# Patient Record
Sex: Male | Born: 1940
Health system: Southern US, Community
[De-identification: ages and names within clinical notes are randomized; demographics above are authoritative.]

## PROBLEM LIST (undated history)

## (undated) DIAGNOSIS — Z87442 Personal history of urinary calculi: Secondary | ICD-10-CM

## (undated) DIAGNOSIS — N189 Chronic kidney disease, unspecified: Secondary | ICD-10-CM

## (undated) DIAGNOSIS — Z5189 Encounter for other specified aftercare: Secondary | ICD-10-CM

## (undated) DIAGNOSIS — Z800 Family history of malignant neoplasm of digestive organs: Secondary | ICD-10-CM

## (undated) DIAGNOSIS — Z803 Family history of malignant neoplasm of breast: Secondary | ICD-10-CM

## (undated) DIAGNOSIS — D649 Anemia, unspecified: Secondary | ICD-10-CM

## (undated) DIAGNOSIS — Z806 Family history of leukemia: Secondary | ICD-10-CM

## (undated) DIAGNOSIS — Z809 Family history of malignant neoplasm, unspecified: Secondary | ICD-10-CM

## (undated) DIAGNOSIS — C189 Malignant neoplasm of colon, unspecified: Secondary | ICD-10-CM

## (undated) HISTORY — PX: TONSILLECTOMY: SHX5217

## (undated) HISTORY — DX: Malignant neoplasm of colon, unspecified: C18.9

## (undated) HISTORY — DX: Chronic kidney disease, unspecified: N18.9

## (undated) HISTORY — DX: Encounter for other specified aftercare: Z51.89

## (undated) HISTORY — PX: COLON SURGERY: SHX602

## (undated) HISTORY — PX: APPENDECTOMY: SHX54

## (undated) HISTORY — PX: CYST REMOVAL HAND: SHX6279

---
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
## 2019-02-05 ENCOUNTER — Telehealth: Payer: Self-pay | Admitting: Oncology

## 2019-02-05 NOTE — Telephone Encounter (Signed)
I received a staff msg for [name] to see Dr. Benay Spice. I cld and spoke to [name] granddaughter, Michiel Cowboy and scheduled him to see Dr. Benay Spice on 10/19 at 2pm. She's been made aware that her grandfather should arrive 76 minutes early.

## 2019-02-06 ENCOUNTER — Other Ambulatory Visit: Payer: Self-pay | Admitting: *Deleted

## 2019-02-06 ENCOUNTER — Ambulatory Visit
Admission: RE | Admit: 2019-02-06 | Discharge: 2019-02-06 | Disposition: A | Discharge status: Home / Self Care | Payer: Self-pay | Source: Ambulatory Visit | Attending: Oncology | Admitting: Oncology

## 2019-02-06 ENCOUNTER — Other Ambulatory Visit (HOSPITAL_COMMUNITY): Payer: Self-pay | Admitting: Oncology

## 2019-02-06 DIAGNOSIS — C801 Malignant (primary) neoplasm, unspecified: Secondary | ICD-10-CM

## 2019-02-06 DIAGNOSIS — C189 Malignant neoplasm of colon, unspecified: Secondary | ICD-10-CM

## 2019-02-06 NOTE — Progress Notes (Signed)
Took CD of her scan of abd/pelvis to radiology to be loaded into PACs system.

## 2019-02-10 ENCOUNTER — Inpatient Hospital Stay: Payer: Self-pay | Attending: Oncology | Admitting: Oncology

## 2019-02-10 ENCOUNTER — Other Ambulatory Visit: Payer: Self-pay

## 2019-02-10 ENCOUNTER — Telehealth: Payer: Self-pay | Admitting: Oncology

## 2019-02-10 ENCOUNTER — Inpatient Hospital Stay: Payer: Self-pay

## 2019-02-10 VITALS — BP 150/52 | HR 93 | Temp 98.7°F | Resp 17 | Ht 68.0 in | Wt 161.4 lb

## 2019-02-10 DIAGNOSIS — C187 Malignant neoplasm of sigmoid colon: Secondary | ICD-10-CM | POA: Insufficient documentation

## 2019-02-10 DIAGNOSIS — C189 Malignant neoplasm of colon, unspecified: Secondary | ICD-10-CM

## 2019-02-10 DIAGNOSIS — D509 Iron deficiency anemia, unspecified: Secondary | ICD-10-CM | POA: Insufficient documentation

## 2019-02-10 DIAGNOSIS — Z23 Encounter for immunization: Secondary | ICD-10-CM | POA: Insufficient documentation

## 2019-02-10 DIAGNOSIS — N400 Enlarged prostate without lower urinary tract symptoms: Secondary | ICD-10-CM | POA: Insufficient documentation

## 2019-02-10 DIAGNOSIS — Z800 Family history of malignant neoplasm of digestive organs: Secondary | ICD-10-CM | POA: Insufficient documentation

## 2019-02-10 LAB — CBC WITH DIFFERENTIAL (CANCER CENTER ONLY)
Abs Immature Granulocytes: 0.03 10*3/uL (ref 0.00–0.07)
Basophils Absolute: 0 10*3/uL (ref 0.0–0.1)
Basophils Relative: 1 %
Eosinophils Absolute: 0.2 10*3/uL (ref 0.0–0.5)
Eosinophils Relative: 3 %
HCT: 26.3 % — ABNORMAL LOW (ref 39.0–52.0)
Hemoglobin: 8.1 g/dL — ABNORMAL LOW (ref 13.0–17.0)
Immature Granulocytes: 0 %
Lymphocytes Relative: 23 %
Lymphs Abs: 1.7 10*3/uL (ref 0.7–4.0)
MCH: 25.2 pg — ABNORMAL LOW (ref 26.0–34.0)
MCHC: 30.8 g/dL (ref 30.0–36.0)
MCV: 81.9 fL (ref 80.0–100.0)
Monocytes Absolute: 0.8 10*3/uL (ref 0.1–1.0)
Monocytes Relative: 11 %
Neutro Abs: 4.7 10*3/uL (ref 1.7–7.7)
Neutrophils Relative %: 62 %
Platelet Count: 337 10*3/uL (ref 150–400)
RBC: 3.21 MIL/uL — ABNORMAL LOW (ref 4.22–5.81)
RDW: 16.5 % — ABNORMAL HIGH (ref 11.5–15.5)
WBC Count: 7.5 10*3/uL (ref 4.0–10.5)
nRBC: 0 % (ref 0.0–0.2)

## 2019-02-10 MED ORDER — INFLUENZA VAC A&B SA ADJ QUAD 0.5 ML IM PRSY
PREFILLED_SYRINGE | INTRAMUSCULAR | Status: AC
  Filled 2019-02-10: qty 0.5

## 2019-02-10 MED ORDER — INFLUENZA VAC A&B SA ADJ QUAD 0.5 ML IM PRSY
0.5000 mL | PREFILLED_SYRINGE | Freq: Once | INTRAMUSCULAR | Status: AC
  Administered 2019-02-10: 0.5 mL via INTRAMUSCULAR

## 2019-02-10 NOTE — Telephone Encounter (Signed)
Scheduled per 10/19 los, patient received after visit summary and calender. °

## 2019-02-10 NOTE — Progress Notes (Signed)
[name]   Requesting MD: Dr. Angela Adam   [name] 78 y.o.  05/09/1940    Reason for Consult: Colon cancer   HPI: [name] is here today with his English-speaking son and a Spanish interpreter.  He has relocated to [facility] to live with his son.  He reports helping fatigue January of this year.  He initially saw him.  He was referred to gastroenterology and was found to have a tumor at the sigmoid colon with "90% "obstruction.  He underwent a left hemicolectomy on 12/06/2018.  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  No polyps.  The tumor measured 4 cm.  Tumor invaded into the superficial portion of the muscularis.  No tumor perforation.  No perineural or vascular invasion.  Resection margins negative.  CTs of the abdomen pelvis on 11/22/2018 revealed groundglass density at the lung bases.  Asymmetric area of enhancement of a 6 cm segment of sigmoid colon.  No liver lesions.  Enlarged prostate.  Right inguinal hernia. A chest x-ray was normal on 08/14/2018.  He reports the dyspnea and fatigue have resolved.  He was evaluated by Dr. Toney Rakes in [place].  Dr. Toney Rakes recommends adjuvant capecitabine.  The patient and his son indicate this recommendation was based on the low number of sampled lymph nodes.   Past medical history: None  Past surgical history: Tonsillectomy at age 73 Appendectomy at age 38  Medications: Reviewed  Allergies: No Known Allergies  Family history: His mother died of pancreas cancer at age 90.  A paternal aunt had colon cancer.  No other family history of cancer.  Social History:   He is a retired Civil engineer, contracting.  He lives with his son in [place].  His wife is a smoker.  He reports social alcohol use.  ROS:   Positives include: Exertional dyspnea and general "weakness "prior to surgery, intermittent rectal bleeding prior to surgery,  dry skin  A complete ROS was otherwise for lymph nodes were negative for metastatic carcinoma.  Physical Exam:  Blood pressure (!) 150/52, pulse 93, temperature 98.7 F (37.1 C), temperature source Temporal, resp. rate 17, height 5\' 8"  (1.727 m), weight 161 lb 6.4 oz (73.2 kg), SpO2 100 %.  HEENT: Neck without mass Lungs: Clear bilaterally Cardiac: Regular rate and rhythm Abdomen: No hepatosplenomegaly, no mass, nontender, healed midline incision GU: Testes without mass Vascular: No leg edema Lymph nodes: No cervical, supraclavicular, axillary, or inguinal nodes Neurologic: Alert and oriented Skin: No rash    LAB:  CBC  Lab Results  Component Value Date   WBC 7.5 02/10/2019   HGB 8.1 (L) 02/10/2019   HCT 26.3 (L) 02/10/2019   MCV 81.9 02/10/2019   PLT 337 02/10/2019   NEUTROABS 4.7 02/10/2019  11/14/2018: Hemoglobin 8, MCV 75.2 11/24/2018: Hemoglobin 7.5 hematocrit 29.5%     Imaging: As per HPI   Assessment/Plan:   1. Sigmoid colon cancer, T2N0, stage I, status post a left hemicolectomy on 12/06/2018  Tumor invasive superficial portion of the muscle ("internal third "), no tumor perforation, no vascular invasion or perineural invasion, negative resection margins, 0/4 lymph nodes 2. Microcytic anemia secondary to #1  3.   Family history of pancreas and colon cancer   Disposition:   [name] has been diagnosed with stage I colon cancer.  He is now greater than 9 weeks out from surgery.  I discussed the prognosis and adjuvant treatment options with  [name] and his son.  He has a good prognosis for a long-term disease-free survival.  The tumor does not have high risk features other than the limited number lymph nodes sampled.  I do not recommend adjuvant systemic therapy.    [name] is comfortable with observation.  He has persistent anemia.  We will recommend iron therapy and ask him to return for a CBC in approximately 3 weeks.  We checked the CEA today.   He will return for an office visit and CEA in 4 months.  His history is not suggestive of hereditary nonpolyposis colon cancer syndrome, but his family members are at increased risk of developing colorectal cancer and should receive appropriate screening.  I will discuss peripheral blood Testing for HNPCC with the genetics counselor.  Betsy Coder, MD  02/10/2019, 5:12 PM

## 2019-02-11 ENCOUNTER — Other Ambulatory Visit (HOSPITAL_COMMUNITY): Payer: Self-pay | Admitting: Oncology

## 2019-02-11 ENCOUNTER — Other Ambulatory Visit: Payer: Self-pay | Admitting: *Deleted

## 2019-02-11 ENCOUNTER — Inpatient Hospital Stay
Admission: RE | Admit: 2019-02-11 | Discharge: 2019-02-11 | Disposition: A | Discharge status: Home / Self Care | Payer: Self-pay | Source: Ambulatory Visit | Attending: Oncology | Admitting: Oncology

## 2019-02-11 ENCOUNTER — Telehealth: Payer: Self-pay | Admitting: *Deleted

## 2019-02-11 ENCOUNTER — Ambulatory Visit
Admission: RE | Admit: 2019-02-11 | Discharge: 2019-02-11 | Disposition: A | Discharge status: Home / Self Care | Payer: Self-pay | Source: Ambulatory Visit | Attending: Oncology | Admitting: Oncology

## 2019-02-11 DIAGNOSIS — C189 Malignant neoplasm of colon, unspecified: Secondary | ICD-10-CM

## 2019-02-11 DIAGNOSIS — C801 Malignant (primary) neoplasm, unspecified: Secondary | ICD-10-CM

## 2019-02-11 LAB — CEA (IN HOUSE-CHCC): CEA (CHCC-In House): 9.62 ng/mL — ABNORMAL HIGH (ref 0.00–5.00)

## 2019-02-11 NOTE — Telephone Encounter (Signed)
Notified son of elevated CEA and informed him we have no prior result to compare. MD wants repeat CEA and BMP in 1 week--if still elevated, will order CT scan. Son understands and agrees. Appointment scheduled for 10/27 at 2pm.

## 2019-02-11 NOTE — Telephone Encounter (Signed)
-----   Message from Ladell Pier, MD sent at 02/11/2019 11:29 AM EDT ----- Please call son, cea is high, repeat 1 week with bmet, if high again will order CTs

## 2019-02-12 ENCOUNTER — Encounter: Payer: Self-pay | Admitting: Oncology

## 2019-02-12 ENCOUNTER — Other Ambulatory Visit: Payer: Self-pay | Admitting: Oncology

## 2019-02-12 DIAGNOSIS — C189 Malignant neoplasm of colon, unspecified: Secondary | ICD-10-CM

## 2019-02-12 NOTE — Progress Notes (Signed)
Called patient's son Jacqulyn Bath after receiving message from [employer] regarding uninsured concerns.  Introduced myself as Arboriculturist and to explain financial assistance. Advised all uninsured patients receive a 56% discount for all services billed through [facility] and if he would like to apply for an additional discount, he would need to complete a [facility] FAA and provide supporting documents. Advised I could mail an application as well as provide information on [facility] to obtain a PCP and apply for orange card. He was very Patent attorney.  If a treatment plan is established as far as chemotherapy, I will reach out to them at that time to discuss any other available resources as well as refer to Rob for drug replacement.  My card will also be provided in the packet mailed.

## 2019-02-14 ENCOUNTER — Telehealth: Payer: Self-pay | Admitting: Genetic Counselor

## 2019-02-14 NOTE — Telephone Encounter (Signed)
Called patient regarding upcoming appointment with interpreter, left a voicemail.

## 2019-02-17 ENCOUNTER — Other Ambulatory Visit: Payer: Self-pay | Admitting: Genetic Counselor

## 2019-02-17 DIAGNOSIS — C189 Malignant neoplasm of colon, unspecified: Secondary | ICD-10-CM

## 2019-02-17 NOTE — Progress Notes (Signed)
g

## 2019-02-18 ENCOUNTER — Inpatient Hospital Stay: Payer: Self-pay

## 2019-02-18 ENCOUNTER — Inpatient Hospital Stay (HOSPITAL_BASED_OUTPATIENT_CLINIC_OR_DEPARTMENT_OTHER): Payer: Self-pay | Admitting: Genetic Counselor

## 2019-02-18 ENCOUNTER — Encounter: Payer: Self-pay | Admitting: Genetic Counselor

## 2019-02-18 ENCOUNTER — Other Ambulatory Visit: Payer: Self-pay

## 2019-02-18 DIAGNOSIS — Z806 Family history of leukemia: Secondary | ICD-10-CM

## 2019-02-18 DIAGNOSIS — Z800 Family history of malignant neoplasm of digestive organs: Secondary | ICD-10-CM | POA: Insufficient documentation

## 2019-02-18 DIAGNOSIS — Z809 Family history of malignant neoplasm, unspecified: Secondary | ICD-10-CM | POA: Insufficient documentation

## 2019-02-18 DIAGNOSIS — D649 Anemia, unspecified: Secondary | ICD-10-CM

## 2019-02-18 DIAGNOSIS — C187 Malignant neoplasm of sigmoid colon: Secondary | ICD-10-CM

## 2019-02-18 DIAGNOSIS — Z803 Family history of malignant neoplasm of breast: Secondary | ICD-10-CM

## 2019-02-18 DIAGNOSIS — Z1379 Encounter for other screening for genetic and chromosomal anomalies: Secondary | ICD-10-CM

## 2019-02-18 DIAGNOSIS — C189 Malignant neoplasm of colon, unspecified: Secondary | ICD-10-CM

## 2019-02-18 LAB — BASIC METABOLIC PANEL - CANCER CENTER ONLY
Anion gap: 7 (ref 5–15)
BUN: 21 mg/dL (ref 8–23)
CO2: 25 mmol/L (ref 22–32)
Calcium: 8.8 mg/dL — ABNORMAL LOW (ref 8.9–10.3)
Chloride: 108 mmol/L (ref 98–111)
Creatinine: 1.07 mg/dL (ref 0.61–1.24)
GFR, Est AFR Am: >60 mL/min (ref >60)
GFR, Estimated: >60 mL/min (ref >60)
Glucose, Bld: 90 mg/dL (ref 70–99)
Potassium: 4.5 mmol/L (ref 3.5–5.1)
Sodium: 140 mmol/L (ref 135–145)

## 2019-02-18 LAB — CEA (IN HOUSE-CHCC): CEA (CHCC-In House): 11.41 ng/mL — ABNORMAL HIGH (ref 0.00–5.00)

## 2019-02-18 NOTE — Progress Notes (Signed)
REFERRING PROVIDER: Ladell Pier, [address],  [place] [address]  PRIMARY PROVIDER:  Patient, [name]  PRIMARY REASON FOR VISIT:  1. Cancer of sigmoid colon ([place])   2. Family history of colon cancer   3. Family history of leukemia   4. Family history of breast cancer   5. Family history of cancer      HISTORY OF PRESENT ILLNESS:   John Vance, a 78 y.o. male, was seen for a [facility] cancer genetics consultation at the request of Dr. Benay Spice due to a personal history of colon cancer and a family history of leukemia, colon cancer, and breast cancer.  John Vance presents to clinic today with his son and with a Spanish interpreter to discuss the possibility of a hereditary predisposition to cancer, genetic testing, and to further clarify his future cancer risks, as well as potential cancer risks for family members.   In 2020, at the age of 82, John Vance was diagnosed with moderately differentiated adenocarcinoma of the sigmoid colon. The treatment plan included a left hemicolectomy on 12/06/2018.   CANCER HISTORY:  Oncology History  Cancer of sigmoid colon ([place])  02/10/2019 Initial Diagnosis   Cancer of sigmoid colon ([place])   02/10/2019 Cancer Staging   Staging form: Colon and Rectum, AJCC 8th Edition - Pathologic: Stage I (pT2, pN0, cM0) - Signed by Ladell Pier, MD on 02/10/2019     Past Medical History:  Diagnosis Date  . Family history of breast cancer   . Family history of cancer   . Family history of colon cancer   . Family history of leukemia      Social History   Socioeconomic History  . Marital status: Married    Spouse name: Not on file  . Number of children: Not on file  . Years of education: Not on file  . Highest education level: Not on file  Occupational History  . Not on file  Social Needs  . Financial resource strain: Not on file  . Food insecurity    Worry: Not on file    Inability: Not on file   . Transportation needs    Medical: Not on file    Non-medical: Not on file  Tobacco Use  . Smoking status: Not on file  Substance and Sexual Activity  . Alcohol use: Not on file  . Drug use: Not on file  . Sexual activity: Not on file  Lifestyle  . Physical activity    Days per week: Not on file    Minutes per session: Not on file  . Stress: Not on file  Relationships  . Social Herbalist on phone: Not on file    Gets together: Not on file    Attends religious service: Not on file    Active member of club or organization: Not on file    Attends meetings of clubs or organizations: Not on file    Relationship status: Not on file  Other Topics Concern  . Not on file  Social History Narrative  . Not on file     FAMILY HISTORY:  We obtained a detailed, 4-generation family history.  Significant diagnoses are listed below: Family History  Problem Relation Age of Onset  . Breast cancer Mother 35       spread to pancreas  . Colon cancer Paternal Uncle 64  . Leukemia Sister 58       acute -  died 2 months after diagnosis  . Cancer Niece 37       unknown cancer of the nose     John Vance has five children - three sons ages 59, 67, and 49, and two daughters ages 40 and 59. None of his children have had cancer. He has five sisters and two brothers. One of his sisters died two months after she was diagnosed with leukemia at age 69. One of his brothers died at age 3 in a car accident. The rest of his siblings are alive and range in age from 16 to 25. One of his nieces has a nose tumor that is cancerous, although John Vance is not sure what kind of cancer this is.  John Vance mother died at age 3 from breast cancer that had spread to her pancreas. He has a maternal aunt and maternal uncle who died when they were older than 13, but he does not have much more information about them. His maternal grandparents died around the age of 43, and it is unknown if  they ever had cancer.  John Vance father died at the age of 7 during heart surgery. He has nine paternal aunts and two paternal uncles. Only one of these individuals are still living - an aunt who is 60. His other aunts and uncles all died older than 105, with one aunt living to 101 years. One of his uncles was diagnosed with colon cancer at the age of 58, and died at the age of 34. John Vance paternal grandparents died at the age of 52, and they did not have cancer. There are no other known diagnoses of cancer on the paternal side of the family.  John Vance is unaware of previous family history of genetic testing for hereditary cancer risks. Patient's maternal ancestors are of [place] descent, and paternal ancestors are of [place] descent. There is no reported Ashkenazi Jewish ancestry. There is no known consanguinity.  GENETIC COUNSELING ASSESSMENT: John Vance is a 78 y.o. male with a personal history of colon cancer and a family history of leukemia, breast cancer, and colon cancer, which is not highly suggestive of a hereditary cancer syndrome. We, therefore, discussed and recommended the following at today's visit.   DISCUSSION:  While most cases of colon cancer are sporadic, approximately 5 - 10% of colon cancer is hereditary with most cases associated with Lynch syndrome (a genetic syndrome characterized by an increased risk for colon cancer, endometrial cancer, and other cancers).  Other hereditary conditions that increase the risk for colon cancer may include classic/attenuated familial adenomatous polyposis and MUTYH-associated polyposis, among others.  We discussed that testing and identifying a hereditary cancer syndrome is beneficial for several reasons, including knowing about other cancer risks, identifying potential screening and risk-reduction options that may be appropriate, and to understand if other family members could be at risk for cancer and allow them  to undergo genetic testing.   We reviewed the characteristics, features and inheritance patterns of hereditary cancer syndromes. We also discussed genetic testing, including the appropriate family members to test, the process of testing, insurance coverage and turn-around-time for results. We discussed the implications of a negative, positive and/or variant of uncertain significant result.   We discussed with John Vance that his personal and family history does not meet insurance or NCCN criteria for genetic testing and is not highly consistent with a familial hereditary cancer syndrome.  We feel he is at  low risk to harbor a gene mutation associated with such a condition. However, John Vance still feels that genetic testing would provide valuable information about cancer risks for himself and his family members. Therefore, we recommended John Vance pursue genetic testing for the [facility] Common Hereditary Cancers panel.  The Common Hereditary Cancers Panel offered by [facility] includes sequencing and/or deletion duplication testing of the following 48 genes: APC, ATM, AXIN2, BARD1, BMPR1A, BRCA1, BRCA2, BRIP1, CDH1, CDK4, CDKN2A (p14ARF), CDKN2A (p16INK4a), CHEK2, CTNNA1, DICER1, EPCAM (Deletion/duplication testing only), GREM1 (promoter region deletion/duplication testing only), KIT, MEN1, MLH1, MSH2, MSH3, MSH6, MUTYH, NBN, NF1, NHTL1, PALB2, PDGFRA, PMS2, POLD1, POLE, PTEN, RAD50, RAD51C, RAD51D, RNF43, SDHB, SDHC, SDHD, SMAD4, SMARCA4. STK11, TP53, TSC1, TSC2, and VHL.  The following genes are evaluated for sequence changes only: SDHA and HOXB13 c.251G>A variant only.  The self-pay price for genetic testing will be $250.   PLAN: After considering the risks, benefits, and limitations, John Vance provided informed consent to pursue genetic testing and the blood sample was sent to [facility] for analysis of the Common Hereditary Cancers panel. Results should be available  within approximately two-three weeks' time, at which point they will be disclosed by telephone to John Vance, as will any additional recommendations warranted by these results. John Vance will receive a summary of his genetic counseling visit and a copy of his results once available. This information will also be available in Epic.   John Vance questions were answered to his satisfaction today. Our contact information was provided should additional questions or concerns arise. Thank you for the referral and allowing [place] to share in the care of your patient.   Clint Guy, MS, [facility] Certified Genetic Counselor [email or web address] Phone: [phone]  The patient was seen for a total of 55 minutes in face-to-face genetic counseling.  This patient was discussed with Drs. Magrinat, Lindi Adie and/or Burr Medico who agrees with the above.    _______________________________________________________________________ For Office Staff:  Number of people involved in session: 3 - John Vance son and a Spanish interpreter were also present. Was an Intern/ student involved with case: no

## 2019-02-19 ENCOUNTER — Telehealth: Payer: Self-pay | Admitting: *Deleted

## 2019-02-19 DIAGNOSIS — D649 Anemia, unspecified: Secondary | ICD-10-CM

## 2019-02-19 DIAGNOSIS — C187 Malignant neoplasm of sigmoid colon: Secondary | ICD-10-CM

## 2019-02-19 LAB — FERRITIN: Ferritin: 4 ng/mL — ABNORMAL LOW (ref 24–336)

## 2019-02-19 MED ORDER — FERROUS SULFATE 325 (65 FE) MG PO TBEC: 325.0000 mg | DELAYED_RELEASE_TABLET | Freq: Two times a day (BID) | ORAL | 3 refills | Status: DC

## 2019-02-19 NOTE — Telephone Encounter (Addendum)
Per Dr. Benay Spice: CEA still elevated. Needs CT C/A/P in next 2-3 weeks and f/u in office 1-2 days after scan. Notified son, Jacqulyn Bath and he agrees with the plan. Instructed him to come to office any time to p/u contrast and radiology will tell him when to drink. Son has also noted that Hgb was low at 8.1 and is asking what to do about this? Per Dr. Benay Spice: Looks like iron deficiency. Added ferritin to lab today. Start ferrous sulfate 325 mg twice daily. Will recheck CBC, smear and type and hold on day of CT scan. Orders placed and son notified of this and appointments for lab/scan/OV via VM and Mychart. Requested confirmation.

## 2019-02-27 DIAGNOSIS — Z1379 Encounter for other screening for genetic and chromosomal anomalies: Secondary | ICD-10-CM | POA: Insufficient documentation

## 2019-02-28 ENCOUNTER — Telehealth: Payer: Self-pay | Admitting: Genetic Counselor

## 2019-02-28 ENCOUNTER — Ambulatory Visit: Payer: Self-pay | Admitting: Genetic Counselor

## 2019-02-28 DIAGNOSIS — Z1379 Encounter for other screening for genetic and chromosomal anomalies: Secondary | ICD-10-CM

## 2019-02-28 NOTE — Telephone Encounter (Signed)
Revealed negative genetic testing to his daughter, Michiel Cowboy, through the use of a Spanish interpreter.  Discussed that we do not know why he has colon cancer or why there is cancer in the family.  These negative results may indicate that his colon cancer was sporadic.  It also could be due to a different gene that we are not testing, or our current technology may not be able detect something.  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  His results are still considered normal and should not impact his medical management.

## 2019-02-28 NOTE — Progress Notes (Addendum)
HPI:  [name] was previously seen in the [facility] due to a personal and family history of colon cancer and a family history of pancreatic cancer and leukemia, and concerns regarding a hereditary predisposition to cancer. Please refer to our prior cancer genetics clinic note for more information regarding our discussion, assessment and recommendations, at the time. [name] recent genetic test results were disclosed to him, as were recommendations warranted by these results. These results and recommendations are discussed in more detail below.  CANCER HISTORY:  Oncology History  Cancer of sigmoid colon ([place])  02/10/2019 Initial Diagnosis   Cancer of sigmoid colon ([place])   02/10/2019 Cancer Staging   Staging form: Colon and Rectum, AJCC 8th Edition - Pathologic: Stage I (pT2, pN0, cM0) - Signed by Ladell Pier, MD on 02/10/2019   02/27/2019 Genetic Testing   Negative genetic testing: no pathogenic variants detected on the [facility] Common Hereditary Cancers panel. A variant of uncertain significance was detected in the ATM gene, called c.1516G>T. The report date is 02/27/2019.  The Common Hereditary Cancers Panel offered by [facility] includes sequencing and/or deletion duplication testing of the following 48 genes: APC, ATM, AXIN2, BARD1, BMPR1A, BRCA1, BRCA2, BRIP1, CDH1, CDK4, CDKN2A (p14ARF), CDKN2A (p16INK4a), CHEK2, CTNNA1, DICER1, EPCAM (Deletion/duplication testing only), GREM1 (promoter region deletion/duplication testing only), KIT, MEN1, MLH1, MSH2, MSH3, MSH6, MUTYH, NBN, NF1, NHTL1, PALB2, PDGFRA, PMS2, POLD1, POLE, PTEN, RAD50, RAD51C, RAD51D, RNF43, SDHB, SDHC, SDHD, SMAD4, SMARCA4. STK11, TP53, TSC1, TSC2, and VHL.  The following genes were evaluated for sequence changes only: SDHA and HOXB13 c.251G>A variant only.      FAMILY HISTORY:  We obtained a detailed, 4-generation family history.  Significant diagnoses are listed below: Family  History  Problem Relation Age of Onset  . Breast cancer Mother 55       spread to pancreas  . Colon cancer Paternal Uncle 70  . Leukemia Sister 64       acute - died 2 months after diagnosis  . Cancer Niece 104       unknown cancer of the nose      [name] has five children - three sons ages 11, 36, and 59, and two daughters ages 47 and 56. None of his children have had cancer. He has five sisters and two brothers. One of his sisters died two months after she was diagnosed with leukemia at age 60. One of his brothers died at age 36 in a car accident. The rest of his siblings are alive and range in age from 41 to 5. One of his nieces has a nose tumor that is cancerous, although [name] is not sure what kind of cancer this is.  [name] mother died at age 45 from breast cancer that had spread to her pancreas. He has a maternal aunt and maternal uncle who died when they were older than 32, but he does not have much more information about them. His maternal grandparents died around the age of 20, and it is unknown if they ever had cancer.  [name] father died at the age of 91 during heart surgery. He has nine paternal aunts and two paternal uncles. Only one of these individuals are still living - an aunt who is 30. His other aunts and uncles all died older than 14, with one aunt living to 101 years. One of his uncles was diagnosed with colon cancer at the age of  65, and died at the age of 43. [name] paternal grandparents died at the age of 26, and they did not have cancer. There are no other known diagnoses of cancer on the paternal side of the family.  [name] is unaware of previous family history of genetic testing for hereditary cancer risks. Patient's maternal ancestors are of [place] descent, and paternal ancestors are of [place] descent. There is no reported Ashkenazi Jewish ancestry. There is no known  consanguinity.  GENETIC TEST RESULTS: Genetic testing reported out on 02/27/2019 through the [facility] Common Hereditary Cancers panel found no pathogenic variants. The Common Hereditary Cancers Panel offered by [facility] includes sequencing and/or deletion duplication testing of the following 48 genes: APC, ATM, AXIN2, BARD1, BMPR1A, BRCA1, BRCA2, BRIP1, CDH1, CDK4, CDKN2A (p14ARF), CDKN2A (p16INK4a), CHEK2, CTNNA1, DICER1, EPCAM (Deletion/duplication testing only), GREM1 (promoter region deletion/duplication testing only), KIT, MEN1, MLH1, MSH2, MSH3, MSH6, MUTYH, NBN, NF1, NHTL1, PALB2, PDGFRA, PMS2, POLD1, POLE, PTEN, RAD50, RAD51C, RAD51D, RNF43, SDHB, SDHC, SDHD, SMAD4, SMARCA4. STK11, TP53, TSC1, TSC2, and VHL.  The following genes were evaluated for sequence changes only: SDHA and HOXB13 c.251G>A variant only. The test report will be scanned into EPIC and located under the Molecular Pathology section of the Results Review tab.  A portion of the result report is included below for reference.     We discussed with [name] that because current genetic testing is not perfect, it is possible there may be a gene mutation in one of these genes that current testing cannot detect, but that chance is small.  We also discussed, that there could be another gene that has not yet been discovered, or that we have not yet tested, that is responsible for the cancer diagnoses in the family. It is also possible there is a hereditary cause for the cancer in the family that [name] did not inherit and therefore was not identified in his testing.  Therefore, it is important to remain in touch with cancer genetics in the future so that we can continue to offer [name] the most up to date genetic testing.   Genetic testing did identify a variant of uncertain significance (VUS) was identified in the ATM gene called c.1516G>T.  At this time, it is unknown if this variant is associated with increased  cancer risk or if this is a normal finding, but most variants such as this get reclassified to being inconsequential. It should not be used to make medical management decisions. With time, we suspect the lab will determine the significance of this variant, if any. If we do learn more about it, we will try to contact [name] to discuss it further. However, it is important to stay in touch with [place] periodically and keep the address and phone number up to date.  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   CANCER SCREENING RECOMMENDATIONS: [name] test result is considered negative (normal).  This means that we have not identified a hereditary cause for his personal and family history of cancer at this time. Most cancers happen by chance and this negative test suggests that his cancer may fall into this category.    While reassuring, this does not definitively rule out a hereditary predisposition to cancer. It is still possible that there could be genetic mutations that are  undetectable by current technology. There could be genetic mutations in genes that have not been tested or identified to increase cancer risk.  Therefore, it is recommended he continue to follow the cancer management and screening guidelines provided by his oncology and primary healthcare providers.   An individual's cancer risk and medical management are not determined by genetic test results alone. Overall cancer risk assessment incorporates additional factors, including personal medical history, family history, and any available genetic information that may result in a personalized plan for cancer prevention and surveillance.  RECOMMENDATIONS FOR FAMILY MEMBERS:  Individuals in this family might be at some increased risk of developing cancer, over the general population  risk, simply due to the family history of cancer.  We recommended women in this family have a yearly mammogram beginning at age 44, or 24 years younger than the earliest onset of cancer, an annual clinical breast exam, and perform monthly breast self-exams. Women in this family should also have a gynecological exam as recommended by their primary provider. All family members should have a colonoscopy by age 20.  FOLLOW-UP: Lastly, we discussed with [name] that cancer genetics is a rapidly advancing field and it is possible that new genetic tests will be appropriate for him and/or his family members in the future. We encouraged him to remain in contact with cancer genetics on an annual basis so we can update his personal and family histories and let him know of advances in cancer genetics that may benefit this family.   Our contact number was provided. [name] questions were answered to his satisfaction, and he knows he is welcome to call us at anytime with additional questions or concerns.   Clint Guy, MS, [facility] Certified Genetic Counselor [email or web address] Phone: [phone]

## 2019-03-05 ENCOUNTER — Other Ambulatory Visit: Payer: Self-pay

## 2019-03-05 ENCOUNTER — Inpatient Hospital Stay: Payer: Self-pay | Attending: Oncology

## 2019-03-05 ENCOUNTER — Ambulatory Visit (HOSPITAL_COMMUNITY)
Admission: RE | Admit: 2019-03-05 | Discharge: 2019-03-05 | Disposition: A | Discharge status: Home / Self Care | Payer: Self-pay | Source: Ambulatory Visit | Attending: Oncology | Admitting: Oncology

## 2019-03-05 ENCOUNTER — Other Ambulatory Visit: Payer: Self-pay | Admitting: *Deleted

## 2019-03-05 ENCOUNTER — Encounter (HOSPITAL_COMMUNITY): Payer: Self-pay | Admitting: Radiology

## 2019-03-05 ENCOUNTER — Encounter: Payer: Self-pay | Admitting: Genetic Counselor

## 2019-03-05 DIAGNOSIS — C187 Malignant neoplasm of sigmoid colon: Secondary | ICD-10-CM

## 2019-03-05 DIAGNOSIS — Z9049 Acquired absence of other specified parts of digestive tract: Secondary | ICD-10-CM | POA: Insufficient documentation

## 2019-03-05 DIAGNOSIS — D649 Anemia, unspecified: Secondary | ICD-10-CM

## 2019-03-05 DIAGNOSIS — Z79899 Other long term (current) drug therapy: Secondary | ICD-10-CM | POA: Insufficient documentation

## 2019-03-05 DIAGNOSIS — D509 Iron deficiency anemia, unspecified: Secondary | ICD-10-CM | POA: Insufficient documentation

## 2019-03-05 DIAGNOSIS — Z800 Family history of malignant neoplasm of digestive organs: Secondary | ICD-10-CM | POA: Insufficient documentation

## 2019-03-05 LAB — CBC WITH DIFFERENTIAL (CANCER CENTER ONLY)
Abs Immature Granulocytes: 0.04 10*3/uL (ref 0.00–0.07)
Basophils Absolute: 0 10*3/uL (ref 0.0–0.1)
Basophils Relative: 0 %
Eosinophils Absolute: 0.2 10*3/uL (ref 0.0–0.5)
Eosinophils Relative: 3 %
HCT: 32.1 % — ABNORMAL LOW (ref 39.0–52.0)
Hemoglobin: 9.4 g/dL — ABNORMAL LOW (ref 13.0–17.0)
Immature Granulocytes: 1 %
Lymphocytes Relative: 23 %
Lymphs Abs: 1.6 10*3/uL (ref 0.7–4.0)
MCH: 24.4 pg — ABNORMAL LOW (ref 26.0–34.0)
MCHC: 29.3 g/dL — ABNORMAL LOW (ref 30.0–36.0)
MCV: 83.2 fL (ref 80.0–100.0)
Monocytes Absolute: 0.9 10*3/uL (ref 0.1–1.0)
Monocytes Relative: 13 %
Neutro Abs: 4.2 10*3/uL (ref 1.7–7.7)
Neutrophils Relative %: 60 %
Platelet Count: 322 10*3/uL (ref 150–400)
RBC: 3.86 MIL/uL — ABNORMAL LOW (ref 4.22–5.81)
RDW: 20.7 % — ABNORMAL HIGH (ref 11.5–15.5)
WBC Count: 7 10*3/uL (ref 4.0–10.5)
nRBC: 0 % (ref 0.0–0.2)

## 2019-03-05 LAB — CMP (CANCER CENTER ONLY)
ALT: 12 U/L (ref 0–44)
AST: 16 U/L (ref 15–41)
Albumin: 3.6 g/dL (ref 3.5–5.0)
Alkaline Phosphatase: 75 U/L (ref 38–126)
Anion gap: 7 (ref 5–15)
BUN: 16 mg/dL (ref 8–23)
CO2: 27 mmol/L (ref 22–32)
Calcium: 8.8 mg/dL — ABNORMAL LOW (ref 8.9–10.3)
Chloride: 105 mmol/L (ref 98–111)
Creatinine: 0.88 mg/dL (ref 0.61–1.24)
GFR, Est AFR Am: >60 mL/min (ref >60)
GFR, Estimated: >60 mL/min (ref >60)
Glucose, Bld: 89 mg/dL (ref 70–99)
Potassium: 4.3 mmol/L (ref 3.5–5.1)
Sodium: 139 mmol/L (ref 135–145)
Total Bilirubin: 0.2 mg/dL — ABNORMAL LOW (ref 0.3–1.2)
Total Protein: 7 g/dL (ref 6.5–8.1)

## 2019-03-05 LAB — SAMPLE TO BLOOD BANK

## 2019-03-05 LAB — SAVE SMEAR(SSMR), FOR PROVIDER SLIDE REVIEW

## 2019-03-05 MED ORDER — SODIUM CHLORIDE (PF) 0.9 % IJ SOLN
INTRAMUSCULAR | Status: AC
  Filled 2019-03-05: qty 50

## 2019-03-05 MED ORDER — IOHEXOL 300 MG/ML  SOLN
100.0000 mL | Freq: Once | INTRAMUSCULAR | Status: AC | PRN
  Administered 2019-03-05: 100 mL via INTRAVENOUS

## 2019-03-06 ENCOUNTER — Encounter: Payer: Self-pay | Admitting: Nurse Practitioner

## 2019-03-06 ENCOUNTER — Telehealth: Payer: Self-pay

## 2019-03-06 NOTE — Telephone Encounter (Signed)
-----   Message from Milus Banister, MD sent at 03/06/2019  8:07 AM EST ----- Leroy Sea, thanks. We'll expedite this.   John Vance, This spanish speaking only man needs OV tomorrow next week with myself or any extender.  For personal history of colon cancer, likely needs colonoscopy.  Thanks  ----- Message ----- From: Ladell Pier, MD Sent: 03/06/2019   7:43 AM EST To: Milus Banister, MD  This is the guy I mentioned this morning.  I am seeing him tomorrow. Had a left colectomy in August for a stage 2 cancer. Now with iron deficiency anemia and appears to have cecal and transverse colon masses on CT  Needs a colonoscopy.  Contact is his son.  Thanks,  [employer]

## 2019-03-06 NOTE — Telephone Encounter (Signed)
First available consult scheduled on 11/24 at 10:30am with [facility].

## 2019-03-06 NOTE — Telephone Encounter (Signed)
Tye Maryland do you mind calling this pt for me and getting him an appointment for personal history of colon cancer with any extender or Dr Ardis Hughs next week or tomorrow if available.  He is spanish speaking.  Thank you

## 2019-03-06 NOTE — Telephone Encounter (Signed)
Dr Ardis Hughs this is the first available is this ok?

## 2019-03-06 NOTE — Telephone Encounter (Signed)
Yes, thanks

## 2019-03-07 ENCOUNTER — Other Ambulatory Visit: Payer: Self-pay

## 2019-03-07 ENCOUNTER — Telehealth: Payer: Self-pay

## 2019-03-07 ENCOUNTER — Inpatient Hospital Stay (HOSPITAL_BASED_OUTPATIENT_CLINIC_OR_DEPARTMENT_OTHER): Payer: Self-pay | Admitting: Oncology

## 2019-03-07 ENCOUNTER — Telehealth: Payer: Self-pay | Admitting: Oncology

## 2019-03-07 VITALS — BP 141/61 | HR 78 | Temp 98.6°F | Resp 18 | Ht 68.0 in | Wt 165.2 lb

## 2019-03-07 DIAGNOSIS — C187 Malignant neoplasm of sigmoid colon: Secondary | ICD-10-CM

## 2019-03-07 DIAGNOSIS — D649 Anemia, unspecified: Secondary | ICD-10-CM

## 2019-03-07 NOTE — Telephone Encounter (Signed)
Ok, forget that plan.  Lets stick with the original plan for extender visit next week or the week after.  Can book colonoscopy from that appt.

## 2019-03-07 NOTE — Telephone Encounter (Signed)
Milus Banister, MD  Timothy Lasso, RN        I also have some [place] openings next week if those work better for him than 7:30AM on the 25th (just realized). Thanks

## 2019-03-07 NOTE — Telephone Encounter (Signed)
John Vance can you please schedule this pt for a colon on 11/25 and pre visit.

## 2019-03-07 NOTE — Telephone Encounter (Signed)
Scheduled per 11/13 los, patient is aware of appointments.

## 2019-03-07 NOTE — Telephone Encounter (Signed)
Dr Ardis Hughs our first available pre visit is not until 04/02/19.

## 2019-03-07 NOTE — Telephone Encounter (Signed)
John Vance, the first available pre-visit is on 12/9 considering that pt needs an interpreter so a 60 minutes spot is needed.

## 2019-03-07 NOTE — Telephone Encounter (Signed)
Noted  

## 2019-03-07 NOTE — Progress Notes (Signed)
[facility] OFFICE PROGRESS NOTE   Diagnosis: Colon cancer, iron deficiency anemia  INTERVAL HISTORY:   [name] returns for a scheduled visit.  He is here today with his son and a Spanish interpreter.  He feels well.  No difficulty with bowel function.  No bleeding.  No complaint.  He is taking iron.  He was here on 02/10/2019 the hemoglobin returned low and the CEA was elevated.  Objective:  Vital signs in last 24 hours:  Blood pressure (!) 141/61, pulse 78, temperature 98.6 F (37 C), temperature source Temporal, resp. rate 18, height _0  (1.727 m), weight 165 lb 3.2 oz (74.9 kg), SpO2 100 %.   Physical examination-not performed today  Lab Results:  Lab Results  Component Value Date   WBC 7.0 03/05/2019   HGB 9.4 (L) 03/05/2019   HCT 32.1 (L) 03/05/2019   MCV 83.2 03/05/2019   PLT 322 03/05/2019   NEUTROABS 4.2 03/05/2019    CMP  Lab Results  Component Value Date   NA 139 03/05/2019   K 4.3 03/05/2019   CL 105 03/05/2019   CO2 27 03/05/2019   GLUCOSE 89 03/05/2019   BUN 16 03/05/2019   CREATININE 0.88 03/05/2019   CALCIUM 8.8 (L) 03/05/2019   PROT 7.0 03/05/2019   ALBUMIN 3.6 03/05/2019   AST 16 03/05/2019   ALT 12 03/05/2019   ALKPHOS 75 03/05/2019   BILITOT 0.2 (L) 03/05/2019   GFRNONAA >60 03/05/2019   GFRAA >60 03/05/2019    Lab Results  Component Value Date   CEA1 11.41 (H) 02/18/2019  Ferritin on 02/18/2019:4    Imaging:  Ct Chest W Contrast  Result Date: 03/05/2019 CLINICAL DATA:  Sigmoid colon cancer diagnosed August 2020 status post left hemicolectomy 12/06/2018. Rising CEA. Restaging. EXAM: CT CHEST, ABDOMEN, AND PELVIS WITH CONTRAST TECHNIQUE: Multidetector CT imaging of the chest, abdomen and pelvis was performed following the standard protocol during bolus administration of intravenous contrast. CONTRAST:  122m OMNIPAQUE IOHEXOL 300 MG/ML  SOLN COMPARISON:  Outside CT abdomen/pelvis dated 11/22/2018 and outside chest  radiograph dated 08/14/2018. FINDINGS: CT CHEST FINDINGS Cardiovascular: Normal heart size. No significant pericardial effusion/thickening. Three-vessel coronary atherosclerosis. Atherosclerotic nonaneurysmal thoracic aorta. Normal caliber pulmonary arteries. No central pulmonary emboli. Mediastinum/Nodes: No discrete thyroid nodules. Unremarkable esophagus. No pathologically enlarged axillary, mediastinal or hilar lymph nodes. Lungs/Pleura: No pneumothorax. No pleural effusion. No acute consolidative airspace disease, lung masses or significant pulmonary nodules. Subcentimeter calcified right middle lobe granuloma. Musculoskeletal: No aggressive appearing focal osseous lesions. Moderate thoracic spondylosis. CT ABDOMEN PELVIS FINDINGS Hepatobiliary: Normal liver size. Subcentimeter hypodense right liver dome lesion near the IVC is too small to characterize and is unchanged since 11/22/2018 CT and probably benign. No new liver lesions. Tiny gallstones within the otherwise normal gallbladder. No biliary ductal dilatation. Pancreas: Normal, with no mass or duct dilation. Spleen: Normal size. No mass. Adrenals/Urinary Tract: Normal adrenals. No hydronephrosis. Simple left renal cysts, largest 7.8 cm in the interpolar left kidney. Additional subcentimeter hypodense renal cortical lesions in both kidneys are too small to characterize and require no follow-up. Mild diffuse bladder wall thickening, stable. Stomach/Bowel: Small hiatal hernia. Otherwise normal nondistended stomach. Normal caliber small bowel with no small bowel wall thickening. Appendectomy. Oral contrast transits to the rectum. There is irregular masslike wall thickening in the cecum measuring 4.1 x 2.9 cm (series 2/image 93), increased from 3.4 x 2.7 cm on 11/22/2018 CT. There is an apple-core lesion in the mid transverse colon measuring 3.7  cm in length and 3.1 cm in width (series 5/image 28), similar to prior CT. There is dilatation of the right colon up  to 6.9 cm diameter. Status post subtotal distal colectomy with intact appearing distal colonic anastomosis. Minimal diverticulosis in the residual sigmoid colon. Vascular/Lymphatic: Atherosclerotic nonaneurysmal abdominal aorta. Patent portal, splenic, hepatic and renal veins. No pathologically enlarged lymph nodes in the abdomen or pelvis. Reproductive: Mildly enlarged prostate. Other: No pneumoperitoneum, ascites or focal fluid collection. Musculoskeletal: No aggressive appearing focal osseous lesions. IMPRESSION: 1. Irregular masslike wall thickening in the cecum is worsened since outside CT of 11/22/2018, suspicious for metachronous primary cecal malignancy. 2. Apple-core lesion in the mid transverse colon is similar to prior outside CT, also suspicious for metachronous primary colonic malignancy. Dilatation of the right colon without evidence of obstruction. 3. No evidence of recurrent tumor at the distal colonic anastomosis. 4. No findings of metastatic disease in the chest, abdomen or pelvis. 5. Chronic findings include: Aortic Atherosclerosis (ICD10-I70.0). Three-vessel coronary atherosclerosis. Cholelithiasis. Small hiatal hernia. Mild prostatomegaly. Electronically Signed   By: Ilona Sorrel M.D.   On: 03/05/2019 15:17   Ct Abdomen Pelvis W Contrast  Result Date: 03/05/2019 CLINICAL DATA:  Sigmoid colon cancer diagnosed August 2020 status post left hemicolectomy 12/06/2018. Rising CEA. Restaging. EXAM: CT CHEST, ABDOMEN, AND PELVIS WITH CONTRAST TECHNIQUE: Multidetector CT imaging of the chest, abdomen and pelvis was performed following the standard protocol during bolus administration of intravenous contrast. CONTRAST:  132m OMNIPAQUE IOHEXOL 300 MG/ML  SOLN COMPARISON:  Outside CT abdomen/pelvis dated 11/22/2018 and outside chest radiograph dated 08/14/2018. FINDINGS: CT CHEST FINDINGS Cardiovascular: Normal heart size. No significant pericardial effusion/thickening. Three-vessel coronary  atherosclerosis. Atherosclerotic nonaneurysmal thoracic aorta. Normal caliber pulmonary arteries. No central pulmonary emboli. Mediastinum/Nodes: No discrete thyroid nodules. Unremarkable esophagus. No pathologically enlarged axillary, mediastinal or hilar lymph nodes. Lungs/Pleura: No pneumothorax. No pleural effusion. No acute consolidative airspace disease, lung masses or significant pulmonary nodules. Subcentimeter calcified right middle lobe granuloma. Musculoskeletal: No aggressive appearing focal osseous lesions. Moderate thoracic spondylosis. CT ABDOMEN PELVIS FINDINGS Hepatobiliary: Normal liver size. Subcentimeter hypodense right liver dome lesion near the IVC is too small to characterize and is unchanged since 11/22/2018 CT and probably benign. No new liver lesions. Tiny gallstones within the otherwise normal gallbladder. No biliary ductal dilatation. Pancreas: Normal, with no mass or duct dilation. Spleen: Normal size. No mass. Adrenals/Urinary Tract: Normal adrenals. No hydronephrosis. Simple left renal cysts, largest 7.8 cm in the interpolar left kidney. Additional subcentimeter hypodense renal cortical lesions in both kidneys are too small to characterize and require no follow-up. Mild diffuse bladder wall thickening, stable. Stomach/Bowel: Small hiatal hernia. Otherwise normal nondistended stomach. Normal caliber small bowel with no small bowel wall thickening. Appendectomy. Oral contrast transits to the rectum. There is irregular masslike wall thickening in the cecum measuring 4.1 x 2.9 cm (series 2/image 93), increased from 3.4 x 2.7 cm on 11/22/2018 CT. There is an apple-core lesion in the mid transverse colon measuring 3.7 cm in length and 3.1 cm in width (series 5/image 28), similar to prior CT. There is dilatation of the right colon up to 6.9 cm diameter. Status post subtotal distal colectomy with intact appearing distal colonic anastomosis. Minimal diverticulosis in the residual sigmoid colon.  Vascular/Lymphatic: Atherosclerotic nonaneurysmal abdominal aorta. Patent portal, splenic, hepatic and renal veins. No pathologically enlarged lymph nodes in the abdomen or pelvis. Reproductive: Mildly enlarged prostate. Other: No pneumoperitoneum, ascites or focal fluid collection. Musculoskeletal: No aggressive appearing focal osseous lesions.  IMPRESSION: 1. Irregular masslike wall thickening in the cecum is worsened since outside CT of 11/22/2018, suspicious for metachronous primary cecal malignancy. 2. Apple-core lesion in the mid transverse colon is similar to prior outside CT, also suspicious for metachronous primary colonic malignancy. Dilatation of the right colon without evidence of obstruction. 3. No evidence of recurrent tumor at the distal colonic anastomosis. 4. No findings of metastatic disease in the chest, abdomen or pelvis. 5. Chronic findings include: Aortic Atherosclerosis (ICD10-I70.0). Three-vessel coronary atherosclerosis. Cholelithiasis. Small hiatal hernia. Mild prostatomegaly. Electronically Signed   By: Ilona Sorrel M.D.   On: 03/05/2019 15:17    Medications: I have reviewed the patient's current medications.   Assessment/Plan: 1. Sigmoid colon cancer, T2N0, stage I, status post a left hemicolectomy on 12/06/2018  Tumor invasive superficial portion of the muscle ("internal third "), no tumor perforation, no vascular invasion or perineural invasion, negative resection margins, 0/4 lymph nodes  Elevated CEA 02/10/2019  CTs 03/05/2019, compared to outside CTs from 11/22/2018-worsened wall thickening in the cecum stable apple core lesion in the mid transverse colon, no evidence of recurrent tumor at the distal colonic anastomosis, no evidence of metastatic disease 2. Microcytic anemia secondary to #1  3.   Family history of pancreas and colon cancer, negative genetic testing per the [facility] panel, ATM variant of unknown significance    Disposition: [name] was diagnosed  with stage I colon cancer in August when he underwent a left colectomy.  He had iron deficiency anemia and the CEA was elevated when I saw him in October.  He appears to have had synchronous colon primaries when he was diagnosed this summer.  The differential diagnosis includes large polyps.  I discussed the CT findings and treatment options with [name] and his son.  He is scheduled for a colonoscopy with Dr. Ardis Hughs.  I will make a surgical referral.  He understands a recommendation for additional treatment will be based on the pathology from biopsy/resection of the colon masses.  He will continue iron therapy.  He will return for an office visit after the colonoscopy.  Betsy Coder, MD  03/07/2019  2:53 PM

## 2019-03-07 NOTE — Telephone Encounter (Signed)
-----   Message from Milus Banister, MD sent at 03/06/2019  8:13 PM EST ----- Missy Baksh The more I think about this, the more I think it will be best to expedite his care with a direct colonoscopy.  I have a 7:30 opening on the Wednesday the 25th just before thanksgiving.  Can you see about a previsit next week and colonoscopy in [place] at that Wednesday 25th spot?  Thanks  DJ  ----- Message ----- From: Milus Banister, MD Sent: 03/06/2019   8:07 AM EST To: Timothy Lasso, RN, Ladell Pier, MD  Brad, thanks. We'll expedite this.   Tris Howell, This spanish speaking only man needs OV tomorrow next week with myself or any extender.  For personal history of colon cancer, likely needs colonoscopy.  Thanks  ----- Message ----- From: Ladell Pier, MD Sent: 03/06/2019   7:43 AM EST To: Milus Banister, MD  This is the guy I mentioned this morning.  I am seeing him tomorrow. Had a left colectomy in August for a stage 2 cancer. Now with iron deficiency anemia and appears to have cecal and transverse colon masses on CT  Needs a colonoscopy.  Contact is his son.  Thanks,  [employer]

## 2019-03-10 DIAGNOSIS — C187 Malignant neoplasm of sigmoid colon: Secondary | ICD-10-CM

## 2019-03-11 ENCOUNTER — Ambulatory Visit: Payer: Self-pay | Admitting: Nurse Practitioner

## 2019-03-11 ENCOUNTER — Encounter: Payer: Self-pay | Admitting: Nurse Practitioner

## 2019-03-11 ENCOUNTER — Other Ambulatory Visit: Payer: Self-pay

## 2019-03-11 VITALS — BP 142/72 | HR 78 | Temp 98.1°F | Ht 68.0 in | Wt 162.0 lb

## 2019-03-11 DIAGNOSIS — C187 Malignant neoplasm of sigmoid colon: Secondary | ICD-10-CM

## 2019-03-11 DIAGNOSIS — Z1159 Encounter for screening for other viral diseases: Secondary | ICD-10-CM

## 2019-03-11 NOTE — Patient Instructions (Addendum)
If you are age 80 or older, your body mass index should be between 23-30. Your Body mass index is 24.63 kg/m. If this is out of the aforementioned range listed, please consider follow up with your Primary Care Provider.  If you are age 70 or younger, your body mass index should be between 19-25. Your Body mass index is 24.63 kg/m. If this is out of the aformentioned range listed, please consider follow up with your Primary Care Provider.   Due to recent COVID-19 restrictions implemented by [employer] and state authorities and in an effort to keep both patients and staff as safe as possible, [place] requires COVID-19 testing prior to any scheduled endoscopic procedure. The testing center is located at [address] [place], [place] [address] in the [facility] [employer]  suite.  Your appointment has been scheduled for 03-19-2019 at 2:05pm. Please bring your insurance cards to this appointment. You will require your COVID screen 2 business days prior to your endoscopic procedure.  You are not required to quarantine after your screening.  You will only receive a phone call with the results if it is POSITIVE.  If you do not receive a call the day before your procedure you should begin your prep, if ordered, and you should report to the endo center for your procedure at your designated appointment arrival time ( one hour prior to the procedure time). There is no cost to you for the screening on the day of the swab.  [facility] will file with your insurance company for the testing.    You may receive an automated phone call prior to your procedure or have a message in your MyChart that you have an [facility], please disregard this message.  Your testing will be at the [place] [place] location.   If you are leaving [facility]  travel [address] on [address], turn left onto [address], turn night onto [address] at the 1st stop light turn right, pass the [employer] on your right and proceed to [place] (white building).   It was a pleasure to see you today!

## 2019-03-11 NOTE — Progress Notes (Signed)
03/11/2019 John Vance [address] John Vance [id number] 1940/10/15   HISTORY OF PRESENT ILLNESS: [name].  John Vance is a very pleasant 78 year old male who presents today accompanied by his son as well as an interpreter to schedule a colonoscopy.  He was previously living in [place].  He developed fatigue and shortness of breath July 2020.  He was initially referred to a pulmonologist who prescribed a prednisone taper.  His shortness of breath did not improve.  He underwent laboratory studies which identified iron deficiency anemia.  An abdominal/pelvic CAT scan identified a colon mass.  He underwent a colonoscopy 10/2018 in [place] which identified a partial sigmoid obstruction secondary to a 6 cm mass at 30 cm.  He underwent a segmental colectomy 12/06/2018.  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  He left [place] and traveled to [place] to be with his son.  He underwent a consult with oncologist Dr. Benay Spice.  A CT scan of his chest, abdomen and pelvis identified masses in the transverse colon and cecum.  No evidence of metastatic disease. Our [facility] radiologist reviewed the [place] from [place] and the masses to the transverse colon and cecum were also seen on his prior CT. He was evaluated by surgeon Dr. Nadeen Landau on 03/11/2019.  A right hemicolectomy was discussed, however there was concern regarding the patency of his IMA and if he had sufficient collateral circulation.  Further plans for surgical intervention will be determined after he completes his colonoscopy.  He is taking Ferrous Sulfate 325 mg once daily.  He denies having any upper or lower abdominal pain.  No rectal bleeding or melena.  No GERD symptoms.  No fever, sweats or chills.  No weight loss.  No known family history of colorectal cancer.  He quit smoking 30 years ago.  He occasionally drinks one half bottle of scotch every few weekends.  CBC  Latest Ref Rng & Units 03/05/2019 02/10/2019  WBC 4.0 - 10.5 K/uL 7.0 7.5  Hemoglobin 13.0 - 17.0 g/dL 9.4(L) 8.1(L)  Hematocrit 39.0 - 52.0 % 32.1(L) 26.3(L)  Platelets 150 - 400 K/uL 322 337    Chest/Abdominal/Pelvic CT with contrast 03/05/2019: 1. Irregular masslike wall thickening in the cecum is worsened since outside CT of 11/22/2018, suspicious for metachronous primary cecal malignancy. 2. Apple-core lesion in the mid transverse colon is similar to prior outside CT, also suspicious for metachronous primary colonic malignancy. Dilatation of the right colon without evidence of obstruction. 3. No evidence of recurrent tumor at the distal colonic anastomosis. 4. No findings of metastatic disease in the chest, abdomen or pelvis. 5. Chronic findings include: Aortic Atherosclerosis (ICD10-I70.0). Three-vessel coronary atherosclerosis. Cholelithiasis. Small hiatal hernia. Mild prostatomegaly   Past Medical History:  Diagnosis Date  . Colon cancer [facility])    Past Surgical History:  Procedure Laterality Date  . APPENDECTOMY    . TONSILLECTOMY      reports that he has never smoked. He has never used smokeless tobacco. He reports current alcohol use. He reports that he does not use drugs. family history includes Breast cancer (age of onset: 93) in his mother; Cancer (age of onset: 74) in his niece; Colon cancer (age of onset: 49) in his paternal uncle; Leukemia (age of onset: 32) in his sister; Pancreatic cancer in his mother. No Known Allergies    Outpatient Encounter Medications as of 03/11/2019  Medication Sig  . Ascorbic Acid (VITAMIN C) 1000 MG tablet Take 1,000 mg by  mouth daily.  . ferrous sulfate 325 (65 FE) MG EC tablet Take 1 tablet (325 mg total) by mouth 2 (two) times daily with a meal.  . FOLIC ACID PO Take [id number] mcg by mouth daily.  . Multiple Vitamins-Minerals (MULTIVITAMIN ADULT PO) Take 1 tablet by mouth daily.  . predniSONE (DELTASONE) 5 MG tablet Take 5 mg by  mouth daily with breakfast.   No facility-administered encounter medications on file as of 03/11/2019.      REVIEW OF SYSTEMS  : All other systems reviewed and negative except where noted in the History of Present Illness.   PHYSICAL EXAM: BP (!) 142/72 (BP Location: Left Arm, Patient Position: Sitting)   Pulse 78   Temp 98.1 F (36.7 C)   Ht 5\' 8"  (1.727 m)   Wt 162 lb (73.5 kg)   SpO2 96%   BMI 24.63 kg/m  General: 78 year old male alert in no acute distress Head: Normocephalic and atraumatic Eyes:  sclerae anicteric,conjunctive pink Ears: Normal auditory acuity Neck: Supple, no masses Mouth: Few missing dentition, no ulcers or lesions Lungs: Clear throughout to auscultation Heart: Regular rate and rhythm Abdomen: Soft, nontender, non distended. No masses or hepatomegaly noted. Normal bowel sounds x4 quadrants.  Midline abdominal scar intact. Rectal: Deffered.  Musculoskeletal: Symmetrical with no gross deformities  Skin: No lesions on visible extremities Extremities: No edema  Neurological: Alert oriented x 4, grossly nonfocal Cervical Nodes:  No significant cervical adenopathy Psychological:  Alert and cooperative. Normal mood and affect  ASSESSMENT AND PLAN:  67. 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 Patient evaluated by oncologist, Dr. Learta Codding, who requested a colonoscopy prior to initiating chemotherapy and prior to proceeding with any further colon resection.  -colonoscopy benefits and risks discussed  Including risks with sedation, risk of bleeding, perforation and infection  -further follow up to be determined after colonoscopy completed   2.  Iron deficiency anemia secondary to colon cancer -Patient to continue ferrous sulfate 325 mg once daily -Continue follow-up with oncologist Dr. Learta Codding  CC:   Benay Spice Izola Price, MD

## 2019-03-12 ENCOUNTER — Telehealth: Payer: Self-pay | Admitting: Oncology

## 2019-03-12 NOTE — Telephone Encounter (Signed)
Returned patient's phone call regarding rescheduling an appointment, per patient's request 11/30 appointment has moved to 12/01.

## 2019-03-12 NOTE — Progress Notes (Signed)
THanks for seeing him.  I agree with the above note, plan

## 2019-03-18 ENCOUNTER — Ambulatory Visit: Payer: Self-pay | Admitting: Nurse Practitioner

## 2019-03-19 ENCOUNTER — Other Ambulatory Visit: Payer: Self-pay | Admitting: Gastroenterology

## 2019-03-21 LAB — SARS CORONAVIRUS 2 (TAT 6-24 HRS): SARS Coronavirus 2: NEGATIVE

## 2019-03-24 ENCOUNTER — Ambulatory Visit: Payer: Self-pay | Admitting: Nurse Practitioner

## 2019-03-24 ENCOUNTER — Encounter: Payer: Self-pay | Admitting: Gastroenterology

## 2019-03-24 ENCOUNTER — Other Ambulatory Visit: Payer: Self-pay

## 2019-03-24 ENCOUNTER — Ambulatory Visit (AMBULATORY_SURGERY_CENTER): Payer: Self-pay | Admitting: Gastroenterology

## 2019-03-24 VITALS — BP 103/55 | HR 79 | Temp 98.5°F | Resp 13 | Ht 68.0 in | Wt 162.0 lb

## 2019-03-24 DIAGNOSIS — D124 Benign neoplasm of descending colon: Secondary | ICD-10-CM

## 2019-03-24 DIAGNOSIS — C180 Malignant neoplasm of cecum: Secondary | ICD-10-CM

## 2019-03-24 DIAGNOSIS — C184 Malignant neoplasm of transverse colon: Secondary | ICD-10-CM

## 2019-03-24 DIAGNOSIS — C187 Malignant neoplasm of sigmoid colon: Secondary | ICD-10-CM

## 2019-03-24 DIAGNOSIS — D125 Benign neoplasm of sigmoid colon: Secondary | ICD-10-CM

## 2019-03-24 DIAGNOSIS — Z85038 Personal history of other malignant neoplasm of large intestine: Secondary | ICD-10-CM

## 2019-03-24 MED ORDER — SODIUM CHLORIDE 0.9 % IV SOLN: 500.0000 mL | Freq: Once | INTRAVENOUS | Status: DC

## 2019-03-24 NOTE — Progress Notes (Signed)
To PACU, VSS. Report to [email or web address].tb 

## 2019-03-24 NOTE — Progress Notes (Signed)
VS done by CW. Temp by JB. Interpreter Clovis Riley present during admission.

## 2019-03-24 NOTE — Progress Notes (Signed)
Called to room to assist during endoscopic procedure.  Patient ID and intended procedure confirmed with present staff. Received instructions for my participation in the procedure from the performing physician.  

## 2019-03-24 NOTE — Op Note (Signed)
[place] Patient Name: John Vance Procedure Date: 03/24/2019 1:41 PM MRN: [id number] Endoscopist: Milus Banister , MD Age: 78 Referring MD:  Date of Birth: 1940/12/07 Gender: Male Account #: [id number] Procedure:                Colonoscopy Indications:              High risk colon cancer surveillance: Personal                            history of colon cancer; Left (segmental) colectomy                            [place] 11/2018; rising CEA, abnormal CT scan in                            [place] Medicines:                Monitored Anesthesia Care Procedure:                Pre-Anesthesia Assessment:                           - Prior to the procedure, a History and Physical                            was performed, and patient medications and                            allergies were reviewed. The patient's tolerance of                            previous anesthesia was also reviewed. The risks                            and benefits of the procedure and the sedation                            options and risks were discussed with the patient.                            All questions were answered, and informed consent                            was obtained. Prior Anticoagulants: The patient has                            taken no previous anticoagulant or antiplatelet                            agents. ASA Grade Assessment: II - A patient with                            mild systemic disease. After reviewing the risks  and benefits, the patient was deemed in                            satisfactory condition to undergo the procedure.                           After obtaining informed consent, the colonoscope                            was passed under direct vision. Throughout the                            procedure, the patient's blood pressure, pulse, and                            oxygen saturations were monitored continuously. The                             Colonoscope was introduced through the anus and                            advanced to the the cecum, identified the ileocecal                            valve. The colonoscopy was performed without                            difficulty. The patient tolerated the procedure                            well. The quality of the bowel preparation was                            good. The ileocecal valve and rectum were                            photographed. Scope In: 1:47:08 PM Scope Out: 2:20:50 PM Scope Withdrawal Time: 0 hours 29 minutes 10 seconds  Total Procedure Duration: 0 hours 33 minutes 42 seconds  Findings:                 Two malignant appearing masses were noted in the                            colon. One filled the cecum (biopsied, path jar 1),                            it was approximately 4cm across and friable. The                            other malignant appearing mass was circumferential,                            creating a stricture with a 1.2cm lumen, located in  the transverse segment, approximately 4-5cm in                            length (biopsied, path jar 2). The distal edge of                            the transverse colon mass was labeled with                            submucosal injection of Spot.                           There were 12-18 neoplastic appearing polyps in                            between the two obvious malignant masses ranging in                            size from 4mm to 2cm. None of these were removed.                           There were five polyps distal to the transverese                            colon mass which I removed. Three polyps were in                            the descending segment, the largest was 1.5cm and                            it was removed with snare/cautery. The other two                            descending colon polyps were 6-34mm across, sessile,                             removed with cold snare. All of the descending                            polyp were sent to pathology (jar 3). There were                            two sigmoid colon polyps. The largest was located                            at 20cm from the anus, about 5cm distal to the                            colo-colonic anastomosis. This polyp was 1.4cm,                            removed with cold snare. The site bled more than is  usual and so I placed a single endcoclip at the                            site resulting in immediate hemostasis. The other                            sigmoid polyp was 15mm across and removed with cold                            snare. Both sigmoid polyp were retrieved (path jar                            4).                           Normal appearing colo-colonic anastomosis at 25 cm                            from the anus. Complications:            No immediate complications. Estimated blood loss:                            None. Estimated Blood Loss:     Estimated blood loss: none. Impression:               Synchronous colon cancers and multiple neoplastic                            appearing polyps. See above. Recommendation:           - Await pathology results. Milus Banister, MD 03/24/2019 2:39:05 PM This report has been signed electronically.

## 2019-03-24 NOTE — Patient Instructions (Signed)
The doctor did place a clip in your colon to dscrease the bleeding potential.  You have a card to carry in your pocket.  No MRI's until the clip passes.  We will call you with the pathology results.  YOU HAD AN ENDOSCOPIC PROCEDURE TODAY AT [facility]:   Refer to the procedure report that was given to you for any specific questions about what was found during the examination.  If the procedure report does not answer your questions, please call your gastroenterologist to clarify.  If you requested that your care partner not be given the details of your procedure findings, then the procedure report has been included in a sealed envelope for you to review at your convenience later.  YOU SHOULD EXPECT: Some feelings of bloating in the abdomen. Passage of more gas than usual.  Walking can help get rid of the air that was put into your GI tract during the procedure and reduce the bloating. If you had a lower endoscopy (such as a colonoscopy or flexible sigmoidoscopy) you may notice spotting of blood in your stool or on the toilet paper. If you underwent a bowel prep for your procedure, you may not have a normal bowel movement for a few days.  Please Note:  You might notice some irritation and congestion in your nose or some drainage.  This is from the oxygen used during your procedure.  There is no need for concern and it should clear up in a day or so.  SYMPTOMS TO REPORT IMMEDIATELY:   Following lower endoscopy (colonoscopy or flexible sigmoidoscopy):  Excessive amounts of blood in the stool  Significant tenderness or worsening of abdominal pains  Swelling of the abdomen that is new, acute  Fever of 100F or higher  For urgent or emergent issues, a gastroenterologist can be reached at any hour by calling [phone].   DIET:  We do recommend a small meal at first, but then you may proceed to your regular diet.  Drink plenty of fluids but you should avoid alcoholic beverages for  24 hours.  ACTIVITY:  You should plan to take it easy for the rest of today and you should NOT DRIVE or use heavy machinery until tomorrow (because of the sedation medicines used during the test).    FOLLOW UP: Our staff will call the number listed on your records 48-72 hours following your procedure to check on you and address any questions or concerns that you may have regarding the information given to you following your procedure. If we do not reach you, we will leave a message.  We will attempt to reach you two times.  During this call, we will ask if you have developed any symptoms of COVID 19. If you develop any symptoms (ie: fever, flu-like symptoms, shortness of breath, cough etc.) before then, please call [phone].  If you test positive for Covid 19 in the 2 weeks post procedure, please call and report this information to [place].    If any biopsies were taken you will be contacted by phone or by letter within the next 1-3 weeks.  Please call us at [phone] if you have not heard about the biopsies in 3 weeks.    SIGNATURES/CONFIDENTIALITY: You and/or your care partner have signed paperwork which will be entered into your electronic medical record.  These signatures attest to the fact that that the information above on your After Visit Summary has been reviewed and is understood.  Full responsibility  of the confidentiality of this discharge information lies with you and/or your care-partner.

## 2019-03-25 ENCOUNTER — Inpatient Hospital Stay (HOSPITAL_BASED_OUTPATIENT_CLINIC_OR_DEPARTMENT_OTHER): Payer: Self-pay | Admitting: Nurse Practitioner

## 2019-03-25 ENCOUNTER — Inpatient Hospital Stay: Payer: Self-pay | Attending: Oncology

## 2019-03-25 ENCOUNTER — Telehealth: Payer: Self-pay | Admitting: Gastroenterology

## 2019-03-25 ENCOUNTER — Other Ambulatory Visit: Payer: Self-pay

## 2019-03-25 ENCOUNTER — Encounter: Payer: Self-pay | Admitting: Nurse Practitioner

## 2019-03-25 VITALS — BP 134/55 | HR 85 | Temp 98.3°F | Resp 18 | Ht 68.0 in | Wt 164.2 lb

## 2019-03-25 DIAGNOSIS — D509 Iron deficiency anemia, unspecified: Secondary | ICD-10-CM | POA: Insufficient documentation

## 2019-03-25 DIAGNOSIS — C187 Malignant neoplasm of sigmoid colon: Secondary | ICD-10-CM

## 2019-03-25 DIAGNOSIS — C189 Malignant neoplasm of colon, unspecified: Secondary | ICD-10-CM

## 2019-03-25 DIAGNOSIS — D649 Anemia, unspecified: Secondary | ICD-10-CM

## 2019-03-25 DIAGNOSIS — Z800 Family history of malignant neoplasm of digestive organs: Secondary | ICD-10-CM | POA: Insufficient documentation

## 2019-03-25 LAB — CBC WITH DIFFERENTIAL (CANCER CENTER ONLY)
Abs Immature Granulocytes: 0.04 10*3/uL (ref 0.00–0.07)
Basophils Absolute: 0 10*3/uL (ref 0.0–0.1)
Basophils Relative: 1 %
Eosinophils Absolute: 0.2 10*3/uL (ref 0.0–0.5)
Eosinophils Relative: 2 %
HCT: 34.5 % — ABNORMAL LOW (ref 39.0–52.0)
Hemoglobin: 10.1 g/dL — ABNORMAL LOW (ref 13.0–17.0)
Immature Granulocytes: 1 %
Lymphocytes Relative: 24 %
Lymphs Abs: 1.9 10*3/uL (ref 0.7–4.0)
MCH: 26.2 pg (ref 26.0–34.0)
MCHC: 29.3 g/dL — ABNORMAL LOW (ref 30.0–36.0)
MCV: 89.6 fL (ref 80.0–100.0)
Monocytes Absolute: 1 10*3/uL (ref 0.1–1.0)
Monocytes Relative: 12 %
Neutro Abs: 4.9 10*3/uL (ref 1.7–7.7)
Neutrophils Relative %: 60 %
Platelet Count: 227 10*3/uL (ref 150–400)
RBC: 3.85 MIL/uL — ABNORMAL LOW (ref 4.22–5.81)
RDW: 19.5 % — ABNORMAL HIGH (ref 11.5–15.5)
WBC Count: 8.1 10*3/uL (ref 4.0–10.5)
nRBC: 0 % (ref 0.0–0.2)

## 2019-03-25 LAB — CEA (IN HOUSE-CHCC): CEA (CHCC-In House): 15.99 ng/mL — ABNORMAL HIGH (ref 0.00–5.00)

## 2019-03-25 NOTE — Telephone Encounter (Signed)
I am not sure where he came up with that, I never mentioned [facility].  I believe he has already seen Dr. Nadeen Landau at [facility] surgery.  If he has not seen Dr. Dema Severin already that is who he needs to see at [facility] surgery, Dr. Julieanne Manson has already communicated with him.

## 2019-03-25 NOTE — Telephone Encounter (Signed)
Pt states that he called [facility] to schedule appt with colorectal surgeon per Dr. Ardis Hughs' recommendations but they did not have referral.

## 2019-03-25 NOTE — Progress Notes (Signed)
  [facility] OFFICE PROGRESS NOTE   Diagnosis: Colon cancer, iron deficiency anemia  INTERVAL HISTORY:   [name] returns as scheduled.  He is accompanied by his son who serves as the interpreter.  He feels well.  No bleeding.  Bowels are moving.  No nausea or vomiting.  No abdominal pain.  Objective:  Vital signs in last 24 hours:  Blood pressure (!) 134/55, pulse 85, temperature 98.3 F (36.8 C), temperature source Temporal, resp. rate 18, height '5\' 8"'$  (1.727 m), weight 164 lb 3.2 oz (74.5 kg), SpO2 100 %.    GI: Abdomen is soft and nontender.  No hepatomegaly.  Healed surgical incisions. Vascular: No leg edema.    Lab Results:  Lab Results  Component Value Date   WBC 8.1 03/25/2019   HGB 10.1 (L) 03/25/2019   HCT 34.5 (L) 03/25/2019   MCV 89.6 03/25/2019   PLT 227 03/25/2019   NEUTROABS 4.9 03/25/2019    Imaging:  No results found.  Medications: I have reviewed the patient's current medications.  Assessment/Plan: 1. Sigmoid colon cancer, T2N0, stage I, status post a left hemicolectomy on 12/06/2018 ? Tumor invasive superficial portion of the muscle ("internal third "), no tumor perforation, no vascular invasion or perineural invasion, negative resection margins, 0/4 lymph nodes ? Elevated CEA 02/10/2019 ? CTs 03/05/2019, compared to outside CTs from 11/22/2018-worsened wall thickening in the cecum stable apple core lesion in the mid transverse colon, no evidence of recurrent tumor at the distal colonic anastomosis, no evidence of metastatic disease ? Colonoscopy 03/24/2019 20-2 malignant appearing masses noted in the colon, 1 filled the cecum measuring approximately 4 cm across and friable.  The other malignant appearing mass was circumferential creating a stricture with a 1.2 cm lumen located in the transverse segment approximately 4 to 5 cm in length.  There were 12-18 neoplastic appearing polyps in between the 2 obvious malignant masses ranging in  size from 3 mm to 2 cm.  5 polyps distal to the transverse colon mass.  2 sigmoid colon polyps. 2. Microcytic anemia secondary to #1.  On oral iron.  Improved.  3.   Family history of pancreas and colon cancer, negative genetic testing per the [employer] panel, ATM variant of unknown significance   Disposition: [name] appears stable.  He underwent a colonoscopy yesterday with 2 malignant appearing masses noted in the colon and multiple neoplastic appearing polyps.  He reports seeing Dr. Dema Severin and being referred to [facility] for surgery.  We will follow-up with Dr. Dema Severin regarding the surgery plan.  We scheduled a return visit here in approximately 4 weeks, adjust accordingly pending surgery.  Patient seen with Dr. Benay Spice.  15 minutes were spent face-to-face at today's visit with the majority of that time involved in counseling/coordination of care.    Ned Card ANP/GNP-BC   03/25/2019  2:53 PM  This was a shared visit with Ned Card. [name] has been diagnosed with synchronous colon cancers.  Dr. Dema Severin recommends surgery.  I will confirm the plan for a referral to [facility].  We will see [name] after surgery to discuss adjuvant treatment options.   Julieanne Manson, MD

## 2019-03-25 NOTE — Telephone Encounter (Signed)
The pt has been advised.

## 2019-03-25 NOTE — Telephone Encounter (Signed)
Dr Ardis Hughs I do not see a referral request for [facility].  Did you want a referral?

## 2019-03-26 ENCOUNTER — Telehealth: Payer: Self-pay | Admitting: Gastroenterology

## 2019-03-26 ENCOUNTER — Telehealth: Payer: Self-pay

## 2019-03-26 ENCOUNTER — Telehealth: Payer: Self-pay | Admitting: *Deleted

## 2019-03-26 NOTE — Telephone Encounter (Signed)
LVM

## 2019-03-26 NOTE — Telephone Encounter (Signed)
  Follow up Call-  Call back number 03/24/2019  Post procedure Call Back phone  # [phone]  Permission to leave phone message Yes     Patient questions:  Do you have a fever, pain , or abdominal swelling? No. Pain Score  0 *  Have you tolerated food without any problems? Yes.    Have you been able to return to your normal activities? Yes.    Do you have any questions about your discharge instructions: Diet   No. Medications  No. Follow up visit  No.  Do you have questions or concerns about your Care? No.  Actions: * If pain score is 4 or above: 1. No action needed, pain <4.Have you developed a fever since your procedure? no  2.   Have you had an respiratory symptoms (SOB or cough) since your procedure? no  3.   Have you tested positive for COVID 19 since your procedure no  4.   Have you had any family members/close contacts diagnosed with the COVID 19 since your procedure?  no   If yes to any of these questions please route to Joylene John, RN and Alphonsa Gin, Therapist, sports.

## 2019-03-26 NOTE — Telephone Encounter (Signed)
The pt son returned call and Dr Gearldine Shown office will follow up regarding [facility] referral the pt son states that [facility] made that referral

## 2019-03-27 ENCOUNTER — Telehealth: Payer: Self-pay | Admitting: *Deleted

## 2019-03-27 NOTE — Telephone Encounter (Signed)
Son, Jacqulyn Bath called to discuss the referral to [facility] and if Dr. Benay Spice and Dr. Dema Severin felt dad needed to go there. Informed him that Dr. Benay Spice and Dr. Dema Severin both agree his father needs the surgery and Dr. Dema Severin can do it in [place]. Thought he wanted a second opinion and if [facility] would be able to perform surgery differently and then decide where to have the surgery. Son states that they want to have the surgery in [place] and cancel the referral to [facility]. Dr. Benay Spice notified.

## 2019-03-31 ENCOUNTER — Encounter: Payer: Self-pay | Admitting: Oncology

## 2019-03-31 ENCOUNTER — Telehealth: Payer: Self-pay | Admitting: *Deleted

## 2019-03-31 NOTE — Telephone Encounter (Signed)
Telephone call to patient's son to advise surgery would be late January. He should pursue [facility] appts for a quicker appt. He states he will call right now and send a message with the appt date and time once it is scheduled.

## 2019-04-01 ENCOUNTER — Encounter: Payer: Self-pay | Admitting: *Deleted

## 2019-04-07 ENCOUNTER — Ambulatory Visit: Payer: Self-pay | Admitting: Surgery

## 2019-04-07 NOTE — H&P (Signed)
CC: Referred by Dr. Benay Spice for masses in cecum and transverse colon  HPI: [name] is a very pleasant otherwise healthy 78yoM whom recently relocated from [place]. He is now living with his son. He reports having had hematochezia and underwent evaluation and was found to have a cancer in his sigmoid colon for which she underwent a segmental colectomy of 12/06/2018. He recovered well from this. This was an open procedure. He subsequently relocated and states to live with his son and recover here. 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 No polyps. The tumor measured 4 cm. It invaded into the "superficial portion of the muscularis." There is no tumor perforation, perineural invasion, nor LVI. Margins were negative. There only for lymph nodes noted in the specimen. He had CT scans dating back 11/22/2018 that showed an asymmetric area 6 cm in size in the sigmoid colon. No liver lesions. Right inguinal hernia. He was seen by Dr. Toney Rakes in [place], a medical oncologist. He was recommended to have adjuvant capecitabine dish only having had 4 lymph nodes in the specimen. He however came and saw Dr. Benay Spice with medical oncology in [place] after arriving and states. He was evaluated for possible adjuvant therapy and underwent routine lab work which demonstrated an elevated CEA. He therefore underwent CT scans of his chest/7/pelvis. He was found to have masses in both the transverse colon and cecum. These on imaging certainly appear highly concerning for colon cancers. There is no evidence of metastatic disease in the chest/abdomen/pelvis. Our radiologists were able to review his imaging from [place] and at that time appears to have had masses in both the transverse and cecum. The masses are still present now. His CEA is elevated at 11.4. He reports he is otherwise healthy and takes no medications. His albumin is 3.6. He denies any  fevers/chills/nausea/vomiting. He has formed stool daily and denies any significant bleeding or melena. That said, he has had persistent iron deficiency anemia. He has a good functional status and is able walk up 2 flights of stairs or any difficulty. He denies any issues with incontinence to gas, liquid, or solid stool.  He underwent comprehenisve genetics evaluation and his entire genetics panel returned negative for any detectable heritable mutations.  Colonoscopy is currently pending with Dr. Ardis Hughs   PMH: Colon cancer (currently uncontrolled); iron deficiency anemia (partially controlled with PO iron + vit C)  PSH: Exploratory laparotomy with segmental colectomy and sigmoid colon Open appendectomy  FHx: Denies FHx of malignancy  Social: Denies use of drugs - former smoker but quit many years ago; social EtOH use. He is a former Civil engineer, contracting in [place]  ROS: A comprehensive 10 system review of systems was completed with the patient and pertinent findings as noted above.  The patient is a 78 year old male.   Past Surgical History Andreas Blower, [place]; 03/11/2019 9:18 AM) Appendectomy  Colon Removal - Partial   Diagnostic Studies History Andreas Blower, CMA; 03/11/2019 9:18 AM) Colonoscopy  within last year  Allergies Andreas Blower, [place]; 03/11/2019 9:19 AM) No Known Allergies [03/11/2019]:  Medication History (Armen Ferguson, CMA; 03/11/2019 9:19 AM) Vitamin C (1000MG  Tablet, Oral) Active. Iron (325 (65 Fe)MG Tablet, Oral) Active. Medications Reconciled  Social History Andreas Blower, CMA; 03/11/2019 9:18 AM) Alcohol use  Moderate alcohol use. Caffeine use  Coffee. No drug use  Tobacco use  Former smoker.  Family History Andreas Blower, [place]; 03/11/2019 9:18 AM) Breast Cancer  Mother. Cancer  Sister. Heart Disease  Father. Malignant Neoplasm Of Pancreas  Mother.  Other Problems (Armen Ferguson, CMA; 03/11/2019 9:18 AM) Colon Cancer      Review of Systems (Armen Ferguson CMA; 03/11/2019 9:18 AM) General Not Present- Appetite Loss, Chills, Fatigue, Fever, Night Sweats, Weight Gain and Weight Loss. Skin Not Present- Change in Wart/Mole, Dryness, Hives, Jaundice, New Lesions, Non-Healing Wounds, Rash and Ulcer. HEENT Not Present- Earache, Hearing Loss, Hoarseness, Nose Bleed, Oral Ulcers, Ringing in the Ears, Seasonal Allergies, Sinus Pain, Sore Throat, Visual Disturbances, Wears glasses/contact lenses and Yellow Eyes. Respiratory Not Present- Bloody sputum, Chronic Cough, Difficulty Breathing, Snoring and Wheezing. Breast Not Present- Breast Mass, Breast Pain, Nipple Discharge and Skin Changes. Cardiovascular Not Present- Chest Pain, Difficulty Breathing Lying Down, Leg Cramps, Palpitations, Rapid Heart Rate, Shortness of Breath and Swelling of Extremities. Gastrointestinal Not Present- Abdominal Pain, Bloating, Bloody Stool, Change in Bowel Habits, Chronic diarrhea, Constipation, Difficulty Swallowing, Excessive gas, Gets full quickly at meals, Hemorrhoids, Indigestion, Nausea, Rectal Pain and Vomiting. Male Genitourinary Not Present- Blood in Urine, Change in Urinary Stream, Frequency, Impotence, Nocturia, Painful Urination, Urgency and Urine Leakage. Musculoskeletal Present- Joint Pain. Not Present- Back Pain, Joint Stiffness, Muscle Pain, Muscle Weakness and Swelling of Extremities. Neurological Not Present- Decreased Memory, Fainting, Headaches, Numbness, Seizures, Tingling, Tremor, Trouble walking and Weakness. Psychiatric Not Present- Anxiety, Bipolar, Change in Sleep Pattern, Depression, Fearful and Frequent crying. Endocrine Not Present- Cold Intolerance, Excessive Hunger, Hair Changes, Heat Intolerance, Hot flashes and New Diabetes. Hematology Not Present- Blood Thinners, Easy Bruising, Excessive bleeding, Gland problems, HIV and Persistent Infections.  Vitals (Armen Ferguson CMA; 03/11/2019 9:19 AM) 03/11/2019 9:18  AM Weight: 161.38 lb Height: 69in Body Surface Area: 1.89 m Body Mass Index: 23.83 kg/m  Temp.: 97.76F  Pulse: 85 (Regular)  P.OX: 98% (Room air) BP: 150/78 (Sitting, Left Arm, Standard)       Physical Exam [place] Gave M. Vannary Greening MD; 03/11/2019 10:00 AM) The physical exam findings are as follows: Note:Constitutional: No acute distress; conversant; no deformities; wearing medical mask Eyes: Moist conjunctiva; no lid lag; anicteric sclerae; pupils equal and round Neck: Trachea midline; no palpable thyromegaly Lungs: Normal respiratory effort; no tactile fremitus CV: rrr; no palpable thrill; no pitting edema GI: Abdomen soft, nontender, nondistended; no palpable hepatosplenomegaly. Midline scar well healed - slightly erythematous as expected only 3 months out from surgery MSK: Normal gait; no clubbing/cyanosis Psychiatric: Appropriate affect; alert and oriented 3 Lymphatic: No palpable cervical or axillary lymphadenopathy    Assessment & Plan [place] Gave M. Laysa Kimmey MD; 03/11/2019 10:08 AM) COLON CANCER (C18.9) Story: [name] i sa very pleasant 60yoM with hx of sigmoid colon cancer - s/p segmental colectomy [place] 12/06/2018 - now likely metachronous cancers in both his mid transverse colon as well as cecum. His colonoscopy is currently pending but imaging certainly concerning for these being cancers. His CEA is also elevated at 11.  -I had a long discussion with the patient and his son who was serving as his Optometrist. We discussed considerations with regards to his current anatomy and blood supply to his left colon. We reviewed the anatomy, physiology, pathophysiology with the use of illustrations. We discussed this being based on how strong the collateral blood flow is to remaining left colon. -We discussed laparoscopic possible open right hemicolectomy, possible subtotal colectomy based on intraoperative findings and perfusion. We discussed possible ileo-distal  sigmoid anastomosis (or ileorectal). We also discussed possible flexible sigmoidoscopy if subtotal colectomy necessary to assess anastomosis. His current staple line appears to be in his  mid-sigmoid colon on his CT scan. We discussed scenarios where people may end up requiring an ostomy as well. -I called and spoke with them following completion of workup including colonoscopy and re-reviewed everything -We had a long discussion about postoperative quality of life related issues particularly in the setting of an ileorectal or ileosigmoid anastomosis. We discussed 3-5 bowel movements per day being the norm. We discussed frequency and urinary urgency with this configuration. Discussed technical concerns with surgery including anastomotic leak and possibilities for stomas around the time of surgery. -They had sought 2nd opinion at [facility] but were declined for evaluation and pt stated insurance/citizenship status as reasons  -The planned procedure, material risks (including, but not limited to, pain, bleeding, infection, scarring, need for blood transfusion, damage to surrounding structures- blood vessels/nerves/viscus/organs, damage to ureter, urine leak, leak from anastomosis, need for additional procedures, need for stoma which may be permanent, worsening of pre-existing medical conditions, hernia, recurrence, pneumonia, heart attack, stroke, death) benefits and alternatives to surgery were discussed at length. I noted a good probability that the procedure would help improve their symptoms. The patient's questions were answered to their satisfaction, they voiced understanding and elected to proceed with surgery. Additionally, we discussed typical postoperative expectations and the recovery process.  Nadeen Landau, M.D. [facility]

## 2019-04-29 ENCOUNTER — Inpatient Hospital Stay: Payer: Self-pay | Admitting: Oncology

## 2019-04-30 NOTE — Patient Instructions (Addendum)
DUE TO COVID-19 ONLY ONE VISITOR IS ALLOWED TO COME WITH YOU AND STAY IN THE WAITING ROOM ONLY DURING PRE OP AND PROCEDURE DAY OF SURGERY. THE 1 VISITOR MAY VISIT WITH YOU AFTER SURGERY IN YOUR PRIVATE ROOM DURING VISITING HOURS ONLY!  YOU NEED TO HAVE A COVID 19 TEST ON: 05/03/19  @     11:45 am     , THIS TEST MUST BE DONE BEFORE SURGERY, COME  [address], [place] [place] , [address].  ([place]) ONCE YOUR COVID TEST IS COMPLETED, PLEASE BEGIN THE QUARANTINE INSTRUCTIONS AS OUTLINED IN YOUR HANDOUT.                [place]      Your procedure is scheduled on: 05/07/19    Report to [facility] Main  Entrance   Report to admitting at: 11:15 AM     Call this number if you have problems the morning of surgery [phone]    Remember:  [place], NO CHEWING GUM [place].     Take these medicines the morning of surgery with A SIP OF WATER:                 You may not have any metal on your body including hair pins and              piercings  Do not wear jewelry,lotions, powders or perfumes, deodorant            Men may shave face and neck.    Do not bring valuables to the hospital. [place].  Contacts, dentures or bridgework may not be worn into surgery.  Leave suitcase in the car. After surgery it may be brought to your room.     _____________________________________________________________________   PLEASE DRINK PLENTY CLEAR LIQUIDS THE DAY BEFORE YOUR SURGERY.          DRINK 2 PRESURGERY ENSURE DRINKS THE NIGHT BEFORE SURGERY AT  1000 PM AND 1 PRESURGERY DRINK THE DAY OF THE PROCEDURE 3 HOURS PRIOR TO SCHEDULED SURGERY. NO SOLIDS AFTER MIDNIGHT THE DAY PRIOR TO THE SURGERY. NOTHING BY MOUTH EXCEPT CLEAR LIQUIDS UNTIL THREE HOURS PRIOR TO SCHEDULED SURGERY:10:15 am PLEASE FINISH PRESURGERY ENSURE DRINK PER SURGEON ORDER 3  HOURS PRIOR TO SCHEDULED SURGERY TIME WHICH NEEDS TO BE COMPLETED AT: 10:15 am.   CLEAR LIQUID DIET   Foods Allowed                                                                     Foods Excluded  Coffee and tea, regular and decaf                             liquids that you cannot  Plain Jell-O any favor except red or purple  see through such as: Fruit ices (not with fruit pulp)                                     milk, soups, orange juice  Iced Popsicles                                    All solid food Carbonated beverages, regular and diet                                    Cranberry, grape and apple juices Sports drinks like Gatorade Lightly seasoned clear broth or consume(fat free) Sugar, honey syrup  Sample Menu Breakfast                                Lunch                                     Supper Cranberry juice                    Beef broth                            Chicken broth Jell-O                                     Grape juice                           Apple juice Coffee or tea                        Jell-O                                      Popsicle                                                Coffee or tea                        Coffee or tea  _____________________________________________________________________   [facility] - Preparing for Surgery Before surgery, you can play an important role.  Because skin is not sterile, your skin needs to be as free of germs as possible.  You can reduce the number of germs on your skin by washing with CHG (chlorahexidine gluconate) soap before surgery.  CHG is an antiseptic cleaner which kills germs and bonds with the skin to continue killing germs even after washing. Please DO NOT use if you have an allergy to CHG or antibacterial soaps.  If your skin becomes reddened/irritated stop using the CHG and inform your nurse when you arrive at Short Stay. Do not shave (including legs  and  underarms) for at least 48 hours prior to the first CHG shower.  You may shave your face/neck. Please follow these instructions carefully:  1.  Shower with CHG Soap the night before surgery and the  morning of Surgery.  2.  If you choose to wash your hair, wash your hair first as usual with your  normal  shampoo.  3.  After you shampoo, rinse your hair and body thoroughly to remove the  shampoo.                           4.  Use CHG as you would any other liquid soap.  You can apply chg directly  to the skin and wash                       Gently with a scrungie or clean washcloth.  5.  Apply the CHG Soap to your body ONLY FROM THE NECK DOWN.   Do not use on face/ open                           Wound or open sores. Avoid contact with eyes, ears mouth and genitals (private parts).                       Wash face,  Genitals (private parts) with your normal soap.             6.  Wash thoroughly, paying special attention to the area where your surgery  will be performed.  7.  Thoroughly rinse your body with warm water from the neck down.  8.  DO NOT shower/wash with your normal soap after using and rinsing off  the CHG Soap.                9.  Pat yourself dry with a clean towel.            10.  Wear clean pajamas.            11.  Place clean sheets on your bed the night of your first shower and do not  sleep with pets. Day of Surgery : Do not apply any lotions/deodorants the morning of surgery.  Please wear clean clothes to the hospital/surgery center.  FAILURE TO FOLLOW THESE INSTRUCTIONS MAY RESULT IN THE CANCELLATION OF YOUR SURGERY PATIENT SIGNATURE_________________________________  NURSE SIGNATURE__________________________________  ________________________________________________________________________    Adam [place]   An incentive spirometer is a tool that can help keep your lungs clear and active. This tool measures how well you are filling your lungs with each breath.  Taking long deep breaths may help reverse or decrease the chance of developing breathing (pulmonary) problems (especially infection) following:  A long period of time when you are unable to move or be active. BEFORE THE PROCEDURE   If the spirometer includes an indicator to show your best effort, your nurse or respiratory therapist will set it to a desired goal.  If possible, sit up straight or lean slightly forward. Try not to slouch.  Hold the incentive spirometer in an upright position. INSTRUCTIONS FOR USE  1. Sit on the edge of your bed if possible, or sit up as far as you can in bed or on a chair. 2. Hold the incentive spirometer in an upright position. 3. Breathe out normally. 4. Place the mouthpiece in your mouth and seal  your lips tightly around it. 5. Breathe in slowly and as deeply as possible, raising the piston or the ball toward the top of the column. 6. Hold your breath for 3-5 seconds or for as long as possible. Allow the piston or ball to fall to the bottom of the column. 7. Remove the mouthpiece from your mouth and breathe out normally. 8. Rest for a few seconds and repeat Steps 1 through 7 at least 10 times every 1-2 hours when you are awake. Take your time and take a few normal breaths between deep breaths. 9. The spirometer may include an indicator to show your best effort. Use the indicator as a goal to work toward during each repetition. 10. After each set of 10 deep breaths, practice coughing to be sure your lungs are clear. If you have an incision (the cut made at the time of surgery), support your incision when coughing by placing a pillow or rolled up towels firmly against it. Once you are able to get out of bed, walk around indoors and cough well. You may stop using the incentive spirometer when instructed by your caregiver.  RISKS AND COMPLICATIONS  Take your time so you do not get dizzy or light-headed.  If you are in pain, you may need to take or ask for pain  medication before doing incentive spirometry. It is harder to take a deep breath if you are having pain. AFTER USE  Rest and breathe slowly and easily.  It can be helpful to keep track of a log of your progress. Your caregiver can provide you with a simple table to help with this. If you are using the spirometer at home, follow these instructions: [place] IF:   You are having difficultly using the spirometer.  You have trouble using the spirometer as often as instructed.  Your pain medication is not giving enough relief while using the spirometer.  You develop fever of 100.5 F (38.1 C) or higher. SEEK IMMEDIATE MEDICAL CARE IF:   You cough up bloody sputum that had not been present before.  You develop fever of 102 F (38.9 C) or greater.  You develop worsening pain at or near the incision site. MAKE SURE YOU:   Understand these instructions.  Will watch your condition.  Will get help right away if you are not doing well or get worse. Document Released: 08/21/2006 Document Revised: 07/03/2011 Document Reviewed: 10/22/2006 [facility] Patient Information 2014 [place], [place].   ________________________________________________________________________

## 2019-05-02 ENCOUNTER — Other Ambulatory Visit: Payer: Self-pay

## 2019-05-02 ENCOUNTER — Encounter (HOSPITAL_COMMUNITY)
Admission: RE | Admit: 2019-05-02 | Discharge: 2019-05-02 | Disposition: A | Discharge status: Home / Self Care | Payer: Self-pay | Source: Ambulatory Visit | Attending: Surgery | Admitting: Surgery

## 2019-05-02 ENCOUNTER — Encounter (HOSPITAL_COMMUNITY): Payer: Self-pay

## 2019-05-02 DIAGNOSIS — Z01812 Encounter for preprocedural laboratory examination: Secondary | ICD-10-CM | POA: Insufficient documentation

## 2019-05-02 HISTORY — DX: Anemia, unspecified: D64.9

## 2019-05-02 LAB — CBC WITH DIFFERENTIAL/PLATELET
Abs Immature Granulocytes: 0.04 10*3/uL (ref 0.00–0.07)
Basophils Absolute: 0 10*3/uL (ref 0.0–0.1)
Basophils Relative: 0 %
Eosinophils Absolute: 0.2 10*3/uL (ref 0.0–0.5)
Eosinophils Relative: 3 %
HCT: 38.1 % — ABNORMAL LOW (ref 39.0–52.0)
Hemoglobin: 11.3 g/dL — ABNORMAL LOW (ref 13.0–17.0)
Immature Granulocytes: 1 %
Lymphocytes Relative: 20 %
Lymphs Abs: 1.4 10*3/uL (ref 0.7–4.0)
MCH: 27.2 pg (ref 26.0–34.0)
MCHC: 29.7 g/dL — ABNORMAL LOW (ref 30.0–36.0)
MCV: 91.6 fL (ref 80.0–100.0)
Monocytes Absolute: 0.7 10*3/uL (ref 0.1–1.0)
Monocytes Relative: 11 %
Neutro Abs: 4.4 10*3/uL (ref 1.7–7.7)
Neutrophils Relative %: 65 %
Platelets: 252 10*3/uL (ref 150–400)
RBC: 4.16 MIL/uL — ABNORMAL LOW (ref 4.22–5.81)
RDW: 17.2 % — ABNORMAL HIGH (ref 11.5–15.5)
WBC: 6.8 10*3/uL (ref 4.0–10.5)
nRBC: 0 % (ref 0.0–0.2)

## 2019-05-02 LAB — COMPREHENSIVE METABOLIC PANEL
ALT: 14 U/L (ref 0–44)
AST: 24 U/L (ref 15–41)
Albumin: 3.6 g/dL (ref 3.5–5.0)
Alkaline Phosphatase: 64 U/L (ref 38–126)
Anion gap: 8 (ref 5–15)
BUN: 25 mg/dL — ABNORMAL HIGH (ref 8–23)
CO2: 25 mmol/L (ref 22–32)
Calcium: 8.9 mg/dL (ref 8.9–10.3)
Chloride: 105 mmol/L (ref 98–111)
Creatinine, Ser: 0.9 mg/dL (ref 0.61–1.24)
GFR calc Af Amer: >60 mL/min (ref >60)
GFR calc non Af Amer: >60 mL/min (ref >60)
Glucose, Bld: 111 mg/dL — ABNORMAL HIGH (ref 70–99)
Potassium: 4.3 mmol/L (ref 3.5–5.1)
Sodium: 138 mmol/L (ref 135–145)
Total Bilirubin: 0.7 mg/dL (ref 0.3–1.2)
Total Protein: 6.6 g/dL (ref 6.5–8.1)

## 2019-05-02 LAB — PROTIME-INR
INR: 1 (ref 0.8–1.2)
Prothrombin Time: 13.2 seconds (ref 11.4–15.2)

## 2019-05-02 LAB — HEMOGLOBIN A1C
Hgb A1c MFr Bld: 4.5 % — ABNORMAL LOW (ref 4.8–5.6)
Mean Plasma Glucose: 82.45 mg/dL

## 2019-05-02 LAB — APTT: aPTT: 30 seconds (ref 24–36)

## 2019-05-02 NOTE — Progress Notes (Signed)
PCP -  Cardiologist -   Chest x-ray -  EKG -  Stress Test -  ECHO -  Cardiac Cath -   Sleep Study -  CPAP -   Fasting Blood Sugar -  Checks Blood Sugar _____ times a day  Blood Thinner Instructions: Aspirin Instructions: Last Dose:  Anesthesia review:   Patient denies shortness of breath, fever, cough and chest pain at PAT appointment   Patient verbalized understanding of instructions that were given to them at the PAT appointment. Patient was also instructed that they will need to review over the PAT instructions again at home before surgery.

## 2019-05-02 NOTE — Consult Note (Addendum)
[place] Nurse requested for preoperative stoma site marking  Discussed surgical procedure and stoma creation with patient using Spanish translator in the room.  Explained role of the [place] nurse team.  Provided the patient with educational booklet and provided samples of pouching options.  Answered patient's and son's questions.   Examined patient sitting, and standing in order to place the marking in the patient's visual field, away from any creases or abdominal contour issues and within the rectus muscle.  Attempted to mark below the patient's belt line. There are significant creases which occur above and below these sites which occur when the patient leans forward and should be avoided if possible.   Marked for colostomy in the LLQ  __6__ cm to the left of the umbilicus and [id number] below the umbilicus.  Marked for ileostomy in the RLQ  _6___cm to the right of the umbilicus and  [id number] cm below the umbilicus.  Patient's abdomen cleansed with CHG wipes at site markings, allowed to air dry prior to marking.Covered mark with thin film transparent dressing to preserve mark until date of surgery and gave him the pen to re-color in the marks if they fade before surgery.    [place] Nurse team will follow up with patient after surgery for continued ostomy care and teaching if patient receives an ostomy.  Julien Girt MSN, RN, [place], [place], [place]

## 2019-05-03 ENCOUNTER — Other Ambulatory Visit (HOSPITAL_COMMUNITY)
Admission: RE | Admit: 2019-05-03 | Discharge: 2019-05-03 | Disposition: A | Discharge status: Home / Self Care | Payer: HRSA Program | Source: Ambulatory Visit | Attending: Surgery | Admitting: Surgery

## 2019-05-03 DIAGNOSIS — Z01812 Encounter for preprocedural laboratory examination: Secondary | ICD-10-CM | POA: Insufficient documentation

## 2019-05-03 DIAGNOSIS — Z20822 Contact with and (suspected) exposure to covid-19: Secondary | ICD-10-CM | POA: Diagnosis not present

## 2019-05-04 LAB — NOVEL CORONAVIRUS, NAA (HOSP ORDER, SEND-OUT TO REF LAB; TAT 18-24 HRS): SARS-CoV-2, NAA: NOT DETECTED

## 2019-05-06 MED ORDER — BUPIVACAINE LIPOSOME 1.3 % IJ SUSP
20.0000 mL | INTRAMUSCULAR | Status: DC
  Filled 2019-05-06: qty 20

## 2019-05-07 ENCOUNTER — Encounter (HOSPITAL_COMMUNITY)
Admission: RE | Disposition: A | Discharge status: Home Health | Payer: Self-pay | Source: Home / Self Care | Attending: Surgery

## 2019-05-07 ENCOUNTER — Encounter (HOSPITAL_COMMUNITY): Payer: Self-pay | Admitting: Surgery

## 2019-05-07 ENCOUNTER — Inpatient Hospital Stay (HOSPITAL_COMMUNITY)
Admission: RE | Admit: 2019-05-07 | Discharge: 2019-05-09 | DRG: 330 | Disposition: A | Discharge status: Home Health | Payer: Self-pay | Attending: Surgery | Admitting: Surgery

## 2019-05-07 ENCOUNTER — Inpatient Hospital Stay (HOSPITAL_COMMUNITY): Payer: Self-pay | Admitting: Physician Assistant

## 2019-05-07 ENCOUNTER — Other Ambulatory Visit: Payer: Self-pay

## 2019-05-07 DIAGNOSIS — D509 Iron deficiency anemia, unspecified: Secondary | ICD-10-CM | POA: Diagnosis present

## 2019-05-07 DIAGNOSIS — K660 Peritoneal adhesions (postprocedural) (postinfection): Secondary | ICD-10-CM | POA: Diagnosis present

## 2019-05-07 DIAGNOSIS — Z87891 Personal history of nicotine dependence: Secondary | ICD-10-CM

## 2019-05-07 DIAGNOSIS — C180 Malignant neoplasm of cecum: Secondary | ICD-10-CM | POA: Diagnosis present

## 2019-05-07 DIAGNOSIS — C184 Malignant neoplasm of transverse colon: Principal | ICD-10-CM | POA: Diagnosis present

## 2019-05-07 DIAGNOSIS — Z806 Family history of leukemia: Secondary | ICD-10-CM

## 2019-05-07 DIAGNOSIS — Z800 Family history of malignant neoplasm of digestive organs: Secondary | ICD-10-CM

## 2019-05-07 DIAGNOSIS — Z23 Encounter for immunization: Secondary | ICD-10-CM

## 2019-05-07 DIAGNOSIS — Z85038 Personal history of other malignant neoplasm of large intestine: Secondary | ICD-10-CM

## 2019-05-07 DIAGNOSIS — C189 Malignant neoplasm of colon, unspecified: Secondary | ICD-10-CM | POA: Diagnosis present

## 2019-05-07 HISTORY — PX: LAPAROSCOPIC RIGHT HEMI COLECTOMY: SHX5926

## 2019-05-07 LAB — TYPE AND SCREEN
ABO/RH(D): O POS
Antibody Screen: NEGATIVE

## 2019-05-07 LAB — ABO/RH: ABO/RH(D): O POS

## 2019-05-07 SURGERY — LAPAROSCOPIC RIGHT HEMI COLECTOMY
Anesthesia: General | Laterality: Right

## 2019-05-07 MED ORDER — FENTANYL CITRATE (PF) 100 MCG/2ML IJ SOLN
INTRAMUSCULAR | Status: AC
  Filled 2019-05-07: qty 4

## 2019-05-07 MED ORDER — PROPOFOL 10 MG/ML IV BOLUS
INTRAVENOUS | Status: DC | PRN
  Administered 2019-05-07: 50 mg via INTRAVENOUS
  Administered 2019-05-07: 150 mg via INTRAVENOUS

## 2019-05-07 MED ORDER — FENTANYL CITRATE (PF) 250 MCG/5ML IJ SOLN
INTRAMUSCULAR | Status: AC
  Filled 2019-05-07: qty 5

## 2019-05-07 MED ORDER — ONDANSETRON HCL 4 MG PO TABS: 4.0000 mg | ORAL_TABLET | Freq: Four times a day (QID) | ORAL | Status: DC | PRN

## 2019-05-07 MED ORDER — LACTATED RINGERS IV SOLN: INTRAVENOUS | Status: DC

## 2019-05-07 MED ORDER — LABETALOL HCL 5 MG/ML IV SOLN
INTRAVENOUS | Status: DC | PRN
  Administered 2019-05-07: 5 mg via INTRAVENOUS

## 2019-05-07 MED ORDER — EPHEDRINE SULFATE-NACL 50-0.9 MG/10ML-% IV SOSY
PREFILLED_SYRINGE | INTRAVENOUS | Status: DC | PRN
  Administered 2019-05-07: 10 mg via INTRAVENOUS

## 2019-05-07 MED ORDER — CHLORHEXIDINE GLUCONATE CLOTH 2 % EX PADS: 6.0000 | MEDICATED_PAD | Freq: Once | CUTANEOUS | Status: DC

## 2019-05-07 MED ORDER — HYDROMORPHONE HCL 1 MG/ML IJ SOLN: 0.5000 mg | INTRAMUSCULAR | Status: DC | PRN

## 2019-05-07 MED ORDER — PNEUMOCOCCAL VAC POLYVALENT 25 MCG/0.5ML IJ INJ
0.5000 mL | INJECTION | INTRAMUSCULAR | Status: DC
  Filled 2019-05-07: qty 0.5

## 2019-05-07 MED ORDER — HEPARIN SODIUM (PORCINE) 5000 UNIT/ML IJ SOLN
5000.0000 [IU] | Freq: Once | INTRAMUSCULAR | Status: AC
  Administered 2019-05-07: 10:00:00 5000 [IU] via SUBCUTANEOUS
  Filled 2019-05-07: qty 1

## 2019-05-07 MED ORDER — METRONIDAZOLE 500 MG PO TABS: 1000.0000 mg | ORAL_TABLET | ORAL | Status: DC

## 2019-05-07 MED ORDER — SODIUM CHLORIDE 0.9 % IV SOLN
2.0000 g | INTRAVENOUS | Status: AC
  Administered 2019-05-07: 2 g via INTRAVENOUS
  Filled 2019-05-07: qty 2

## 2019-05-07 MED ORDER — ALUM & MAG HYDROXIDE-SIMETH 200-200-20 MG/5ML PO SUSP: 30.0000 mL | Freq: Four times a day (QID) | ORAL | Status: DC | PRN

## 2019-05-07 MED ORDER — DIPHENHYDRAMINE HCL 50 MG/ML IJ SOLN: 12.5000 mg | Freq: Four times a day (QID) | INTRAMUSCULAR | Status: DC | PRN

## 2019-05-07 MED ORDER — ROCURONIUM BROMIDE 10 MG/ML (PF) SYRINGE
PREFILLED_SYRINGE | INTRAVENOUS | Status: DC | PRN
  Administered 2019-05-07: 40 mg via INTRAVENOUS
  Administered 2019-05-07: 10 mg via INTRAVENOUS
  Administered 2019-05-07: 20 mg via INTRAVENOUS
  Administered 2019-05-07: 10 mg via INTRAVENOUS
  Administered 2019-05-07: 20 mg via INTRAVENOUS

## 2019-05-07 MED ORDER — ONDANSETRON HCL 4 MG/2ML IJ SOLN
INTRAMUSCULAR | Status: AC
  Filled 2019-05-07: qty 2

## 2019-05-07 MED ORDER — LIDOCAINE 2% (20 MG/ML) 5 ML SYRINGE
INTRAMUSCULAR | Status: DC | PRN
  Administered 2019-05-07: 60 mg via INTRAVENOUS

## 2019-05-07 MED ORDER — SUGAMMADEX SODIUM 200 MG/2ML IV SOLN
INTRAVENOUS | Status: DC | PRN
  Administered 2019-05-07: 200 mg via INTRAVENOUS

## 2019-05-07 MED ORDER — ENSURE SURGERY PO LIQD
237.0000 mL | Freq: Two times a day (BID) | ORAL | Status: DC
  Administered 2019-05-07 – 2019-05-09 (×4): 237 mL via ORAL
  Filled 2019-05-07 (×5): qty 237

## 2019-05-07 MED ORDER — POLYETHYLENE GLYCOL 3350 17 GM/SCOOP PO POWD: 1.0000 | Freq: Once | ORAL | Status: DC

## 2019-05-07 MED ORDER — DEXAMETHASONE SODIUM PHOSPHATE 10 MG/ML IJ SOLN
INTRAMUSCULAR | Status: DC | PRN
  Administered 2019-05-07: 10 mg via INTRAVENOUS

## 2019-05-07 MED ORDER — BUPIVACAINE HCL (PF) 0.25 % IJ SOLN
INTRAMUSCULAR | Status: AC
  Filled 2019-05-07: qty 30

## 2019-05-07 MED ORDER — DIPHENHYDRAMINE HCL 12.5 MG/5ML PO ELIX: 12.5000 mg | ORAL_SOLUTION | Freq: Four times a day (QID) | ORAL | Status: DC | PRN

## 2019-05-07 MED ORDER — FENTANYL CITRATE (PF) 100 MCG/2ML IJ SOLN
25.0000 ug | INTRAMUSCULAR | Status: DC | PRN
  Administered 2019-05-07: 15:00:00 50 ug via INTRAVENOUS
  Administered 2019-05-07 (×2): 25 ug via INTRAVENOUS

## 2019-05-07 MED ORDER — KETAMINE HCL 10 MG/ML IJ SOLN
INTRAMUSCULAR | Status: AC
  Filled 2019-05-07: qty 1

## 2019-05-07 MED ORDER — ALBUMIN HUMAN 5 % IV SOLN: INTRAVENOUS | Status: DC | PRN

## 2019-05-07 MED ORDER — BUPIVACAINE HCL (PF) 0.25 % IJ SOLN
INTRAMUSCULAR | Status: DC | PRN
  Administered 2019-05-07: 30 mL

## 2019-05-07 MED ORDER — ALBUMIN HUMAN 5 % IV SOLN
INTRAVENOUS | Status: AC
  Filled 2019-05-07: qty 250

## 2019-05-07 MED ORDER — ONDANSETRON HCL 4 MG/2ML IJ SOLN: 4.0000 mg | Freq: Four times a day (QID) | INTRAMUSCULAR | Status: DC | PRN

## 2019-05-07 MED ORDER — HEPARIN SODIUM (PORCINE) 5000 UNIT/ML IJ SOLN
5000.0000 [IU] | Freq: Three times a day (TID) | INTRAMUSCULAR | Status: DC
  Administered 2019-05-07 – 2019-05-09 (×5): 5000 [IU] via SUBCUTANEOUS
  Filled 2019-05-07 (×5): qty 1

## 2019-05-07 MED ORDER — ASCORBIC ACID 500 MG PO TABS
1000.0000 mg | ORAL_TABLET | Freq: Every day | ORAL | Status: DC
  Administered 2019-05-08 – 2019-05-09 (×2): 1000 mg via ORAL
  Filled 2019-05-07 (×2): qty 2

## 2019-05-07 MED ORDER — TRAMADOL HCL 50 MG PO TABS
50.0000 mg | ORAL_TABLET | Freq: Four times a day (QID) | ORAL | Status: DC | PRN
  Administered 2019-05-07 – 2019-05-08 (×2): 50 mg via ORAL
  Filled 2019-05-07 (×2): qty 1

## 2019-05-07 MED ORDER — BISACODYL 5 MG PO TBEC
20.0000 mg | DELAYED_RELEASE_TABLET | Freq: Once | ORAL | Status: DC
  Filled 2019-05-07: qty 4

## 2019-05-07 MED ORDER — NEOMYCIN SULFATE 500 MG PO TABS: 1000.0000 mg | ORAL_TABLET | ORAL | Status: DC

## 2019-05-07 MED ORDER — LIDOCAINE 2% (20 MG/ML) 5 ML SYRINGE
INTRAMUSCULAR | Status: DC | PRN
  Administered 2019-05-07: 4.9 mL/h via INTRAVENOUS

## 2019-05-07 MED ORDER — SUCCINYLCHOLINE CHLORIDE 20 MG/ML IJ SOLN
INTRAMUSCULAR | Status: DC | PRN
  Administered 2019-05-07: 100 mg via INTRAVENOUS

## 2019-05-07 MED ORDER — PHENYLEPHRINE 40 MCG/ML (10ML) SYRINGE FOR IV PUSH (FOR BLOOD PRESSURE SUPPORT)
PREFILLED_SYRINGE | INTRAVENOUS | Status: DC | PRN
  Administered 2019-05-07 (×2): 80 ug via INTRAVENOUS

## 2019-05-07 MED ORDER — ONDANSETRON HCL 4 MG/2ML IJ SOLN: 4.0000 mg | Freq: Once | INTRAMUSCULAR | Status: DC | PRN

## 2019-05-07 MED ORDER — ACETAMINOPHEN 500 MG PO TABS
1000.0000 mg | ORAL_TABLET | Freq: Four times a day (QID) | ORAL | Status: DC
  Administered 2019-05-07 – 2019-05-09 (×6): 1000 mg via ORAL
  Filled 2019-05-07 (×6): qty 2

## 2019-05-07 MED ORDER — LACTATED RINGERS IR SOLN
Status: DC | PRN
  Administered 2019-05-07: 1

## 2019-05-07 MED ORDER — ALVIMOPAN 12 MG PO CAPS
12.0000 mg | ORAL_CAPSULE | ORAL | Status: AC
  Administered 2019-05-07: 10:00:00 12 mg via ORAL
  Filled 2019-05-07: qty 1

## 2019-05-07 MED ORDER — KETAMINE HCL 50 MG/ML IJ SOLN
INTRAMUSCULAR | Status: DC | PRN
  Administered 2019-05-07: 30 mg via INTRAMUSCULAR

## 2019-05-07 MED ORDER — MIDAZOLAM HCL 2 MG/2ML IJ SOLN
INTRAMUSCULAR | Status: AC
  Filled 2019-05-07: qty 2

## 2019-05-07 MED ORDER — ONDANSETRON HCL 4 MG/2ML IJ SOLN
INTRAMUSCULAR | Status: DC | PRN
  Administered 2019-05-07: 4 mg via INTRAVENOUS

## 2019-05-07 MED ORDER — IBUPROFEN 200 MG PO TABS: 600.0000 mg | ORAL_TABLET | Freq: Four times a day (QID) | ORAL | Status: DC | PRN

## 2019-05-07 MED ORDER — BUPIVACAINE LIPOSOME 1.3 % IJ SUSP
INTRAMUSCULAR | Status: DC | PRN
  Administered 2019-05-07: 20 mL

## 2019-05-07 MED ORDER — FERROUS SULFATE 325 (65 FE) MG PO TABS
325.0000 mg | ORAL_TABLET | Freq: Two times a day (BID) | ORAL | Status: DC
  Administered 2019-05-08 – 2019-05-09 (×3): 325 mg via ORAL
  Filled 2019-05-07 (×3): qty 1

## 2019-05-07 MED ORDER — SODIUM CHLORIDE (PF) 0.9 % IJ SOLN
INTRAMUSCULAR | Status: AC
  Filled 2019-05-07: qty 50

## 2019-05-07 MED ORDER — SODIUM CHLORIDE 0.9 % IR SOLN
Status: DC | PRN
  Administered 2019-05-07: 2000 mL

## 2019-05-07 MED ORDER — MIDAZOLAM HCL 5 MG/5ML IJ SOLN
INTRAMUSCULAR | Status: DC | PRN
  Administered 2019-05-07: 2 mg via INTRAVENOUS

## 2019-05-07 MED ORDER — ACETAMINOPHEN 500 MG PO TABS
1000.0000 mg | ORAL_TABLET | ORAL | Status: AC
  Administered 2019-05-07: 10:00:00 1000 mg via ORAL
  Filled 2019-05-07: qty 2

## 2019-05-07 MED ORDER — ALVIMOPAN 12 MG PO CAPS
12.0000 mg | ORAL_CAPSULE | Freq: Two times a day (BID) | ORAL | Status: DC
  Administered 2019-05-08: 12 mg via ORAL
  Filled 2019-05-07: qty 1

## 2019-05-07 MED ORDER — FENTANYL CITRATE (PF) 100 MCG/2ML IJ SOLN
INTRAMUSCULAR | Status: DC | PRN
  Administered 2019-05-07: 50 ug via INTRAVENOUS
  Administered 2019-05-07: 100 ug via INTRAVENOUS
  Administered 2019-05-07 (×5): 50 ug via INTRAVENOUS
  Administered 2019-05-07: 100 ug via INTRAVENOUS

## 2019-05-07 SURGICAL SUPPLY — 95 items
APPLICATOR COTTON TIP 6 STRL (MISCELLANEOUS) ×4 IMPLANT
APPLICATOR COTTON TIP 6IN STRL (MISCELLANEOUS) ×6
APPLIER CLIP ROT 10 11.4 M/L (STAPLE)
BLADE CLIPPER SURG (BLADE) IMPLANT
BLADE EXTENDED COATED 6.5IN (ELECTRODE) IMPLANT
BLADE HEX COATED 2.75 (ELECTRODE) ×3 IMPLANT
BLADE SURG SZ10 CARB STEEL (BLADE) IMPLANT
CABLE HIGH FREQUENCY MONO STRZ (ELECTRODE) ×6 IMPLANT
CELLS DAT CNTRL 66122 CELL SVR (MISCELLANEOUS) IMPLANT
CHLORAPREP W/TINT 26 (MISCELLANEOUS) ×3 IMPLANT
CLIP APPLIE ROT 10 11.4 M/L (STAPLE) IMPLANT
CLIP VESOCCLUDE LG 6/CT (CLIP) IMPLANT
COVER MAYO STAND STRL (DRAPES) ×3 IMPLANT
COVER WAND RF STERILE (DRAPES) IMPLANT
DECANTER SPIKE VIAL GLASS SM (MISCELLANEOUS) ×3 IMPLANT
DERMABOND ADVANCED (GAUZE/BANDAGES/DRESSINGS) ×1
DERMABOND ADVANCED .7 DNX12 (GAUZE/BANDAGES/DRESSINGS) ×2 IMPLANT
DISSECTOR BLUNT TIP ENDO 5MM (MISCELLANEOUS) IMPLANT
DRAIN CHANNEL 10F 3/8 F FF (DRAIN) IMPLANT
DRAPE LAPAROSCOPIC ABDOMINAL (DRAPES) ×3 IMPLANT
DRAPE SHEET LG 3/4 BI-LAMINATE (DRAPES) IMPLANT
DRAPE WARM FLUID 44X44 (DRAPES) ×3 IMPLANT
DRSG OPSITE POSTOP 4X6 (GAUZE/BANDAGES/DRESSINGS) ×3 IMPLANT
DRSG PAD ABDOMINAL 8X10 ST (GAUZE/BANDAGES/DRESSINGS) IMPLANT
ELECT REM PT RETURN 15FT ADLT (MISCELLANEOUS) ×3 IMPLANT
EVACUATOR DRAINAGE 10X20 100CC (DRAIN) IMPLANT
EVACUATOR SILICONE 100CC (DRAIN) IMPLANT
GAUZE 4X4 16PLY RFD (DISPOSABLE) IMPLANT
GAUZE SPONGE 4X4 12PLY STRL (GAUZE/BANDAGES/DRESSINGS) ×3 IMPLANT
GLOVE BIO SURGEON STRL SZ7.5 (GLOVE) ×6 IMPLANT
GLOVE BIOGEL PI IND STRL 7.0 (GLOVE) ×2 IMPLANT
GLOVE BIOGEL PI INDICATOR 7.0 (GLOVE) ×1
GLOVE ECLIPSE 8.0 STRL XLNG CF (GLOVE) ×6 IMPLANT
GLOVE INDICATOR 8.0 STRL GRN (GLOVE) ×6 IMPLANT
GOWN STRL REUS W/TWL LRG LVL3 (GOWN DISPOSABLE) ×3 IMPLANT
GOWN STRL REUS W/TWL XL LVL3 (GOWN DISPOSABLE) ×12 IMPLANT
HANDLE SUCTION POOLE (INSTRUMENTS) ×4 IMPLANT
KIT BASIN OR (CUSTOM PROCEDURE TRAY) ×3 IMPLANT
KIT TURNOVER KIT A (KITS) IMPLANT
LEGGING LITHOTOMY PAIR STRL (DRAPES) IMPLANT
LIGASURE IMPACT 36 18CM CVD LR (INSTRUMENTS) IMPLANT
NS IRRIG 1000ML POUR BTL (IV SOLUTION) ×6 IMPLANT
PACK COLON (CUSTOM PROCEDURE TRAY) ×3 IMPLANT
PACK GENERAL/GYN (CUSTOM PROCEDURE TRAY) ×3 IMPLANT
PAD POSITIONING PINK XL (MISCELLANEOUS) IMPLANT
PAD TELFA 2X3 NADH STRL (GAUZE/BANDAGES/DRESSINGS) IMPLANT
PENCIL SMOKE EVACUATOR (MISCELLANEOUS) IMPLANT
PORT LAP GEL ALEXIS MED 5-9CM (MISCELLANEOUS) ×3 IMPLANT
PROTECTOR NERVE ULNAR (MISCELLANEOUS) IMPLANT
RELOAD PROXIMATE 75MM BLUE (ENDOMECHANICALS) ×6 IMPLANT
RTRCTR WOUND ALEXIS 18CM MED (MISCELLANEOUS)
SCISSORS LAP 5X35 DISP (ENDOMECHANICALS) ×3 IMPLANT
SEALER TISSUE G2 STRG ARTC 35C (ENDOMECHANICALS) ×3 IMPLANT
SET IRRIG TUBING LAPAROSCOPIC (IRRIGATION / IRRIGATOR) IMPLANT
SET TUBE SMOKE EVAC HIGH FLOW (TUBING) ×3 IMPLANT
SHEARS HARMONIC ACE PLUS 36CM (ENDOMECHANICALS) IMPLANT
SLEEVE ADV FIXATION 5X100MM (TROCAR) ×12 IMPLANT
SLEEVE ENDOPATH XCEL 5M (ENDOMECHANICALS) ×3 IMPLANT
STAPLER GUN LINEAR PROX 60 (STAPLE) ×3 IMPLANT
STAPLER PROXIMATE 75MM BLUE (STAPLE) ×3 IMPLANT
STAPLER VISISTAT 35W (STAPLE) ×3 IMPLANT
SUCTION POOLE HANDLE (INSTRUMENTS) ×6
SUT CHROMIC 0 SH (SUTURE) IMPLANT
SUT MNCRL AB 4-0 PS2 18 (SUTURE) ×6 IMPLANT
SUT NOV 1 T60/GS (SUTURE) IMPLANT
SUT NOVA NAB DX-16 0-1 5-0 T12 (SUTURE) IMPLANT
SUT NOVA T20/GS 25 (SUTURE) IMPLANT
SUT PDS AB 1 CT1 27 (SUTURE) ×6 IMPLANT
SUT PDS AB 1 CTX 36 (SUTURE) ×6 IMPLANT
SUT PROLENE 2 0 CT2 30 (SUTURE) IMPLANT
SUT PROLENE 2 0 KS (SUTURE) IMPLANT
SUT SILK 2 0 (SUTURE) ×1
SUT SILK 2 0 SH CR/8 (SUTURE) ×3 IMPLANT
SUT SILK 2 0SH CR/8 30 (SUTURE) IMPLANT
SUT SILK 2-0 18XBRD TIE 12 (SUTURE) ×2 IMPLANT
SUT SILK 2-0 30XBRD TIE 12 (SUTURE) IMPLANT
SUT SILK 3 0 (SUTURE) ×1
SUT SILK 3 0 SH CR/8 (SUTURE) ×3 IMPLANT
SUT SILK 3-0 18XBRD TIE 12 (SUTURE) ×2 IMPLANT
SUT VIC AB 2-0 SH 18 (SUTURE) IMPLANT
SUT VIC AB 3-0 SH 18 (SUTURE) IMPLANT
SUT VICRYL 2 0 18  UND BR (SUTURE) ×2
SUT VICRYL 2 0 18 UND BR (SUTURE) ×4 IMPLANT
SYS LAPSCP GELPORT 120MM (MISCELLANEOUS)
SYSTEM LAPSCP GELPORT 120MM (MISCELLANEOUS) IMPLANT
TAPE CLOTH 4X10 WHT NS (GAUZE/BANDAGES/DRESSINGS) IMPLANT
TOWEL OR 17X26 10 PK STRL BLUE (TOWEL DISPOSABLE) ×6 IMPLANT
TOWEL OR NON WOVEN STRL DISP B (DISPOSABLE) ×3 IMPLANT
TRAY FOLEY MTR SLVR 14FR STAT (SET/KITS/TRAYS/PACK) ×3 IMPLANT
TRAY FOLEY MTR SLVR 16FR STAT (SET/KITS/TRAYS/PACK) IMPLANT
TROCAR ADV FIXATION 5X100MM (TROCAR) ×3 IMPLANT
TROCAR BALLN 12MMX100 BLUNT (TROCAR) ×3 IMPLANT
TROCAR XCEL NON-BLD 11X100MML (ENDOMECHANICALS) IMPLANT
TUBING CONNECTING 10 (TUBING) ×6 IMPLANT
YANKAUER SUCT BULB TIP NO VENT (SUCTIONS) ×3 IMPLANT

## 2019-05-07 NOTE — Anesthesia Procedure Notes (Signed)
Procedure Name: Intubation Performed by: Gean Maidens, CRNA Pre-anesthesia Checklist: Patient identified, Emergency Drugs available, Suction available, Patient being monitored and Timeout performed Patient Re-evaluated:Patient Re-evaluated prior to induction Oxygen Delivery Method: Circle system utilized Preoxygenation: Pre-oxygenation with 100% oxygen Induction Type: IV induction Ventilation: Mask ventilation without difficulty Laryngoscope Size: Mac and 4 Grade View: Grade III Tube type: Oral Tube size: 7.5 mm Number of attempts: 1 Airway Equipment and Method: Stylet Placement Confirmation: ETT inserted through vocal cords under direct vision,  positive ETCO2 and breath sounds checked- equal and bilateral Secured at: 23 cm Tube secured with: Tape Dental Injury: Teeth and Oropharynx as per pre-operative assessment  Difficulty Due To: Difficult Airway- due to dentition

## 2019-05-07 NOTE — Progress Notes (Signed)
PT Cancellation Note  Patient Details Name: John Vance MRN: [id number] DOB: [birth year]   Cancelled Treatment:    Reason Eval/Treat Not Completed: Medical issues which prohibited therapy. Per RN, pt recently transferring to the floor and fatigued, will hold evaluation until tomorrow.    Talbot Grumbling PT, DPT 05/07/19, 4:06 PM [phone]

## 2019-05-07 NOTE — Anesthesia Postprocedure Evaluation (Signed)
Anesthesia Post Note  Patient: Berley Tibbetts  Procedure(s) Performed: LAPAROSCOPIC RIGHT HEMI COLECTOMY (Right )     Patient location during evaluation: PACU Anesthesia Type: General Level of consciousness: awake and alert Pain management: pain level controlled Vital Signs Assessment: post-procedure vital signs reviewed and stable Respiratory status: spontaneous breathing, nonlabored ventilation, respiratory function stable and patient connected to nasal cannula oxygen Cardiovascular status: blood pressure returned to baseline and stable Postop Assessment: no apparent nausea or vomiting Anesthetic complications: no    Last Vitals:  Vitals:   05/07/19 1740 05/07/19 1827  BP: (!) 141/63 (!) 158/71  Pulse: 65 78  Resp: 16 16  Temp: (!) 35.8 C (!) 35.7 C  SpO2: 100% 100%    Last Pain:  Vitals:   05/07/19 1831  TempSrc:   PainSc: 5                  Tiajuana Amass

## 2019-05-07 NOTE — Transfer of Care (Signed)
Immediate Anesthesia Transfer of Care Note  Patient: [place]  Procedure(s) Performed: LAPAROSCOPIC RIGHT HEMI COLECTOMY (Right )  Patient Location: PACU  Anesthesia Type:General  Level of Consciousness: sedated, patient cooperative and responds to stimulation  Airway & Oxygen Therapy: Patient Spontanous Breathing and Patient connected to face mask oxygen  Post-op Assessment: Report given to RN and Post -op Vital signs reviewed and stable  Post vital signs: Reviewed and stable  Last Vitals:  Vitals Value Taken Time  BP    Temp    Pulse    Resp    SpO2      Last Pain:  Vitals:   05/07/19 0939  TempSrc:   PainSc: 0-No pain         Complications: No apparent anesthesia complications

## 2019-05-07 NOTE — Anesthesia Preprocedure Evaluation (Addendum)
Anesthesia Evaluation  Patient identified by MRN, date of birth, ID band Patient awake    Reviewed: Allergy & Precautions, NPO status , Patient's Chart, lab work & pertinent test results  Airway Mallampati: II  TM Distance: >3 FB     Dental  (+) Dental Advisory Given   Pulmonary neg pulmonary ROS,    breath sounds clear to auscultation       Cardiovascular negative cardio ROS   Rhythm:Regular Rate:Normal     Neuro/Psych negative neurological ROS     GI/Hepatic Neg liver ROS, Colon CA   Endo/Other  negative endocrine ROS  Renal/GU negative Renal ROS     Musculoskeletal   Abdominal   Peds  Hematology  (+) anemia ,   Anesthesia Other Findings   Reproductive/Obstetrics                             Lab Results  Component Value Date   WBC 6.8 05/02/2019   HGB 11.3 (L) 05/02/2019   HCT 38.1 (L) 05/02/2019   MCV 91.6 05/02/2019   PLT 252 05/02/2019   Lab Results  Component Value Date   CREATININE 0.90 05/02/2019   BUN 25 (H) 05/02/2019   NA 138 05/02/2019   K 4.3 05/02/2019   CL 105 05/02/2019   CO2 25 05/02/2019    Anesthesia Physical Anesthesia Plan  ASA: II  Anesthesia Plan: General   Post-op Pain Management:    Induction: Intravenous  PONV Risk Score and Plan: 2 and Dexamethasone, Ondansetron and Treatment may vary due to age or medical condition  Airway Management Planned: Oral ETT  Additional Equipment: None  Intra-op Plan:   Post-operative Plan: Extubation in OR  Informed Consent: I have reviewed the patients History and Physical, chart, labs and discussed the procedure including the risks, benefits and alternatives for the proposed anesthesia with the patient or authorized representative who has indicated his/her understanding and acceptance.     Dental advisory given  Plan Discussed with: CRNA  Anesthesia Plan Comments:         Anesthesia Quick  Evaluation

## 2019-05-07 NOTE — Consult Note (Signed)
[facility] Nurse requested for preoperative stoma site marking  Patient was marked on 05/02/19 and I verified with bedside RN in short stay that markings remain.  No further needs at this time.  [facility] team will follow.  Domenic Moras MSN, RN, FNP-BC CWON Wound, Ostomy, Continence Nurse Pager [phone]

## 2019-05-07 NOTE — H&P (Signed)
CC: Referred by Dr. Benay Spice for masses in cecum and transverse colon - here today for surgery  HPI: [name] is a very pleasant otherwise healthy 43yoM whom recently relocated from [place]. He lives now with his son. He reports having had hematochezia and underwent evaluation and was found to have a cancer in his sigmoid colon for which she underwent a segmental colectomy of 12/06/2018. He recovered well from this. This was an open procedure. He subsequently relocated to the [place] to live with his son and recover here. 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 No polyps. The tumor measured 4 cm. It invaded into the "superficial portion of the muscularis." There is no tumor perforation, perineural invasion, nor LVI. Margins were negative. There only 4 lymph nodes noted in the specimen. He had CT scans dating back 11/22/2018 that showed an asymmetric area 6 cm in size lesion in the sigmoid colon. No liver lesions. Right inguinal hernia. He was seen by Dr. Toney Rakes in [place], a medical oncologist. He was recommended to have adjuvant capecitabine after only having had 4 lymph nodes in the specimen. He however came and saw Dr. Benay Spice with medical oncology in [place] after arriving in the [place]. He was evaluated for possible adjuvant therapy and underwent routine lab work which demonstrated an elevated CEA. He therefore underwent CT scans of his chest/abdomne/pelvis. He was found to have masses in both the transverse colon and cecum. These on imaging certainly appear highly concerning for colon cancers. There is no evidence of metastatic disease in the chest/abdomen/pelvis. Our radiologists were able to review his imaging from [place] and at that time appears to have had masses in both the transverse colon and cecum. The masses are still present now. His CEA is elevated at 11.4. He reports he is otherwise healthy and takes no  medications. His albumin is 3.6. He denies any fevers/chills/nausea/vomiting. He has formed stool daily and denies any significant bleeding or melena. That said, he has had persistent iron deficiency anemia. He has a good functional status and is able walk up 2 flights of stairs or any difficulty. He denies any issues with incontinence to gas, liquid, or solid stool.  He underwent comprehenisve genetics evaluation and his entire genetics panel returned negative for any detectable heritable mutations.  Colonoscopy with Dr. Ardis Hughs 03/24/2019 showed the 2 masses in the colon to be adenocarcinoma - cecum and transverse colon. Tattoo placed distal to transverse colon mass. There were ~18 polyps between the masses. Distal to the transverse colon mass, 5 polyps were removed including a 1.5 cm polyp distal to sigmoid anastomosis. Polyps were cleared distal to transverse colon mass and resulted as tubular adenomas and a hyperplastic polyp  States he is ready for surgery today - denies any changes in his health or health history. Took his bowel prep as prescribed and had clear liquid stool at completion. Denies abdominal pain, bloating, nausea/vomiting.   PMH: Colon cancer (currently uncontrolled); iron deficiency anemia (partially controlled with PO iron + vit C)  PSH: Exploratory laparotomy with segmental colectomy and sigmoid colon Open appendectomy  FHx: Denies FHx of malignancy  Social: Denies use of drugs - former smoker but quit many years ago; social EtOH use. He is a former Civil engineer, contracting in [place]  ROS: A comprehensive 10 system review of systems was completed with the patient and pertinent findings as noted above.  Past Medical History:  Diagnosis Date  . Anemia   . Blood transfusion without reported  diagnosis   . Chronic kidney disease    kidney stones 40 years ago  . Colon cancer [facility])     Past Surgical History:  Procedure Laterality Date  . APPENDECTOMY    .  COLON SURGERY     in [place], Aug 2020  . TONSILLECTOMY      Family History  Problem Relation Age of Onset  . Breast cancer Mother 60       spread to pancreas  . Pancreatic cancer Mother   . Colon cancer Paternal Uncle 69  . Leukemia Sister 78       acute - died 2 months after diagnosis  . Cancer Niece 34       unknown cancer of the nose  . Stomach cancer Neg Hx   . Esophageal cancer Neg Hx   . Rectal cancer Neg Hx     Social:  reports that he has never smoked. He has never used smokeless tobacco. He reports current alcohol use of about 3.0 - 4.0 standard drinks of alcohol per week. He reports that he does not use drugs.  Allergies: No Known Allergies  Medications: I have reviewed the patient's current medications.  No results found for this or any previous visit (from the past 48 hour(s)).  No results found.  ROS - all of the below systems have been reviewed with the patient and positives are indicated with bold text General: chills, fever or night sweats Eyes: blurry vision or double vision ENT: epistaxis or sore throat Allergy/Immunology: itchy/watery eyes or nasal congestion Hematologic/Lymphatic: bleeding problems, blood clots or swollen lymph nodes Endocrine: temperature intolerance or unexpected weight changes Breast: new or changing breast lumps or nipple discharge Resp: cough, shortness of breath, or wheezing CV: chest pain or dyspnea on exertion GI: as per HPI GU: dysuria, trouble voiding, or hematuria MSK: joint pain or joint stiffness Neuro: TIA or stroke symptoms Derm: pruritus and skin lesion changes Psych: anxiety and depression  PE There were no vitals taken for this visit. Constitutional: NAD Eyes: Moist conjunctiva; no lid lag; anicteric; pupils equal and round Lungs: Normal respiratory effort CV: RRR; no pitting edema GI: Abd soft, NT/ND; no palpable hepatosplenomegaly Psychiatric: Appropriate affect; alert and oriented x3 Lymphatic: No  palpable cervical or axillary lymphadenopathy  No results found for this or any previous visit (from the past 48 hour(s)).  No results found.  A/P: [name] i John Vance very pleasant 18yoM with hx of sigmoid colon cancer - s/p segmental colectomy [place] 12/06/2018 - now with metachronous adenoCA in both his mid transverse colon as well as cecum.  -We have reviewed the anatomy, physiology, pathophysiology of the GI tract and colon cancer. -We discussed laparoscopic possible open right hemicolectomy, possible subtotal colectomy based on intraoperative findings and perfusion. We discussed possible ileo-distal sigmoid anastomosis (or ileorectal). We also discussed possible flexible sigmoidoscopy if subtotal colectomy necessary to assess anastomosis. His current staple line appears to be in his mid-sigmoid colon on his CT scan. We discussed scenarios where people may end up requiring an ostomy as well. -We had a long discussion about postoperative quality of life related issues particularly in the setting of an ileorectal or ileosigmoid anastomosis. We discussed 3-5 bowel movements per day being the norm. We discussed frequency and fecal urgency with this configuration. Discussed technical concerns with surgery including anastomotic leak and possibilities for stomas around the time of surgery. -The planned procedure, material risks (including, but not limited to, pain, bleeding, infection, scarring, need for  blood transfusion, damage to surrounding structures- blood vessels/nerves/viscus/organs, damage to ureter, urine leak, leak from anastomosis, need for additional procedures, need for stoma which may be permanent, worsening of pre-existing medical conditions, hernia, recurrence, pneumonia, heart attack, stroke, death) benefits and alternatives to surgery were discussed at length. The patient's questions were answered to his satisfaction, he voiced understanding and elected to proceed with surgery.  Additionally, we discussed typical postopeative expectations and the recovery process.  Sharon Mt. Dema Severin, M.D. [facility]

## 2019-05-07 NOTE — Op Note (Signed)
PATIENT: John Vance  79 y.o. male  Patient Care Team: Ladell Pier, MD as PCP - General (Oncology)  PREOP DIAGNOSIS: 1. Colon cancer - cecum and transverse colon 2. Personal history of colon cancer, sigmoid  POSTOP DIAGNOSIS: Same  PROCEDURE: 1. Laparoscopic extended right hemicolectomy 2. Laparoscopic lysis of adhesions x 90 minutes  SURGEON: Sharon Mt. Ardell Makarewicz, MD  ASSISTANT: Leighton Ruff, MD  ANESTHESIA: General endotracheal  EBL: 50 mL Total I/O In: 2350 [I.V.:2000; IV Piggyback:350] Out: 200 [Urine:150; Blood:50]  DRAINS: None  SPECIMEN: Terminal ileum, right colon, transverse colon with associated omentum - all en bloc  COUNTS: Sponge, needle and instrument counts were reported correct x2  FINDINGS: No obvious metastatic disease on visceral parietal peritoneum or liver. Tattoo distal to transverse colon mass. Mass in cecum. Significant adhesions between terminal ileum and sigmoid mesocolon; omentum and midline. Middle colic pedicle was clamped before dividing and perfusion to remaining transverse colon was present with great doppler signal. Therefore, subtotal colectomy was not performed and an extended right hemicolectomy was carried out. Stapled ileocolic anastomosis fashioned to mid/distal transverse colon.  STATEMENT OF MEDICAL NECESSITY: [name] is a 79 y.o. male with history of colon cancer managed in [place] 5 months ago - underwent a segmental sigmoid colectomy 12/06/2018. He recovered well from this. This was an open procedure. He subsequently relocated to the [place] to live with his son and recover here. 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 No polyps. The tumor measured 4 cm. It invaded into the "superficial portion of the muscularis." There is no tumor perforation, perineural invasion, nor LVI. Margins were negative. There only 4 lymph nodes noted in the specimen. He  had CT scans dating back 11/22/2018 that showed an asymmetric area 6 cm in size lesion in the sigmoid colon. No liver lesions. Right inguinal hernia. He was seen by Dr. Toney Rakes in [place], a medical oncologist. He was recommended to have adjuvant capecitabine after only having had 4 lymph nodes in the specimen. He however came and saw Dr. Benay Spice with medical oncology in [place] after arriving in the [place]. He was evaluated for possible adjuvant therapy and underwent routine lab work which demonstrated an elevated CEA. He therefore underwent CT scans of his chest/abdomne/pelvis. He was found to have masses in both the transverse colon and cecum. These on imaging certainly appear highly concerning for colon cancers. There is no evidence of metastatic disease in the chest/abdomen/pelvis. Our radiologists were able to review his imaging from [place] and at that time appears to have had masses in both the transverse colon and cecum. The masses are still present now. His CEA is elevated at 11.4. He reports he is otherwise healthy and takes no medications. His albumin is 3.6. He denies any fevers/chills/nausea/vomiting. He has formed stool daily and denies any significant bleeding or melena. That said, he has had persistent iron deficiency anemia. He has a good functional status and is able walk up 2 flights of stairs or any difficulty. He denies any issues with incontinence to gas, liquid, or solid stool.  He underwent comprehenisve genetics evaluation and his entire genetics panel returned negative for any detectable heritable mutations.  Colonoscopy with Dr. Ardis Hughs 03/24/2019 showed the 2 masses in the colon to be adenocarcinoma - cecum and transverse colon. Tattoo placed distal to transverse colon mass. There were ~18 polyps between the masses. Distal to the transverse colon mass, 5 polyps were removed including a 1.5 cm polyp  distal to sigmoid anastomosis. Polyps were cleared distal to  transverse colon mass and resulted as tubular adenomas and a hyperplastic polyp  We had reviewed options moving forward and he opted to pursue surgery - please refer to notes elsewhere for details regarding these discussions.  NARRATIVE:  The patient was identified & brought into the operating room, placed supine on the operating table and SCDs were applied to the lower extremities. General endotracheal anesthesia was induced. The patient was positioned supine with arms tucked. Antibiotics were administered. A foley catheter was placed under sterile conditions. Hair in the region of planned surgery was clipped. The abdomen was prepped and draped in a sterile fashion. A timeout was performed confirming our patient and plan.   Beginning with the extraction port well above prior surgical incision, a supraumbilical incision was made and carried down to the midline fascia. This was then incised with electrocautery. The peritoneum was identified and elevated between clamps and carefully opened sharply. A small Alexis wound protector with a cap and associated port was then placed. The abdomen was insufflated to 15 mmHg with Co2. A laparoscope was placed and camera inspection revealed no evidence of injury. Bilateral TAP blocks were then performed under laparoscopic visualization using a mixture of 0.25% marcaine with epinepherine + Exparel. 4 additional 5 mm ports were then placed under direct laparoscopic visualization - two in the left hemiabdomen, one in the supraumbilical position and one in the right abdomen. The abdomen was surveyed. There were a fair amount of omental adhesions to midline which was carefully taken down with endoshears. Hemostasis in the omentum then verified. The liver and peritoneum appeared normal.  There were no signs of metastatic disease. Tattoo was seen in mid/distal transverse colon. Masses present in cecum and transverse colon.  The patient was positioned in trendelenburg with left  side down.  From his prior open appendectomy, there were adhesions between the cecum and the right lower quadrant abdominal wall.  These were carefully taken down sharply using EndoShears.  There is also significant adhesions between the cecum and ascending colon and it appeared as though his colon had previously been mobilized in this location.  There is also adhesions between the ascending mesocolon and the duodenum which appeared somewhat thick/acute in appearance but were easily separated both from the necessary structures.  The duodenum was identified and preserved.  There were also adhesions between the terminal ileum and the sigmoid mesocolon.  These were carefully separated sharply using EndoShears.  Working up towards the hepatic flexure, the ascending colon was fully mobilized.  He was then repositioned in reverse Trendelenburg.  The omentum was somewhat adherent to both the gallbladder and the transverse colon.  This was carefully taken down sharply.  The gallbladder was inspected and noted to be free of injury.  The lesser sac was then entered taking the transverse omentum off of the transverse colon.  The hepatic flexure was then fully mobilized.  The duodenum was again identified and inspected and noted to be free of injury. The ileocolic pedicle was identified. Gentle blunt dissection commenced around the pedicle, duodenum was identified confirmed to be away. Using the Enseal device, the ileocolic pedicle was divided.  The stump was inspected and noted to be hemostatic.  At this point, the abdomen was desufflated and the terminal ileum and right colon delivered through the wound protector. The terminal ileum was then transected using a GIA blue load stapler.  A clamp was placed on the terminal ileum to maintain  orientation.  The remaining mesentery was divided using the Enseal device. The divided mesentery was inspected and noted to be hemostatic. The distal point of transection was then identified  on the transverse colon.  This was grossly 5 cm distal to the transverse colon mass.  This was just distal to the middle colic pedicle.  Given these findings, for further planning, the middle colic pedicle was identified and circumferentially dissected.  Using a noncrushing clamp this was test ligated by gently applying this clamp.  Perfusion was assessed using the Doppler in the middle colic vessel and the clamp appeared to be adequate with no forward flow at this point.  The transverse colon mesentery was inspected and had excellent, triphasic Doppler signal indicating more than adequate perfusion.  The clamp was then released.  The middle colic pedicle was then ligated using the Enseal device.  Additionally, a 2-0 silk suture was placed to ensure hemostasis.    At the planned location of vision, a window was created mesentery and the transverse colon divided using a GIA blue load 75 mm stapler.  The remaining mesentery on the colon specimen was divided using the Enseal.  The mesentery was inspected and noted to hemostatic. There was also a palpable pulse in the transverse colon mesentery. The specimen was then passed off. Attention was turned to creating the anastomosis. The terminal ileum and transverse colon were inspected for orientation to ensure no twisting nor bowel included in the mesenteric defect. An anastomosis was created between the terminal ileum and the transverse colon using a 75 mm GIA blue load stapler. The staple line was inspected and noted to be hemostatic.  The common enterotomy channel was closed using a TA 60 blue load stapler. Hemostasis was achieved at the staple line using 3-0 silk U-stitches. 3-0 silk sutures were used to imbricate the corners of the staple line as well.  A 3-0 silk suture was placed securing the "crotch" of the anastomosis. The anastomosis was palpated and noted to be widely patent. This was then placed back into the abdomen and covered with omentum. The abdomen was  then irrigated with sterile saline and hemostasis verified. The wound protector cap was replaced and CO2 reinsufflated. The laparoscopic ports were removed under direct visualization and the sites noted to be hemostatic. The Alexis wound protector was removed, counts were reported correct x2, and we switched to clean instruments, gowns and drapes.   The fascia was then closed using two running #1 PDS sutures.  The skin of all incision sites was closed with 4-0 monocryl subcuticular suture. Dermabond was placed on the port sites and a sterile dressing was placed over the abdominal incision. All counts were again reported correct. He was then awakened from anesthesia, extubated, and transferred to stretcher for transport to PACU in satisfactory condition.   DISPOSITION: PACU in satisfactory condition

## 2019-05-08 LAB — BASIC METABOLIC PANEL
Anion gap: 6 (ref 5–15)
BUN: 12 mg/dL (ref 8–23)
CO2: 24 mmol/L (ref 22–32)
Calcium: 8.5 mg/dL — ABNORMAL LOW (ref 8.9–10.3)
Chloride: 104 mmol/L (ref 98–111)
Creatinine, Ser: 0.8 mg/dL (ref 0.61–1.24)
GFR calc Af Amer: >60 mL/min (ref >60)
GFR calc non Af Amer: >60 mL/min (ref >60)
Glucose, Bld: 144 mg/dL — ABNORMAL HIGH (ref 70–99)
Potassium: 4.4 mmol/L (ref 3.5–5.1)
Sodium: 134 mmol/L — ABNORMAL LOW (ref 135–145)

## 2019-05-08 LAB — CBC
HCT: 33.5 % — ABNORMAL LOW (ref 39.0–52.0)
Hemoglobin: 10 g/dL — ABNORMAL LOW (ref 13.0–17.0)
MCH: 26.9 pg (ref 26.0–34.0)
MCHC: 29.9 g/dL — ABNORMAL LOW (ref 30.0–36.0)
MCV: 90.1 fL (ref 80.0–100.0)
Platelets: 247 10*3/uL (ref 150–400)
RBC: 3.72 MIL/uL — ABNORMAL LOW (ref 4.22–5.81)
RDW: 16 % — ABNORMAL HIGH (ref 11.5–15.5)
WBC: 10 10*3/uL (ref 4.0–10.5)
nRBC: 0 % (ref 0.0–0.2)

## 2019-05-08 LAB — PHOSPHORUS: Phosphorus: 2.6 mg/dL (ref 2.5–4.6)

## 2019-05-08 LAB — MAGNESIUM: Magnesium: 1.8 mg/dL (ref 1.7–2.4)

## 2019-05-08 NOTE — Evaluation (Signed)
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 Colonoscopy on 03/24/2019 showed the 2 masses in the colon to be adenocarcinoma - cecum and transverse colon. Colonoscopy with Dr. Ardis Hughs 03/24/2019 showed the 2 masses in the colon to be adenocarcinoma - cecum and transverse colon. Pt is now s/p Laparoscopic extended right hemicolectomy.   Clinical Impression  Pt ambulated 100' with RW then 300' without an assistive device, no loss of balance. He is independent with mobility, no further PT indicated. Encouraged pt to ambulate in halls several times/day. PT signing off.     Follow Up Recommendations No PT follow up    Equipment Recommendations  None recommended by PT    Recommendations for Other Services       Precautions / Restrictions Precautions Precautions: Fall Restrictions Weight Bearing Restrictions: No      Mobility  Bed Mobility Overal bed mobility: Modified Independent Bed Mobility: Rolling;Sidelying to Sit Rolling: Supervision Sidelying to sit: Supervision       General bed mobility comments: pt used bed rail. logrolled. supervision for safety  Transfers Overall transfer level: Independent Equipment used: Rolling walker (2 wheeled) Transfers: Sit to/from Stand Sit to Stand: Independent         General transfer comment: to/from EOB and recliner. min guard for safety  Ambulation/Gait Ambulation/Gait assistance: Independent Gait Distance (Feet): 400 Feet Assistive device: None;Rolling walker (2 wheeled) Gait Pattern/deviations: WFL(Within Functional Limits) Gait velocity: WNL   General Gait Details: 100' with RW,  then 300' without an assistive device, no loss of balance  Stairs            Wheelchair Mobility    Modified Rankin (Stroke Patients Only)       Balance Overall balance assessment: Needs assistance Sitting-balance support: Feet supported;No upper extremity supported Sitting balance-Leahy Scale: Good     Standing balance support: No upper extremity supported Standing balance-Leahy Scale: Good Standing balance comment: able to static stand without external support. pt reports he feels more steady, for now, using rw for walking.                              Pertinent Vitals/Pain Pain Assessment: Faces Faces Pain Scale: Hurts a little bit Pain Location: abdomen (surgical site) and R testicle Pain Descriptors / Indicators: Aching Pain Intervention(s): Limited activity within patient's tolerance;Monitored during session;Other (comment)(provided mesh underwear to support testicles)    Home Living Family/patient expects to be discharged to:: Private residence Living Arrangements: Spouse/significant other Available Help at Discharge: Family;Available 24 hours/day Type of Home: House Home Access: Level entry     Home Layout: One level Home Equipment: None Additional Comments: Pt lives with spouse. Son lives across the street.     Prior Function Level of Independence: Independent         Comments: active, enjoys walking     Hand Dominance        Extremity/Trunk Assessment   Upper Extremity Assessment Upper Extremity Assessment: Defer to OT evaluation    Lower Extremity Assessment Lower Extremity Assessment: Overall WFL for tasks assessed    Cervical / Trunk Assessment Cervical / Trunk Assessment: Normal  Communication  Communication: Prefers language other than [place];Other (comment)(son translating per pt request)  Cognition Arousal/Alertness: Awake/alert Behavior During Therapy: WFL for tasks assessed/performed Overall Cognitive Status:  Within Functional Limits for tasks assessed                                        General Comments      Exercises     Assessment/Plan    PT Assessment Patent does not need any further PT services  PT Problem List         PT Treatment Interventions      PT Goals (Current goals can be found in the Care Plan section)  Acute Rehab PT Goals Patient Stated Goal: return home, regain independence and prior activity level. likes walking PT Goal Formulation: All assessment and education complete, DC therapy    Frequency     Barriers to discharge        Co-evaluation               AM-PAC PT "6 Clicks" Mobility  Outcome Measure Help needed turning from your back to your side while in a flat bed without using bedrails?: None Help needed moving from lying on your back to sitting on the side of a flat bed without using bedrails?: None Help needed moving to and from a bed to a chair (including a wheelchair)?: None Help needed standing up from a chair using your arms (e.g., wheelchair or bedside chair)?: None Help needed to walk in hospital room?: None Help needed climbing 3-5 steps with a railing? : None 6 Click Score: 24    End of Session Equipment Utilized During Treatment: Gait belt Activity Tolerance: Patient tolerated treatment well Patient left: with call bell/phone within reach(in bathroom) Nurse Communication: Mobility status      Time: [id number] PT Time Calculation (min) (ACUTE ONLY): 10 min   Charges:   PT Evaluation $PT Eval Low Complexity: 1 Low          Philomena Doheny PT 05/08/2019  Acute Rehabilitation Services Pager [phone] Office [phone]

## 2019-05-08 NOTE — Plan of Care (Signed)

## 2019-05-08 NOTE — Evaluation (Signed)
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 Colonoscopy on 03/24/2019 showed the 2 masses in the colon to be adenocarcinoma - cecum and transverse colon. Colonoscopy with Dr. Ardis Hughs 03/24/2019 showed the 2 masses in the colon to be adenocarcinoma - cecum and transverse colon. Pt is now s/p Laparoscopic extended right hemicolectomy.    Clinical Impression   Pt admitted with the above diagnoses and presents with below problem list. Pt will benefit from continued acute OT to address the below listed deficits and maximize independence with basic ADLs prior to d/c home. PTA pt was independent with ADLs. He lives with his spouse; son lives directly across the street. Pt is currently min guard with LB ADLs and functional transfers/mobility. Walked full length of unit hallway utilizing rw for support. Of note, pt reporting pain in R testicle towards end of session, nursing notified.     Follow Up Recommendations  Home health OT;Supervision - Intermittent(OOB/mobility)    Equipment Recommendations  3 in 1 bedside commode    Recommendations for Other Services PT consult     Precautions / Restrictions Precautions Precautions: Fall Restrictions Weight Bearing Restrictions: No      Mobility Bed Mobility Overal bed mobility: Needs Assistance Bed Mobility: Rolling;Sidelying to Sit Rolling: Supervision Sidelying to sit: Supervision       General bed mobility comments: pt used bed rail. logrolled. supervision for safety  Transfers Overall transfer level: Needs assistance Equipment used: Rolling walker (2 wheeled) Transfers: Sit to/from  Stand Sit to Stand: Min guard         General transfer comment: to/from EOB and recliner. min guard for safety    Balance Overall balance assessment: Needs assistance Sitting-balance support: No upper extremity supported;Feet supported Sitting balance-Leahy Scale: Good     Standing balance support: Bilateral upper extremity supported;No upper extremity supported Standing balance-Leahy Scale: Fair Standing balance comment: able to static stand without external support. pt reports he feels more steady, for now, using rw for walking.                            ADL either performed or assessed with clinical judgement   ADL Overall ADL's : Needs assistance/impaired Eating/Feeding: Set up;Sitting   Grooming: Set up;Min guard;Sitting;Standing   Upper Body Bathing: Set up;Sitting   Lower Body Bathing: Min guard;Sit to/from stand   Upper Body Dressing : Set up;Sitting   Lower Body Dressing: Min guard;Sit to/from stand   Toilet Transfer: Min guard;Ambulation   Toileting- Clothing Manipulation and Hygiene: Min guard   Tub/ Shower Transfer: Walk-in shower;Min guard;Rolling walker   Functional mobility during ADLs: Min guard;Rolling walker General ADL Comments: Pt eager to walk in the halls. min guard for safetyy with LB ADLs and OOB mobility. Discussed LB ADL technique.      Vision         Perception     Praxis      Pertinent Vitals/Pain Pain Assessment: Faces Faces Pain Scale: Hurts a little bit Pain Location: abdomen, R testicle Pain Descriptors / Indicators: Sore Pain Intervention(s): Monitored during session;Other (comment)(notified RN of R testicle pain)     Hand Dominance  Extremity/Trunk Assessment Upper Extremity Assessment Upper Extremity Assessment: Overall WFL for tasks assessed   Lower Extremity Assessment Lower Extremity Assessment: Defer to PT evaluation   Cervical / Trunk Assessment Cervical / Trunk Assessment: Normal    Communication Communication Communication: Prefers language other than [place];Other (comment)(son translating per pt request)   Cognition Arousal/Alertness: Awake/alert Behavior During Therapy: WFL for tasks assessed/performed Overall Cognitive Status: Within Functional Limits for tasks assessed                                     General Comments       Exercises     Shoulder Instructions      Home Living Family/patient expects to be discharged to:: Private residence Living Arrangements: Spouse/significant other Available Help at Discharge: Family;Available 24 hours/day Type of Home: House Home Access: Level entry     Home Layout: One level     Bathroom Shower/Tub: Walk-in shower         Home Equipment: None   Additional Comments: Pt lives with spouse. Son lives across the street.       Prior Functioning/Environment Level of Independence: Independent        Comments: active, enjoys walking        OT Problem List: Impaired balance (sitting and/or standing);Decreased knowledge of use of DME or AE;Decreased knowledge of precautions;Pain      OT Treatment/Interventions: Self-care/ADL training;DME and/or AE instruction;Therapeutic activities;Patient/family education;Balance training    OT Goals(Current goals can be found in the care plan section) Acute Rehab OT Goals Patient Stated Goal: return home, regain independence and prior activity level. likes walking OT Goal Formulation: With patient/family Time For Goal Achievement: 05/22/19 Potential to Achieve Goals: Good ADL Goals Pt Will Perform Lower Body Bathing: sit to/from stand;with modified independence Pt Will Perform Lower Body Dressing: with modified independence;sit to/from stand Pt Will Transfer to Toilet: with modified independence;ambulating Pt Will Perform Toileting - Clothing Manipulation and hygiene: with modified independence;sit to/from stand Pt Will Perform Tub/Shower  Transfer: Shower transfer;with modified independence;ambulating;3 in 1;rolling walker  OT Frequency: Min 2X/week   Barriers to D/C:            Co-evaluation              AM-PAC OT "6 Clicks" Daily Activity     Outcome Measure Help from another person eating meals?: None Help from another person taking care of personal grooming?: None Help from another person toileting, which includes using toliet, bedpan, or urinal?: None Help from another person bathing (including washing, rinsing, drying)?: A Little Help from another person to put on and taking off regular upper body clothing?: None Help from another person to put on and taking off regular lower body clothing?: A Little 6 Click Score: 22   End of Session Equipment Utilized During Treatment: Rolling walker Nurse Communication: Other (comment)(R testicle pain)  Activity Tolerance: Patient tolerated treatment well Patient left: in chair;with call bell/phone within reach;with family/visitor present  OT Visit Diagnosis: Unsteadiness on feet (R26.81);Pain                Time: [id number] OT Time Calculation (min): 16 min Charges:  OT General Charges $OT Visit: 1 Visit OT Evaluation $OT Eval Low Complexity: [place], OT Acute Rehabilitation Services Pager: [phone] Office: [phone]  Hortencia Pilar 05/08/2019, 12:40 PM

## 2019-05-08 NOTE — Progress Notes (Signed)
Subjective No acute events. Feeling quite well. Denies n/v. Tolerated clears without issue. Denies flatus/Bm as of yet.  Objective: Vital signs in last 24 hours: Temp:  [96 F (35.6 C)-98.5 F (36.9 C)] 98.2 F (36.8 C) (01/14 0425) Pulse Rate:  [64-102] 72 (01/14 0425) Resp:  [13-20] 16 (01/14 0425) BP: (119-159)/(56-82) 138/56 (01/14 0425) SpO2:  [96 %-100 %] 96 % (01/14 0425)    Intake/Output from previous day: 01/13 0701 - 01/14 0700 In: 3008.8 [I.V.:2658.8; IV Piggyback:350] Out: 1625 [id number]; Blood:50] Intake/Output this shift: No intake/output data recorded.  Gen: NAD, comfortable CV: RRR Pulm: Normal work of breathing Abd: Soft, NT/ND. Incisions c/d/i without erythema nor drainage Ext: SCDs in place  Lab Results: CBC  Recent Labs    05/08/19 0345  WBC 10.0  HGB 10.0*  HCT 33.5*  PLT 247   BMET Recent Labs    05/08/19 0345  NA 134*  K 4.4  CL 104  CO2 24  GLUCOSE 144*  BUN 12  CREATININE 0.80  CALCIUM 8.5*   PT/INR No results for input(s): LABPROT, INR in the last 72 hours. ABG No results for input(s): PHART, HCO3 in the last 72 hours.  Invalid input(s): PCO2, PO2  Studies/Results:  Anti-infectives: Anti-infectives (From admission, onward)   Start     Dose/Rate Route Frequency Ordered Stop   05/07/19 1400  neomycin (MYCIFRADIN) tablet 1,000 mg  Status:  Discontinued     1,000 mg Oral 3 times per day 05/07/19 0857 05/07/19 0907   05/07/19 1400  metroNIDAZOLE (FLAGYL) tablet 1,000 mg  Status:  Discontinued     1,000 mg Oral 3 times per day 05/07/19 0857 05/07/19 0907   05/07/19 0900  cefoTEtan (CEFOTAN) 2 g in sodium chloride 0.9 % 100 mL IVPB     2 g 200 mL/hr over 30 Minutes Intravenous On call to O.R. 05/07/19 0857 05/07/19 1102       Assessment/Plan: Patient Active Problem List   Diagnosis Date Noted  . Colon cancer ([place]) 05/07/2019  . Genetic testing 02/27/2019  . Family history of leukemia   . Family history of breast  cancer   . Family history of colon cancer   . Family history of cancer   . Cancer of sigmoid colon ([place]) 02/10/2019  S/P extended right hemicolectomy for cecal + transverse colon adenocarcinoma - POD#1  -Path pending -We reviewed his procedure, findings and answered his questions to his satisfaction -Ambulate in hallway - 5x/day -Full liquids; advance to soft diet as tolerated -D/C foley, D/C MIVF -Continue Entereg until return of bowel function -PPx: SQH, SCDs   LOS: 1 day   Sharon Mt. Dema Severin, M.D. [facility] Use [email or web address] to contact on call provider

## 2019-05-09 ENCOUNTER — Telehealth: Payer: Self-pay | Admitting: Oncology

## 2019-05-09 ENCOUNTER — Other Ambulatory Visit: Payer: Self-pay

## 2019-05-09 LAB — BASIC METABOLIC PANEL
Anion gap: 6 (ref 5–15)
BUN: 19 mg/dL (ref 8–23)
CO2: 26 mmol/L (ref 22–32)
Calcium: 8.5 mg/dL — ABNORMAL LOW (ref 8.9–10.3)
Chloride: 106 mmol/L (ref 98–111)
Creatinine, Ser: 1.03 mg/dL (ref 0.61–1.24)
GFR calc Af Amer: >60 mL/min (ref >60)
GFR calc non Af Amer: >60 mL/min (ref >60)
Glucose, Bld: 108 mg/dL — ABNORMAL HIGH (ref 70–99)
Potassium: 4.1 mmol/L (ref 3.5–5.1)
Sodium: 138 mmol/L (ref 135–145)

## 2019-05-09 LAB — CBC
HCT: 30.2 % — ABNORMAL LOW (ref 39.0–52.0)
Hemoglobin: 9 g/dL — ABNORMAL LOW (ref 13.0–17.0)
MCH: 27.2 pg (ref 26.0–34.0)
MCHC: 29.8 g/dL — ABNORMAL LOW (ref 30.0–36.0)
MCV: 91.2 fL (ref 80.0–100.0)
Platelets: 204 10*3/uL (ref 150–400)
RBC: 3.31 MIL/uL — ABNORMAL LOW (ref 4.22–5.81)
RDW: 16.3 % — ABNORMAL HIGH (ref 11.5–15.5)
WBC: 7.9 10*3/uL (ref 4.0–10.5)
nRBC: 0 % (ref 0.0–0.2)

## 2019-05-09 MED ORDER — TRAMADOL HCL 50 MG PO TABS: 50.0000 mg | ORAL_TABLET | Freq: Four times a day (QID) | ORAL | 0 refills | Status: AC | PRN

## 2019-05-09 MED ORDER — PNEUMOCOCCAL VAC POLYVALENT 25 MCG/0.5ML IJ INJ
0.5000 mL | INJECTION | Freq: Once | INTRAMUSCULAR | Status: AC
  Administered 2019-05-09: 0.5 mL via INTRAMUSCULAR
  Filled 2019-05-09: qty 0.5

## 2019-05-09 NOTE — Discharge Summary (Signed)
Patient ID: [facility] [name] MRN: [id number] DOB/AGE: June 08, 1940 79 y.o.  Admit date: 05/07/2019 Discharge date: 05/09/2019  Discharge Diagnoses Patient Active Problem List   Diagnosis Date Noted  . Colon cancer ([place]) 05/07/2019  . Genetic testing 02/27/2019  . Family history of leukemia   . Family history of breast cancer   . Family history of colon cancer   . Family history of cancer   . Cancer of sigmoid colon ([place]) 02/10/2019    Consultants None  Procedures Laparoscopic extended right hemicolectomy, lysis of adhesions 05/07/2019 for synchronous adenocarcinomas in the ascending and transverse colon.  FINDINGS: No obvious metastatic disease on visceral parietal peritoneum or liver. Tattoo distal to transverse colon mass. Mass in cecum. Significant adhesions between terminal ileum and sigmoid mesocolon; omentum and midline. Middle colic pedicle was clamped before dividing and perfusion to remaining transverse colon was present with great doppler signal. Therefore, subtotal colectomy was not performed and an extended right hemicolectomy was carried out. Stapled ileocolic anastomosis fashioned to mid/distal transverse colon.   Hospital Course: He was admitted postoperatively where he recovered quite well. His diet as advanced and he had great appetite and tolerated his diet well. On POD#1, he began passing gas and having multiple bowel movements. This continued. He denied any n/v. He denied any abdominal pain or bloating. He was up walking the halls on his own on POD#1. He cleared PT. He was offered home OT but does not appear to be interested in this at this time - states he lives with his wife and his son is his neighbor. He feels like he can perform all his ADLs. He has a walker at home his son purchased as well but states he has been getting around in the hospital without this and feels stable on his feet. We discussed options and he does not express interest in any home health  services. He was deemed stable for discharge home   Allergies as of 05/09/2019   No Known Allergies     Medication List    TAKE these medications   ferrous sulfate 325 (65 FE) MG EC tablet Take 1 tablet (325 mg total) by mouth 2 (two) times daily with a meal. What changed:   how much to take  when to take this   traMADol 50 MG tablet Commonly known as: Ultram Take 1 tablet (50 mg total) by mouth every 6 (six) hours as needed for up to 5 days (severe postop pain not controlled with tylenol and ibuprofen).   vitamin C 1000 MG tablet Take 1,000 mg by mouth daily.        Follow-up Information    Ileana Roup, MD Follow up in 3 week(s).   Specialty: General Surgery Contact information: [place] [address] [phone]           Reka Wist M. Dema Severin, M.D. [facility]

## 2019-05-09 NOTE — Telephone Encounter (Signed)
Scheduled per 1/14 sch msg. Called and spoke with Jacqulyn Bath (son), confirmed 2/1 appt

## 2019-05-09 NOTE — Plan of Care (Signed)
  Problem: Education: Goal: Required Educational Video(s) Outcome: Adequate for Discharge   Problem: Clinical Measurements: Goal: Postoperative complications will be avoided or minimized Outcome: Adequate for Discharge   Problem: Skin Integrity: Goal: Demonstration of wound healing without infection will improve Outcome: Adequate for Discharge   Problem: Education: Goal: Knowledge of General Education information will improve Description: Including pain rating scale, medication(s)/side effects and non-pharmacologic comfort measures Outcome: Adequate for Discharge   Problem: Health Behavior/Discharge Planning: Goal: Ability to manage health-related needs will improve Outcome: Adequate for Discharge   Problem: Health Behavior/Discharge Planning: Goal: Ability to manage health-related needs will improve Outcome: Adequate for Discharge   Problem: Clinical Measurements: Goal: Ability to maintain clinical measurements within normal limits will improve Outcome: Adequate for Discharge Goal: Will remain free from infection Outcome: Adequate for Discharge Goal: Diagnostic test results will improve Outcome: Adequate for Discharge Goal: Respiratory complications will improve Outcome: Adequate for Discharge Goal: Cardiovascular complication will be avoided Outcome: Adequate for Discharge   Problem: Activity: Goal: Risk for activity intolerance will decrease Outcome: Adequate for Discharge   Problem: Nutrition: Goal: Adequate nutrition will be maintained Outcome: Adequate for Discharge   Problem: Elimination: Goal: Will not experience complications related to bowel motility Outcome: Adequate for Discharge Goal: Will not experience complications related to urinary retention Outcome: Adequate for Discharge   Problem: Coping: Goal: Level of anxiety will decrease Outcome: Adequate for Discharge   Problem: Pain Managment: Goal: General experience of comfort will improve Outcome:  Adequate for Discharge   Problem: Safety: Goal: Ability to remain free from injury will improve Outcome: Adequate for Discharge

## 2019-05-09 NOTE — Discharge Instructions (Signed)
POST OP INSTRUCTIONS AFTER COLON SURGERY  1. DIET: Be sure to include lots of fluids daily to stay hydrated - 64oz of water per day (8, 8 oz glasses).  Avoid fast food or heavy meals for the first couple of weeks as your are more likely to get nauseated. Avoid raw/uncooked fruits or vegetables for the first 4 weeks (its ok to have these if they are blended into smoothie form). If you have fruits/vegetables, make sure they are cooked until soft enough to mash on the roof of your mouth and chew your food well. Otherwise, diet as tolerated.  2. Take your usually prescribed home medications unless otherwise directed.  3. PAIN CONTROL: a. Pain is best controlled by a usual combination of three different methods TOGETHER: i. Ice/Heat ii. Over the counter pain medication iii. Prescription pain medication b. Most patients will experience some swelling and bruising around the surgical site.  Ice packs or heating pads (30-60 minutes up to 6 times a day) will help. Some people prefer to use ice alone, heat alone, alternating between ice & heat.  Experiment to what works for you.  Swelling and bruising can take several weeks to resolve.   c. It is helpful to take an over-the-counter pain medication regularly for the first few weeks: i. Ibuprofen (Motrin/Advil) - 200mg tabs - take 3 tabs (600mg) every 6 hours as needed for pain (unless you have been directed previously to avoid NSAIDs/ibuprofen) ii. Acetaminophen (Tylenol) - you may take 650mg every 6 hours as needed. You can take this with motrin as they act differently on the body. If you are taking a narcotic pain medication that has acetaminophen in it, do not take over the counter tylenol at the same time. iii. NOTE: You may take both of these medications together - most patients  find it most helpful when alternating between the two (i.e. Ibuprofen at 6am, tylenol at 9am, ibuprofen at 12pm ...) d. A  prescription for pain medication should be given to you  upon discharge.  Take your pain medication as prescribed if your pain is not adequatly controlled with the over-the-counter pain reliefs mentioned above.  4. Avoid getting constipated.  Between the surgery and the pain medications, it is common to experience some constipation.  Increasing fluid intake and taking a fiber supplement (such as Metamucil, Citrucel, FiberCon, MiraLax, etc) 1-2 times a day regularly will usually help prevent this problem from occurring.  A mild laxative (prune juice, Milk of Magnesia, MiraLax, etc) should be taken according to package directions if there are no bowel movements after 48 hours.    5. Dressing: Your incisions are covered in Dermabond which is like sterile superglue for the skin. This will come off on it's own in a couple weeks. It is waterproof and you may bathe normally starting the day after your surgery in a shower. Avoid baths/pools/lakes/oceans until your wounds have fully healed.  6. ACTIVITIES as tolerated:   a. Avoid heavy lifting (>10lbs or 1 gallon of milk) for the next 6 weeks. b. You may resume regular daily activities as tolerated--such as daily self-care, walking, climbing stairs--gradually increasing activities as tolerated.  If you can walk 30 minutes without difficulty, it is safe to try more intense activity such as jogging, treadmill, bicycling, low-impact aerobics.  c. DO NOT PUSH THROUGH PAIN.  Let pain be your guide: If it hurts to do something, don't do it. d. You may drive when you are no longer taking prescription pain medication, you   can comfortably wear a seatbelt, and you can safely maneuver your car and apply brakes.  7. FOLLOW UP in our office a. Please call [facility] at [phone] to set up an appointment to see your surgeon in the office for a follow-up appointment approximately 2 weeks after your surgery. b. Make sure that you call for this appointment the day you arrive home to insure a convenient appointment time.  9. If you  have disability or family leave forms that need to be completed, you may have them completed by your primary care physician's office; for return to work instructions, please ask our office staff and they will be happy to assist you in obtaining this documentation   When to call us [phone]: 1. Poor pain control 2. Reactions / problems with new medications (rash/itching, etc)  3. Fever over 101.5 F (38.5 C) 4. Inability to urinate 5. Nausea/vomiting 6. Worsening swelling or bruising 7. Continued bleeding from incision. 8. Increased pain, redness, or drainage from the incision  The clinic staff is available to answer your questions during regular business hours (8:30am-5pm).  Please don't hesitate to call and ask to speak to one of our nurses for clinical concerns.   A surgeon from [facility] is always on call at the hospitals   If you have a medical emergency, go to the nearest emergency room or call 911.  [facility] [address], [address], [place], [place]  [address] MAIN: [phone] FAX: [phone] [email or web address] 

## 2019-05-13 LAB — SURGICAL PATHOLOGY

## 2019-05-14 ENCOUNTER — Telehealth: Payer: Self-pay | Admitting: Gastroenterology

## 2019-05-14 NOTE — Telephone Encounter (Signed)
Patty, We discussed his case at GI cancer conference this morning. He needs OV with me in May 2021 to see how he is recovering from his surgery and also to plan repeat colonoscopy.  Thanks  [place] and [place], [place]

## 2019-05-14 NOTE — Telephone Encounter (Signed)
Recall office visit for May 2021 in [place]

## 2019-05-16 ENCOUNTER — Encounter (HOSPITAL_COMMUNITY): Payer: Self-pay | Admitting: Oncology

## 2019-05-26 ENCOUNTER — Encounter: Payer: Self-pay | Admitting: Nurse Practitioner

## 2019-05-26 ENCOUNTER — Inpatient Hospital Stay: Payer: Self-pay | Attending: Oncology | Admitting: Nurse Practitioner

## 2019-05-26 ENCOUNTER — Inpatient Hospital Stay: Payer: Self-pay

## 2019-05-26 ENCOUNTER — Other Ambulatory Visit: Payer: Self-pay

## 2019-05-26 ENCOUNTER — Telehealth: Payer: Self-pay | Admitting: Pharmacist

## 2019-05-26 VITALS — BP 126/52 | HR 78 | Temp 98.0°F | Resp 16 | Ht 67.0 in | Wt 160.6 lb

## 2019-05-26 DIAGNOSIS — Z9049 Acquired absence of other specified parts of digestive tract: Secondary | ICD-10-CM | POA: Insufficient documentation

## 2019-05-26 DIAGNOSIS — C187 Malignant neoplasm of sigmoid colon: Secondary | ICD-10-CM

## 2019-05-26 DIAGNOSIS — D509 Iron deficiency anemia, unspecified: Secondary | ICD-10-CM | POA: Insufficient documentation

## 2019-05-26 LAB — CBC WITH DIFFERENTIAL (CANCER CENTER ONLY)
Abs Immature Granulocytes: 0.01 10*3/uL (ref 0.00–0.07)
Basophils Absolute: 0 10*3/uL (ref 0.0–0.1)
Basophils Relative: 1 %
Eosinophils Absolute: 0.5 10*3/uL (ref 0.0–0.5)
Eosinophils Relative: 9 %
HCT: 37.5 % — ABNORMAL LOW (ref 39.0–52.0)
Hemoglobin: 11.5 g/dL — ABNORMAL LOW (ref 13.0–17.0)
Immature Granulocytes: 0 %
Lymphocytes Relative: 26 %
Lymphs Abs: 1.6 10*3/uL (ref 0.7–4.0)
MCH: 27.4 pg (ref 26.0–34.0)
MCHC: 30.7 g/dL (ref 30.0–36.0)
MCV: 89.3 fL (ref 80.0–100.0)
Monocytes Absolute: 0.7 10*3/uL (ref 0.1–1.0)
Monocytes Relative: 12 %
Neutro Abs: 3.2 10*3/uL (ref 1.7–7.7)
Neutrophils Relative %: 52 %
Platelet Count: 330 10*3/uL (ref 150–400)
RBC: 4.2 MIL/uL — ABNORMAL LOW (ref 4.22–5.81)
RDW: 14.7 % (ref 11.5–15.5)
WBC Count: 6.1 10*3/uL (ref 4.0–10.5)
nRBC: 0 % (ref 0.0–0.2)

## 2019-05-26 LAB — CMP (CANCER CENTER ONLY)
ALT: 16 U/L (ref 0–44)
AST: 15 U/L (ref 15–41)
Albumin: 3.6 g/dL (ref 3.5–5.0)
Alkaline Phosphatase: 58 U/L (ref 38–126)
Anion gap: 7 (ref 5–15)
BUN: 14 mg/dL (ref 8–23)
CO2: 28 mmol/L (ref 22–32)
Calcium: 8.9 mg/dL (ref 8.9–10.3)
Chloride: 107 mmol/L (ref 98–111)
Creatinine: 0.83 mg/dL (ref 0.61–1.24)
GFR, Est AFR Am: >60 mL/min (ref >60)
GFR, Estimated: >60 mL/min (ref >60)
Glucose, Bld: 92 mg/dL (ref 70–99)
Potassium: 4.3 mmol/L (ref 3.5–5.1)
Sodium: 142 mmol/L (ref 135–145)
Total Bilirubin: <0.2 mg/dL — ABNORMAL LOW (ref 0.3–1.2)
Total Protein: 7 g/dL (ref 6.5–8.1)

## 2019-05-26 LAB — CEA (IN HOUSE-CHCC): CEA (CHCC-In House): 2.22 ng/mL (ref 0.00–5.00)

## 2019-05-26 MED ORDER — CAPECITABINE 500 MG PO TABS: ORAL_TABLET | ORAL | 0 refills | Status: DC

## 2019-05-26 NOTE — Telephone Encounter (Signed)
Oral Oncology Pharmacist Encounter  Received new prescription for Xeloda (capecitabine) for the adjuvant treatment of sigmoid colon cancer, planned duration no relevant lab abnormalities.  CMP from 05/26/19 assessed, no relevant lab abnormalities. Prescription dose and frequency assessed.   Current medication list in Epic reviewed, one DDIs with capecitabine identified: - Omeprazole: Proton Pump Inhibitors (PPI) may diminish the therapeutic effect of capecitabine. Patient has been on omeprazole since his surgery. Recommended switching to a H2 antagonist (eg, famotidine) to avoid this DDI. If famotidine is not enough to prevent his reflux he was instructed to let the office know  Prescription has been e-scribed to the [facility] for benefits analysis and approval.  Patient education Counseled patient and his son on administration, dosing, side effects, monitoring, drug-food interactions, safe handling, storage, and disposal. Patient will take 4 tablets by mouth in AM and 3 tablets in PM. Take with food. Take for 14 days, then hold for 7 days. Repeat every 21 days.  Side effects include but not limited to: diarrhea, N/V, hand-foot syndrome, decreased wbc, fatigue.    Reviewed with patient importance of keeping a medication schedule and plan for any missed doses.  [name] and his son voiced understanding and appreciation. All questions answered. Medication handout provided.  Oral Oncology Clinic will continue to follow for insurance authorization, copayment issues, and start date.  Provided patient with [facility] phone number. Patient knows to call the office with questions or concerns. Oral Chemotherapy Navigation Clinic will continue to follow.  Darl Pikes, PharmD, BCPS, [facility] Hematology/Oncology Clinical Pharmacist [facility] [phone]  05/26/2019 2:50 PM

## 2019-05-26 NOTE — Progress Notes (Addendum)
[place] OFFICE PROGRESS NOTE   Diagnosis: Colon cancer, iron deficiency anemia  INTERVAL HISTORY:   [name] returns as scheduled.  He is accompanied by his son who serves as the interpreter.  Overall he is feeling well.  Bowels moving regularly.  He estimates 3 bowel movements a day.  He has a good appetite.  He denies pain.  He has had a recent rash on his extremities and abdomen.  The rash is pruritic.  Objective:  Vital signs in last 24 hours:  Blood pressure (!) 126/52, pulse 78, temperature 98 F (36.7 C), temperature source Temporal, resp. rate 16, height 5' 7"  (1.702 m), weight 160 lb 9.6 oz (72.8 kg), SpO2 100 %.    HEENT: No thrush or ulcers. GI: Healed midline surgical incision. Vascular: No leg edema.  Skin: Palms without erythema.  Erythematous rash at the lower legs, confluent in some areas; rash also present but less so, at the forearms and abdomen.  No rash on the back.   Lab Results:  Lab Results  Component Value Date   WBC 7.9 05/09/2019   HGB 9.0 (L) 05/09/2019   HCT 30.2 (L) 05/09/2019   MCV 91.2 05/09/2019   PLT 204 05/09/2019   NEUTROABS 4.4 05/02/2019    Imaging:  No results found.  Medications: I have reviewed the patient's current medications.  Assessment/Plan: 1. Sigmoid colon cancer, T2N0, stage I, status post a left hemicolectomy on 12/06/2018; cecum/proximal ascending colon cancer stage IIIB (T3N1b), transverse colon cancer stage IIB (T4aN0) status post extended right hemicolectomy 05/07/2019 ? Tumor invasive superficial portion of the muscle ("internal third "), no tumor perforation, no vascular invasion or perineural invasion, negative resection margins, 0/4 lymph nodes ? Elevated CEA 02/10/2019 ? CTs 03/05/2019, compared to outside CTs from 11/22/2018-worsened wall thickening in the cecum stable apple core lesion in the mid transverse colon, no evidence of recurrent tumor at the distal colonic anastomosis, no  evidence of metastatic disease ? Colonoscopy 03/24/2019 20-2 malignant appearing masses noted in the colon, 1 filled the cecum measuring approximately 4 cm across and friable.  The other malignant appearing mass was circumferential creating a stricture with a 1.2 cm lumen located in the transverse segment approximately 4 to 5 cm in length.  There were 12-18 neoplastic appearing polyps in between the 2 obvious malignant masses ranging in size from 3 mm to 2 cm.  5 polyps distal to the transverse colon mass.  2 sigmoid colon polyps.  Cecum mass positive for adenocarcinoma.  Transverse colon mass positive for adenocarcinoma. ? 05/07/2019 laparoscopic extended right hemicolectomy, lysis of adhesions-5 cm invasive moderately differentiated adenocarcinoma involving cecum and proximal ascending colon, carcinoma invades into the pericolonic soft tissue; 7 cm invasive moderately differentiated carcinoma involving the transverse colon, carcinoma invades into the serosal surface (visceral peritoneum); all resection margins negative for carcinoma.  Lymphovascular invasion present.  In the proximal colon metastatic carcinoma involves 3 of 17 lymph nodes with 1 tumor deposit.  In the transverse colon 12 lymph nodes negative for metastatic carcinoma.  3 separate polyps: Tubular adenoma, inflammatory polyp and hyperplastic polyp 2. Microcytic anemia secondary to #1.  On oral iron.  Improved.  3. Family history of pancreas and colon cancer, negative genetic testingper the Invitaepanel, ATM variant of unknown significance   Disposition: [name] recently underwent an extended right hemicolectomy for synchronous primary colon cancers, stage III cecum/proximal ascending colon and stage II transverse colon.  Dr. Benay Spice reviewed the diagnosis, prognosis and  treatment options with him and his son at today's visit.  Based on the positive lymph nodes they understand there is a significant chance for recurrence over  the next several years.  They further understand the chance of recurrence can be reduced with adjuvant chemotherapy.  Dr. Benay Spice discussed Xeloda and FOLFOX.  The final recommendation is for a 26-monthcourse of Xeloda.  We reviewed potential toxicities associated with Xeloda including bone marrow toxicity, hair loss, nausea, diarrhea, hand-foot syndrome, skin rash, skin hyperpigmentation, increased sensitivity to sun.  He agrees to proceed.  The CHonalopharmacist is meeting with him today.  We anticipate he will begin cycle 1 adjuvant Xeloda on 06/02/2019.  We will see him in follow-up the week of 06/16/2019.  Patient seen with Dr. SBenay Spice  LNed CardANP/GNP-BC   05/26/2019  11:53 AM This was a shared visit with LNed Card  Mr. John Oilerhas recovered from the partial colectomy procedure.  His son was present for today's visit and served as an iAstronomer  Mr. John Oilerhas been diagnosed with synchronous stage II and stage III colon cancer, in addition to the stage I sigmoid colon cancer diagnosed in August 2020.  We reviewed details of the surgical pathology report and discussed the prognosis with [name]  He has a significant chance of developing recurrent colon cancer over the next several years.  We discussed the benefit associated with adjuvant 5-fluorouracil-based therapy in this setting.  I recommend adjuvant capecitabine.  We also discussed the small absolute benefit associated with the addition of oxaliplatin.  However data for the use of oxaliplatin in the adjuvant setting is limited to younger patients.  We reviewed potential toxicities associated with capecitabine.  He agrees to proceed.  Mr. John Oilerdoes not appear to have hereditary nonpolyposis colon cancer syndrome, but with the multiple synchronous primaries and polyps he should continue frequent surveillance.  BJulieanne Manson MD

## 2019-05-27 ENCOUNTER — Other Ambulatory Visit: Payer: Self-pay | Admitting: *Deleted

## 2019-05-27 MED ORDER — CAPECITABINE 500 MG PO TABS: ORAL_TABLET | ORAL | 0 refills | Status: DC

## 2019-05-27 NOTE — Telephone Encounter (Signed)
Oral Chemotherapy Pharmacist Encounter   Patient's son plans on picking up the Xeloda at [place] tomorrow 05/28/19. Copay $28.45.  They know to get started on Monday 06/02/19.  Darl Pikes, PharmD, BCPS, [facility] Hematology/Oncology Clinical Pharmacist [facility] [place] [facility] [phone]  05/27/2019 5:55 PM

## 2019-05-27 NOTE — Progress Notes (Signed)
Xeloda order printed in error. Clarified script and e-scribed to [place].

## 2019-05-28 ENCOUNTER — Telehealth: Payer: Self-pay | Admitting: Oncology

## 2019-05-28 MED FILL — CAPECITABINE 500 MG TABLET: 500 | 21 days supply | Qty: 98 | Fill #0

## 2019-05-28 NOTE — Telephone Encounter (Signed)
Scheduled per los. Called and spoke with patient. Confirmed appt 

## 2019-06-09 ENCOUNTER — Other Ambulatory Visit: Payer: Self-pay

## 2019-06-13 ENCOUNTER — Other Ambulatory Visit: Payer: Self-pay | Admitting: *Deleted

## 2019-06-13 ENCOUNTER — Other Ambulatory Visit: Payer: Self-pay

## 2019-06-13 ENCOUNTER — Ambulatory Visit: Payer: Self-pay | Admitting: Oncology

## 2019-06-13 DIAGNOSIS — C187 Malignant neoplasm of sigmoid colon: Secondary | ICD-10-CM

## 2019-06-16 ENCOUNTER — Other Ambulatory Visit: Payer: Self-pay

## 2019-06-16 ENCOUNTER — Other Ambulatory Visit: Payer: Self-pay | Admitting: *Deleted

## 2019-06-16 ENCOUNTER — Inpatient Hospital Stay: Payer: Self-pay

## 2019-06-16 ENCOUNTER — Inpatient Hospital Stay (HOSPITAL_BASED_OUTPATIENT_CLINIC_OR_DEPARTMENT_OTHER): Payer: Self-pay | Admitting: Oncology

## 2019-06-16 ENCOUNTER — Other Ambulatory Visit: Payer: Self-pay | Admitting: Oncology

## 2019-06-16 ENCOUNTER — Telehealth: Payer: Self-pay | Admitting: Oncology

## 2019-06-16 VITALS — BP 138/62 | HR 69 | Temp 98.3°F | Resp 17 | Ht 67.0 in | Wt 166.1 lb

## 2019-06-16 DIAGNOSIS — C187 Malignant neoplasm of sigmoid colon: Secondary | ICD-10-CM

## 2019-06-16 LAB — CMP (CANCER CENTER ONLY)
ALT: 11 U/L (ref 0–44)
AST: 16 U/L (ref 15–41)
Albumin: 3.5 g/dL (ref 3.5–5.0)
Alkaline Phosphatase: 67 U/L (ref 38–126)
Anion gap: 7 (ref 5–15)
BUN: 17 mg/dL (ref 8–23)
CO2: 25 mmol/L (ref 22–32)
Calcium: 8.9 mg/dL (ref 8.9–10.3)
Chloride: 110 mmol/L (ref 98–111)
Creatinine: 0.89 mg/dL (ref 0.61–1.24)
GFR, Est AFR Am: >60 mL/min (ref >60)
GFR, Estimated: >60 mL/min (ref >60)
Glucose, Bld: 81 mg/dL (ref 70–99)
Potassium: 4.3 mmol/L (ref 3.5–5.1)
Sodium: 142 mmol/L (ref 135–145)
Total Bilirubin: 0.2 mg/dL — ABNORMAL LOW (ref 0.3–1.2)
Total Protein: 6.5 g/dL (ref 6.5–8.1)

## 2019-06-16 LAB — CBC WITH DIFFERENTIAL (CANCER CENTER ONLY)
Abs Immature Granulocytes: 0.01 10*3/uL (ref 0.00–0.07)
Basophils Absolute: 0 10*3/uL (ref 0.0–0.1)
Basophils Relative: 1 %
Eosinophils Absolute: 0.3 10*3/uL (ref 0.0–0.5)
Eosinophils Relative: 4 %
HCT: 39.9 % (ref 39.0–52.0)
Hemoglobin: 12.6 g/dL — ABNORMAL LOW (ref 13.0–17.0)
Immature Granulocytes: 0 %
Lymphocytes Relative: 25 %
Lymphs Abs: 1.5 10*3/uL (ref 0.7–4.0)
MCH: 28.2 pg (ref 26.0–34.0)
MCHC: 31.6 g/dL (ref 30.0–36.0)
MCV: 89.3 fL (ref 80.0–100.0)
Monocytes Absolute: 0.7 10*3/uL (ref 0.1–1.0)
Monocytes Relative: 12 %
Neutro Abs: 3.5 10*3/uL (ref 1.7–7.7)
Neutrophils Relative %: 58 %
Platelet Count: 219 10*3/uL (ref 150–400)
RBC: 4.47 MIL/uL (ref 4.22–5.81)
RDW: 15.5 % (ref 11.5–15.5)
WBC Count: 6 10*3/uL (ref 4.0–10.5)
nRBC: 0 % (ref 0.0–0.2)

## 2019-06-16 MED ORDER — CAPECITABINE 500 MG PO TABS: ORAL_TABLET | ORAL | 0 refills | Status: DC

## 2019-06-16 NOTE — Telephone Encounter (Signed)
Scheduled appt per 2/22 los - pt son aware of appts. My chart active - per pt no print out needed.

## 2019-06-16 NOTE — Progress Notes (Signed)
John Vance   Diagnosis: Colon cancer INTERVAL HISTORY:   [name] began adjuvant Xeloda on 06/02/2019.  No mouth sores or hand/foot pain.  He feels well.  He is eating well and walking.  He has 3-4 bowel movements per day.  This has been the pattern since he had his initial colon surgery.  Objective:  Vital signs in last 24 hours:  Blood pressure 138/62, pulse 69, temperature 98.3 F (36.8 C), temperature source Temporal, resp. rate 17, height 5' 7"  (1.702 m), weight 166 lb 1.6 oz (75.3 kg), SpO2 100 %.    HEENT: No thrush or ulcers GI: Soft, no hepatomegaly, healed midline scar Vascular: No leg edema  Skin: Palms without erythema   Lab Results:  Lab Results  Component Value Date   WBC 6.0 06/16/2019   HGB 12.6 (L) 06/16/2019   HCT 39.9 06/16/2019   MCV 89.3 06/16/2019   PLT 219 06/16/2019   NEUTROABS 3.5 06/16/2019    CMP  Lab Results  Component Value Date   NA 142 06/16/2019   K 4.3 06/16/2019   CL 110 06/16/2019   CO2 25 06/16/2019   GLUCOSE 81 06/16/2019   BUN 17 06/16/2019   CREATININE 0.89 06/16/2019   CALCIUM 8.9 06/16/2019   PROT 6.5 06/16/2019   ALBUMIN 3.5 06/16/2019   AST 16 06/16/2019   ALT 11 06/16/2019   ALKPHOS 67 06/16/2019   BILITOT 0.2 (L) 06/16/2019   GFRNONAA >60 06/16/2019   GFRAA >60 06/16/2019    Lab Results  Component Value Date   CEA1 2.22 05/26/2019     Medications: I have reviewed the patient's current medications.   Assessment/Plan: 1. Sigmoid colon cancer, T2N0, stage I, status post a left hemicolectomy on 12/06/2018; cecum/proximal ascending colon cancer stage IIIB (T3N1b), transverse colon cancer stage IIB (T4aN0) status post extended right hemicolectomy 05/07/2019 ? Tumor invasive superficial portion of the muscle ("internal third "), no tumor perforation, no vascular invasion or perineural invasion, negative resection margins, 0/4 lymph nodes ? Elevated CEA 02/10/2019 ? CTs  03/05/2019, compared to outside CTs from 11/22/2018-worsened wall thickening in the cecum stable apple core lesion in the mid transverse colon, no evidence of recurrent tumor at the distal colonic anastomosis, no evidence of metastatic disease ? Colonoscopy 03/24/2019 20-2 malignant appearing masses noted in the colon, 1 filled the cecum measuring approximately 4 cm across and friable.  The other malignant appearing mass was circumferential creating a stricture with a 1.2 cm lumen located in the transverse segment approximately 4 to 5 cm in length.  There were 12-18 neoplastic appearing polyps in between the 2 obvious malignant masses ranging in size from 3 mm to 2 cm.  5 polyps distal to the transverse colon mass.  2 sigmoid colon polyps.  Cecum mass positive for adenocarcinoma.  Transverse colon mass positive for adenocarcinoma. ? 05/07/2019 laparoscopic extended right hemicolectomy, lysis of adhesions-5 cm invasive moderately differentiated adenocarcinoma involving cecum and proximal ascending colon, carcinoma invades into the pericolonic soft tissue; 7 cm invasive moderately differentiated carcinoma involving the transverse colon, carcinoma invades into the serosal surface (visceral peritoneum); all resection margins negative for carcinoma.  Lymphovascular invasion present.  In the proximal colon metastatic carcinoma involves 3 of 17 lymph nodes with 1 tumor deposit.  In the transverse colon 12 lymph nodes negative for metastatic carcinoma.  3 separate polyps: Tubular adenoma, inflammatory polyp and hyperplastic polyp ? Cycle 1 capecitabine 06/02/2019 ? Cycle to capecitabine 06/23/2019 2.  Microcytic anemia secondary to #1.    Resolved 3. Family history of pancreas and colon cancer, negative genetic testingper the Invitaepanel, ATM variant of unknown significance     Disposition: [name] appears well.  He tolerated the first cycle of Xeloda well.  He will complete cycle 2 beginning 06/23/2019.  He  will return for an office and lab visit during the week of 07/07/2019.  He will discontinue iron therapy.  [name] will register for the COVID-19 vaccine.  John Coder, MD  06/16/2019  9:23 AM

## 2019-06-20 ENCOUNTER — Ambulatory Visit: Payer: Self-pay | Attending: Internal Medicine

## 2019-06-20 DIAGNOSIS — Z23 Encounter for immunization: Secondary | ICD-10-CM | POA: Insufficient documentation

## 2019-06-20 MED FILL — CAPECITABINE 500 MG TABLET: 500 | 21 days supply | Qty: 98 | Fill #0

## 2019-06-20 NOTE — Progress Notes (Signed)
   Covid-19 Vaccination Clinic  Name:  [facility] John Vance    MRN: [id number] DOB: Apr 03, 1941  06/20/2019  [name] was observed post Covid-19 immunization for 15 minutes without incidence. He was provided with Vaccine Information Sheet and instruction to access the V-Safe system.   [name] was instructed to call 911 with any severe reactions post vaccine: Marland Kitchen Difficulty breathing  . Swelling of your face and throat  . A fast heartbeat  . A bad rash all over your body  . Dizziness and weakness    Immunizations Administered    Name Date Dose VIS Date Route   Pfizer COVID-19 Vaccine 06/20/2019 11:18 AM 0.3 mL 04/04/2019 Intramuscular   Manufacturer: [place]   Lot: [id number]   [place]: [id number]

## 2019-07-09 ENCOUNTER — Inpatient Hospital Stay: Payer: Self-pay

## 2019-07-09 ENCOUNTER — Other Ambulatory Visit: Payer: Self-pay

## 2019-07-09 ENCOUNTER — Telehealth: Payer: Self-pay | Admitting: Oncology

## 2019-07-09 ENCOUNTER — Telehealth: Payer: Self-pay

## 2019-07-09 ENCOUNTER — Inpatient Hospital Stay: Payer: Self-pay | Attending: Oncology | Admitting: Oncology

## 2019-07-09 VITALS — BP 145/61 | HR 87 | Temp 97.8°F | Resp 20 | Ht 67.0 in | Wt 168.9 lb

## 2019-07-09 DIAGNOSIS — C187 Malignant neoplasm of sigmoid colon: Secondary | ICD-10-CM

## 2019-07-09 DIAGNOSIS — Z800 Family history of malignant neoplasm of digestive organs: Secondary | ICD-10-CM | POA: Insufficient documentation

## 2019-07-09 DIAGNOSIS — R21 Rash and other nonspecific skin eruption: Secondary | ICD-10-CM | POA: Insufficient documentation

## 2019-07-09 DIAGNOSIS — Z9049 Acquired absence of other specified parts of digestive tract: Secondary | ICD-10-CM | POA: Insufficient documentation

## 2019-07-09 LAB — CBC WITH DIFFERENTIAL (CANCER CENTER ONLY)
Abs Immature Granulocytes: 0.02 10*3/uL (ref 0.00–0.07)
Basophils Absolute: 0 10*3/uL (ref 0.0–0.1)
Basophils Relative: 1 %
Eosinophils Absolute: 0.2 10*3/uL (ref 0.0–0.5)
Eosinophils Relative: 4 %
HCT: 40.8 % (ref 39.0–52.0)
Hemoglobin: 13.1 g/dL (ref 13.0–17.0)
Immature Granulocytes: 0 %
Lymphocytes Relative: 27 %
Lymphs Abs: 1.6 10*3/uL (ref 0.7–4.0)
MCH: 28.4 pg (ref 26.0–34.0)
MCHC: 32.1 g/dL (ref 30.0–36.0)
MCV: 88.3 fL (ref 80.0–100.0)
Monocytes Absolute: 0.8 10*3/uL (ref 0.1–1.0)
Monocytes Relative: 13 %
Neutro Abs: 3.3 10*3/uL (ref 1.7–7.7)
Neutrophils Relative %: 55 %
Platelet Count: 193 10*3/uL (ref 150–400)
RBC: 4.62 MIL/uL (ref 4.22–5.81)
RDW: 17.2 % — ABNORMAL HIGH (ref 11.5–15.5)
WBC Count: 6 10*3/uL (ref 4.0–10.5)
nRBC: 0 % (ref 0.0–0.2)

## 2019-07-09 LAB — CMP (CANCER CENTER ONLY)
ALT: 10 U/L (ref 0–44)
AST: 16 U/L (ref 15–41)
Albumin: 3.7 g/dL (ref 3.5–5.0)
Alkaline Phosphatase: 76 U/L (ref 38–126)
Anion gap: 9 (ref 5–15)
BUN: 17 mg/dL (ref 8–23)
CO2: 25 mmol/L (ref 22–32)
Calcium: 9 mg/dL (ref 8.9–10.3)
Chloride: 107 mmol/L (ref 98–111)
Creatinine: 0.94 mg/dL (ref 0.61–1.24)
GFR, Est AFR Am: >60 mL/min (ref >60)
GFR, Estimated: >60 mL/min (ref >60)
Glucose, Bld: 102 mg/dL — ABNORMAL HIGH (ref 70–99)
Potassium: 4.3 mmol/L (ref 3.5–5.1)
Sodium: 141 mmol/L (ref 135–145)
Total Bilirubin: 0.4 mg/dL (ref 0.3–1.2)
Total Protein: 6.9 g/dL (ref 6.5–8.1)

## 2019-07-09 MED ORDER — CAPECITABINE 500 MG PO TABS: ORAL_TABLET | ORAL | 0 refills | Status: DC

## 2019-07-09 MED FILL — CAPECITABINE 500 MG TABLET: 500 | 21 days supply | Qty: 98 | Fill #0

## 2019-07-09 NOTE — Telephone Encounter (Signed)
Scheduled appt per 3/17 los. Gave pt a print out of AVS.

## 2019-07-09 NOTE — Telephone Encounter (Signed)
Spoke with pharmacy regarding Xeloda refill

## 2019-07-09 NOTE — Progress Notes (Signed)
[place] OFFICE PROGRESS NOTE   Diagnosis: Colon cancer  INTERVAL HISTORY:   John Vance completed a second cycle of Xeloda beginning 06/23/2019.  No nausea or diarrhea.  No hand/foot pain.  He has noted a mild rash at the forearms and right hip area.  Mild mouth soreness.  Good appetite.  He is exercising.  Objective:  Vital signs in last 24 hours:  Blood pressure (!) 145/61, pulse 87, temperature 97.8 F (36.6 C), temperature source Temporal, resp. rate 20, height 5' 7"  (1.702 m), weight 168 lb 14.4 oz (76.6 kg), SpO2 100 %.    HEENT: No thrush or ulcers GI: Nontender, no hepatomegaly Vascular: No leg edema  Skin: Mild hyperpigmented drier rash at the right upper trochanter region, hyperpigmented moles over the forearms   Lab Results:  Lab Results  Component Value Date   WBC 6.0 07/09/2019   HGB 13.1 07/09/2019   HCT 40.8 07/09/2019   MCV 88.3 07/09/2019   PLT 193 07/09/2019   NEUTROABS 3.3 07/09/2019    CMP  Lab Results  Component Value Date   NA 142 06/16/2019   K 4.3 06/16/2019   CL 110 06/16/2019   CO2 25 06/16/2019   GLUCOSE 81 06/16/2019   BUN 17 06/16/2019   CREATININE 0.89 06/16/2019   CALCIUM 8.9 06/16/2019   PROT 6.5 06/16/2019   ALBUMIN 3.5 06/16/2019   AST 16 06/16/2019   ALT 11 06/16/2019   ALKPHOS 67 06/16/2019   BILITOT 0.2 (L) 06/16/2019   GFRNONAA >60 06/16/2019   GFRAA >60 06/16/2019    Lab Results  Component Value Date   CEA1 2.22 05/26/2019     Medications: I have reviewed the patient's current medications.   Assessment/Plan:  1. Sigmoid colon cancer, T2N0, stage I, status post a left hemicolectomy on 12/06/2018; cecum/proximal ascending colon cancer stage IIIB (T3N1b), transverse colon cancer stage IIB (T4aN0) status post extended right hemicolectomy 05/07/2019 ? Tumor invasive superficial portion of the muscle ("internal third "), no tumor perforation, no vascular invasion or perineural invasion, negative  resection margins, 0/4 lymph nodes ? Elevated CEA 02/10/2019 ? CTs 03/05/2019, compared to outside CTs from 11/22/2018-worsened wall thickening in the cecum stable apple core lesion in the mid transverse colon, no evidence of recurrent tumor at the distal colonic anastomosis, no evidence of metastatic disease ? Colonoscopy 03/24/2019 20-2 malignant appearing masses noted in the colon, 1 filled the cecum measuring approximately 4 cm across and friable.  The other malignant appearing mass was circumferential creating a stricture with a 1.2 cm lumen located in the transverse segment approximately 4 to 5 cm in length.  There were 12-18 neoplastic appearing polyps in between the 2 obvious malignant masses ranging in size from 3 mm to 2 cm.  5 polyps distal to the transverse colon mass.  2 sigmoid colon polyps.  Cecum mass positive for adenocarcinoma.  Transverse colon mass positive for adenocarcinoma. ? 05/07/2019 laparoscopic extended right hemicolectomy, lysis of adhesions-5 cm invasive moderately differentiated adenocarcinoma involving cecum and proximal ascending colon, carcinoma invades into the pericolonic soft tissue; 7 cm invasive moderately differentiated carcinoma involving the transverse colon, carcinoma invades into the serosal surface (visceral peritoneum); all resection margins negative for carcinoma.  Lymphovascular invasion present.  In the proximal colon metastatic carcinoma involves 3 of 17 lymph nodes with 1 tumor deposit.  In the transverse colon 12 lymph nodes negative for metastatic carcinoma.  3 separate polyps: Tubular adenoma, inflammatory polyp and hyperplastic polyp ? Cycle 1 capecitabine  06/02/2019 ? Cycle 2 capecitabine 06/23/2019 ? Cycle 3 capecitabine 07/14/2019 2.   Microcytic anemia secondary to #1.    Resolved 3. Family history of pancreas and colon cancer, negative genetic testingper the Invitaepanel, ATM variant of unknown significance      Disposition: John Vance appears  well.  He has tolerated the capecitabine well.  He will begin cycle 3 on 07/14/2019.  The mild skin changes are likely related to capecitabine.  He will call if these worsen and for new symptoms.  He will return for an office visit on 08/01/2019.  Betsy Coder, MD  07/09/2019  9:35 AM

## 2019-07-15 ENCOUNTER — Ambulatory Visit: Payer: Self-pay | Attending: Internal Medicine

## 2019-07-15 DIAGNOSIS — Z23 Encounter for immunization: Secondary | ICD-10-CM

## 2019-07-15 NOTE — Progress Notes (Signed)
   Covid-19 Vaccination Clinic  Name:  [facility] Pearson Vance    MRN: [id number] DOB: 1940/09/16  07/15/2019  John Vance was observed post Covid-19 immunization for 15 minutes without incident. He was provided with Vaccine Information Sheet and instruction to access the V-Safe system.   John Vance was instructed to call 911 with any severe reactions post vaccine: Marland Kitchen Difficulty breathing  . Swelling of face and throat  . A fast heartbeat  . A bad rash all over body  . Dizziness and weakness   Immunizations Administered    Name Date Dose VIS Date Route   Pfizer COVID-19 Vaccine 07/15/2019  1:44 PM 0.3 mL 04/04/2019 Intramuscular   Manufacturer: [place]   Lot: [id number]   [place]: [id number]

## 2019-08-01 ENCOUNTER — Inpatient Hospital Stay: Payer: Self-pay | Attending: Oncology | Admitting: Nurse Practitioner

## 2019-08-01 ENCOUNTER — Other Ambulatory Visit: Payer: Self-pay

## 2019-08-01 ENCOUNTER — Telehealth: Payer: Self-pay | Admitting: Nurse Practitioner

## 2019-08-01 ENCOUNTER — Encounter: Payer: Self-pay | Admitting: Nurse Practitioner

## 2019-08-01 ENCOUNTER — Inpatient Hospital Stay: Payer: Self-pay

## 2019-08-01 VITALS — BP 128/51 | HR 78 | Temp 98.5°F | Resp 18 | Ht 67.0 in | Wt 171.1 lb

## 2019-08-01 DIAGNOSIS — Z9049 Acquired absence of other specified parts of digestive tract: Secondary | ICD-10-CM | POA: Insufficient documentation

## 2019-08-01 DIAGNOSIS — D509 Iron deficiency anemia, unspecified: Secondary | ICD-10-CM | POA: Insufficient documentation

## 2019-08-01 DIAGNOSIS — C180 Malignant neoplasm of cecum: Secondary | ICD-10-CM | POA: Insufficient documentation

## 2019-08-01 DIAGNOSIS — C187 Malignant neoplasm of sigmoid colon: Secondary | ICD-10-CM

## 2019-08-01 DIAGNOSIS — C182 Malignant neoplasm of ascending colon: Secondary | ICD-10-CM | POA: Insufficient documentation

## 2019-08-01 DIAGNOSIS — Z800 Family history of malignant neoplasm of digestive organs: Secondary | ICD-10-CM | POA: Insufficient documentation

## 2019-08-01 DIAGNOSIS — Z9221 Personal history of antineoplastic chemotherapy: Secondary | ICD-10-CM | POA: Insufficient documentation

## 2019-08-01 LAB — CBC WITH DIFFERENTIAL (CANCER CENTER ONLY)
Abs Immature Granulocytes: 0.03 10*3/uL (ref 0.00–0.07)
Basophils Absolute: 0 10*3/uL (ref 0.0–0.1)
Basophils Relative: 1 %
Eosinophils Absolute: 0.2 10*3/uL (ref 0.0–0.5)
Eosinophils Relative: 3 %
HCT: 37.2 % — ABNORMAL LOW (ref 39.0–52.0)
Hemoglobin: 12.3 g/dL — ABNORMAL LOW (ref 13.0–17.0)
Immature Granulocytes: 1 %
Lymphocytes Relative: 22 %
Lymphs Abs: 1.4 10*3/uL (ref 0.7–4.0)
MCH: 29.7 pg (ref 26.0–34.0)
MCHC: 33.1 g/dL (ref 30.0–36.0)
MCV: 89.9 fL (ref 80.0–100.0)
Monocytes Absolute: 1 10*3/uL (ref 0.1–1.0)
Monocytes Relative: 16 %
Neutro Abs: 3.7 10*3/uL (ref 1.7–7.7)
Neutrophils Relative %: 57 %
Platelet Count: 194 10*3/uL (ref 150–400)
RBC: 4.14 MIL/uL — ABNORMAL LOW (ref 4.22–5.81)
RDW: 20.2 % — ABNORMAL HIGH (ref 11.5–15.5)
WBC Count: 6.3 10*3/uL (ref 4.0–10.5)
nRBC: 0 % (ref 0.0–0.2)

## 2019-08-01 LAB — CMP (CANCER CENTER ONLY)
ALT: 11 U/L (ref 0–44)
AST: 14 U/L — ABNORMAL LOW (ref 15–41)
Albumin: 3.5 g/dL (ref 3.5–5.0)
Alkaline Phosphatase: 81 U/L (ref 38–126)
Anion gap: 6 (ref 5–15)
BUN: 20 mg/dL (ref 8–23)
CO2: 25 mmol/L (ref 22–32)
Calcium: 9 mg/dL (ref 8.9–10.3)
Chloride: 110 mmol/L (ref 98–111)
Creatinine: 0.99 mg/dL (ref 0.61–1.24)
GFR, Est AFR Am: >60 mL/min (ref >60)
GFR, Estimated: >60 mL/min (ref >60)
Glucose, Bld: 93 mg/dL (ref 70–99)
Potassium: 4.1 mmol/L (ref 3.5–5.1)
Sodium: 141 mmol/L (ref 135–145)
Total Bilirubin: 0.3 mg/dL (ref 0.3–1.2)
Total Protein: 6.5 g/dL (ref 6.5–8.1)

## 2019-08-01 MED ORDER — CAPECITABINE 500 MG PO TABS: ORAL_TABLET | ORAL | 0 refills | Status: DC

## 2019-08-01 MED FILL — CAPECITABINE 500 MG TABLET: 500 | 21 days supply | Qty: 98 | Fill #0

## 2019-08-01 NOTE — Progress Notes (Signed)
[place] OFFICE PROGRESS NOTE   Diagnosis: Colon cancer  INTERVAL HISTORY:   John Vance returns as scheduled.  He completed cycle 3 Xeloda beginning 07/14/2019.  He denies nausea/vomiting.  No mouth sores.  No diarrhea.  He reports a "pimple" on the left foot, painful to walk for a few days.  Discomfort is better now.  No hand pain.  Rash on the forearms is stable.  Objective:  Vital signs in last 24 hours:  Blood pressure (!) 128/51, pulse 78, temperature 98.5 F (36.9 C), temperature source Temporal, resp. rate 18, height 5' 7"  (1.702 m), weight 171 lb 1.6 oz (77.6 kg), SpO2 100 %.    HEENT: No thrush or ulcers. GI: Abdomen soft and nontender.  No hepatomegaly. Vascular: No leg edema. Skin: Mild erythematous rash over the forearms.  Upper aspect plantar surface left foot with approximate 1 cm healing blister appearing lesion.  Left second toe with a hyperpigmented skin lesion.  Palms and soles are mildly erythematous.  Palms with skin thickening as well.   Lab Results:  Lab Results  Component Value Date   WBC 6.3 08/01/2019   HGB 12.3 (L) 08/01/2019   HCT 37.2 (L) 08/01/2019   MCV 89.9 08/01/2019   PLT 194 08/01/2019   NEUTROABS 3.7 08/01/2019    Imaging:  No results found.  Medications: I have reviewed the patient's current medications.  Assessment/Plan: 1. Sigmoid colon cancer, T2N0, stage I, status post a left hemicolectomy on 12/06/2018; cecum/proximal ascending colon cancer stage IIIB (T3N1b), transverse colon cancer stage IIB (T4aN0) status post extended right hemicolectomy 05/07/2019 ? Tumor invasive superficial portion of the muscle ("internal third "), no tumor perforation, no vascular invasion or perineural invasion, negative resection margins, 0/4 lymph nodes ? Elevated CEA 02/10/2019 ? CTs 03/05/2019, compared to outside CTs from 11/22/2018-worsened wall thickening in the cecum stable apple core lesion in the mid transverse colon, no evidence  of recurrent tumor at the distal colonic anastomosis, no evidence of metastatic disease ? Colonoscopy 03/24/2019 20-2 malignant appearing masses noted in the colon, 1 filledthe cecum measuring approximately 4 cm across and friable. The other malignant appearing mass was circumferential creating a stricture with a 1.2 cm lumen located in the transverse segment approximately 4 to 5 cm in length. There were 12-18 neoplastic appearing polyps in between the 2 obvious malignant masses ranging in size from 3 mm to 2 cm. 5 polyps distal to the transverse colon mass. 2 sigmoid colon polyps.  Cecum mass positive for adenocarcinoma.  Transverse colon mass positive for adenocarcinoma. ? 05/07/2019 laparoscopic extended right hemicolectomy, lysis of adhesions-5 cm invasive moderately differentiated adenocarcinoma involving cecum and proximal ascending colon, carcinoma invades into the pericolonic soft tissue; 7 cm invasive moderately differentiated carcinoma involving the transverse colon, carcinoma invades into the serosal surface (visceral peritoneum); all resection margins negative for carcinoma.  Lymphovascular invasion present.  In the proximal colon metastatic carcinoma involves 3 of 17 lymph nodes with 1 tumor deposit.  In the transverse colon 12 lymph nodes negative for metastatic carcinoma.  3 separate polyps: Tubular adenoma, inflammatory polyp and hyperplastic polyp ? Cycle 1 capecitabine 06/02/2019 ? Cycle 2 capecitabine 06/23/2019 ? Cycle 3 capecitabine 07/14/2019 ? Cycle 4 capecitabine 08/04/2019 2.   Microcytic anemia secondary to #1.  Resolved 3. Family history of pancreas and colon cancer, negative genetic testingper the Invitaepanel, ATM variant of unknown significance    Disposition: John Vance appears well.  He has completed 3 cycles of capecitabine.  Plan  to proceed with cycle 4 as scheduled beginning 08/04/2019.  He has early hand-foot syndrome.  He understands to hold Xeloda and contact  the office if his symptoms worsen.  We reviewed the CBC from today.  Counts adequate to proceed with Xeloda as above.  He will return for lab and follow-up in approximately 3 weeks.  He will contact the office in the interim with any problems.  Patient seen with Dr. Benay Spice.    Ned Card ANP/GNP-BC   08/01/2019  9:09 AM This was a shared visit with Ned Card.  John Vance was interviewed and examined.  He has mild hand-foot syndrome.  He will begin another cycle of Xeloda at the current dose.  He knows to contact us for worsening of the foot blister or new symptoms.  Julieanne Manson, MD

## 2019-08-01 NOTE — Telephone Encounter (Signed)
Scheduled appt per 4/9 los.  Printed calendar and avs. 

## 2019-08-19 ENCOUNTER — Other Ambulatory Visit: Payer: Self-pay

## 2019-08-19 ENCOUNTER — Inpatient Hospital Stay: Payer: Self-pay

## 2019-08-19 ENCOUNTER — Inpatient Hospital Stay (HOSPITAL_BASED_OUTPATIENT_CLINIC_OR_DEPARTMENT_OTHER): Payer: Self-pay | Admitting: Oncology

## 2019-08-19 ENCOUNTER — Telehealth: Payer: Self-pay | Admitting: Oncology

## 2019-08-19 VITALS — BP 152/55 | HR 80 | Temp 98.3°F | Resp 20 | Ht 67.0 in | Wt 171.8 lb

## 2019-08-19 DIAGNOSIS — C187 Malignant neoplasm of sigmoid colon: Secondary | ICD-10-CM

## 2019-08-19 LAB — CMP (CANCER CENTER ONLY)
ALT: 12 U/L (ref 0–44)
AST: 18 U/L (ref 15–41)
Albumin: 3.6 g/dL (ref 3.5–5.0)
Alkaline Phosphatase: 77 U/L (ref 38–126)
Anion gap: 9 (ref 5–15)
BUN: 14 mg/dL (ref 8–23)
CO2: 26 mmol/L (ref 22–32)
Calcium: 9.2 mg/dL (ref 8.9–10.3)
Chloride: 107 mmol/L (ref 98–111)
Creatinine: 1 mg/dL (ref 0.61–1.24)
GFR, Est AFR Am: >60 mL/min (ref >60)
GFR, Estimated: >60 mL/min (ref >60)
Glucose, Bld: 118 mg/dL — ABNORMAL HIGH (ref 70–99)
Potassium: 3.9 mmol/L (ref 3.5–5.1)
Sodium: 142 mmol/L (ref 135–145)
Total Bilirubin: 0.6 mg/dL (ref 0.3–1.2)
Total Protein: 6.8 g/dL (ref 6.5–8.1)

## 2019-08-19 LAB — CBC WITH DIFFERENTIAL (CANCER CENTER ONLY)
Abs Immature Granulocytes: 0.03 10*3/uL (ref 0.00–0.07)
Basophils Absolute: 0 10*3/uL (ref 0.0–0.1)
Basophils Relative: 1 %
Eosinophils Absolute: 0.2 10*3/uL (ref 0.0–0.5)
Eosinophils Relative: 3 %
HCT: 38.3 % — ABNORMAL LOW (ref 39.0–52.0)
Hemoglobin: 13 g/dL (ref 13.0–17.0)
Immature Granulocytes: 0 %
Lymphocytes Relative: 23 %
Lymphs Abs: 1.7 10*3/uL (ref 0.7–4.0)
MCH: 31.4 pg (ref 26.0–34.0)
MCHC: 33.9 g/dL (ref 30.0–36.0)
MCV: 92.5 fL (ref 80.0–100.0)
Monocytes Absolute: 0.9 10*3/uL (ref 0.1–1.0)
Monocytes Relative: 12 %
Neutro Abs: 4.5 10*3/uL (ref 1.7–7.7)
Neutrophils Relative %: 61 %
Platelet Count: 188 10*3/uL (ref 150–400)
RBC: 4.14 MIL/uL — ABNORMAL LOW (ref 4.22–5.81)
RDW: 21.2 % — ABNORMAL HIGH (ref 11.5–15.5)
WBC Count: 7.3 10*3/uL (ref 4.0–10.5)
nRBC: 0 % (ref 0.0–0.2)

## 2019-08-19 MED ORDER — CAPECITABINE 500 MG PO TABS: ORAL_TABLET | ORAL | 0 refills | Status: DC

## 2019-08-19 MED FILL — CAPECITABINE 500 MG TABLET: 500 | 21 days supply | Qty: 98 | Fill #0

## 2019-08-19 NOTE — Progress Notes (Signed)
[place] OFFICE PROGRESS NOTE   Diagnosis: Colon cancer  INTERVAL HISTORY:   [name] returns as scheduled.  He began another cycle of capecitabine on 08/04/2019.  He has darkening of the hands and feet.  No pain.  No diarrhea.  He is exercising.  Good appetite.  Objective:  Vital signs in last 24 hours:  Blood pressure (!) 152/55, pulse 80, temperature 98.3 F (36.8 C), temperature source Temporal, resp. rate 20, height 5' 7"  (1.702 m), weight 171 lb 12.8 oz (77.9 kg), SpO2 100 %.    HEENT: No thrush or ulcers Cardio: Regular rate and rhythm GI: No hepatosplenomegaly, nontender, no mass Vascular: No leg edema  Skin: Mild hyperpigmentation of the hands with mild erythema at the fingertips-no skin breakdown.  Mild hyperpigmentation of the feet, healing "blister "at the proximal plantar surface of the left foot, healing lesion between the toes at the left foot   Lab Results:  Lab Results  Component Value Date   WBC 7.3 08/19/2019   HGB 13.0 08/19/2019   HCT 38.3 (L) 08/19/2019   MCV 92.5 08/19/2019   PLT 188 08/19/2019   NEUTROABS 4.5 08/19/2019    CMP  Lab Results  Component Value Date   NA 142 08/19/2019   K 3.9 08/19/2019   CL 107 08/19/2019   CO2 26 08/19/2019   GLUCOSE 118 (H) 08/19/2019   BUN 14 08/19/2019   CREATININE 1.00 08/19/2019   CALCIUM 9.2 08/19/2019   PROT 6.8 08/19/2019   ALBUMIN 3.6 08/19/2019   AST 18 08/19/2019   ALT 12 08/19/2019   ALKPHOS 77 08/19/2019   BILITOT 0.6 08/19/2019   GFRNONAA >60 08/19/2019   GFRAA >60 08/19/2019    Lab Results  Component Value Date   CEA1 2.22 05/26/2019    Medications: I have reviewed the patient's current medications.   Assessment/Plan: 1. Sigmoid colon cancer, T2N0, stage I, status post a left hemicolectomy on 12/06/2018; cecum/proximal ascending colon cancer stage IIIB (T3N1b), transverse colon cancer stage IIB (T4aN0) status post extended right hemicolectomy 05/07/2019 ? Tumor  invasive superficial portion of the muscle ("internal third "), no tumor perforation, no vascular invasion or perineural invasion, negative resection margins, 0/4 lymph nodes ? Elevated CEA 02/10/2019 ? CTs 03/05/2019, compared to outside CTs from 11/22/2018-worsened wall thickening in the cecum stable apple core lesion in the mid transverse colon, no evidence of recurrent tumor at the distal colonic anastomosis, no evidence of metastatic disease ? Colonoscopy 03/24/2019 20-2 malignant appearing masses noted in the colon, 1 filledthe cecum measuring approximately 4 cm across and friable. The other malignant appearing mass was circumferential creating a stricture with a 1.2 cm lumen located in the transverse segment approximately 4 to 5 cm in length. There were 12-18 neoplastic appearing polyps in between the 2 obvious malignant masses ranging in size from 3 mm to 2 cm. 5 polyps distal to the transverse colon mass. 2 sigmoid colon polyps.  Cecum mass positive for adenocarcinoma.  Transverse colon mass positive for adenocarcinoma. ? 05/07/2019 laparoscopic extended right hemicolectomy, lysis of adhesions-5 cm invasive moderately differentiated adenocarcinoma involving cecum and proximal ascending colon, carcinoma invades into the pericolonic soft tissue; 7 cm invasive moderately differentiated carcinoma involving the transverse colon, carcinoma invades into the serosal surface (visceral peritoneum); all resection margins negative for carcinoma.  Lymphovascular invasion present.  In the proximal colon metastatic carcinoma involves 3 of 17 lymph nodes with 1 tumor deposit.  In the transverse colon 12 lymph nodes negative for  metastatic carcinoma.  3 separate polyps: Tubular adenoma, inflammatory polyp and hyperplastic polyp ? Cycle 1 capecitabine 06/02/2019 ? Cycle 2 capecitabine 06/23/2019 ? Cycle 3 capecitabine 07/14/2019 ? Cycle 4 capecitabine 08/04/2019 ? Cycle 5 capecitabine 08/25/2019 2.   Microcytic anemia  secondary to #1.  Resolved 3. Family history of pancreas and colon cancer, negative genetic testingper the Invitaepanel, ATM variant of unknown significance     Disposition: [name] appears stable.  He has mild hand/foot syndrome.  He will complete another cycle of capecitabine beginning 08/25/2019.  He will contact us for new symptoms.  [name] will return for an office visit in 3 weeks.  Betsy Coder, MD  08/19/2019  10:18 AM

## 2019-08-19 NOTE — Telephone Encounter (Signed)
Scheduled per los. Patient declined printout  

## 2019-09-12 ENCOUNTER — Other Ambulatory Visit: Payer: Self-pay

## 2019-09-12 ENCOUNTER — Inpatient Hospital Stay: Payer: Self-pay | Attending: Oncology | Admitting: Nurse Practitioner

## 2019-09-12 ENCOUNTER — Inpatient Hospital Stay: Payer: Self-pay

## 2019-09-12 ENCOUNTER — Telehealth: Payer: Self-pay

## 2019-09-12 ENCOUNTER — Encounter: Payer: Self-pay | Admitting: Nurse Practitioner

## 2019-09-12 VITALS — BP 143/52 | HR 78 | Temp 97.3°F | Resp 17 | Ht 67.0 in | Wt 170.8 lb

## 2019-09-12 DIAGNOSIS — Z800 Family history of malignant neoplasm of digestive organs: Secondary | ICD-10-CM | POA: Insufficient documentation

## 2019-09-12 DIAGNOSIS — Z9049 Acquired absence of other specified parts of digestive tract: Secondary | ICD-10-CM | POA: Insufficient documentation

## 2019-09-12 DIAGNOSIS — C187 Malignant neoplasm of sigmoid colon: Secondary | ICD-10-CM

## 2019-09-12 DIAGNOSIS — C182 Malignant neoplasm of ascending colon: Secondary | ICD-10-CM | POA: Insufficient documentation

## 2019-09-12 LAB — CMP (CANCER CENTER ONLY)
ALT: 12 U/L (ref 0–44)
AST: 16 U/L (ref 15–41)
Albumin: 3.7 g/dL (ref 3.5–5.0)
Alkaline Phosphatase: 86 U/L (ref 38–126)
Anion gap: 5 (ref 5–15)
BUN: 16 mg/dL (ref 8–23)
CO2: 28 mmol/L (ref 22–32)
Calcium: 9.1 mg/dL (ref 8.9–10.3)
Chloride: 108 mmol/L (ref 98–111)
Creatinine: 1.04 mg/dL (ref 0.61–1.24)
GFR, Est AFR Am: >60 mL/min (ref >60)
GFR, Estimated: >60 mL/min (ref >60)
Glucose, Bld: 88 mg/dL (ref 70–99)
Potassium: 4.3 mmol/L (ref 3.5–5.1)
Sodium: 141 mmol/L (ref 135–145)
Total Bilirubin: 0.6 mg/dL (ref 0.3–1.2)
Total Protein: 6.8 g/dL (ref 6.5–8.1)

## 2019-09-12 LAB — CBC WITH DIFFERENTIAL (CANCER CENTER ONLY)
Abs Immature Granulocytes: 0.03 10*3/uL (ref 0.00–0.07)
Basophils Absolute: 0 10*3/uL (ref 0.0–0.1)
Basophils Relative: 1 %
Eosinophils Absolute: 0.2 10*3/uL (ref 0.0–0.5)
Eosinophils Relative: 3 %
HCT: 37.6 % — ABNORMAL LOW (ref 39.0–52.0)
Hemoglobin: 12.9 g/dL — ABNORMAL LOW (ref 13.0–17.0)
Immature Granulocytes: 0 %
Lymphocytes Relative: 20 %
Lymphs Abs: 1.4 10*3/uL (ref 0.7–4.0)
MCH: 33.7 pg (ref 26.0–34.0)
MCHC: 34.3 g/dL (ref 30.0–36.0)
MCV: 98.2 fL (ref 80.0–100.0)
Monocytes Absolute: 0.9 10*3/uL (ref 0.1–1.0)
Monocytes Relative: 14 %
Neutro Abs: 4.3 10*3/uL (ref 1.7–7.7)
Neutrophils Relative %: 62 %
Platelet Count: 173 10*3/uL (ref 150–400)
RBC: 3.83 MIL/uL — ABNORMAL LOW (ref 4.22–5.81)
RDW: 20.2 % — ABNORMAL HIGH (ref 11.5–15.5)
WBC Count: 6.8 10*3/uL (ref 4.0–10.5)
nRBC: 0 % (ref 0.0–0.2)

## 2019-09-12 MED ORDER — CAPECITABINE 500 MG PO TABS: ORAL_TABLET | ORAL | 0 refills | Status: DC

## 2019-09-12 MED FILL — CAPECITABINE 500 MG TABLET: 500 | 21 days supply | Qty: 98 | Fill #0

## 2019-09-12 NOTE — Progress Notes (Signed)
[place] OFFICE PROGRESS NOTE   Diagnosis: Colon cancer  INTERVAL HISTORY:   John Vance returns as scheduled.  He completed cycle 5 of capecitabine beginning 08/25/2019.  He denies nausea/vomiting.  No mouth sores.  No diarrhea.  No hand or foot pain or redness.  He notes hyperpigmentation over the palms and soles.  He has a stable rash over the forearms.  Objective:  Vital signs in last 24 hours:  Blood pressure (!) 143/52, pulse 78, temperature (!) 97.3 F (36.3 C), temperature source Temporal, resp. rate 17, height 5' 7"  (1.702 m), weight 170 lb 12.8 oz (77.5 kg), SpO2 100 %.    HEENT: No thrush or ulcers. Resp: Lungs clear bilaterally. Cardio: Regular rate and rhythm. GI: Abdomen soft and nontender.  No hepatomegaly. Vascular: No leg edema. Skin: Palms with hyperpigmentation.  No skin breakdown.   Lab Results:  Lab Results  Component Value Date   WBC 6.8 09/12/2019   HGB 12.9 (L) 09/12/2019   HCT 37.6 (L) 09/12/2019   MCV 98.2 09/12/2019   PLT 173 09/12/2019   NEUTROABS 4.3 09/12/2019    Imaging:  No results found.  Medications: I have reviewed the patient's current medications.  Assessment/Plan: 1. Sigmoid colon cancer, T2N0, stage I, status post a left hemicolectomy on 12/06/2018; cecum/proximal ascending colon cancer stage IIIB (T3N1b), transverse colon cancer stage IIB (T4aN0) status post extended right hemicolectomy 05/07/2019 ? Tumor invasive superficial portion of the muscle ("internal third "), no tumor perforation, no vascular invasion or perineural invasion, negative resection margins, 0/4 lymph nodes ? Elevated CEA 02/10/2019 ? CTs 03/05/2019, compared to outside CTs from 11/22/2018-worsened wall thickening in the cecum stable apple core lesion in the mid transverse colon, no evidence of recurrent tumor at the distal colonic anastomosis, no evidence of metastatic disease ? Colonoscopy 03/24/2019 20-2 malignant appearing masses noted  in the colon, 1 filledthe cecum measuring approximately 4 cm across and friable. The other malignant appearing mass was circumferential creating a stricture with a 1.2 cm lumen located in the transverse segment approximately 4 to 5 cm in length. There were 12-18 neoplastic appearing polyps in between the 2 obvious malignant masses ranging in size from 3 mm to 2 cm. 5 polyps distal to the transverse colon mass. 2 sigmoid colon polyps. Cecum mass positive for adenocarcinoma. Transverse colon mass positive for adenocarcinoma. ? 05/07/2019 laparoscopic extended right hemicolectomy, lysis of adhesions-5 cm invasive moderately differentiated adenocarcinoma involving cecum and proximal ascending colon, carcinoma invades into the pericolonic soft tissue; 7 cm invasive moderately differentiated carcinoma involving the transverse colon, carcinoma invades into the serosal surface (visceral peritoneum); all resection margins negative for carcinoma. Lymphovascular invasion present. In the proximal colon metastatic carcinoma involves 3 of 17 lymph nodes with 1 tumor deposit. In the transverse colon 12 lymph nodes negative for metastatic carcinoma. 3 separate polyps: Tubular adenoma, inflammatory polyp and hyperplastic polyp ? Cycle 1 capecitabine 06/02/2019 ? Cycle2capecitabine 06/23/2019 ? Cycle 3 capecitabine 07/14/2019 ? Cycle 4 capecitabine 08/04/2019 ? Cycle 5 capecitabine 08/25/2019 ? Cycle 6 capecitabine 09/15/2019 2. Microcytic anemia secondary to #1.Resolved 3. Family history of pancreas and colon cancer, negative genetic testingper the Invitaepanel, ATM variant of unknown significance  Disposition: John Vance appears stable.  He has completed 5 cycles of capecitabine.  He continues to tolerate well.  Plan to proceed with cycle 6 as scheduled beginning 09/15/2019.  We reviewed the CBC from today.  Counts adequate to proceed with Xeloda as above.  He will  return for lab and follow-up in  approximately 3 weeks.  He will contact the office in the interim with any problems.    Ned Card ANP/GNP-BC   09/12/2019  9:18 AM

## 2019-09-12 NOTE — Telephone Encounter (Signed)
Per Ned Card NP refilled patients Xeloda and sent it to [facility] outpatient pharmacy. [facility] [phone]) and verified that patient would be able to come pick up refill right now and they stated yes. Patient made aware and headed there now.

## 2019-09-30 ENCOUNTER — Other Ambulatory Visit: Payer: Self-pay | Admitting: Nurse Practitioner

## 2019-09-30 ENCOUNTER — Other Ambulatory Visit: Payer: Self-pay | Admitting: *Deleted

## 2019-09-30 ENCOUNTER — Telehealth: Payer: Self-pay | Admitting: Oncology

## 2019-09-30 ENCOUNTER — Inpatient Hospital Stay: Payer: Self-pay | Attending: Oncology

## 2019-09-30 ENCOUNTER — Inpatient Hospital Stay (HOSPITAL_BASED_OUTPATIENT_CLINIC_OR_DEPARTMENT_OTHER): Payer: Self-pay | Admitting: Oncology

## 2019-09-30 ENCOUNTER — Other Ambulatory Visit: Payer: Self-pay

## 2019-09-30 ENCOUNTER — Encounter: Payer: Self-pay | Admitting: Nurse Practitioner

## 2019-09-30 VITALS — BP 134/51 | HR 76 | Temp 97.7°F | Resp 18 | Ht 67.0 in | Wt 170.3 lb

## 2019-09-30 DIAGNOSIS — C187 Malignant neoplasm of sigmoid colon: Secondary | ICD-10-CM

## 2019-09-30 DIAGNOSIS — C188 Malignant neoplasm of overlapping sites of colon: Secondary | ICD-10-CM | POA: Insufficient documentation

## 2019-09-30 DIAGNOSIS — Z800 Family history of malignant neoplasm of digestive organs: Secondary | ICD-10-CM | POA: Insufficient documentation

## 2019-09-30 LAB — CBC WITH DIFFERENTIAL (CANCER CENTER ONLY)
Abs Immature Granulocytes: 0.02 10*3/uL (ref 0.00–0.07)
Basophils Absolute: 0 10*3/uL (ref 0.0–0.1)
Basophils Relative: 1 %
Eosinophils Absolute: 0.2 10*3/uL (ref 0.0–0.5)
Eosinophils Relative: 3 %
HCT: 38.2 % — ABNORMAL LOW (ref 39.0–52.0)
Hemoglobin: 13 g/dL (ref 13.0–17.0)
Immature Granulocytes: 0 %
Lymphocytes Relative: 27 %
Lymphs Abs: 1.5 10*3/uL (ref 0.7–4.0)
MCH: 34.5 pg — ABNORMAL HIGH (ref 26.0–34.0)
MCHC: 34 g/dL (ref 30.0–36.0)
MCV: 101.3 fL — ABNORMAL HIGH (ref 80.0–100.0)
Monocytes Absolute: 0.8 10*3/uL (ref 0.1–1.0)
Monocytes Relative: 15 %
Neutro Abs: 3.1 10*3/uL (ref 1.7–7.7)
Neutrophils Relative %: 54 %
Platelet Count: 169 10*3/uL (ref 150–400)
RBC: 3.77 MIL/uL — ABNORMAL LOW (ref 4.22–5.81)
RDW: 17.4 % — ABNORMAL HIGH (ref 11.5–15.5)
WBC Count: 5.6 10*3/uL (ref 4.0–10.5)
nRBC: 0 % (ref 0.0–0.2)

## 2019-09-30 LAB — CMP (CANCER CENTER ONLY)
ALT: 15 U/L (ref 0–44)
AST: 20 U/L (ref 15–41)
Albumin: 3.7 g/dL (ref 3.5–5.0)
Alkaline Phosphatase: 81 U/L (ref 38–126)
Anion gap: 10 (ref 5–15)
BUN: 16 mg/dL (ref 8–23)
CO2: 24 mmol/L (ref 22–32)
Calcium: 9.3 mg/dL (ref 8.9–10.3)
Chloride: 107 mmol/L (ref 98–111)
Creatinine: 1.02 mg/dL (ref 0.61–1.24)
GFR, Est AFR Am: >60 mL/min (ref >60)
GFR, Estimated: >60 mL/min (ref >60)
Glucose, Bld: 114 mg/dL — ABNORMAL HIGH (ref 70–99)
Potassium: 4.1 mmol/L (ref 3.5–5.1)
Sodium: 141 mmol/L (ref 135–145)
Total Bilirubin: 1 mg/dL (ref 0.3–1.2)
Total Protein: 6.7 g/dL (ref 6.5–8.1)

## 2019-09-30 MED ORDER — CAPECITABINE 500 MG PO TABS: ORAL_TABLET | ORAL | 0 refills | Status: DC

## 2019-09-30 NOTE — Progress Notes (Signed)
[place] OFFICE PROGRESS NOTE   Diagnosis: Colon cancer  INTERVAL HISTORY:   [name] returns as scheduled.  He completed another cycle of Xeloda beginning 09/15/2019.  No mouth sores or diarrhea.  He continues to have erythema and hyperpigmentation of the palms and soles.  No pain.  He had an episode of abdominal discomfort after eating cheese and milk this week.  No consistent pain.  He is exercising.  He feels well.  Objective:  Vital signs in last 24 hours:  Blood pressure (!) 134/51, pulse 76, temperature 97.7 F (36.5 C), temperature source Temporal, resp. rate 18, height 5' 7" (1.702 m), weight 170 lb 4.8 oz (77.2 kg), SpO2 100 %.    HEENT: No thrush or ulcers Resp: Lungs clear bilaterally Cardio: Regular rate and rhythm GI: No hepatomegaly, nontender Vascular: No leg edema  Skin: Hyperpigmentation, dryness, and mild erythema of the palms and soles    Lab Results:  Lab Results  Component Value Date   WBC 5.6 09/30/2019   HGB 13.0 09/30/2019   HCT 38.2 (L) 09/30/2019   MCV 101.3 (H) 09/30/2019   PLT 169 09/30/2019   NEUTROABS 3.1 09/30/2019    CMP  Lab Results  Component Value Date   NA 141 09/12/2019   K 4.3 09/12/2019   CL 108 09/12/2019   CO2 28 09/12/2019   GLUCOSE 88 09/12/2019   BUN 16 09/12/2019   CREATININE 1.04 09/12/2019   CALCIUM 9.1 09/12/2019   PROT 6.8 09/12/2019   ALBUMIN 3.7 09/12/2019   AST 16 09/12/2019   ALT 12 09/12/2019   ALKPHOS 86 09/12/2019   BILITOT 0.6 09/12/2019   GFRNONAA >60 09/12/2019   GFRAA >60 09/12/2019    Lab Results  Component Value Date   CEA1 2.22 05/26/2019    Medications: I have reviewed the patient's current medications.   Assessment/Plan: 1. Sigmoid colon cancer, T2N0, stage I, status post a left hemicolectomy on 12/06/2018; cecum/proximal ascending colon cancer stage IIIB (T3N1b), transverse colon cancer stage IIB (T4aN0) status post extended right hemicolectomy  05/07/2019 ? Tumor invasive superficial portion of the muscle ("internal third "), no tumor perforation, no vascular invasion or perineural invasion, negative resection margins, 0/4 lymph nodes ? Elevated CEA 02/10/2019 ? CTs 03/05/2019, compared to outside CTs from 11/22/2018-worsened wall thickening in the cecum stable apple core lesion in the mid transverse colon, no evidence of recurrent tumor at the distal colonic anastomosis, no evidence of metastatic disease ? Colonoscopy 03/24/2019 20-2 malignant appearing masses noted in the colon, 1 filledthe cecum measuring approximately 4 cm across and friable. The other malignant appearing mass was circumferential creating a stricture with a 1.2 cm lumen located in the transverse segment approximately 4 to 5 cm in length. There were 12-18 neoplastic appearing polyps in between the 2 obvious malignant masses ranging in size from 3 mm to 2 cm. 5 polyps distal to the transverse colon mass. 2 sigmoid colon polyps. Cecum mass positive for adenocarcinoma. Transverse colon mass positive for adenocarcinoma. ? 05/07/2019 laparoscopic extended right hemicolectomy, lysis of adhesions-5 cm invasive moderately differentiated adenocarcinoma involving cecum and proximal ascending colon, carcinoma invades into the pericolonic soft tissue; 7 cm invasive moderately differentiated carcinoma involving the transverse colon, carcinoma invades into the serosal surface (visceral peritoneum); all resection margins negative for carcinoma. Lymphovascular invasion present. In the proximal colon metastatic carcinoma involves 3 of 17 lymph nodes with 1 tumor deposit. In the transverse colon 12 lymph nodes negative for metastatic carcinoma. 3  separate polyps: Tubular adenoma, inflammatory polyp and hyperplastic polyp ? Cycle 1 capecitabine 06/02/2019 ? Cycle2capecitabine 06/23/2019 ? Cycle 3 capecitabine 07/14/2019 ? Cycle 4 capecitabine 08/04/2019 ? Cycle 5 capecitabine  08/25/2019 ? Cycle 6 capecitabine 09/15/2019 ? Cycle 7 capecitabine 10/06/2019 2. Microcytic anemia secondary to #1.Resolved 3. Family history of pancreas and colon cancer, negative genetic testingper the Invitaepanel, ATM variant of unknown significance    Disposition: He appears stable.  He is tolerating the capecitabine well.  He will complete another cycle beginning 10/06/2019. [name] will call for hand/foot pain.  He will return for an office visit on 10/22/2019.  We will check the CEA when he is here on 10/22/2019.  Betsy Coder, MD  09/30/2019  9:10 AM

## 2019-09-30 NOTE — Telephone Encounter (Signed)
Scheduled appt per 6/8 los.  Pt declined calendar and avs.

## 2019-10-14 ENCOUNTER — Other Ambulatory Visit: Payer: Self-pay | Admitting: Oncology

## 2019-10-14 DIAGNOSIS — C187 Malignant neoplasm of sigmoid colon: Secondary | ICD-10-CM

## 2019-10-20 MED FILL — CAPECITABINE 500 MG TABLET: 500 | 21 days supply | Qty: 98 | Fill #0

## 2019-10-21 ENCOUNTER — Other Ambulatory Visit: Payer: Self-pay

## 2019-10-21 ENCOUNTER — Encounter: Payer: Self-pay | Admitting: Nurse Practitioner

## 2019-10-21 ENCOUNTER — Telehealth: Payer: Self-pay | Admitting: Oncology

## 2019-10-21 ENCOUNTER — Inpatient Hospital Stay (HOSPITAL_BASED_OUTPATIENT_CLINIC_OR_DEPARTMENT_OTHER): Payer: Self-pay | Admitting: Nurse Practitioner

## 2019-10-21 ENCOUNTER — Inpatient Hospital Stay: Payer: Self-pay

## 2019-10-21 VITALS — BP 121/51 | HR 71 | Temp 97.8°F | Resp 18 | Ht 67.0 in | Wt 169.5 lb

## 2019-10-21 DIAGNOSIS — C187 Malignant neoplasm of sigmoid colon: Secondary | ICD-10-CM

## 2019-10-21 LAB — CBC WITH DIFFERENTIAL (CANCER CENTER ONLY)
Abs Immature Granulocytes: 0.02 10*3/uL (ref 0.00–0.07)
Basophils Absolute: 0 10*3/uL (ref 0.0–0.1)
Basophils Relative: 1 %
Eosinophils Absolute: 0.2 10*3/uL (ref 0.0–0.5)
Eosinophils Relative: 3 %
HCT: 37.9 % — ABNORMAL LOW (ref 39.0–52.0)
Hemoglobin: 13.1 g/dL (ref 13.0–17.0)
Immature Granulocytes: 0 %
Lymphocytes Relative: 23 %
Lymphs Abs: 1.4 10*3/uL (ref 0.7–4.0)
MCH: 35.2 pg — ABNORMAL HIGH (ref 26.0–34.0)
MCHC: 34.6 g/dL (ref 30.0–36.0)
MCV: 101.9 fL — ABNORMAL HIGH (ref 80.0–100.0)
Monocytes Absolute: 0.9 10*3/uL (ref 0.1–1.0)
Monocytes Relative: 14 %
Neutro Abs: 3.5 10*3/uL (ref 1.7–7.7)
Neutrophils Relative %: 59 %
Platelet Count: 163 10*3/uL (ref 150–400)
RBC: 3.72 MIL/uL — ABNORMAL LOW (ref 4.22–5.81)
RDW: 15.9 % — ABNORMAL HIGH (ref 11.5–15.5)
WBC Count: 6 10*3/uL (ref 4.0–10.5)
nRBC: 0 % (ref 0.0–0.2)

## 2019-10-21 LAB — CMP (CANCER CENTER ONLY)
ALT: 17 U/L (ref 0–44)
AST: 18 U/L (ref 15–41)
Albumin: 3.7 g/dL (ref 3.5–5.0)
Alkaline Phosphatase: 73 U/L (ref 38–126)
Anion gap: 9 (ref 5–15)
BUN: 19 mg/dL (ref 8–23)
CO2: 22 mmol/L (ref 22–32)
Calcium: 9 mg/dL (ref 8.9–10.3)
Chloride: 108 mmol/L (ref 98–111)
Creatinine: 0.94 mg/dL (ref 0.61–1.24)
GFR, Est AFR Am: >60 mL/min (ref >60)
GFR, Estimated: >60 mL/min (ref >60)
Glucose, Bld: 114 mg/dL — ABNORMAL HIGH (ref 70–99)
Potassium: 4 mmol/L (ref 3.5–5.1)
Sodium: 139 mmol/L (ref 135–145)
Total Bilirubin: 1 mg/dL (ref 0.3–1.2)
Total Protein: 6.5 g/dL (ref 6.5–8.1)

## 2019-10-21 LAB — CEA (IN HOUSE-CHCC): CEA (CHCC-In House): 1.76 ng/mL (ref 0.00–5.00)

## 2019-10-21 NOTE — Progress Notes (Signed)
[place] OFFICE PROGRESS NOTE   Diagnosis: Colon cancer  INTERVAL HISTORY:   [name] returns as scheduled.  He completed cycle 7 capecitabine beginning 10/06/2019.  Denies nausea/vomiting.  No mouth sores.  No diarrhea.  After eating abdomen intermittently feels "heavy".  He has a good appetite.  Hands and feet continue to be dry.  He is applying lotion once a day.  He reports recent onset of pain left medial upper sole.  Son has recently noticed eye tearing.  Objective:  Vital signs in last 24 hours:  Blood pressure (!) 121/51, pulse 71, temperature 97.8 F (36.6 C), temperature source Temporal, resp. rate 18, height 5' 7"  (1.702 m), weight 169 lb 8 oz (76.9 kg), SpO2 99 %.    HEENT: No thrush or ulcers.  Right eye with mild conjunctival erythema. GI: Abdomen soft and nontender.  No hepatomegaly. Vascular: No leg edema. Skin: Palms and soles with hyperpigmentation, mild erythema, dryness.  Ulcer present left upper medial sole.   Lab Results:  Lab Results  Component Value Date   WBC 6.0 10/21/2019   HGB 13.1 10/21/2019   HCT 37.9 (L) 10/21/2019   MCV 101.9 (H) 10/21/2019   PLT 163 10/21/2019   NEUTROABS 3.5 10/21/2019    Imaging:  No results found.  Medications: I have reviewed the patient's current medications.  Assessment/Plan: 1. Sigmoid colon cancer, T2N0, stage I, status post a left hemicolectomy on 12/06/2018; cecum/proximal ascending colon cancer stage IIIB (T3N1b), transverse colon cancer stage IIB (T4aN0) status post extended right hemicolectomy 05/07/2019 ? Tumor invasive superficial portion of the muscle ("internal third "), no tumor perforation, no vascular invasion or perineural invasion, negative resection margins, 0/4 lymph nodes ? Elevated CEA 02/10/2019 ? CTs 03/05/2019, compared to outside CTs from 11/22/2018-worsened wall thickening in the cecum stable apple core lesion in the mid transverse colon, no evidence of recurrent tumor  at the distal colonic anastomosis, no evidence of metastatic disease ? Colonoscopy 03/24/2019 20-2 malignant appearing masses noted in the colon, 1 filledthe cecum measuring approximately 4 cm across and friable. The other malignant appearing mass was circumferential creating a stricture with a 1.2 cm lumen located in the transverse segment approximately 4 to 5 cm in length. There were 12-18 neoplastic appearing polyps in between the 2 obvious malignant masses ranging in size from 3 mm to 2 cm. 5 polyps distal to the transverse colon mass. 2 sigmoid colon polyps. Cecum mass positive for adenocarcinoma. Transverse colon mass positive for adenocarcinoma. ? 05/07/2019 laparoscopic extended right hemicolectomy, lysis of adhesions-5 cm invasive moderately differentiated adenocarcinoma involving cecum and proximal ascending colon, carcinoma invades into the pericolonic soft tissue; 7 cm invasive moderately differentiated carcinoma involving the transverse colon, carcinoma invades into the serosal surface (visceral peritoneum); all resection margins negative for carcinoma. Lymphovascular invasion present. In the proximal colon metastatic carcinoma involves 3 of 17 lymph nodes with 1 tumor deposit. In the transverse colon 12 lymph nodes negative for metastatic carcinoma. 3 separate polyps: Tubular adenoma, inflammatory polyp and hyperplastic polyp ? Cycle 1 capecitabine 06/02/2019 ? Cycle2capecitabine 06/23/2019 ? Cycle 3 capecitabine 07/14/2019 ? Cycle 4 capecitabine 08/04/2019 ? Cycle 5 capecitabine 08/25/2019 ? Cycle 6 capecitabine 09/15/2019 ? Cycle 7 capecitabine 10/06/2019 ? Cycle 8 capecitabine 10/27/2019 2. Microcytic anemia secondary to #1.Resolved 3. Family history of pancreas and colon cancer, negative genetic testingper the Invitaepanel, ATM variant of unknown significance    Disposition: [name] appears stable.  He has completed 7 cycles of capecitabine.  Plan to proceed  with cycle 8 as scheduled beginning 10/27/2019.    He has mild hand-foot syndrome.  He will contact the office if symptoms worsen.  We reviewed the CBC from today.  Counts adequate to proceed as above.  He will return for lab and follow-up in 4 to 6 weeks.  He will contact the office in the interim as outlined above or with any other problems.  Plan reviewed with Dr. Benay Spice.    Ned Card ANP/GNP-BC   10/21/2019  9:29 AM

## 2019-10-21 NOTE — Telephone Encounter (Signed)
Scheduled per 06/29 los, patient will be reminded per My chart.

## 2019-10-29 ENCOUNTER — Ambulatory Visit: Payer: Self-pay | Admitting: Gastroenterology

## 2019-11-28 ENCOUNTER — Other Ambulatory Visit: Payer: Self-pay | Admitting: *Deleted

## 2019-11-28 DIAGNOSIS — C187 Malignant neoplasm of sigmoid colon: Secondary | ICD-10-CM

## 2019-12-01 ENCOUNTER — Inpatient Hospital Stay: Payer: Self-pay | Attending: Oncology

## 2019-12-01 ENCOUNTER — Inpatient Hospital Stay (HOSPITAL_BASED_OUTPATIENT_CLINIC_OR_DEPARTMENT_OTHER): Payer: Self-pay | Admitting: Oncology

## 2019-12-01 ENCOUNTER — Telehealth: Payer: Self-pay | Admitting: Oncology

## 2019-12-01 ENCOUNTER — Other Ambulatory Visit: Payer: Self-pay

## 2019-12-01 DIAGNOSIS — C187 Malignant neoplasm of sigmoid colon: Secondary | ICD-10-CM

## 2019-12-01 DIAGNOSIS — Z85038 Personal history of other malignant neoplasm of large intestine: Secondary | ICD-10-CM | POA: Insufficient documentation

## 2019-12-01 DIAGNOSIS — Z9049 Acquired absence of other specified parts of digestive tract: Secondary | ICD-10-CM | POA: Insufficient documentation

## 2019-12-01 DIAGNOSIS — Z9221 Personal history of antineoplastic chemotherapy: Secondary | ICD-10-CM | POA: Insufficient documentation

## 2019-12-01 LAB — CBC WITH DIFFERENTIAL (CANCER CENTER ONLY)
Abs Immature Granulocytes: 0.03 10*3/uL (ref 0.00–0.07)
Basophils Absolute: 0.1 10*3/uL (ref 0.0–0.1)
Basophils Relative: 1 %
Eosinophils Absolute: 0.2 10*3/uL (ref 0.0–0.5)
Eosinophils Relative: 3 %
HCT: 41.9 % (ref 39.0–52.0)
Hemoglobin: 14.2 g/dL (ref 13.0–17.0)
Immature Granulocytes: 1 %
Lymphocytes Relative: 27 %
Lymphs Abs: 1.5 10*3/uL (ref 0.7–4.0)
MCH: 34 pg (ref 26.0–34.0)
MCHC: 33.9 g/dL (ref 30.0–36.0)
MCV: 100.2 fL — ABNORMAL HIGH (ref 80.0–100.0)
Monocytes Absolute: 0.8 10*3/uL (ref 0.1–1.0)
Monocytes Relative: 14 %
Neutro Abs: 3.1 10*3/uL (ref 1.7–7.7)
Neutrophils Relative %: 54 %
Platelet Count: 138 10*3/uL — ABNORMAL LOW (ref 150–400)
RBC: 4.18 MIL/uL — ABNORMAL LOW (ref 4.22–5.81)
RDW: 13.8 % (ref 11.5–15.5)
WBC Count: 5.6 10*3/uL (ref 4.0–10.5)
nRBC: 0 % (ref 0.0–0.2)

## 2019-12-01 LAB — CMP (CANCER CENTER ONLY)
ALT: 16 U/L (ref 0–44)
AST: 20 U/L (ref 15–41)
Albumin: 3.6 g/dL (ref 3.5–5.0)
Alkaline Phosphatase: 91 U/L (ref 38–126)
Anion gap: 7 (ref 5–15)
BUN: 17 mg/dL (ref 8–23)
CO2: 27 mmol/L (ref 22–32)
Calcium: 9.8 mg/dL (ref 8.9–10.3)
Chloride: 107 mmol/L (ref 98–111)
Creatinine: 1.09 mg/dL (ref 0.61–1.24)
GFR, Est AFR Am: >60 mL/min (ref >60)
GFR, Estimated: >60 mL/min (ref >60)
Glucose, Bld: 121 mg/dL — ABNORMAL HIGH (ref 70–99)
Potassium: 4.1 mmol/L (ref 3.5–5.1)
Sodium: 141 mmol/L (ref 135–145)
Total Bilirubin: 0.8 mg/dL (ref 0.3–1.2)
Total Protein: 6.8 g/dL (ref 6.5–8.1)

## 2019-12-01 NOTE — Progress Notes (Signed)
[facility] OFFICE VISIT PROGRESS NOTE  I connected with@ on 12/01/19 at 11:00 AM EDT by video and verified that I am speaking with the correct person using two identifiers.   I discussed the limitations, risks, security and privacy concerns of performing an evaluation and management service by telemedicine and the availability of in-person appointments. I also discussed with the patient that there may be a patient responsible charge related to this service. The patient expressed understanding and agreed to proceed.  Other persons participating in the visit and their role in the encounter: Son  Patient's location: Home Provider's location: Hom  Diagnosis: Colon cancer  INTERVAL HISTORY:  [name] completed a final cycle of adjuvant capecitabine beginning 10/27/2019.  No mouth sores, nausea, diarrhea, or hand/foot pain.  He feels well.  He has noticed darkening of the skin over the hands and face.  The hand darkening has improved.  He is also lost fingerprint markings.  Labs: CEA on 10/21/2019: 1.76  Medications: I have reviewed the patient's current medications.  Assessment/Plan: 1. Sigmoid colon cancer, T2N0, stage I, status post a left hemicolectomy on 12/06/2018; cecum/proximal ascending colon cancer stage IIIB (T3N1b), transverse colon cancer stage IIB (T4aN0) status post extended right hemicolectomy 05/07/2019 ? Tumor invasive superficial portion of the muscle ("internal third "), no tumor perforation, no vascular invasion or perineural invasion, negative resection margins, 0/4 lymph nodes ? Elevated CEA 02/10/2019 ? CTs 03/05/2019, compared to outside CTs from 11/22/2018-worsened wall thickening in the cecum stable apple core lesion in the mid transverse colon, no evidence of recurrent tumor at the distal colonic anastomosis, no evidence of metastatic disease ? Colonoscopy 03/24/2019 20-2 malignant appearing masses noted in the  colon, 1 filledthe cecum measuring approximately 4 cm across and friable. The other malignant appearing mass was circumferential creating a stricture with a 1.2 cm lumen located in the transverse segment approximately 4 to 5 cm in length. There were 12-18 neoplastic appearing polyps in between the 2 obvious malignant masses ranging in size from 3 mm to 2 cm. 5 polyps distal to the transverse colon mass. 2 sigmoid colon polyps. Cecum mass positive for adenocarcinoma. Transverse colon mass positive for adenocarcinoma. ? 05/07/2019 laparoscopic extended right hemicolectomy, lysis of adhesions-5 cm invasive moderately differentiated adenocarcinoma involving cecum and proximal ascending colon, carcinoma invades into the pericolonic soft tissue; 7 cm invasive moderately differentiated carcinoma involving the transverse colon, carcinoma invades into the serosal surface (visceral peritoneum); all resection margins negative for carcinoma. Lymphovascular invasion present. In the proximal colon metastatic carcinoma involves 3 of 17 lymph nodes with 1 tumor deposit. In the transverse colon 12 lymph nodes negative for metastatic carcinoma. 3 separate polyps: Tubular adenoma, inflammatory polyp and hyperplastic polyp ? Cycle 1 capecitabine 06/02/2019 ? Cycle2capecitabine 06/23/2019 ? Cycle 3 capecitabine 07/14/2019 ? Cycle 4 capecitabine 08/04/2019 ? Cycle 5 capecitabine 08/25/2019 ? Cycle 6 capecitabine 09/15/2019 ? Cycle 7 capecitabine 10/06/2019 ? Cycle 8 capecitabine 10/27/2019 2. Microcytic anemia secondary to #1.Resolved 3. Family history of pancreas and colon cancer, negative genetic testingper the Invitaepanel, ATM variant of unknown significance    Disposition: [name] has completed the course of adjuvant capecitabine.  He tolerated the treatment well.  The hyperpigmentation and skin changes at the face and hands should improve over the next few weeks.  He is in clinical remission  from colon cancer.  He is scheduled to see Dr. Ardis Hughs next month.  He will return for an office  visit and CEA in 3 months.   I discussed the assessment and treatment plan with the patient. The patient was provided an opportunity to ask questions and all were answered. The patient agreed with the plan and demonstrated an understanding of the instructions.   The patient was advised to call back or seek an in-person evaluation if the symptoms worsen or if the condition fails to improve as anticipated.  I provided 15  minutes of video, chart review, and documentation time during this encounter, and > 50% was spent counseling as documented under my assessment & plan.  Betsy Coder ANP/GNP-BC   12/01/2019 11:05 AM

## 2019-12-01 NOTE — Telephone Encounter (Signed)
Changed appt to virtual visit per print out with handwritten instructions. Pt's son is aware of appt change.

## 2019-12-05 ENCOUNTER — Encounter: Payer: Self-pay | Admitting: Oncology

## 2019-12-05 ENCOUNTER — Telehealth: Payer: Self-pay | Admitting: Oncology

## 2019-12-05 NOTE — Telephone Encounter (Signed)
Scheduled appointments per 8/13 scheduling message. Patient is aware of appointment date and time.

## 2019-12-24 ENCOUNTER — Ambulatory Visit (INDEPENDENT_AMBULATORY_CARE_PROVIDER_SITE_OTHER): Payer: Self-pay | Admitting: Gastroenterology

## 2019-12-24 ENCOUNTER — Encounter: Payer: Self-pay | Admitting: Gastroenterology

## 2019-12-24 VITALS — BP 120/72 | HR 70 | Ht 68.5 in | Wt 174.4 lb

## 2019-12-24 DIAGNOSIS — Z85038 Personal history of other malignant neoplasm of large intestine: Secondary | ICD-10-CM

## 2019-12-24 NOTE — Patient Instructions (Signed)
If you are age 81 or older, your body mass index should be between 23-30. Your Body mass index is 26.13 kg/m. If this is out of the aforementioned range listed, please consider follow up with your Primary Care Provider.  If you are age 67 or younger, your body mass index should be between 19-25. Your Body mass index is 26.13 kg/m. If this is out of the aformentioned range listed, please consider follow up with your Primary Care Provider.   You will have colonoscopy in January 2022.  Someone will call you to schedule.  Thank you for entrusting me with your care and choosing [facility].  Dr Ardis Hughs

## 2019-12-24 NOTE — Progress Notes (Signed)
Review of pertinent gastrointestinal problems: 1. Personal history of colon cancer (multifocal); Left (segmental) colectomy [place] 11/2018; rising CEA, abnormal CT scan in  [place].  Colonoscopy November 2020 found right-sided colon cancer (T3N1b) and also transverse colon cancer (T4aN0).  Also 12-18 polyps (not removed) between those 2 cancers and 5 polyps distal to the transverse colon cancer (removed).  Status post right hemicolectomy January 2021.    HPI: This is a very pleasant 79 year old man whom I last saw at the time of colonoscopy November 2020 at which time I diagnosed him with 2 separate colon cancers.  Since then he has undergone right hemicolectomy.  This was 5 months after his segmental left colon resection for colon cancer in [place].  I suspect that is likely that they never evaluated his more proximal colon before him undergoing surgery.  Since his surgery here January 2021 he has done quite well.  He has no overt bleeding.  He has tolerated adjuvant chemotherapy quite well.  He has 2 solid bowel movements every single day.   ROS: complete GI ROS as described in HPI, all other review negative.  Constitutional:  No unintentional weight loss   Past Medical History:  Diagnosis Date  . Anemia   . Blood transfusion without reported diagnosis   . Chronic kidney disease    kidney stones 40 years ago  . Colon cancer [facility])     Past Surgical History:  Procedure Laterality Date  . APPENDECTOMY    . COLON SURGERY     in [place], Aug 2020  . LAPAROSCOPIC RIGHT HEMI COLECTOMY Right 05/07/2019   Procedure: LAPAROSCOPIC RIGHT HEMI COLECTOMY;  Surgeon: Ileana Roup, MD;  Location: [facility] ORS;  Service: General;  Laterality: Right;  . TONSILLECTOMY      Current Outpatient Medications  Medication Sig Dispense Refill  . Ascorbic Acid (VITAMIN C) 1000 MG tablet Take 1,000 mg by mouth daily.    . Famotidine (PEPCID PO) Take 20 mg by mouth daily as needed.     Current  Facility-Administered Medications  Medication Dose Route Frequency Provider Last Rate Last Admin  . 0.9 %  sodium chloride infusion  500 mL Intravenous Once Milus Banister, MD        Allergies as of 12/24/2019  . (No Known Allergies)    Family History  Problem Relation Age of Onset  . Breast cancer Mother 89       spread to pancreas  . Pancreatic cancer Mother   . Colon cancer Paternal Uncle 64  . Leukemia Sister 20       acute - died 2 months after diagnosis  . Cancer Niece 42       unknown cancer of the nose  . Stomach cancer Neg Hx   . Esophageal cancer Neg Hx   . Rectal cancer Neg Hx     Social History   Socioeconomic History  . Marital status: Married    Spouse name: Not on file  . Number of children: Not on file  . Years of education: Not on file  . Highest education level: Not on file  Occupational History  . Not on file  Tobacco Use  . Smoking status: Never Smoker  . Smokeless tobacco: Never Used  Vaping Use  . Vaping Use: Never used  Substance and Sexual Activity  . Alcohol use: Yes    Alcohol/week: 3.0 - 4.0 standard drinks    Types: 3 - 4 Shots of liquor per week    Comment:  occ  . Drug use: Never  . Sexual activity: Yes    Partners: Female  Other Topics Concern  . Not on file  Social History Narrative  . Not on file   Social Determinants of Health   Financial Resource Strain:   . Difficulty of Paying Living Expenses: Not on file  Food Insecurity:   . Worried About Charity fundraiser in the Last Year: Not on file  . Ran Out of Food in the Last Year: Not on file  Transportation Needs:   . Lack of Transportation (Medical): Not on file  . Lack of Transportation (Non-Medical): Not on file  Physical Activity:   . Days of Exercise per Week: Not on file  . Minutes of Exercise per Session: Not on file  Stress:   . Feeling of Stress : Not on file  Social Connections:   . Frequency of Communication with Friends and Family: Not on file  .  Frequency of Social Gatherings with Friends and Family: Not on file  . Attends Religious Services: Not on file  . Active Member of Clubs or Organizations: Not on file  . Attends Archivist Meetings: Not on file  . Marital Status: Not on file  Intimate Partner Violence:   . Fear of Current or Ex-Partner: Not on file  . Emotionally Abused: Not on file  . Physically Abused: Not on file  . Sexually Abused: Not on file     Physical Exam: BP 120/72   Pulse 70   Ht 5' 8.5" (1.74 m)   Wt 174 lb 6 oz (79.1 kg)   BMI 26.13 kg/m  Constitutional: generally well-appearing Psychiatric: alert and oriented x3 Abdomen: soft, nontender, nondistended, no obvious ascites, no peritoneal signs, normal bowel sounds No peripheral edema noted in lower extremities  Assessment and plan: 79 y.o. male with personal history of multifocal, synchronous colon cancers  Sigmoid colon cancer removed in [place] August 2020, ascending and transverse colon cancers removed here in [facility] January 2021.  I explained to him that he probably has about one fourth of his colon left in place.  I am impressed that he still has quite solid stools twice daily.  I recommended a surveillance colonoscopy in January 2022 which would be 1 year from his last surgery.  We will contact him as that time gets closer.  Please see the "Patient Instructions" section for addition details about the plan.  Owens Loffler, MD [facility] 12/24/2019, 3:50 PM   Total time on date of encounter was 30 minutes (this included time spent preparing to see the patient reviewing records; obtaining and/or reviewing separately obtained history; performing a medically appropriate exam and/or evaluation; counseling and educating the patient and family if present; ordering medications, tests or procedures if applicable; and documenting clinical information in the health record).

## 2020-03-02 ENCOUNTER — Other Ambulatory Visit: Payer: Self-pay

## 2020-03-02 ENCOUNTER — Ambulatory Visit: Payer: Self-pay | Admitting: Nurse Practitioner

## 2020-03-03 ENCOUNTER — Inpatient Hospital Stay (HOSPITAL_BASED_OUTPATIENT_CLINIC_OR_DEPARTMENT_OTHER): Payer: Self-pay | Admitting: Nurse Practitioner

## 2020-03-03 ENCOUNTER — Other Ambulatory Visit: Payer: Self-pay

## 2020-03-03 ENCOUNTER — Encounter: Payer: Self-pay | Admitting: Nurse Practitioner

## 2020-03-03 ENCOUNTER — Inpatient Hospital Stay: Payer: Self-pay | Attending: Oncology

## 2020-03-03 VITALS — BP 147/57 | HR 70 | Temp 97.8°F | Resp 16 | Ht 68.5 in | Wt 177.8 lb

## 2020-03-03 DIAGNOSIS — C187 Malignant neoplasm of sigmoid colon: Secondary | ICD-10-CM

## 2020-03-03 DIAGNOSIS — Z85038 Personal history of other malignant neoplasm of large intestine: Secondary | ICD-10-CM | POA: Insufficient documentation

## 2020-03-03 DIAGNOSIS — Z23 Encounter for immunization: Secondary | ICD-10-CM

## 2020-03-03 DIAGNOSIS — Z9049 Acquired absence of other specified parts of digestive tract: Secondary | ICD-10-CM | POA: Insufficient documentation

## 2020-03-03 LAB — CBC WITH DIFFERENTIAL (CANCER CENTER ONLY)
Abs Immature Granulocytes: 0.03 10*3/uL (ref 0.00–0.07)
Basophils Absolute: 0 10*3/uL (ref 0.0–0.1)
Basophils Relative: 0 %
Eosinophils Absolute: 0.1 10*3/uL (ref 0.0–0.5)
Eosinophils Relative: 1 %
HCT: 42 % (ref 39.0–52.0)
Hemoglobin: 14.3 g/dL (ref 13.0–17.0)
Immature Granulocytes: 0 %
Lymphocytes Relative: 19 %
Lymphs Abs: 1.8 10*3/uL (ref 0.7–4.0)
MCH: 30.9 pg (ref 26.0–34.0)
MCHC: 34 g/dL (ref 30.0–36.0)
MCV: 90.7 fL (ref 80.0–100.0)
Monocytes Absolute: 1 10*3/uL (ref 0.1–1.0)
Monocytes Relative: 11 %
Neutro Abs: 6.4 10*3/uL (ref 1.7–7.7)
Neutrophils Relative %: 69 %
Platelet Count: 181 10*3/uL (ref 150–400)
RBC: 4.63 MIL/uL (ref 4.22–5.81)
RDW: 12.8 % (ref 11.5–15.5)
WBC Count: 9.4 10*3/uL (ref 4.0–10.5)
nRBC: 0 % (ref 0.0–0.2)

## 2020-03-03 LAB — CEA (IN HOUSE-CHCC): CEA (CHCC-In House): 1.78 ng/mL (ref 0.00–5.00)

## 2020-03-03 MED ORDER — INFLUENZA VAC A&B SA ADJ QUAD 0.5 ML IM PRSY
PREFILLED_SYRINGE | INTRAMUSCULAR | Status: AC
  Filled 2020-03-03: qty 0.5

## 2020-03-03 MED ORDER — INFLUENZA VAC A&B SA ADJ QUAD 0.5 ML IM PRSY
0.5000 mL | PREFILLED_SYRINGE | Freq: Once | INTRAMUSCULAR | Status: AC
  Administered 2020-03-03: 0.5 mL via INTRAMUSCULAR

## 2020-03-03 NOTE — Progress Notes (Signed)
John Vance   Diagnosis: Colon cancer  INTERVAL HISTORY:   [name] returns as scheduled.  He is accompanied by his son.  Bowels moving regularly, 2 times a day.  No blood or pain with bowel movements.  No abdominal pain.  He has a good appetite.  He had an episode of indigestion yesterday when he laid down after eating.  Objective:  Vital signs in last 24 hours:  Blood pressure (!) 147/57, pulse 70, temperature 97.8 F (36.6 C), temperature source Tympanic, resp. rate 16, height 5' 8.5" (1.74 m), weight 177 lb 12.8 oz (80.6 kg), SpO2 100 %.    HEENT: Neck without mass. Lymphatics: No palpable cervical, supraclavicular, axillary or inguinal lymph nodes. Resp: Lungs clear bilaterally. Cardio: Regular rate and rhythm. GI: Abdomen soft and nontender.  No hepatomegaly. Vascular: No leg edema.    Lab Results:  Lab Results  Component Value Date   WBC 9.4 03/03/2020   HGB 14.3 03/03/2020   HCT 42.0 03/03/2020   MCV 90.7 03/03/2020   PLT 181 03/03/2020   NEUTROABS 6.4 03/03/2020    Imaging:  No results found.  Medications: I have reviewed the patient's current medications.  Assessment/Plan: 1. Sigmoid colon cancer, T2N0, stage I, status post a left hemicolectomy on 12/06/2018; cecum/proximal ascending colon cancer stage IIIB (T3N1b), transverse colon cancer stage IIB (T4aN0) status post extended right hemicolectomy 05/07/2019 ? Tumor invasive superficial portion of the muscle ("internal third "), no tumor perforation, no vascular invasion or perineural invasion, negative resection margins, 0/4 lymph nodes ? Elevated CEA 02/10/2019 ? CTs 03/05/2019, compared to outside CTs from 11/22/2018-worsened wall thickening in the cecum stable apple core lesion in the mid transverse colon, no evidence of recurrent tumor at the distal colonic anastomosis, no evidence of metastatic disease ? Colonoscopy 03/24/2019 20-2 malignant appearing masses  noted in the colon, 1 filledthe cecum measuring approximately 4 cm across and friable. The other malignant appearing mass was circumferential creating a stricture with a 1.2 cm lumen located in the transverse segment approximately 4 to 5 cm in length. There were 12-18 neoplastic appearing polyps in between the 2 obvious malignant masses ranging in size from 3 mm to 2 cm. 5 polyps distal to the transverse colon mass. 2 sigmoid colon polyps. Cecum mass positive for adenocarcinoma. Transverse colon mass positive for adenocarcinoma. ? 05/07/2019 laparoscopic extended right hemicolectomy, lysis of adhesions-5 cm invasive moderately differentiated adenocarcinoma involving cecum and proximal ascending colon, carcinoma invades into the pericolonic soft tissue; 7 cm invasive moderately differentiated carcinoma involving the transverse colon, carcinoma invades into the serosal surface (visceral peritoneum); all resection margins negative for carcinoma. Lymphovascular invasion present. In the proximal colon metastatic carcinoma involves 3 of 17 lymph nodes with 1 tumor deposit. In the transverse colon 12 lymph nodes negative for metastatic carcinoma. 3 separate polyps: Tubular adenoma, inflammatory polyp and hyperplastic polyp ? Cycle 1 capecitabine 06/02/2019 ? Cycle2capecitabine 06/23/2019 ? Cycle 3 capecitabine 07/14/2019 ? Cycle 4 capecitabine 08/04/2019 ? Cycle 5 capecitabine 08/25/2019 ? Cycle 6 capecitabine 09/15/2019 ? Cycle 7 capecitabine 10/06/2019 ? Cycle 8 capecitabine 10/27/2019 2. Microcytic anemia secondary to #1.Resolved 3. Family history of pancreas and colon cancer, negative genetic testingper the Invitaepanel, ATM variant of unknown significance  Disposition: [name] remains in clinical remission from colon cancer.  We will follow-up on the CEA from today.  He will have a surveillance colonoscopy in January 2022.  He will return for a CEA and follow-up visit in 3  months.  Influenza vaccine will be administered today.  Ned Card ANP/GNP-BC   03/03/2020  2:27 PM

## 2020-03-05 ENCOUNTER — Telehealth: Payer: Self-pay | Admitting: *Deleted

## 2020-03-05 ENCOUNTER — Other Ambulatory Visit: Payer: Self-pay | Admitting: Nurse Practitioner

## 2020-03-05 DIAGNOSIS — C187 Malignant neoplasm of sigmoid colon: Secondary | ICD-10-CM

## 2020-03-05 NOTE — Telephone Encounter (Signed)
-----   Message from Owens Shark, NP sent at 03/05/2020  8:39 AM EST ----- Please let his son know Dr. Benay Spice would like to go ahead with surveillance CT scans.  I put in orders for lab and CT scans in approximately 2 weeks.  Thanks

## 2020-03-05 NOTE — Telephone Encounter (Signed)
Notified son of need for CT scan and labs. He will be called w/appointments.

## 2020-03-22 ENCOUNTER — Other Ambulatory Visit: Payer: Self-pay

## 2020-03-23 ENCOUNTER — Other Ambulatory Visit: Payer: Self-pay

## 2020-03-23 ENCOUNTER — Inpatient Hospital Stay: Payer: Self-pay

## 2020-03-23 ENCOUNTER — Encounter (HOSPITAL_COMMUNITY): Payer: Self-pay

## 2020-03-23 ENCOUNTER — Ambulatory Visit (HOSPITAL_COMMUNITY)
Admission: RE | Admit: 2020-03-23 | Discharge: 2020-03-23 | Disposition: A | Discharge status: Home / Self Care | Payer: Self-pay | Source: Ambulatory Visit | Attending: Nurse Practitioner | Admitting: Nurse Practitioner

## 2020-03-23 DIAGNOSIS — C187 Malignant neoplasm of sigmoid colon: Secondary | ICD-10-CM

## 2020-03-23 LAB — BASIC METABOLIC PANEL - CANCER CENTER ONLY
Anion gap: 8 (ref 5–15)
BUN: 16 mg/dL (ref 8–23)
CO2: 28 mmol/L (ref 22–32)
Calcium: 9.7 mg/dL (ref 8.9–10.3)
Chloride: 104 mmol/L (ref 98–111)
Creatinine: 0.99 mg/dL (ref 0.61–1.24)
GFR, Estimated: >60 mL/min (ref >60)
Glucose, Bld: 88 mg/dL (ref 70–99)
Potassium: 4.3 mmol/L (ref 3.5–5.1)
Sodium: 140 mmol/L (ref 135–145)

## 2020-03-23 MED ORDER — IOHEXOL 300 MG/ML  SOLN
100.0000 mL | Freq: Once | INTRAMUSCULAR | Status: AC | PRN
  Administered 2020-03-23: 100 mL via INTRAVENOUS

## 2020-04-12 ENCOUNTER — Telehealth: Payer: Self-pay

## 2020-04-12 NOTE — Telephone Encounter (Signed)
Called left message to return call to explain CT results  new small lung nodule, likely benign inflammatory, will repeat noncontrast chest CT 3-4 months, f/u as scheduled

## 2020-04-12 NOTE — Telephone Encounter (Signed)
-----   Message from Owens Shark, NP sent at 04/12/2020 10:43 AM EST ----- There is no documentation in his chart regarding the message below.  Please make sure son has been notified and there is documentation in the patient's chart. ----- Message ----- From: Owens Shark, NP Sent: 04/01/2020  10:50 AM EST To: [facility]  Please let son know there is a new small lung nodule, likely benign inflammatory, will repeat noncontrast chest CT 3-4 months, f/u as scheduled

## 2020-05-03 ENCOUNTER — Encounter: Payer: Self-pay | Admitting: Genetic Counselor

## 2020-06-03 ENCOUNTER — Inpatient Hospital Stay (HOSPITAL_BASED_OUTPATIENT_CLINIC_OR_DEPARTMENT_OTHER): Payer: Self-pay | Admitting: Oncology

## 2020-06-03 ENCOUNTER — Other Ambulatory Visit: Payer: Self-pay

## 2020-06-03 ENCOUNTER — Inpatient Hospital Stay: Payer: Self-pay | Attending: Oncology

## 2020-06-03 ENCOUNTER — Telehealth: Payer: Self-pay | Admitting: *Deleted

## 2020-06-03 VITALS — BP 141/63 | HR 66 | Temp 98.6°F | Resp 16 | Ht 68.5 in | Wt 172.2 lb

## 2020-06-03 DIAGNOSIS — C187 Malignant neoplasm of sigmoid colon: Secondary | ICD-10-CM

## 2020-06-03 DIAGNOSIS — Z85038 Personal history of other malignant neoplasm of large intestine: Secondary | ICD-10-CM | POA: Insufficient documentation

## 2020-06-03 LAB — CEA (IN HOUSE-CHCC): CEA (CHCC-In House): 3.66 ng/mL (ref 0.00–5.00)

## 2020-06-03 NOTE — Progress Notes (Signed)
[name] OFFICE PROGRESS NOTE   Diagnosis: Colon cancer  INTERVAL HISTORY:   John Vance returns as scheduled.  He is here with his son.  He feels well.  He has 2 bowel movements per day.  He walks daily for exercise.  He developed "heartburn".  This resolved when he started Nexium.  He is due for a colonoscopy this month.  Objective:  Vital signs in last 24 hours:  Blood pressure (!) 141/63, pulse 66, temperature 98.6 F (37 C), temperature source Tympanic, resp. rate 16, height 5' 8.5" (1.74 m), weight 172 lb 3.2 oz (78.1 kg), SpO2 100 %.   Lymphatics: No cervical, supraclavicular, axillary, or inguinal nodes Resp: Lungs clear bilaterally Cardio: Regular rate and rhythm GI: No hepatosplenomegaly, no mass, nontender Vascular: No leg edema Skin: Dryness of the hands and feet   Lab Results:  Lab Results  Component Value Date   WBC 9.4 03/03/2020   HGB 14.3 03/03/2020   HCT 42.0 03/03/2020   MCV 90.7 03/03/2020   PLT 181 03/03/2020   NEUTROABS 6.4 03/03/2020    CMP  Lab Results  Component Value Date   NA 140 03/23/2020   K 4.3 03/23/2020   CL 104 03/23/2020   CO2 28 03/23/2020   GLUCOSE 88 03/23/2020   BUN 16 03/23/2020   CREATININE 0.99 03/23/2020   CALCIUM 9.7 03/23/2020   PROT 6.8 12/01/2019   ALBUMIN 3.6 12/01/2019   AST 20 12/01/2019   ALT 16 12/01/2019   ALKPHOS 91 12/01/2019   BILITOT 0.8 12/01/2019   GFRNONAA >60 03/23/2020   GFRAA >60 12/01/2019    Lab Results  Component Value Date   CEA1 3.66 06/03/2020    Medications: I have reviewed the patient's current medications.   Assessment/Plan:  1. Sigmoid colon cancer, T2N0, stage I, status post a left hemicolectomy on 12/06/2018; cecum/proximal ascending colon cancer stage IIIB (T3N1b), transverse colon cancer stage IIB (T4aN0) status post extended right hemicolectomy 05/07/2019 ? Tumor invasive superficial portion of the muscle ("internal third "), no tumor perforation, no  vascular invasion or perineural invasion, negative resection margins, 0/4 lymph nodes ? Elevated CEA 02/10/2019 ? CTs 03/05/2019, compared to outside CTs from 11/22/2018-worsened wall thickening in the cecum stable apple core lesion in the mid transverse colon, no evidence of recurrent tumor at the distal colonic anastomosis, no evidence of metastatic disease ? Colonoscopy 03/24/2019 20-2 malignant appearing masses noted in the colon, 1 filledthe cecum measuring approximately 4 cm across and friable. The other malignant appearing mass was circumferential creating a stricture with a 1.2 cm lumen located in the transverse segment approximately 4 to 5 cm in length. There were 12-18 neoplastic appearing polyps in between the 2 obvious malignant masses ranging in size from 3 mm to 2 cm. 5 polyps distal to the transverse colon mass. 2 sigmoid colon polyps. Cecum mass positive for adenocarcinoma. Transverse colon mass positive for adenocarcinoma. ? 05/07/2019 laparoscopic extended right hemicolectomy, lysis of adhesions-5 cm invasive moderately differentiated adenocarcinoma involving cecum and proximal ascending colon, carcinoma invades into the pericolonic soft tissue; 7 cm invasive moderately differentiated carcinoma involving the transverse colon, carcinoma invades into the serosal surface (visceral peritoneum); all resection margins negative for carcinoma. Lymphovascular invasion present. In the proximal colon metastatic carcinoma involves 3 of 17 lymph nodes with 1 tumor deposit. In the transverse colon 12 lymph nodes negative for metastatic carcinoma. 3 separate polyps: Tubular adenoma, inflammatory polyp and hyperplastic polyp ? Cycle 1 capecitabine 06/02/2019 ? Cycle2capecitabine  06/23/2019 ? Cycle 3 capecitabine 07/14/2019 ? Cycle 4 capecitabine 08/04/2019 ? Cycle 5 capecitabine 08/25/2019 ? Cycle 6 capecitabine 09/15/2019 ? Cycle 7 capecitabine 10/06/2019 ? Cycle 8 capecitabine 10/27/2019 2.  Microcytic anemia secondary to #1.Resolved 3. Family history of pancreas and colon cancer, negative genetic testingper the Invitaepanel, ATM variant of unknown significance   Disposition: John Vance is in remission from colon cancer.  The CEA is normal today, but slightly higher compared to when he was here 3 months ago.  We will ask him to return for a CEA in 2 months.  He will be scheduled for an office visit in 6 months.  I will contact Dr. Ardis Hughs regarding the indication for a surveillance colonoscopy.  Betsy Coder, MD  06/03/2020  1:15 PM

## 2020-06-03 NOTE — Telephone Encounter (Signed)
Notified son of normal CEA, but has increased. MD wants to recheck in 2 months. He agrees to plan. Scheduler will call him and scheduling message sent.

## 2020-06-08 ENCOUNTER — Ambulatory Visit: Payer: Self-pay | Admitting: Nurse Practitioner

## 2020-06-08 ENCOUNTER — Ambulatory Visit (INDEPENDENT_AMBULATORY_CARE_PROVIDER_SITE_OTHER): Payer: Self-pay | Admitting: Nurse Practitioner

## 2020-06-08 ENCOUNTER — Encounter: Payer: Self-pay | Admitting: Nurse Practitioner

## 2020-06-08 VITALS — BP 142/62 | HR 67 | Ht 68.5 in | Wt 173.0 lb

## 2020-06-08 DIAGNOSIS — K219 Gastro-esophageal reflux disease without esophagitis: Secondary | ICD-10-CM

## 2020-06-08 DIAGNOSIS — R933 Abnormal findings on diagnostic imaging of other parts of digestive tract: Secondary | ICD-10-CM

## 2020-06-08 DIAGNOSIS — R12 Heartburn: Secondary | ICD-10-CM

## 2020-06-08 DIAGNOSIS — D124 Benign neoplasm of descending colon: Secondary | ICD-10-CM

## 2020-06-08 DIAGNOSIS — Z85038 Personal history of other malignant neoplasm of large intestine: Secondary | ICD-10-CM

## 2020-06-08 DIAGNOSIS — C187 Malignant neoplasm of sigmoid colon: Secondary | ICD-10-CM

## 2020-06-08 MED ORDER — SUPREP BOWEL PREP KIT 17.5-3.13-1.6 GM/177ML PO SOLN: 1.0000 | ORAL | 0 refills | Status: DC

## 2020-06-08 NOTE — Progress Notes (Signed)
I agree with the above note, plan 

## 2020-06-08 NOTE — Progress Notes (Signed)
06/08/2020 [address] [place] Vance [id number] Sep 04, 1940   Chief Complaint:  Schedule a colonoscopy   History of Present Illness: John Vance is a delightful 80 year old male with a past medical history of kidney stones, chronic kidney disease, sigmoid colon cancer stage I s/p hemicolectomy 12/06/2018 in [place], cecum/proximal ascending colon cancer IIIB, transverse colon cancer stage IIB s/p extended right hemicolectomy 05/07/2019. Past appendectomy and tonsillectomy.  He was last seen in our office by Dr. Ardis Hughs on 12/24/2019, at that time he was doing quite well following his right hemicolectomy surgery and chemotherapy (Capecitabine x 8 cycles completed 10/27/2019) and he was advised by Dr. Ardis Hughs to schedule a surveillance colonoscopy Jan. 2022.   He speaks [place].  He is accompanied by his son John Vance who speaks English fluently and he is interpreting to facilitate communication during today's office consultation.  He denies having any upper or lower abdominal pain.  He is passing a normal formed brown bowel movement twice daily.  CEA level 3.66 on 06/03/2020. He developed daily heartburn December 2021.  No specific food or stress triggers. No NSAID use. He took Nexium 20 mg p.o. twice daily for 4 weeks then reduced the Nexium to once daily about 3 weeks ago as his heartburn symptoms abated.  No dysphagia.  He underwent a surveillance chest CT 03/23/2020 which identified stable mid distal esophageal wall thickening, esophagitis could not be excluded.  He reported undergoing an EGD at the time of his colonoscopy in [place] 11/2018 which showed gastritis.  His appetite is good.  His weight is stable.  He denies having any of her, sweats or chills.  He remains quite active.  CBC Latest Ref Rng & Units 03/03/2020 12/01/2019 10/21/2019  WBC 4.0 - 10.5 K/uL 9.4 5.6 6.0  Hemoglobin 13.0 - 17.0 g/dL 14.3 14.2 13.1  Hematocrit 39.0 - 52.0 % 42.0 41.9 37.9(L)  Platelets 150 - 400 K/uL 181 138(L)  163    Colonoscopy by Dr. Ardis Hughs 03/24/2019: - Two malignant appearing masses were noted in the colon. One filled the cecum (biopsied, path jar 1), it was approximately 4cm across and friable. The other malignant appearing mass was circumferential, creating a stricture with a 1.2cm lumen, located in the transverse segment, approximately 4-5cm in length (biopsied, path jar 2). The distal edge of the transverse colon mass was labeled with submucosal injection of Spot. There were 12-18 neoplastic appearing polyps in between the two obvious malignant masses ranging in size from 83mm to 2cm. None of these were removed. There were five polyps distal to the transverese colon mass which I removed. Three polyps were in the descending segment, the largest was 1.5cm and it was removed with snare/cautery. The other two descending colon polyps were 6-81mm across, sessile, removed with cold snare. All of the descending polyp were sent to pathology (jar 3). There were two sigmoid colon polyps. The largest was located at 20cm from the anus, about 5cm distal to the colo-colonic anastomosis. This polyp was 1.4cm, removed with cold snare. The site bled more than is usual and so I placed a single endcoclip at the site resulting in immediate hemostasis. The other sigmoid polyp was 26mm across and removed with cold snare. Both sigmoid polyp were retrieved (path jar 4). Normal appearing colo-colonic anastomosis at 25 cm from the anus. 1. Surgical [P], colon, cecum - ADENOCARCINOMA - SEE COMMENT 2. Surgical [P], colon, transverse - ADENOCARCINOMA - SEE COMMENT 3. Surgical [P], colon, descending, polyp (3) - TUBULAR  ADENOMA (4 OF 5 FRAGMENTS) - HYPERPLASTIC POLYP (1 OF 5 FRAGMENTS) - NO HIGH GRADE DYSPLASIA OR MALIGNANCY IDENTIFIED 4. Surgical [P], colon, sigmoid (at 20cm), sigmoid, polyp (2) - TUBULAR ADENOMA (1 OF 1 FRAGMENTS) - NO HIGH GRADE DYSPLASIA OR MALIGNANCY IDENTIFIED  Past Medical History:   Diagnosis Date  . Anemia   . Blood transfusion without reported diagnosis   . Chronic kidney disease    kidney stones 40 years ago  . Colon cancer [facility])     Past Surgical History:  Procedure Laterality Date  . APPENDECTOMY    . COLON SURGERY     in [place], Aug 2020  . LAPAROSCOPIC RIGHT HEMI COLECTOMY Right 05/07/2019   Procedure: LAPAROSCOPIC RIGHT HEMI COLECTOMY;  Surgeon: Ileana Roup, MD;  Location: [facility];  Service: General;  Laterality: Right;  . TONSILLECTOMY      Current Medications, Allergies, Past Medical History, Past Surgical History, Family History and Social History were reviewed in [employer] record.   Review of Systems:   Constitutional: Negative for fever, sweats, chills or weight loss.  Respiratory: Negative for shortness of breath.   Cardiovascular: Negative for chest pain, palpitations and leg swelling.  Gastrointestinal: See HPI.  Musculoskeletal: Negative for back pain or muscle aches.  Neurological: Negative for dizziness, headaches or paresthesias.    Physical Exam: BP (!) 142/62   Pulse 67   Ht 5' 8.5" (1.74 m)   Wt 173 lb (78.5 kg)   BMI 25.92 kg/m  General:  80 year old male in no acute distress. Head: Normocephalic and atraumatic. Eyes: No scleral icterus. Conjunctiva pink . Ears: Normal auditory acuity. Mouth: Upper bridge. No ulcers or lesions.  Lungs: Clear throughout to auscultation. Heart: Regular rate and rhythm, no murmur. Abdomen: Soft, nontender and nondistended. No masses or hepatomegaly. Normal bowel sounds x 4 quadrants.  Midline abdominal scar intact. Rectal: Deferred.  Musculoskeletal: Symmetrical with no gross deformities. Extremities: No edema. Neurological: Alert oriented x 4. No focal deficits.  Psychological: Alert and cooperative. Normal mood and affect  Assessment and Recommendations:  85. 80 year old male with a history of sigmoid colon cancer stage I s/p hemicolectomy 12/06/2018  in [place], cecum/proximal ascending colon cancer IIIB, transverse colon cancer stage IIB s/p extended right hemicolectomy 05/07/2019 presents today to schedule a surveillance colonoscopy.  -Colonoscopy benefits and risks discussed including risk with sedation, risk of bleeding, perforation and infection   2.  GERD symptoms. Chest CT 03/23/2020 identified stable mild distal esophageal wall thickening, cannot exclude esophagitis. -Continue Nexium 40 mg once a day -EGD benefits and risks discussed including risk with sedation, risk of bleeding, perforation and infection   Further recommendations to be determined after EGD and colonoscopy completed

## 2020-06-08 NOTE — Patient Instructions (Signed)
If you are age 82 or older, your body mass index should be between 23-30. Your Body mass index is 25.92 kg/m. If this is out of the aforementioned range listed, please consider follow up with your Primary Care Provider.  If you are age 66 or younger, your body mass index should be between 19-25. Your Body mass index is 25.92 kg/m. If this is out of the aformentioned range listed, please consider follow up with your Primary Care Provider.   PROCEDURES:  You have been scheduled for an endoscopy and colonoscopy. Please follow the written instructions given to you at your visit today. Please pick up your prep supplies at the pharmacy within the next 1-3 days. If you use inhalers (even only as needed), please bring them with you on the day of your procedure.  It was great seeing you today!  Thank you for entrusting me with your care and choosing [facility].  Noralyn Pick, CRNP

## 2020-06-15 ENCOUNTER — Encounter: Payer: Self-pay | Admitting: Gastroenterology

## 2020-07-23 ENCOUNTER — Other Ambulatory Visit: Payer: Self-pay

## 2020-07-23 ENCOUNTER — Ambulatory Visit (AMBULATORY_SURGERY_CENTER): Payer: Self-pay | Admitting: Gastroenterology

## 2020-07-23 ENCOUNTER — Encounter: Payer: Self-pay | Admitting: Gastroenterology

## 2020-07-23 VITALS — BP 138/75 | HR 70 | Temp 98.2°F | Resp 19 | Ht 68.0 in | Wt 173.0 lb

## 2020-07-23 DIAGNOSIS — D124 Benign neoplasm of descending colon: Secondary | ICD-10-CM

## 2020-07-23 DIAGNOSIS — Z85038 Personal history of other malignant neoplasm of large intestine: Secondary | ICD-10-CM

## 2020-07-23 DIAGNOSIS — R935 Abnormal findings on diagnostic imaging of other abdominal regions, including retroperitoneum: Secondary | ICD-10-CM

## 2020-07-23 DIAGNOSIS — C187 Malignant neoplasm of sigmoid colon: Secondary | ICD-10-CM

## 2020-07-23 MED ORDER — SODIUM CHLORIDE 0.9 % IV SOLN: 500.0000 mL | Freq: Once | INTRAVENOUS | Status: DC

## 2020-07-23 NOTE — Op Note (Addendum)
[place] Patient Name: John Vance Procedure Date: 07/23/2020 2:44 PM MRN: [id number] Endoscopist: Milus Banister , MD Age: 80 Referring MD:  Date of Birth: 12-15-1940 Gender: Male Account #: [id number] Procedure:                Colonoscopy Indications:              High risk colon cancer surveillance: Personal                            history of colon cancer; Personal?history of colon                            cancer (multifocal); Left (segmental)                            colectomy?[place] 11/2018; rising CEA, abnormal CT                            scan in ?[place]. Colonoscopy November 2020 found                            right-sided colon cancer (T3N1b) and also                            transverse colon cancer (T4aN0). Also 12-18 polyps                            (not removed) between those 2 cancers and 5 polyps                            distal to the transverse colon cancer (removed).                            Status post right hemicolectomy January 2021 Medicines:                Monitored Anesthesia Care Procedure:                Pre-Anesthesia Assessment:                           - Prior to the procedure, a History and Physical                            was performed, and patient medications and                            allergies were reviewed. The patient's tolerance of                            previous anesthesia was also reviewed. The risks                            and benefits of the procedure and the sedation  options and risks were discussed with the patient.                            All questions were answered, and informed consent                            was obtained. Prior Anticoagulants: The patient has                            taken no previous anticoagulant or antiplatelet                            agents. ASA Grade Assessment: II - A patient with                            mild systemic  disease. After reviewing the risks                            and benefits, the patient was deemed in                            satisfactory condition to undergo the procedure.                           After obtaining informed consent, the colonoscope                            was passed under direct vision. Throughout the                            procedure, the patient's blood pressure, pulse, and                            oxygen saturations were monitored continuously. The                            Olympus CF-HQ190L ([id number]) Colonoscope was                            introduced through the anus and advanced to the                            ileocecal anastomosis (about 50-60cm from the                            anus). The colonoscopy was performed without                            difficulty. The patient tolerated the procedure                            well. The quality of the bowel preparation was  good. The ileocecal valve, appendiceal orifice, and                            rectum were photographed. Scope In: 2:50:47 PM Scope Out: 2:56:36 PM Scope Withdrawal Time: 0 hours 4 minutes 22 seconds  Total Procedure Duration: 0 hours 5 minutes 49 seconds  Findings:                 Two anastomosis, both were normal appearing.                            Ileocolononic anastomosis from extended right                            hemicolectomy (2021, [place] surgery) and colo-colonic                            anastomosis (2020 [place] surgery)                           A 5 mm polyp was found in the descending colon. The                            polyp was sessile. The polyp was removed with a                            cold snare. Resection and retrieval were complete.                           Multiple small and large-mouthed diverticula were                            found in the left colon.                           The exam was otherwise without  abnormality on                            direct and retroflexion views. Complications:            No immediate complications. Estimated Blood Loss:     Estimated blood loss: none. Impression:               - Two anastomosis, both were normal appearing.                            Ileocolononic anastomosis from extended right                            hemicolectomy (2021, [place] surgery) and colo-colonic                            anastomosis (2020 [place] surgery)                           - One 5 mm polyp in the descending colon, removed  with a cold snare. Resected and retrieved.                           - Diverticulosis in the left colon.                           - The examination was otherwise normal on direct                            and retroflexion views. Recommendation:           - Patient has a contact number available for                            emergencies. The signs and symptoms of potential                            delayed complications were discussed with the                            patient. Return to normal activities tomorrow.                            Written discharge instructions were provided to the                            patient.                           - Resume previous diet.                           - Continue present medications.                           - Await pathology results. Milus Banister, MD 07/23/2020 3:06:30 PM This report has been signed electronically.

## 2020-07-23 NOTE — Progress Notes (Signed)
A and O x3. Report to RN. Tolerated MAC anesthesia well.Teeth unchanged after procedure.

## 2020-07-23 NOTE — Progress Notes (Signed)
Medical history reviewed with no changes noted. VS assessed by C.W 

## 2020-07-23 NOTE — Patient Instructions (Signed)
USTED TUVO UN PROCEDIMIENTO ENDOSCPICO HOY EN EL [facility]:   Lea el informe del procedimiento que se le entreg para cualquier pregunta especfica sobre lo que se encontr durante su examen.  Si el informe del examen no responde a sus preguntas, por favor llame a su gastroenterlogo para aclararlo.  Si usted solicit que no se le den detalles de lo que se encontr en su procedimiento al acompaante que le va a cuidar, entonces el informe del procedimiento se ha incluido en un sobre sellado para que usted lo revise despus cuando le sea ms conveniente.   LO QUE PUEDE ESPERAR: Algunas sensaciones de hinchazn en el abdomen.  Puede tener ms gases de lo normal.  El caminar puede ayudarle a eliminar el aire que se le puso en el tracto gastrointestinal durante el procedimiento y reducir la hinchazn.  Si le hicieron una endoscopia inferior (como una colonoscopia o una sigmoidoscopia flexible), podra notar manchas de sangre en las heces fecales o en el papel higinico.  Si se someti a una preparacin intestinal para su procedimiento, es posible que no tenga una evacuacin intestinal normal durante algunos das.   Tenga en cuenta:  Es posible que note un poco de irritacin y congestin en la nariz o algn drenaje.  Esto es debido al oxgeno utilizado durante su procedimiento.  No hay que preocuparse y esto debe desaparecer ms o menos en un da.   SNTOMAS PARA REPORTAR INMEDIATAMENTE:  Despus de una endoscopia inferior (colonoscopia o sigmoidoscopia flexible):  Cantidades excesivas de sangre en las heces fecales  Sensibilidad significativa o empeoramiento de los dolores abdominales   Hinchazn aguda del abdomen que antes no tena   Fiebre de 100F o ms   Despus de la endoscopia superior (EGD)  Vmitos de sangre o material como caf molido   Dolor en el pecho o dolor debajo de los omplatos que antes no tena   Dolor o dificultad persistente para tragar  Falta de aire que antes no  tena   Fiebre de 100F o ms  Heces fecales negras y pegajosas   Para asuntos urgentes o de emergencia, puede comunicarse con un gastroenterlogo a cualquier hora llamando al [phone].  DIETA:  Recomendamos una comida pequea al principio, pero luego puede continuar con su dieta normal.  Tome muchos lquidos, pero debe evitar las bebidas alcohlicas durante 24 horas.    ACTIVIDAD:  Debe planear tomarse las cosas con calma por el resto del da y no debe CONDUCIR ni usar maquinaria pesada hasta maana (debido a los medicamentos de sedacin utilizados durante el examen).     SEGUIMIENTO: Nuestro personal llamar al nmero que aparece en su historial al siguiente da hbil de su procedimiento para ver cmo se siente y para responder cualquier pregunta o inquietud que pueda tener con respecto a la informacin que se le dio despus del procedimiento. Si no podemos contactarle, le dejaremos un mensaje.  Sin embargo, si se siente bien y no tiene ningn problema, no es necesario que nos devuelva la llamada.  Asumiremos que ha regresado a sus actividades diarias normales sin incidentes. Si se le tomaron algunas biopsias, le contactaremos por telfono o por carta en las prximas 3 semanas.  Si no ha sabido nada sobre las biopsias en el transcurso de 3 semanas, por favor llmenos al [phone].   FIRMAS/CONFIDENCIALIDAD: Usted y/o el acompaante que le cuide han firmado documentos que se ingresarn en su historial mdico electrnico.  Estas firmas atestiguan el hecho   de que la informacin anterior  

## 2020-07-23 NOTE — Op Note (Signed)
[place] Patient Name: John Vance Procedure Date: 07/23/2020 2:43 PM MRN: [id number] Endoscopist: Milus Banister , MD Age: 80 Referring MD:  Date of Birth: 30-Aug-1940 Gender: Male Account #: [id number] Procedure:                Upper GI endoscopy Indications:              Abnormal CT of the GI tract radiologist reported                            thickened esophagus and he "could not exclude                            esophagitis" Medicines:                Monitored Anesthesia Care Procedure:                Pre-Anesthesia Assessment:                           - Prior to the procedure, a History and Physical                            was performed, and patient medications and                            allergies were reviewed. The patient's tolerance of                            previous anesthesia was also reviewed. The risks                            and benefits of the procedure and the sedation                            options and risks were discussed with the patient.                            All questions were answered, and informed consent                            was obtained. Prior Anticoagulants: The patient has                            taken no previous anticoagulant or antiplatelet                            agents. ASA Grade Assessment: III - A patient with                            severe systemic disease. After reviewing the risks                            and benefits, the patient was deemed in  satisfactory condition to undergo the procedure.                           After obtaining informed consent, the endoscope was                            passed under direct vision. Throughout the                            procedure, the patient's blood pressure, pulse, and                            oxygen saturations were monitored continuously. The                            Endoscope was introduced through the  mouth, and                            advanced to the second part of duodenum. The upper                            GI endoscopy was accomplished without difficulty.                            The patient tolerated the procedure well. Scope In: Scope Out: Findings:                 The esophagus was normal.                           The stomach was normal.                           The examined duodenum was normal. Complications:            No immediate complications. Estimated blood loss:                            None. Estimated Blood Loss:     Estimated blood loss: none. Impression:               - Normal UGI tract. The CT reported thickened                            esophagitis was a false positive Recommendation:           - Patient has a contact number available for                            emergencies. The signs and symptoms of potential                            delayed complications were discussed with the                            patient. Return to normal activities tomorrow.  Written discharge instructions were provided to the                            patient.                           - Resume previous diet.                           - Continue present medications.                           - Await pathology results. Milus Banister, MD 07/23/2020 3:09:09 PM This report has been signed electronically.

## 2020-07-23 NOTE — Progress Notes (Signed)
Called to room to assist during endoscopic procedure.  Patient ID and intended procedure confirmed with present staff. Received instructions for my participation in the procedure from the performing physician.  

## 2020-07-27 ENCOUNTER — Telehealth: Payer: Self-pay | Admitting: *Deleted

## 2020-07-27 NOTE — Telephone Encounter (Signed)
  Follow up Call-  Call back number 07/23/2020 03/24/2019  Post procedure Call Back phone  # [phone]  Permission to leave phone message Yes Yes     Patient questions:  Do you have a fever, pain , or abdominal swelling? No. Pain Score  0 *  Have you tolerated food without any problems? Yes.    Have you been able to return to your normal activities? Yes.    Do you have any questions about your discharge instructions: Diet   No. Medications  No. Follow up visit  No.  Do you have questions or concerns about your Care? Yes.    Actions: * If pain score is 4 or above: No action needed, pain <4.  1. Have you developed a fever since your procedure? no  2.   Have you had an respiratory symptoms (SOB or cough) since your procedure? no  3.   Have you tested positive for COVID 19 since your procedure no  4.   Have you had any family members/close contacts diagnosed with the COVID 19 since your procedure? no   If yes to any of these questions please route to Joylene John, RN and Joella Prince, RN

## 2020-07-30 ENCOUNTER — Encounter: Payer: Self-pay | Admitting: Gastroenterology

## 2020-08-02 ENCOUNTER — Other Ambulatory Visit: Payer: Self-pay

## 2020-08-02 ENCOUNTER — Inpatient Hospital Stay: Payer: Self-pay | Attending: Oncology

## 2020-08-02 DIAGNOSIS — C187 Malignant neoplasm of sigmoid colon: Secondary | ICD-10-CM | POA: Insufficient documentation

## 2020-08-03 ENCOUNTER — Telehealth: Payer: Self-pay

## 2020-08-03 LAB — CEA (IN HOUSE-CHCC): CEA (CHCC-In House): 2.84 ng/mL (ref 0.00–5.00)

## 2020-08-03 NOTE — Telephone Encounter (Signed)
VM message left in reference to normal  CEA results. Informed Pt to follow up as scheduled. return call if any problems or concerns.

## 2020-08-03 NOTE — Telephone Encounter (Signed)
-----   Message from Ladell Pier, MD sent at 08/03/2020 12:11 PM EDT ----- Please call patient, the CEA remains in the normal range, follow-up as scheduled

## 2020-08-24 ENCOUNTER — Other Ambulatory Visit: Payer: Self-pay | Admitting: Nurse Practitioner

## 2020-08-24 DIAGNOSIS — C187 Malignant neoplasm of sigmoid colon: Secondary | ICD-10-CM

## 2020-08-31 ENCOUNTER — Ambulatory Visit (HOSPITAL_BASED_OUTPATIENT_CLINIC_OR_DEPARTMENT_OTHER): Payer: Self-pay

## 2020-09-02 ENCOUNTER — Telehealth: Payer: Self-pay

## 2020-09-02 NOTE — Telephone Encounter (Signed)
Called spoke with son to ensure he was aware of upcoming CT scan son is aware states he called and made the appt for the 17th  Encouraged to call for any questions concerns or changes

## 2020-09-07 ENCOUNTER — Ambulatory Visit (HOSPITAL_BASED_OUTPATIENT_CLINIC_OR_DEPARTMENT_OTHER)
Admission: RE | Admit: 2020-09-07 | Discharge: 2020-09-07 | Disposition: A | Discharge status: Home / Self Care | Payer: Self-pay | Source: Ambulatory Visit | Attending: Nurse Practitioner | Admitting: Nurse Practitioner

## 2020-09-07 ENCOUNTER — Other Ambulatory Visit: Payer: Self-pay

## 2020-09-07 DIAGNOSIS — C187 Malignant neoplasm of sigmoid colon: Secondary | ICD-10-CM | POA: Insufficient documentation

## 2020-09-13 ENCOUNTER — Telehealth: Payer: Self-pay

## 2020-09-13 NOTE — Telephone Encounter (Signed)
-----   Message from Owens Shark, NP sent at 09/10/2020  3:55 PM EDT ----- Please let his son know that the lung nodule is smaller.  Follow-up as scheduled.

## 2020-09-13 NOTE — Telephone Encounter (Signed)
Called spoke with son made him aware of scan results lung nodule smaller f/u as scheduled

## 2020-12-02 ENCOUNTER — Inpatient Hospital Stay: Payer: Self-pay

## 2020-12-02 ENCOUNTER — Other Ambulatory Visit: Payer: Self-pay

## 2020-12-02 ENCOUNTER — Inpatient Hospital Stay: Payer: Self-pay | Attending: Oncology | Admitting: Oncology

## 2020-12-02 VITALS — BP 152/77 | HR 69 | Temp 97.8°F | Resp 18 | Ht 68.0 in | Wt 181.0 lb

## 2020-12-02 DIAGNOSIS — C187 Malignant neoplasm of sigmoid colon: Secondary | ICD-10-CM

## 2020-12-02 DIAGNOSIS — D509 Iron deficiency anemia, unspecified: Secondary | ICD-10-CM | POA: Insufficient documentation

## 2020-12-02 DIAGNOSIS — Z800 Family history of malignant neoplasm of digestive organs: Secondary | ICD-10-CM | POA: Insufficient documentation

## 2020-12-02 DIAGNOSIS — C182 Malignant neoplasm of ascending colon: Secondary | ICD-10-CM | POA: Insufficient documentation

## 2020-12-02 LAB — CEA (ACCESS): CEA (CHCC): 12.87 ng/mL — ABNORMAL HIGH (ref 0.00–5.00)

## 2020-12-02 NOTE — Progress Notes (Signed)
[facility] OFFICE PROGRESS NOTE   Diagnosis: Colon cancer  INTERVAL HISTORY:   John Vance returns as scheduled.  He is here today with his daughter-in-law and an interpreter.  He reports feeling well.  No difficulty with bowel function.  He has "ringing "in the ears.  No other complaint.  Objective:  Vital signs in last 24 hours:  Blood pressure (!) 152/77, pulse 69, temperature 97.8 F (36.6 C), temperature source Oral, resp. rate 18, height 5' 8"  (1.727 m), weight 181 lb (82.1 kg), SpO2 100 %.  Lymphatics: No cervical, supraclavicular, axillary, or inguinal nodes Resp: Lungs clear bilaterally Cardio: Regular rate and rhythm GI: No mass, nontender, no hepatosplenomegaly Vascular: No leg edema  Lab Results:  Lab Results  Component Value Date   WBC 9.4 03/03/2020   HGB 14.3 03/03/2020   HCT 42.0 03/03/2020   MCV 90.7 03/03/2020   PLT 181 03/03/2020   NEUTROABS 6.4 03/03/2020    CMP  Lab Results  Component Value Date   NA 140 03/23/2020   K 4.3 03/23/2020   CL 104 03/23/2020   CO2 28 03/23/2020   GLUCOSE 88 03/23/2020   BUN 16 03/23/2020   CREATININE 0.99 03/23/2020   CALCIUM 9.7 03/23/2020   PROT 6.8 12/01/2019   ALBUMIN 3.6 12/01/2019   AST 20 12/01/2019   ALT 16 12/01/2019   ALKPHOS 91 12/01/2019   BILITOT 0.8 12/01/2019   GFRNONAA >60 03/23/2020   GFRAA >60 12/01/2019    Lab Results  Component Value Date   CEA1 2.84 08/02/2020   CEA 12.87 (H) 12/02/2020     Medications: I have reviewed the patient's current medications.   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), transverse colon cancer stage IIB (T4aN0) status post extended right hemicolectomy 05/07/2019 Tumor invasive superficial portion of the muscle ("internal third "), no tumor perforation, no vascular invasion or perineural invasion, negative resection margins,  0/4 lymph nodes Elevated CEA 02/10/2019 CTs 03/05/2019, compared to outside CTs from 11/22/2018-worsened wall thickening in the cecum stable apple core lesion in the mid transverse colon, no evidence of recurrent tumor at the distal colonic anastomosis, no evidence of metastatic disease Colonoscopy 03/24/2019 20-2 malignant appearing masses noted in the colon, 1 filled the cecum measuring approximately 4 cm across and friable.  The other malignant appearing mass was circumferential creating a stricture with a 1.2 cm lumen located in the transverse segment approximately 4 to 5 cm in length.  There were 12-18 neoplastic appearing polyps in between the 2 obvious malignant masses ranging in size from 3 mm to 2 cm.  5 polyps distal to the transverse colon mass.  2 sigmoid colon polyps.  Cecum mass positive for adenocarcinoma.  Transverse colon mass positive for adenocarcinoma. 05/07/2019 laparoscopic extended right hemicolectomy, lysis of adhesions-5 cm invasive moderately differentiated adenocarcinoma involving cecum and proximal ascending colon, carcinoma invades into the pericolonic soft tissue; 7 cm invasive moderately differentiated carcinoma involving the transverse colon, carcinoma invades into the serosal surface (visceral peritoneum); all resection margins negative for carcinoma.  Lymphovascular invasion present.  In the proximal colon metastatic carcinoma involves 3 of 17 lymph nodes with 1 tumor deposit.  In the transverse colon 12 lymph nodes negative for metastatic carcinoma.  3 separate polyps: Tubular adenoma, inflammatory polyp and hyperplastic polyp Cycle 1 capecitabine 06/02/2019 Cycle 2 capecitabine 06/23/2019 Cycle 3 capecitabine 07/14/2019 Cycle 4 capecitabine 08/04/2019 Cycle 5 capecitabine 08/25/2019  Cycle 6 capecitabine 09/15/2019 Cycle 7 capecitabine 10/06/2019 Cycle 8 capecitabine 10/27/2019 Upper endoscopy 07/23/2020-negative Colonoscopy 07/23/2020-normal-appearing anastomoses, polyp removed from  the descending colon-tubular adenoma 2.   Microcytic anemia secondary to #1.    Resolved 3.   Family history of pancreas and colon cancer, negative genetic testing per the [employer] panel, ATM variant of unknown significance     Disposition: John Vance appears well.  The CEA is mildly elevated today.  He will return for an office visit and repeat CEA in 1 month.  If the CEA remains elevated he will be referred for restaging CT scans.  We will also consider obtaining peripheral blood guardant 360 testing.    Betsy Coder, MD  12/02/2020  12:19 PM

## 2020-12-03 LAB — CEA (IN HOUSE-CHCC): CEA (CHCC-In House): 8.65 ng/mL — ABNORMAL HIGH (ref 0.00–5.00)

## 2020-12-08 ENCOUNTER — Telehealth: Payer: Self-pay

## 2020-12-08 NOTE — Telephone Encounter (Signed)
TC to Pt per Dr.Sherrill spoke with Pt's son and Pt was listening on speaker phone about CEA results Pt and son were aware of the elevation and I informed them that lab will be repeated on next visit and upon those results if CEA is still elevated Pt will be scheduled for CT of abdomen and pelvis. Pt and son verbalized understanding. No further problems or concerns noted.

## 2020-12-08 NOTE — Telephone Encounter (Signed)
-----   Message from Ladell Pier, MD sent at 12/03/2020 10:59 AM EDT ----- Please call patient, CEA is mildly elevated in both cancer center labs, repeat in 1 month, if still elevated will schedule CTs of the chest, abdomen, and pelvis, follow-up as scheduled

## 2021-01-03 ENCOUNTER — Inpatient Hospital Stay: Payer: Self-pay | Attending: Nurse Practitioner | Admitting: Nurse Practitioner

## 2021-01-03 ENCOUNTER — Encounter: Payer: Self-pay | Admitting: Nurse Practitioner

## 2021-01-03 ENCOUNTER — Inpatient Hospital Stay: Payer: Self-pay

## 2021-01-03 ENCOUNTER — Other Ambulatory Visit: Payer: Self-pay

## 2021-01-03 VITALS — BP 156/66 | HR 63 | Temp 98.1°F | Resp 18 | Ht 68.0 in | Wt 180.2 lb

## 2021-01-03 DIAGNOSIS — R970 Elevated carcinoembryonic antigen [CEA]: Secondary | ICD-10-CM | POA: Insufficient documentation

## 2021-01-03 DIAGNOSIS — C187 Malignant neoplasm of sigmoid colon: Secondary | ICD-10-CM

## 2021-01-03 DIAGNOSIS — Z9221 Personal history of antineoplastic chemotherapy: Secondary | ICD-10-CM | POA: Insufficient documentation

## 2021-01-03 DIAGNOSIS — Z85038 Personal history of other malignant neoplasm of large intestine: Secondary | ICD-10-CM | POA: Insufficient documentation

## 2021-01-03 LAB — CEA (ACCESS): CEA (CHCC): 14.13 ng/mL — ABNORMAL HIGH (ref 0.00–5.00)

## 2021-01-03 NOTE — Progress Notes (Signed)
[facility] OFFICE PROGRESS NOTE   Diagnosis: Colon cancer  INTERVAL HISTORY:   [name] returns as scheduled.  When he was here on 12/02/2020 the CEA returned elevated at 8.65.  CEA repeated today with result currently pending.  He feels well.  He has a good appetite.  No pain.  Objective:  Vital signs in last 24 hours:  Blood pressure (!) 156/66, pulse 63, temperature 98.1 F (36.7 C), temperature source Oral, resp. rate 18, height _0  (1.727 m), weight 180 lb 3.2 oz (81.7 kg), SpO2 100 %.    HEENT: Neck without mass. Lymphatics: No palpable cervical, supraclavicular, axillary or inguinal lymph nodes. Resp: Lungs clear bilaterally. Cardio: Regular rate and rhythm. GI: Abdomen soft and nontender.  No hepatomegaly.  No mass. Vascular: No leg edema.   Lab Results:  Lab Results  Component Value Date   WBC 9.4 03/03/2020   HGB 14.3 03/03/2020   HCT 42.0 03/03/2020   MCV 90.7 03/03/2020   PLT 181 03/03/2020   NEUTROABS 6.4 03/03/2020    Imaging:  No results found.  Medications: I have reviewed the patient's current medications.  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), transverse colon cancer stage IIB (T4aN0) status post extended right hemicolectomy 05/07/2019 Tumor invasive superficial portion of the muscle ("internal third "), no tumor perforation, no vascular invasion or perineural invasion, negative resection margins, 0/4 lymph nodes Elevated CEA 02/10/2019 CTs 03/05/2019, compared to outside CTs from 11/22/2018-worsened wall thickening in the cecum stable apple core lesion in the mid transverse colon, no evidence of recurrent tumor at the distal colonic anastomosis, no evidence of metastatic disease Colonoscopy 03/24/2019 20-2 malignant appearing masses noted in the colon, 1 filled the cecum measuring approximately 4 cm across and  friable.  The other malignant appearing mass was circumferential creating a stricture with a 1.2 cm lumen located in the transverse segment approximately 4 to 5 cm in length.  There were 12-18 neoplastic appearing polyps in between the 2 obvious malignant masses ranging in size from 3 mm to 2 cm.  5 polyps distal to the transverse colon mass.  2 sigmoid colon polyps.  Cecum mass positive for adenocarcinoma.  Transverse colon mass positive for adenocarcinoma. 05/07/2019 laparoscopic extended right hemicolectomy, lysis of adhesions-5 cm invasive moderately differentiated adenocarcinoma involving cecum and proximal ascending colon, carcinoma invades into the pericolonic soft tissue; 7 cm invasive moderately differentiated carcinoma involving the transverse colon, carcinoma invades into the serosal surface (visceral peritoneum); all resection margins negative for carcinoma.  Lymphovascular invasion present.  In the proximal colon metastatic carcinoma involves 3 of 17 lymph nodes with 1 tumor deposit.  In the transverse colon 12 lymph nodes negative for metastatic carcinoma.  3 separate polyps: Tubular adenoma, inflammatory polyp and hyperplastic polyp Cycle 1 capecitabine 06/02/2019 Cycle 2 capecitabine 06/23/2019 Cycle 3 capecitabine 07/14/2019 Cycle 4 capecitabine 08/04/2019 Cycle 5 capecitabine 08/25/2019 Cycle 6 capecitabine 09/15/2019 Cycle 7 capecitabine 10/06/2019 Cycle 8 capecitabine 10/27/2019 Upper endoscopy 07/23/2020-negative Colonoscopy 07/23/2020-normal-appearing anastomoses, polyp removed from the descending colon-tubular adenoma 2.   Microcytic anemia secondary to #1.    Resolved 3.   Family history of pancreas and colon cancer, negative genetic testing per the [employer] panel, ATM variant of unknown significance    Disposition: [name] appears well.  There is no clinical evidence of recurrent colon cancer.  We will follow-up on the CEA from today with the plan to  refer for CT scans if still elevated.  If  the CEA is back to baseline we will see him for follow-up in 6 months.  Patient seen with Dr. Benay Spice.    Ned Card ANP/GNP-BC   01/03/2021  11:50 AM  This was a shared visit with Ned Card.  [name] appears stable.  The CEA remains elevated.  He will be referred for restaging CTs within the next few weeks.  I was present for greater than 50% of today's visit.  I performed medical decision making.  Julieanne Manson, MD

## 2021-01-04 ENCOUNTER — Other Ambulatory Visit: Payer: Self-pay | Admitting: Nurse Practitioner

## 2021-01-04 DIAGNOSIS — C187 Malignant neoplasm of sigmoid colon: Secondary | ICD-10-CM

## 2021-01-05 ENCOUNTER — Telehealth: Payer: Self-pay | Admitting: *Deleted

## 2021-01-05 LAB — CEA (IN HOUSE-CHCC): CEA (CHCC-In House): 12.1 ng/mL — ABNORMAL HIGH (ref 0.00–5.00)

## 2021-01-05 NOTE — Telephone Encounter (Signed)
Called son with CEA results and need for CT scan and he agrees. Scan at [facility] on 01/18/21 at 1000 w/arrival at 0945. Needs lab at 0900 at [facility]. NPO 4 hours prior to scan and oral contrast at 0800 and 0900. He will p/u contrast at [facility] prior to day of scan.

## 2021-01-18 ENCOUNTER — Encounter (HOSPITAL_BASED_OUTPATIENT_CLINIC_OR_DEPARTMENT_OTHER): Payer: Self-pay

## 2021-01-18 ENCOUNTER — Ambulatory Visit (HOSPITAL_BASED_OUTPATIENT_CLINIC_OR_DEPARTMENT_OTHER)
Admission: RE | Admit: 2021-01-18 | Discharge: 2021-01-18 | Disposition: A | Discharge status: Home / Self Care | Payer: Self-pay | Source: Ambulatory Visit | Attending: Nurse Practitioner | Admitting: Nurse Practitioner

## 2021-01-18 ENCOUNTER — Inpatient Hospital Stay: Payer: Self-pay

## 2021-01-18 ENCOUNTER — Other Ambulatory Visit: Payer: Self-pay

## 2021-01-18 DIAGNOSIS — C187 Malignant neoplasm of sigmoid colon: Secondary | ICD-10-CM

## 2021-01-18 LAB — BASIC METABOLIC PANEL - CANCER CENTER ONLY
Anion gap: 7 (ref 5–15)
BUN: 20 mg/dL (ref 8–23)
CO2: 29 mmol/L (ref 22–32)
Calcium: 9.9 mg/dL (ref 8.9–10.3)
Chloride: 104 mmol/L (ref 98–111)
Creatinine: 0.98 mg/dL (ref 0.61–1.24)
GFR, Estimated: >60 mL/min (ref >60)
Glucose, Bld: 91 mg/dL (ref 70–99)
Potassium: 4.7 mmol/L (ref 3.5–5.1)
Sodium: 140 mmol/L (ref 135–145)

## 2021-01-18 MED ORDER — IOHEXOL 350 MG/ML SOLN
80.0000 mL | Freq: Once | INTRAVENOUS | Status: AC | PRN
  Administered 2021-01-18: 80 mL via INTRAVENOUS

## 2021-01-19 ENCOUNTER — Telehealth: Payer: Self-pay | Admitting: Nurse Practitioner

## 2021-01-19 ENCOUNTER — Other Ambulatory Visit: Payer: Self-pay

## 2021-01-19 ENCOUNTER — Telehealth: Payer: Self-pay | Admitting: *Deleted

## 2021-01-19 DIAGNOSIS — C187 Malignant neoplasm of sigmoid colon: Secondary | ICD-10-CM

## 2021-01-19 NOTE — Progress Notes (Signed)
The proposed treatment discussed in conference is for discussion purpose only and is not a binding recommendation.  The patients have not been physically examined, or presented with their treatment options.  Therefore, final treatment plans cannot be decided.  

## 2021-01-19 NOTE — Telephone Encounter (Signed)
I contacted Mr. Rosary Lively son with the recent CT result.  He had actually reviewed the results on MyChart and was aware of the findings.  He understands the recommendation is for a PET scan.  We will try to get the PET scan as soon as possible.

## 2021-01-19 NOTE — Telephone Encounter (Signed)
Notified son of PET scan appointment and prep and f/u visit move to 12/29/20.

## 2021-01-24 ENCOUNTER — Inpatient Hospital Stay: Payer: Self-pay | Admitting: Oncology

## 2021-01-27 ENCOUNTER — Ambulatory Visit
Admission: RE | Admit: 2021-01-27 | Discharge: 2021-01-27 | Disposition: A | Discharge status: Home / Self Care | Payer: Self-pay | Source: Ambulatory Visit | Attending: Nurse Practitioner | Admitting: Nurse Practitioner

## 2021-01-27 ENCOUNTER — Other Ambulatory Visit: Payer: Self-pay

## 2021-01-27 DIAGNOSIS — N281 Cyst of kidney, acquired: Secondary | ICD-10-CM | POA: Insufficient documentation

## 2021-01-27 DIAGNOSIS — C786 Secondary malignant neoplasm of retroperitoneum and peritoneum: Secondary | ICD-10-CM | POA: Insufficient documentation

## 2021-01-27 DIAGNOSIS — N400 Enlarged prostate without lower urinary tract symptoms: Secondary | ICD-10-CM | POA: Insufficient documentation

## 2021-01-27 DIAGNOSIS — R590 Localized enlarged lymph nodes: Secondary | ICD-10-CM | POA: Insufficient documentation

## 2021-01-27 DIAGNOSIS — C187 Malignant neoplasm of sigmoid colon: Secondary | ICD-10-CM | POA: Insufficient documentation

## 2021-01-27 DIAGNOSIS — R911 Solitary pulmonary nodule: Secondary | ICD-10-CM | POA: Insufficient documentation

## 2021-01-27 LAB — GLUCOSE, CAPILLARY: Glucose-Capillary: 84 mg/dL (ref 70–99)

## 2021-01-27 MED ORDER — FLUDEOXYGLUCOSE F - 18 (FDG) INJECTION
9.3000 | Freq: Once | INTRAVENOUS | Status: AC | PRN
  Administered 2021-01-27: 9.7 via INTRAVENOUS

## 2021-01-28 ENCOUNTER — Inpatient Hospital Stay: Payer: Self-pay | Attending: Oncology | Admitting: Oncology

## 2021-01-28 ENCOUNTER — Encounter: Payer: Self-pay | Admitting: *Deleted

## 2021-01-28 VITALS — BP 156/70 | HR 79 | Temp 98.2°F | Resp 18 | Ht 68.0 in | Wt 179.2 lb

## 2021-01-28 DIAGNOSIS — Z5111 Encounter for antineoplastic chemotherapy: Secondary | ICD-10-CM | POA: Insufficient documentation

## 2021-01-28 DIAGNOSIS — C188 Malignant neoplasm of overlapping sites of colon: Secondary | ICD-10-CM | POA: Insufficient documentation

## 2021-01-28 DIAGNOSIS — D509 Iron deficiency anemia, unspecified: Secondary | ICD-10-CM | POA: Insufficient documentation

## 2021-01-28 DIAGNOSIS — Z808 Family history of malignant neoplasm of other organs or systems: Secondary | ICD-10-CM | POA: Insufficient documentation

## 2021-01-28 DIAGNOSIS — C189 Malignant neoplasm of colon, unspecified: Secondary | ICD-10-CM

## 2021-01-28 DIAGNOSIS — C786 Secondary malignant neoplasm of retroperitoneum and peritoneum: Secondary | ICD-10-CM | POA: Insufficient documentation

## 2021-01-28 NOTE — Progress Notes (Signed)
[facility] OFFICE PROGRESS NOTE   Diagnosis: Colon cancer  INTERVAL HISTORY:   [name] returns as scheduled.  He is here today with his son.  He feels well.  No complaint.  No difficulty with bowel or bladder function.  Objective:  Vital signs in last 24 hours:  Blood pressure (!) 156/70, pulse 79, temperature 98.2 F (36.8 C), temperature source Oral, resp. rate 18, height 5' 8"  (1.727 m), weight 179 lb 3.2 oz (81.3 kg), SpO2 100 %.     Resp: Lungs clear bilaterally Cardio: Regular rate and rhythm GI: No mass, nontender, no hepatosplenomegaly Vascular: No leg edema  Lab Results:  Lab Results  Component Value Date   WBC 9.4 03/03/2020   HGB 14.3 03/03/2020   HCT 42.0 03/03/2020   MCV 90.7 03/03/2020   PLT 181 03/03/2020   NEUTROABS 6.4 03/03/2020    CMP  Lab Results  Component Value Date   NA 140 01/18/2021   K 4.7 01/18/2021   CL 104 01/18/2021   CO2 29 01/18/2021   GLUCOSE 91 01/18/2021   BUN 20 01/18/2021   CREATININE 0.98 01/18/2021   CALCIUM 9.9 01/18/2021   PROT 6.8 12/01/2019   ALBUMIN 3.6 12/01/2019   AST 20 12/01/2019   ALT 16 12/01/2019   ALKPHOS 91 12/01/2019   BILITOT 0.8 12/01/2019   GFRNONAA >60 01/18/2021   GFRAA >60 12/01/2019    Lab Results  Component Value Date   CEA1 12.10 (H) 01/03/2021   CEA 14.13 (H) 01/03/2021     Medications: I have reviewed the patient's current medications.   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), transverse colon cancer stage IIB (T4aN0) status post extended right hemicolectomy 05/07/2019, MSS Tumor invasive superficial portion of the muscle ("internal third "), no tumor perforation, no vascular invasion or perineural invasion, negative resection margins, 0/4 lymph nodes Elevated CEA 02/10/2019 CTs 03/05/2019, compared to outside CTs from 11/22/2018-worsened  wall thickening in the cecum stable apple core lesion in the mid transverse colon, no evidence of recurrent tumor at the distal colonic anastomosis, no evidence of metastatic disease Colonoscopy 03/24/2019 20-2 malignant appearing masses noted in the colon, 1 filled the cecum measuring approximately 4 cm across and friable.  The other malignant appearing mass was circumferential creating a stricture with a 1.2 cm lumen located in the transverse segment approximately 4 to 5 cm in length.  There were 12-18 neoplastic appearing polyps in between the 2 obvious malignant masses ranging in size from 3 mm to 2 cm.  5 polyps distal to the transverse colon mass.  2 sigmoid colon polyps.  Cecum mass positive for adenocarcinoma.  Transverse colon mass positive for adenocarcinoma. 05/07/2019 laparoscopic extended right hemicolectomy, lysis of adhesions-5 cm invasive moderately differentiated adenocarcinoma involving cecum and proximal ascending colon, carcinoma invades into the pericolonic soft tissue; 7 cm invasive moderately differentiated carcinoma involving the transverse colon, carcinoma invades into the serosal surface (visceral peritoneum); all resection margins negative for carcinoma.  Lymphovascular invasion present.  In the proximal colon metastatic carcinoma involves 3 of 17 lymph nodes with 1 tumor deposit.  In the transverse colon 12 lymph nodes negative for metastatic carcinoma.  3 separate polyps: Tubular adenoma, inflammatory polyp and hyperplastic polyp Cycle 1 capecitabine 06/02/2019 Cycle 2 capecitabine 06/23/2019 Cycle 3 capecitabine 07/14/2019 Cycle 4 capecitabine 08/04/2019 Cycle 5 capecitabine 08/25/2019 Cycle 6 capecitabine 09/15/2019 Cycle 7 capecitabine 10/06/2019 Cycle 8  capecitabine 10/27/2019 Upper endoscopy 07/23/2020-negative Colonoscopy 07/23/2020-normal-appearing anastomoses, polyp removed from the descending colon-tubular adenoma CTs 01/18/2021-right abdominal mesenteric mass, mass in the low  anatomic pelvis consistent with metastases 2.   Microcytic anemia secondary to #1.    Resolved 3.   Family history of pancreas and colon cancer, negative genetic testing per the [employer] panel, ATM variant of unknown significance    Disposition: [name] appears stable.  The CEA is elevated.  CTs on 01/18/2021 revealed masses at the right abdominal mesentery and in the lower pelvis consistent with metastatic disease.  He underwent a PET scan yesterday.  We do not have the final report available today, but I reviewed the images with [name] and his son.  The lesions in the abdomen and pelvis are hypermetabolic.  I do not see any additional evidence of metastatic disease.  We discussed treatment options.  He will most likely not be a surgical candidate, but I will consult with Dr. Dema Severin.  I recommend systemic therapy.  The 2021 tumor will be submitted for Foundation 1 testing to see whether he may be a candidate for a biologic therapy or targeted therapy.  My initial recommendation is to proceed with FOLFOX chemotherapy.  We discussed the need for Port-A-Cath placement.  We reviewed potential toxicities associated with the FOLFOX regimen including the chance of nausea/vomiting, mucositis, diarrhea, alopecia, and hematologic toxicity.  We discussed the hyperpigmentation associated with 5-fluorouracil.  We reviewed the allergic reaction and various types of neuropathy seen with oxaliplatin.  He agrees to proceed.  [name] will return for an office visit and further discussion on 02/09/2021.  Betsy Coder, MD  01/28/2021  9:50 AM

## 2021-01-28 NOTE — Progress Notes (Signed)
PATIENT NAVIGATOR PROGRESS NOTE  Name: [facility] [name] Date: 01/28/2021 MRN: [id number]  DOB: 01/03/1941   Reason for visit:  Met with patient and son to educate about PAC placement. Dr Benay Spice contacting Dr Dema Severin for placement and pt will return to clinic on 10/19 to further discuss FOLFOX treatment.   Comments:  Foundation one testing request sent for Right Cecum biopsy    Time spent counseling/coordinating care: 15-30 minutes

## 2021-02-01 ENCOUNTER — Encounter: Payer: Self-pay | Admitting: *Deleted

## 2021-02-01 NOTE — Progress Notes (Signed)
Called patient's son and discussed PAC placement on Monday with Dr Dema Severin and per Dr Benay Spice to let them know the PET scan shows hypermetabolic lesions in the cecal mesentery, pelvis, and inguinal region.  It looks like surgery will not be an option.  Plan to proceed with chemotherapy as we discussed.   F/U with Dr Benay Spice scheduled for 10/19. Verbalized agreement of plan

## 2021-02-02 NOTE — Progress Notes (Signed)
Pt. Needs orders for upcoming procedure.PAT and labs on 02/03/21.

## 2021-02-02 NOTE — Patient Instructions (Signed)
DUE TO COVID-19 ONLY ONE VISITOR IS ALLOWED TO COME WITH YOU AND STAY IN THE WAITING ROOM ONLY DURING PRE OP AND PROCEDURE.   **NO VISITORS ARE ALLOWED IN THE SHORT STAY AREA OR RECOVERY ROOM!!**  IF YOU WILL BE ADMITTED INTO THE HOSPITAL YOU ARE ALLOWED ONLY TWO SUPPORT PEOPLE DURING VISITATION HOURS ONLY (10AM -8PM)   The support person(s) may change daily. The support person(s) must pass our screening, gel in and out, and wear a mask at all times, including in the patient's room. Patients must also wear a mask when staff or their support person are in the room.  No visitors under the age of 43. Any visitor under the age of 80 must be accompanied by an adult.     Your procedure is scheduled on: 02/07/21   Report to [facility] Main Entrance    Report to admitting at : 10:45 AM   Call this number if you have problems the morning of surgery [phone]   Do not eat food :After Midnight.   May have liquids until : 10:30 AM  day of surgery  CLEAR LIQUID DIET  Foods Allowed                                                                     Foods Excluded  Water, Black Coffee and tea, regular and decaf                             liquids that you cannot  Plain Jell-O in any flavor  (No red)                                           see through such as: Fruit ices (not with fruit pulp)                                     milk, soups, orange juice              Iced Popsicles (No red)                                    All solid food                                   Apple juices Sports drinks like Gatorade (No red) Lightly seasoned clear broth or consume(fat free) Sugar,   Sample Menu Breakfast                                Lunch                                     Supper Cranberry juice  Beef broth                            Chicken broth Jell-O                                     Grape juice                           Apple juice Coffee or tea                         Jell-O                                      Popsicle                                                Coffee or tea                        Coffee or tea     Oral Hygiene is also important to reduce your risk of infection.                                    Remember - BRUSH YOUR TEETH THE MORNING OF SURGERY WITH YOUR REGULAR TOOTHPASTE   Do NOT smoke after Midnight   Take these medicines the morning of surgery with A SIP OF WATER: esomeprazole(Nexium)  DO NOT TAKE ANY ORAL DIABETIC MEDICATIONS DAY OF YOUR SURGERY                              You may not have any metal on your body including hair pins, jewelry, and body piercing             Do not wear lotions, powders, perfumes/cologne, or deodorant              Men may shave face and neck.   Do not bring valuables to the hospital. [place].   Contacts, dentures or bridgework may not be worn into surgery.   Bring small overnight bag day of surgery.    Patients discharged on the day of surgery will not be allowed to drive home.   Special Instructions: Bring a copy of your healthcare power of attorney and living will documents         the day of surgery if you haven't scanned them before.              Please read over the following fact sheets you were given: IF YOU HAVE QUESTIONS ABOUT YOUR PRE-OP INSTRUCTIONS PLEASE CALL [phone]   [facility] - Preparing for Surgery Before surgery, you can play an important role.  Because skin is not sterile, your skin needs to be as free of germs as possible.  You can reduce the number of germs on  your skin by washing with CHG (chlorahexidine gluconate) soap before surgery.  CHG is an antiseptic cleaner which kills germs and bonds with the skin to continue killing germs even after washing. Please DO NOT use if you have an allergy to CHG or antibacterial soaps.  If your skin becomes reddened/irritated stop using the CHG and inform your  nurse when you arrive at Short Stay. Do not shave (including legs and underarms) for at least 48 hours prior to the first CHG shower.  You may shave your face/neck. Please follow these instructions carefully:  1.  Shower with CHG Soap the night before surgery and the  morning of Surgery.  2.  If you choose to wash your hair, wash your hair first as usual with your  normal  shampoo.  3.  After you shampoo, rinse your hair and body thoroughly to remove the  shampoo.                           4.  Use CHG as you would any other liquid soap.  You can apply chg directly  to the skin and wash                       Gently with a scrungie or clean washcloth.  5.  Apply the CHG Soap to your body ONLY FROM THE NECK DOWN.   Do not use on face/ open                           Wound or open sores. Avoid contact with eyes, ears mouth and genitals (private parts).                       Wash face,  Genitals (private parts) with your normal soap.             6.  Wash thoroughly, paying special attention to the area where your surgery  will be performed.  7.  Thoroughly rinse your body with warm water from the neck down.  8.  DO NOT shower/wash with your normal soap after using and rinsing off  the CHG Soap.                9.  Pat yourself dry with a clean towel.            10.  Wear clean pajamas.            11.  Place clean sheets on your bed the night of your first shower and do not  sleep with pets. Day of Surgery : Do not apply any lotions/deodorants the morning of surgery.  Please wear clean clothes to the hospital/surgery center.  FAILURE TO FOLLOW THESE INSTRUCTIONS MAY RESULT IN THE CANCELLATION OF YOUR SURGERY PATIENT SIGNATURE_________________________________  NURSE SIGNATURE__________________________________  ________________________________________________________________________

## 2021-02-03 ENCOUNTER — Other Ambulatory Visit: Payer: Self-pay

## 2021-02-03 ENCOUNTER — Encounter (HOSPITAL_COMMUNITY): Payer: Self-pay

## 2021-02-03 ENCOUNTER — Encounter (HOSPITAL_COMMUNITY)
Admission: RE | Admit: 2021-02-03 | Discharge: 2021-02-03 | Disposition: A | Discharge status: Home / Self Care | Payer: Self-pay | Source: Ambulatory Visit | Attending: Surgery | Admitting: Surgery

## 2021-02-03 DIAGNOSIS — Z01812 Encounter for preprocedural laboratory examination: Secondary | ICD-10-CM | POA: Insufficient documentation

## 2021-02-03 HISTORY — DX: Personal history of urinary calculi: Z87.442

## 2021-02-03 LAB — CBC
HCT: 42.9 % (ref 39.0–52.0)
Hemoglobin: 13.6 g/dL (ref 13.0–17.0)
MCH: 26.8 pg (ref 26.0–34.0)
MCHC: 31.7 g/dL (ref 30.0–36.0)
MCV: 84.4 fL (ref 80.0–100.0)
Platelets: 238 10*3/uL (ref 150–400)
RBC: 5.08 MIL/uL (ref 4.22–5.81)
RDW: 14.2 % (ref 11.5–15.5)
WBC: 7.8 10*3/uL (ref 4.0–10.5)
nRBC: 0 % (ref 0.0–0.2)

## 2021-02-04 NOTE — Progress Notes (Addendum)
Spoke with pts son John Vance in regards to pt arriving at [facility] admitting department on Monday 02/07/2021 at 11:30 am for scheduled surgical procedure.

## 2021-02-07 ENCOUNTER — Encounter (HOSPITAL_COMMUNITY): Payer: Self-pay | Admitting: Surgery

## 2021-02-07 ENCOUNTER — Ambulatory Visit (HOSPITAL_COMMUNITY): Payer: Self-pay | Admitting: Certified Registered Nurse Anesthetist

## 2021-02-07 ENCOUNTER — Encounter (HOSPITAL_COMMUNITY)
Admission: RE | Disposition: A | Discharge status: Home / Self Care | Payer: Self-pay | Source: Home / Self Care | Attending: Surgery

## 2021-02-07 ENCOUNTER — Ambulatory Visit (HOSPITAL_COMMUNITY): Payer: Self-pay

## 2021-02-07 ENCOUNTER — Ambulatory Visit (HOSPITAL_COMMUNITY)
Admission: RE | Admit: 2021-02-07 | Discharge: 2021-02-07 | Disposition: A | Discharge status: Home / Self Care | Payer: Self-pay | Attending: Surgery | Admitting: Surgery

## 2021-02-07 ENCOUNTER — Ambulatory Visit: Payer: Self-pay | Admitting: Surgery

## 2021-02-07 ENCOUNTER — Other Ambulatory Visit: Payer: Self-pay

## 2021-02-07 DIAGNOSIS — Z95828 Presence of other vascular implants and grafts: Secondary | ICD-10-CM

## 2021-02-07 DIAGNOSIS — C189 Malignant neoplasm of colon, unspecified: Secondary | ICD-10-CM | POA: Insufficient documentation

## 2021-02-07 DIAGNOSIS — Z452 Encounter for adjustment and management of vascular access device: Secondary | ICD-10-CM | POA: Insufficient documentation

## 2021-02-07 HISTORY — PX: PORTACATH PLACEMENT: SHX2246

## 2021-02-07 SURGERY — INSERTION, TUNNELED CENTRAL VENOUS DEVICE, WITH PORT
Anesthesia: General | Site: Neck

## 2021-02-07 MED ORDER — ACETAMINOPHEN 500 MG PO TABS
ORAL_TABLET | ORAL | Status: AC
  Administered 2021-02-07: 1000 mg via ORAL
  Filled 2021-02-07: qty 2

## 2021-02-07 MED ORDER — HEPARIN SOD (PORK) LOCK FLUSH 100 UNIT/ML IV SOLN
INTRAVENOUS | Status: DC | PRN
  Administered 2021-02-07: 300 [IU]

## 2021-02-07 MED ORDER — CHLORHEXIDINE GLUCONATE CLOTH 2 % EX PADS: 6.0000 | MEDICATED_PAD | Freq: Once | CUTANEOUS | Status: DC

## 2021-02-07 MED ORDER — FENTANYL CITRATE (PF) 100 MCG/2ML IJ SOLN
INTRAMUSCULAR | Status: DC | PRN
  Administered 2021-02-07 (×2): 25 ug via INTRAVENOUS
  Administered 2021-02-07: 50 ug via INTRAVENOUS

## 2021-02-07 MED ORDER — ORAL CARE MOUTH RINSE: 15.0000 mL | Freq: Once | OROMUCOSAL | Status: AC

## 2021-02-07 MED ORDER — FENTANYL CITRATE (PF) 100 MCG/2ML IJ SOLN
INTRAMUSCULAR | Status: AC
  Filled 2021-02-07: qty 2

## 2021-02-07 MED ORDER — CEFAZOLIN SODIUM-DEXTROSE 2-4 GM/100ML-% IV SOLN
INTRAVENOUS | Status: AC
  Filled 2021-02-07: qty 100

## 2021-02-07 MED ORDER — CEFAZOLIN SODIUM-DEXTROSE 2-4 GM/100ML-% IV SOLN
2.0000 g | INTRAVENOUS | Status: AC
  Administered 2021-02-07: 2 g via INTRAVENOUS

## 2021-02-07 MED ORDER — LIDOCAINE-EPINEPHRINE 1 %-1:100000 IJ SOLN
INTRAMUSCULAR | Status: AC
  Filled 2021-02-07: qty 1

## 2021-02-07 MED ORDER — HEPARIN SOD (PORK) LOCK FLUSH 100 UNIT/ML IV SOLN
INTRAVENOUS | Status: AC
  Filled 2021-02-07: qty 5

## 2021-02-07 MED ORDER — EPHEDRINE SULFATE-NACL 50-0.9 MG/10ML-% IV SOSY
PREFILLED_SYRINGE | INTRAVENOUS | Status: DC | PRN
  Administered 2021-02-07: 5 mg via INTRAVENOUS

## 2021-02-07 MED ORDER — LIDOCAINE-EPINEPHRINE 1 %-1:100000 IJ SOLN
INTRAMUSCULAR | Status: DC | PRN
  Administered 2021-02-07: 10 mL

## 2021-02-07 MED ORDER — PROPOFOL 10 MG/ML IV BOLUS
INTRAVENOUS | Status: AC
  Filled 2021-02-07: qty 20

## 2021-02-07 MED ORDER — LACTATED RINGERS IV SOLN: INTRAVENOUS | Status: DC

## 2021-02-07 MED ORDER — DEXAMETHASONE SODIUM PHOSPHATE 4 MG/ML IJ SOLN
INTRAMUSCULAR | Status: DC | PRN
  Administered 2021-02-07: 5 mg via INTRAVENOUS

## 2021-02-07 MED ORDER — TRAMADOL HCL 50 MG PO TABS: 50.0000 mg | ORAL_TABLET | Freq: Four times a day (QID) | ORAL | 0 refills | Status: DC | PRN

## 2021-02-07 MED ORDER — IOHEXOL 300 MG/ML  SOLN
INTRAMUSCULAR | Status: DC | PRN
  Administered 2021-02-07: 10 mL

## 2021-02-07 MED ORDER — ONDANSETRON HCL 4 MG/2ML IJ SOLN
INTRAMUSCULAR | Status: DC | PRN
  Administered 2021-02-07: 4 mg via INTRAVENOUS

## 2021-02-07 MED ORDER — HEPARIN 6000 UNIT IRRIGATION SOLUTION
Freq: Once | Status: DC
  Filled 2021-02-07: qty 500

## 2021-02-07 MED ORDER — CHLORHEXIDINE GLUCONATE 0.12 % MT SOLN
15.0000 mL | Freq: Once | OROMUCOSAL | Status: AC
  Administered 2021-02-07: 15 mL via OROMUCOSAL

## 2021-02-07 MED ORDER — EPHEDRINE 5 MG/ML INJ
INTRAVENOUS | Status: AC
  Filled 2021-02-07: qty 5

## 2021-02-07 MED ORDER — LIDOCAINE 2% (20 MG/ML) 5 ML SYRINGE
INTRAMUSCULAR | Status: DC | PRN
  Administered 2021-02-07: 80 mg via INTRAVENOUS

## 2021-02-07 MED ORDER — ACETAMINOPHEN 500 MG PO TABS: 1000.0000 mg | ORAL_TABLET | ORAL | Status: AC

## 2021-02-07 MED ORDER — HEPARIN 6000 UNIT IRRIGATION SOLUTION
Status: DC | PRN
  Administered 2021-02-07: 1

## 2021-02-07 MED ORDER — CHLORHEXIDINE GLUCONATE CLOTH 2 % EX PADS
6.0000 | MEDICATED_PAD | Freq: Once | CUTANEOUS | Status: AC
Start: 2021-02-07 — End: 2021-02-07
  Administered 2021-02-07: 6 via TOPICAL

## 2021-02-07 MED ORDER — PROPOFOL 10 MG/ML IV BOLUS
INTRAVENOUS | Status: DC | PRN
  Administered 2021-02-07: 120 mg via INTRAVENOUS
  Administered 2021-02-07: 30 mg via INTRAVENOUS

## 2021-02-07 MED ORDER — LIDOCAINE HCL (PF) 2 % IJ SOLN
INTRAMUSCULAR | Status: AC
  Filled 2021-02-07: qty 5

## 2021-02-07 SURGICAL SUPPLY — 47 items
ADH SKN CLS APL DERMABOND .7 (GAUZE/BANDAGES/DRESSINGS) ×1
APL PRP STRL LF DISP 70% ISPRP (MISCELLANEOUS) ×1
APL SKNCLS STERI-STRIP NONHPOA (GAUZE/BANDAGES/DRESSINGS) ×1
BAG COUNTER SPONGE SURGICOUNT (BAG) IMPLANT
BAG DECANTER FOR FLEXI CONT (MISCELLANEOUS) ×2 IMPLANT
BAG SPNG CNTER NS LX DISP (BAG)
BENZOIN TINCTURE PRP APPL 2/3 (GAUZE/BANDAGES/DRESSINGS) ×2 IMPLANT
BLADE HEX COATED 2.75 (ELECTRODE) ×2 IMPLANT
BLADE SURG 15 STRL LF DISP TIS (BLADE) ×1 IMPLANT
BLADE SURG 15 STRL SS (BLADE) ×2
BLADE SURG SZ11 CARB STEEL (BLADE) ×2 IMPLANT
CHLORAPREP W/TINT 26 (MISCELLANEOUS) ×2 IMPLANT
COVER PROBE U/S 5X48 (MISCELLANEOUS) ×2 IMPLANT
DECANTER SPIKE VIAL GLASS SM (MISCELLANEOUS) ×2 IMPLANT
DERMABOND ADVANCED (GAUZE/BANDAGES/DRESSINGS) ×1
DERMABOND ADVANCED .7 DNX12 (GAUZE/BANDAGES/DRESSINGS) ×1 IMPLANT
DRAPE C-ARM 42X120 X-RAY (DRAPES) ×2 IMPLANT
DRAPE LAPAROSCOPIC ABDOMINAL (DRAPES) ×2 IMPLANT
DRSG TEGADERM 2-3/8X2-3/4 SM (GAUZE/BANDAGES/DRESSINGS) ×2 IMPLANT
DRSG TEGADERM 4X4.75 (GAUZE/BANDAGES/DRESSINGS) ×2 IMPLANT
ELECT REM PT RETURN 15FT ADLT (MISCELLANEOUS) ×2 IMPLANT
GAUZE 4X4 16PLY ~~LOC~~+RFID DBL (SPONGE) ×2 IMPLANT
GAUZE SPONGE 4X4 12PLY STRL (GAUZE/BANDAGES/DRESSINGS) ×2 IMPLANT
GLOVE SURG ENC TEXT LTX SZ7.5 (GLOVE) ×2 IMPLANT
GLOVE SURG NEOPR MICRO LF SZ8 (GLOVE) ×2 IMPLANT
GLOVE SURG UNDER LTX SZ8 (GLOVE) ×2 IMPLANT
GOWN STRL REUS W/TWL XL LVL3 (GOWN DISPOSABLE) ×4 IMPLANT
KIT BASIN OR (CUSTOM PROCEDURE TRAY) ×2 IMPLANT
KIT PORT POWER 8FR ISP CVUE (Port) ×2 IMPLANT
KIT TURNOVER KIT A (KITS) ×2 IMPLANT
NEEDLE HYPO 25X1 1.5 SAFETY (NEEDLE) ×2 IMPLANT
NS IRRIG 1000ML POUR BTL (IV SOLUTION) ×2 IMPLANT
PACK BASIC VI WITH GOWN DISP (CUSTOM PROCEDURE TRAY) ×2 IMPLANT
PENCIL SMOKE EVACUATOR (MISCELLANEOUS) IMPLANT
STRIP CLOSURE SKIN 1/2X4 (GAUZE/BANDAGES/DRESSINGS) ×2 IMPLANT
SUT MNCRL AB 4-0 PS2 18 (SUTURE) ×2 IMPLANT
SUT PROLENE 2 0 SH DA (SUTURE) ×2 IMPLANT
SUT VIC AB 2-0 SH 18 (SUTURE) IMPLANT
SUT VIC AB 2-0 SH 27 (SUTURE)
SUT VIC AB 2-0 SH 27X BRD (SUTURE) IMPLANT
SUT VIC AB 3-0 SH 27 (SUTURE) ×2
SUT VIC AB 3-0 SH 27XBRD (SUTURE) ×1 IMPLANT
SYR 10ML LL (SYRINGE) ×2 IMPLANT
SYR 20ML LL LF (SYRINGE) ×2 IMPLANT
SYR CONTROL 10ML LL (SYRINGE) ×2 IMPLANT
TOWEL OR 17X26 10 PK STRL BLUE (TOWEL DISPOSABLE) ×2 IMPLANT
TOWEL OR NON WOVEN STRL DISP B (DISPOSABLE) ×2 IMPLANT

## 2021-02-07 NOTE — Anesthesia Procedure Notes (Signed)
Procedure Name: LMA Insertion Date/Time: 02/07/2021 12:10 PM Performed by: Claudia Desanctis, CRNA Pre-anesthesia Checklist: Emergency Drugs available, Patient identified, Suction available and Patient being monitored Patient Re-evaluated:Patient Re-evaluated prior to induction Oxygen Delivery Method: Circle system utilized Preoxygenation: Pre-oxygenation with 100% oxygen Induction Type: IV induction Ventilation: Mask ventilation without difficulty LMA: LMA inserted LMA Size: 4.0 Number of attempts: 1 Placement Confirmation: positive ETCO2 and breath sounds checked- equal and bilateral Tube secured with: Tape Dental Injury: Teeth and Oropharynx as per pre-operative assessment

## 2021-02-07 NOTE — Anesthesia Postprocedure Evaluation (Signed)
Anesthesia Post Note  Patient: John Vance  Procedure(s) Performed: PORT-A-CATH PLACEMENT RIGHT IJ WITH ULTRASOUND GUIDANCE AND X-RAY (Neck)     Patient location during evaluation: PACU Anesthesia Type: General Level of consciousness: sedated and patient cooperative Pain management: pain level controlled Vital Signs Assessment: post-procedure vital signs reviewed and stable Respiratory status: spontaneous breathing Cardiovascular status: stable Anesthetic complications: no   No notable events documented.  Last Vitals:  Vitals:   02/07/21 1345 02/07/21 1412  BP: (!) 167/81 (!) 167/81  Pulse: 80 78  Resp: 15 17  Temp:  36.6 C  SpO2: 97% 97%    Last Pain:  Vitals:   02/07/21 1412  TempSrc: Oral  PainSc: 0-No pain                 Nolon Nations

## 2021-02-07 NOTE — H&P (Addendum)
CC: Here today for port-a-cath  Requesting provider: Dr. Benay Spice  HPI: [facility] John Vance is an 80 y.o. male who is here for port-a-cath placement. He has a history of sigmoid colon cancer, T2N0, stage I. He reports having had hematochezia and underwent evaluation and was found to have a cancer in his sigmoid colon for which she underwent a segmental colectomy of 12/06/2018.  He recovered well from this.  This was an open procedure.  He subsequently relocated to the [place] to live with his son and recover here.  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  No polyps.  The tumor measured 4 cm.  It invaded into the "superficial portion of the muscularis."  There is no tumor perforation, perineural invasion, nor LVI.  Margins were negative.  There only 4 lymph nodes noted in the specimen.  He had CT scans dating back 11/22/2018 that showed an asymmetric area 6 cm in size lesion in the sigmoid colon.  No liver lesions.  Right inguinal hernia.  He was seen by Dr. Toney Rakes in [place], a medical oncologist.  He was recommended to have adjuvant capecitabine after only having had 4 lymph nodes in the specimen.  He however came and saw Dr. Benay Spice with medical oncology in [place] after arriving in the [place].  He was evaluated for possible adjuvant therapy and underwent routine lab work which demonstrated an elevated CEA.  He therefore underwent CT scans of his chest/abdomne/pelvis.  He was found to have masses in both the transverse colon and cecum.  These on imaging certainly appear highly concerning for colon cancers.  There is no evidence of metastatic disease in the chest/abdomen/pelvis.  Our radiologists were able to review his imaging from [place] and at that time appears to have had masses in both the transverse colon and cecum.  The masses are still present now.  His CEA is elevated at 11.4.  He reports he is otherwise healthy and takes no medications.  His  albumin is 3.6.  He denies any fevers/chills/nausea/vomiting.  He has formed stool daily and denies any significant bleeding or melena.  That said, he has had persistent iron deficiency anemia.  He has a good functional status and is able walk up 2 flights of stairs or any difficulty.  He denies any issues with incontinence to gas, liquid, or solid stool.   He underwent comprehenisve genetics evaluation and his entire genetics panel returned negative for any detectable heritable mutations.   Colonoscopy with Dr. Ardis Hughs 03/24/2019 showed the 2 masses in the colon to be adenocarcinoma - cecum and transverse colon. Tattoo placed distal to transverse colon mass. There were ~18 polyps between the masses. Distal to the transverse colon mass, 5 polyps were removed including a 1.5 cm polyp distal to sigmoid anastomosis. Polyps were cleared distal to transverse colon mass and resulted as tubular adenomas and a hyperplastic polyp  Subsequently underwent laparoscopic extended right hemicolectomy with lysis of adhesions 05/07/2019.  He was found to have an invasive moderately differentiated adenocarcinoma involving the cecum and proximal ascending colon.  Additionally, invasive moderately differentiated carcinoma 7 cm involving the transverse colon.  In the proximal colon, 3 of 17 lymph nodes were involved.  In the transverse colon, 0 of 12 lymph nodes were involved.  He had an uneventful recovery.  He was being followed postoperatively after receiving adjuvant chemotherapy with Dr. Benay Spice.  He had a colonoscopy completed Dr. Ardis Hughs 07/23/2020 demonstrated no intraluminal pathology aside from a  small polyp.  He had a PET scan completed 01/29/2021 after having had a suspicious appearing abdominal CT 01/19/2021.  The PET scan demonstrated irregular hypermetabolic mass and hypermetabolic lymph nodes in the ileocolic mesentery as well as an intensely hypermetabolic mass in the deep peritoneal space posterior to the bladder  consistent with nodular peritoneal metastasis.  2 nodular peritoneal metastases within the right inguinal canal.  Suspicious 4 mm nodule in the left upper lobe.  Stable over recent exams but new since 02/2019.  He was referred to [place] for consideration of Port-A-Cath placement for planned systemic therapy.  With the peritoneal disease and multifocal nature of this, as well as a new left upper lobe finding, was not felt to be resectable to the point of curative intent.  He denies any complaints today.  He feels well.  He denies any changes in his health or health history aside from what is noted above since we saw him last in the office as well.  He denies any blood thinners.  Past Medical History:  Diagnosis Date   Anemia    Blood transfusion without reported diagnosis    Colon cancer (Standard)    History of kidney stones     Past Surgical History:  Procedure Laterality Date   APPENDECTOMY     COLON SURGERY     in [place], Aug 2020   CYST REMOVAL HAND Left    shoulder   LAPAROSCOPIC RIGHT HEMI COLECTOMY Right 05/07/2019   Procedure: LAPAROSCOPIC RIGHT HEMI COLECTOMY;  Surgeon: Ileana Roup, MD;  Location: [facility];  Service: General;  Laterality: Right;   TONSILLECTOMY      Family History  Problem Relation Age of Onset   Breast cancer Mother 68       spread to pancreas   Pancreatic cancer Mother    Colon cancer Paternal Uncle 30   Leukemia Sister 63       acute - died 2 months after diagnosis   Cancer Niece 81       unknown cancer of the nose   Stomach cancer Neg Hx    Esophageal cancer Neg Hx    Rectal cancer Neg Hx     Social:  reports that he has never smoked. He has been exposed to tobacco smoke. He has never used smokeless tobacco. He reports current alcohol use of about 3.0 - 4.0 standard drinks per week. He reports that he does not use drugs.  Allergies: No Known Allergies  Medications: I have reviewed the patient's current medications.  No results found for this  or any previous visit (from the past 48 hour(s)).  No results found.  ROS - all of the below systems have been reviewed with the patient and positives are indicated with bold text General: chills, fever or night sweats Eyes: blurry vision or double vision ENT: epistaxis or sore throat Allergy/Immunology: itchy/watery eyes or nasal congestion Hematologic/Lymphatic: bleeding problems, blood clots or swollen lymph nodes Endocrine: temperature intolerance or unexpected weight changes Breast: new or changing breast lumps or nipple discharge Resp: cough, shortness of breath, or wheezing CV: chest pain or dyspnea on exertion GI: as per HPI GU: dysuria, trouble voiding, or hematuria MSK: joint pain or joint stiffness Neuro: TIA or stroke symptoms Derm: pruritus and skin lesion changes Psych: anxiety and depression  PE Blood pressure (!) 153/70, pulse 73, temperature 98.6 F (37 C), temperature source Oral, resp. rate 16, height 5\' 8"  (1.727 m), weight 79.8 kg, SpO2 99 %. Constitutional: NAD;  conversant; wearing mask Eyes: Moist conjunctiva; no lid lag Lungs: Normal respiratory effort CV: RRR GI: Abd soft NT/ND MSK: Normal range of motion of extremities; no clubbing/cyanosis Psychiatric: Appropriate affect; alert and oriented x3 Lymphatic: No palpable cervical or axillary lymphadenopathy  No results found for this or any previous visit (from the past 48 hour(s)).  No results found.   A/P: [facility] John Vance is an 80 y.o. male with now multifocal recurrent colon cancer here today for port-a-cath placement  -The anatomy and physiology of the neck and cardiovascular system was reviewed with him.  -We have discussed port-a-cath placement including the technical aspects of the procedure -The planned procedure, material risks (including, but not limited to, pain, bleeding, infection, scarring, need for blood transfusion, damage to surrounding structures- blood  vessels/nerves/viscus/organs, hemo/pnuemothorax and possible need for chest tube placement, port malpositioning requiring replacement or PICC line, need for additional procedures, pneumonia, heart attack, stroke, death) benefits and alternatives to surgery were discussed at length. The patient's questions were answered to his satisfaction, he voiced understanding and elected to proceed with surgery. Additionally, we discussed typical postoperative expectations and the recovery process.   -All the above has been completed using medical interpreter services, [place] with ID# written on the written consent  Nadeen Landau, MD [facility] Use [email or web address] to contact on call provider

## 2021-02-07 NOTE — Op Note (Signed)
02/07/2021  1:22 PM  PATIENT:  Pierre Bali  80 y.o. male  Patient Care Team: Ladell Pier, MD as PCP - General (Oncology)  PRE-OPERATIVE DIAGNOSIS:  Colon cancer recurrence, need for systemic therapy  POST-OPERATIVE DIAGNOSIS:  Same  PROCEDURE:   Placement of port-a-cath - right internal jugular vein with fluoroscopy Ultrasound guided access of right internal jugular vein  SURGEON:  Sharon Mt. Dema Severin, MD  ASSISTANT: Broadus John   ANESTHESIA:   local and general  COUNTS:  Sponge, needle and instrument counts were reported correct x2 at the conclusion of the operation.  EBL: 5 mL  DRAINS: None  SPECIMEN: None  COMPLICATIONS: None  FINDINGS: Right IJ port-a-cath placed under ultrasound and fluoroscopic guidance with tip resting at cavoatrial junction. Flushes and aspirates blood freely.  DISPOSITION: PACU in satisfactory condition  DESCRIPTION: The patient was identified in preop holding and taken to the OR where he was placed on the operating room table. SCDs were placed. General anesthesia was induced without difficulty. Hair on the neck had been previously clipped. The chest hair was clipped. He was positioned in modified beach chair position with the head turned slightly to the left. He was then prepped and draped in the usual sterile fashion. A surgical timeout was performed indicating the correct patient, procedure, positioning and need for preoperative antibiotics.   Under ultrasound guidance, cardiac both the right internal jugular vein was accessed on the first attempt with the introducer needle.  The J-wire was advanced.  This was confirmed on ultrasound to be within the lumen of the internal jugular vein on the right.  Fluoroscopy was then utilized demonstrating the J-wire projecting over the right hemithorax.  A skin nick was created using the 11 blade.  The peel-away sheath was then introduced over the wire under fluoroscopic guidance.  The wire was  removed.  The dilator was removed.  The port tubing was advanced.  The sheath was then cracked and carefully peeled away.  Over the right upper chest, the port incision was created.  The port pocket was developed. Local anesthetic was infiltrated at the location of the planned tunneling.  The tunneling device was then passed up to a stab incision in the right neck where the port tubing emanated.   The tubing was then tunneled to the port pocket.  3 cc of 50% diluted Omnipaque/injectable saline was instilled within the catheter tubing to assist in visualization.  Under fluoroscopic guidance, this was then carefully retracted until the tip of the catheter rest at the cavoatrial junction on the right.  The port was brought onto the field.  This was then secured within the port pocket using 2-0 Prolene suture.  The tubing was then connected to the port after being trimmed.  This was connected using the holding attachment piece.  The port was then aspirated and dark red blood is obtained.  This freely flushes.  The port pocket is then closed with 3-0 Vicryl deep dermal suture followed by running 4-0 Monocryl subcuticular suture.  Dermabond was then placed over the skin neck incision on the right neck as well as the port site.  The port was then "locked" using heparinized saline.  All counts have been reported correct.  He was awakened from anesthesia, extubated, and transferred to a stretcher for transport to PACU in satisfactory condition.  A postprocedure chest x-ray has been ordered.

## 2021-02-07 NOTE — Discharge Instructions (Addendum)
POST OP INSTRUCTIONS  DIET: As tolerated. Follow a light bland diet the first 24 hours after arrival home, such as soup, liquids, crackers, etc.  Be sure to include lots of fluids daily.  Avoid fast food or heavy meals as your are more likely to get nauseated.  Eat a low fat the next few days after surgery.  Take your usually prescribed home medications unless otherwise directed.  PAIN CONTROL: Pain is best controlled by a usual combination of three different methods TOGETHER: Ice/Heat Over the counter pain medication Prescription pain medication Most patients will experience some swelling and bruising around the surgical site.  Ice packs or heating pads (30-60 minutes up to 6 times a day) will help. Some people prefer to use ice alone, heat alone, alternating between ice & heat.  Experiment to what works for you.  Swelling and bruising can take several weeks to resolve.   It is helpful to take an over-the-counter pain medication regularly for the first few weeks: Ibuprofen (Motrin/Advil) - 200mg  tabs - take 3 tabs (600mg ) every 6 hours as needed for pain Acetaminophen (Tylenol) - you may take 650mg  every 6 hours as needed. You can take this with motrin as they act differently on the body. If you are taking a narcotic pain medication that has acetaminophen in it, do not take over the counter tylenol at the same time.  Iii. NOTE: You may take both of these medications together - most patients  find it most helpful when alternating between the two (i.e. Ibuprofen at 6am, tylenol at 9am, ibuprofen at 12pm ..Marland Kitchen) A  prescription for pain medication should be given to you upon discharge.  Take your pain medication as prescribed if your pain is not adequatly controlled with the over-the-counter pain reliefs mentioned above.  Avoid getting constipated.  Between the surgery and the pain medications, it is common to experience some constipation.  Increasing fluid intake and taking a fiber supplement (such as  Metamucil, Citrucel, FiberCon, MiraLax, etc) 1-2 times a day regularly will usually help prevent this problem from occurring.  A mild laxative (prune juice, Milk of Magnesia, MiraLax, etc) should be taken according to package directions if there are no bowel movements after 48 hours.    Dressing: Your incision are covered in Dermabond which is like sterile superglue for the skin. This will come off on it's own in a couple weeks. It is waterproof and you may bathe normally starting the day after your surgery in a shower. Avoid baths/pools/lakes/oceans until your wounds have fully healed.  ACTIVITIES as tolerated:   Avoid strenuous activities that require you to turn your neck sharply for the next 2 weeks You may resume regular (light) daily activities beginning the next day--such as daily self-care, walking, climbing stairs--gradually increasing activities as tolerated.  If you can walk 30 minutes without difficulty, it is safe to try more intense activity such as jogging, treadmill, bicycling, low-impact aerobics.  DO NOT PUSH THROUGH PAIN.  Let pain be your guide: If it hurts to do something, don't do it. You may drive when you are no longer taking prescription pain medication, you can comfortably wear a seatbelt, and you can safely maneuver your car and apply brakes.   FOLLOW UP in our office Please call [facility] at [phone] to set up an appointment to see your surgeon in the office for a follow-up appointment approximately 2 weeks after your surgery. Make sure that you call for this appointment the day you arrive home  to insure a convenient appointment time.  9. If you have disability or family leave forms that need to be completed, you may have them completed by your primary care physician's office; for return to work instructions, please ask our office staff and they will be happy to assist you in obtaining this documentation   When to call us [phone]: Poor pain control Reactions /  problems with new medications (rash/itching, etc)  Fever over 101.5 F (38.5 C) Inability to urinate Nausea/vomiting Worsening swelling or bruising Continued bleeding from incision. Increased pain, redness, or drainage from the incision  The clinic staff is available to answer your questions during regular business hours (8:30am-5pm).  Please don't hesitate to call and ask to speak to one of our nurses for clinical concerns.   A surgeon from [facility] is always on call at the hospitals   If you have a medical emergency, go to the nearest emergency room or call 911.  [facility] [address] [place], [place], [place]  [address] MAIN: [phone] FAX: [phone] [email or web address]

## 2021-02-07 NOTE — Anesthesia Procedure Notes (Signed)
Procedure Name: LMA Insertion Date/Time: 02/07/2021 12:00 PM Performed by: Claudia Desanctis, CRNA Pre-anesthesia Checklist: Emergency Drugs available, Patient identified, Suction available and Patient being monitored Patient Re-evaluated:Patient Re-evaluated prior to induction Oxygen Delivery Method: Circle system utilized Preoxygenation: Pre-oxygenation with 100% oxygen Induction Type: IV induction Ventilation: Mask ventilation without difficulty LMA: LMA inserted LMA Size: 4.0 Number of attempts: 1 Placement Confirmation: positive ETCO2 and breath sounds checked- equal and bilateral Tube secured with: Tape Dental Injury: Teeth and Oropharynx as per pre-operative assessment

## 2021-02-07 NOTE — Anesthesia Preprocedure Evaluation (Addendum)
Anesthesia Evaluation  Patient identified by MRN, date of birth, ID band Patient awake    Reviewed: Allergy & Precautions, NPO status , Patient's Chart, lab work & pertinent test results  Airway Mallampati: II  TM Distance: >3 FB Neck ROM: Full    Dental  (+) Dental Advisory Given, Missing, Poor Dentition   Pulmonary neg pulmonary ROS,    Pulmonary exam normal breath sounds clear to auscultation       Cardiovascular negative cardio ROS Normal cardiovascular exam Rhythm:Regular Rate:Normal     Neuro/Psych negative neurological ROS     GI/Hepatic Neg liver ROS, Colon CA   Endo/Other  negative endocrine ROS  Renal/GU negative Renal ROS     Musculoskeletal   Abdominal   Peds  Hematology  (+) anemia ,   Anesthesia Other Findings   Reproductive/Obstetrics                            Lab Results  Component Value Date   WBC 7.8 02/03/2021   HGB 13.6 02/03/2021   HCT 42.9 02/03/2021   MCV 84.4 02/03/2021   PLT 238 02/03/2021   Lab Results  Component Value Date   CREATININE 0.98 01/18/2021   BUN 20 01/18/2021   NA 140 01/18/2021   K 4.7 01/18/2021   CL 104 01/18/2021   CO2 29 01/18/2021    Anesthesia Physical  Anesthesia Plan  ASA: 3  Anesthesia Plan: General   Post-op Pain Management:    Induction: Intravenous  PONV Risk Score and Plan: 2 and Dexamethasone, Ondansetron and Treatment may vary due to age or medical condition  Airway Management Planned: LMA  Additional Equipment: None  Intra-op Plan:   Post-operative Plan: Extubation in OR  Informed Consent: I have reviewed the patients History and Physical, chart, labs and discussed the procedure including the risks, benefits and alternatives for the proposed anesthesia with the patient or authorized representative who has indicated his/her understanding and acceptance.     Dental advisory given  Plan Discussed with:  CRNA  Anesthesia Plan Comments:        Anesthesia Quick Evaluation

## 2021-02-07 NOTE — Transfer of Care (Signed)
Immediate Anesthesia Transfer of Care Note  Patient: [place]  Procedure(s) Performed: PORT-A-CATH PLACEMENT RIGHT IJ WITH ULTRASOUND GUIDANCE AND X-RAY (Neck)  Patient Location: PACU  Anesthesia Type:General  Level of Consciousness: awake and patient cooperative  Airway & Oxygen Therapy: Patient Spontanous Breathing and Patient connected to face mask  Post-op Assessment: Report given to RN and Post -op Vital signs reviewed and stable  Post vital signs: Reviewed and stable  Last Vitals:  Vitals Value Taken Time  BP    Temp    Pulse 88 02/07/21 1315  Resp 12 02/07/21 1315  SpO2 100 % 02/07/21 1315  Vitals shown include unvalidated device data.  Last Pain:  Vitals:   02/07/21 1102  TempSrc:   PainSc: 0-No pain         Complications: No notable events documented.

## 2021-02-08 ENCOUNTER — Encounter (HOSPITAL_COMMUNITY): Payer: Self-pay | Admitting: Surgery

## 2021-02-09 ENCOUNTER — Other Ambulatory Visit: Payer: Self-pay

## 2021-02-09 ENCOUNTER — Inpatient Hospital Stay (HOSPITAL_BASED_OUTPATIENT_CLINIC_OR_DEPARTMENT_OTHER): Payer: Self-pay | Admitting: Oncology

## 2021-02-09 VITALS — BP 168/66 | HR 74 | Temp 98.2°F | Resp 18 | Ht 68.0 in | Wt 181.2 lb

## 2021-02-09 DIAGNOSIS — C187 Malignant neoplasm of sigmoid colon: Secondary | ICD-10-CM

## 2021-02-09 DIAGNOSIS — Z7189 Other specified counseling: Secondary | ICD-10-CM | POA: Insufficient documentation

## 2021-02-09 MED ORDER — LIDOCAINE-PRILOCAINE 2.5-2.5 % EX CREA: 1.0000 "application " | TOPICAL_CREAM | CUTANEOUS | 0 refills | Status: DC | PRN

## 2021-02-09 MED ORDER — PROCHLORPERAZINE MALEATE 10 MG PO TABS: 10.0000 mg | ORAL_TABLET | Freq: Four times a day (QID) | ORAL | 0 refills | Status: DC | PRN

## 2021-02-09 NOTE — Progress Notes (Signed)
START ON PATHWAY REGIMEN - Colorectal     A cycle is every 14 days:     Oxaliplatin      Leucovorin      Fluorouracil      Fluorouracil   **Always confirm dose/schedule in your pharmacy ordering system**  Patient Characteristics: Distant Metastases, Nonsurgical Candidate, KRAS/NRAS Mutation Positive/Unknown (BRAF V600 Wild-Type/Unknown), Standard Cytotoxic Therapy, First Line Standard Cytotoxic Therapy, Bevacizumab Ineligible, PS = 0,1 Tumor Location: Colon Therapeutic Status: Distant Metastases Microsatellite/Mismatch Repair Status: MSS/pMMR BRAF Mutation Status: Awaiting Test Results KRAS/NRAS Mutation Status: Awaiting Test Results Standard Cytotoxic Line of Therapy: First Line Standard Cytotoxic Therapy ECOG Performance Status: 0 Bevacizumab Eligibility: Ineligible Intent of Therapy: Non-Curative / Palliative Intent, Discussed with Patient 

## 2021-02-09 NOTE — Progress Notes (Signed)
John Vance   Diagnosis: Colon cancer  INTERVAL HISTORY:   John Vance returns as scheduled.  He feels well.  No complaint.  He underwent Port-A-Cath placement by Dr. Dema Severin on 02/07/2021.  He has seen a dentist and is being scheduled for multiple tooth extractions.  Objective:  Vital signs in last 24 hours:  Blood pressure (!) 168/66, pulse 74, temperature 98.2 F (36.8 C), temperature source Oral, resp. rate 18, height 5' 8"  (1.727 m), weight 181 lb 3.2 oz (82.2 kg), SpO2 98 %.    Resp: Lungs clear bilaterally Cardio: Regular rate and rhythm GI: No mass, nontender, no hepatomegaly, no mass at the right inguinal region Vascular: No leg edema  Portacath/PICC-without erythema  Lab Results:  Lab Results  Component Value Date   WBC 7.8 02/03/2021   HGB 13.6 02/03/2021   HCT 42.9 02/03/2021   MCV 84.4 02/03/2021   PLT 238 02/03/2021   NEUTROABS 6.4 03/03/2020    CMP  Lab Results  Component Value Date   NA 140 01/18/2021   K 4.7 01/18/2021   CL 104 01/18/2021   CO2 29 01/18/2021   GLUCOSE 91 01/18/2021   BUN 20 01/18/2021   CREATININE 0.98 01/18/2021   CALCIUM 9.9 01/18/2021   PROT 6.8 12/01/2019   ALBUMIN 3.6 12/01/2019   AST 20 12/01/2019   ALT 16 12/01/2019   ALKPHOS 91 12/01/2019   BILITOT 0.8 12/01/2019   GFRNONAA >60 01/18/2021   GFRAA >60 12/01/2019    Lab Results  Component Value Date   CEA1 12.10 (H) 01/03/2021   CEA 14.13 (H) 01/03/2021    Imaging:  DG CHEST PORT 1 VIEW  Result Date: 02/07/2021 CLINICAL DATA:  Status post port placement. EXAM: PORTABLE CHEST 1 VIEW COMPARISON:  Chest x-ray dated August 13, 2020 FINDINGS: Interval placement of right chest wall port with tip positioned over the expected area of the right atrium. Cardiac and mediastinal contours are unchanged. Low lung volumes hypoventilatory changes. No focal opacity. No large pleural effusion. No evidence of pneumothorax, although  evaluation the lung apices is somewhat limited due to head position. IMPRESSION: Interval placement of right chest wall port with tip positioned over the expected area of the right atrium. Electronically Signed   By: Yetta Glassman M.D.   On: 02/07/2021 13:59   DG C-Arm 1-60 Min  Result Date: 02/07/2021 CLINICAL DATA:  Pac placement in OR. EXAM: DG C-ARM 1-60 MIN CONTRAST:  None FLUOROSCOPY TIME:  Fluoroscopy Time:  54 seconds Radiation Exposure Index (if provided by the fluoroscopic device): Not provided Number of Acquired Spot Images: 2 COMPARISON:  None. FINDINGS: Catheter is seen in the right chest with distal tip projecting over the right mediastinum. IMPRESSION: Fluoroscopy for catheter placement. Electronically Signed   By: Ronney Asters M.D.   On: 02/07/2021 15:37    Medications: I have reviewed the patient's current medications.   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), transverse colon cancer stage IIB (T4aN0) status post extended right hemicolectomy 05/07/2019, MSS Tumor invasive superficial portion of the muscle ("internal third "), no tumor perforation, no vascular invasion or perineural invasion, negative resection margins, 0/4 lymph nodes Elevated CEA 02/10/2019 CTs 03/05/2019, compared to outside CTs from 11/22/2018-worsened wall thickening in the cecum stable apple core lesion in the mid transverse colon, no evidence of recurrent tumor at the distal colonic anastomosis, no evidence of metastatic  disease Colonoscopy 03/24/2019 20-2 malignant appearing masses noted in the colon, 1 filled the cecum measuring approximately 4 cm across and friable.  The other malignant appearing mass was circumferential creating a stricture with a 1.2 cm lumen located in the transverse segment approximately 4 to 5 cm in length.  There were 12-18 neoplastic appearing polyps in between the 2  obvious malignant masses ranging in size from 3 mm to 2 cm.  5 polyps distal to the transverse colon mass.  2 sigmoid colon polyps.  Cecum mass positive for adenocarcinoma.  Transverse colon mass positive for adenocarcinoma. 05/07/2019 laparoscopic extended right hemicolectomy, lysis of adhesions-5 cm invasive moderately differentiated adenocarcinoma involving cecum and proximal ascending colon, carcinoma invades into the pericolonic soft tissue; 7 cm invasive moderately differentiated carcinoma involving the transverse colon, carcinoma invades into the serosal surface (visceral peritoneum); all resection margins negative for carcinoma.  Lymphovascular invasion present.  In the proximal colon metastatic carcinoma involves 3 of 17 lymph nodes with 1 tumor deposit.  In the transverse colon 12 lymph nodes negative for metastatic carcinoma.  3 separate polyps: Tubular adenoma, inflammatory polyp and hyperplastic polyp Cycle 1 capecitabine 06/02/2019 Cycle 2 capecitabine 06/23/2019 Cycle 3 capecitabine 07/14/2019 Cycle 4 capecitabine 08/04/2019 Cycle 5 capecitabine 08/25/2019 Cycle 6 capecitabine 09/15/2019 Cycle 7 capecitabine 10/06/2019 Cycle 8 capecitabine 10/27/2019 Upper endoscopy 07/23/2020-negative Colonoscopy 07/23/2020-normal-appearing anastomoses, polyp removed from the descending colon-tubular adenoma CTs 01/18/2021-right abdominal mesenteric mass, mass in the low anatomic pelvis consistent with metastases PET 01/27/2021-hypermetabolic mass in [place] enlarged lymph nodes in the ileocecal mesentery, hypermetabolic mass in the deep peritoneal space posterior to the bladder, 2 nodular peritoneal metastases within the right inguinal canal, suspicious for millimeter left upper lobe nodule, stable from recent exam but new from November 2020 2.   Microcytic anemia secondary to #1.    Resolved 3.   Family history of pancreas and colon cancer, negative genetic testing per the [facility] panel, ATM variant of unknown  significance 4.   Port-A-Cath placement 02/07/2021      Disposition: John Vance has metastatic colon cancer.  The PET scan is consistent with multiple areas of metastatic disease.  He is not a surgical candidate.  We discussed FOLFOX chemotherapy when he was here and again today.  He agrees to proceed with FOLFOX chemotherapy.  He plans to undergo multiple tooth extractions during the week of 02/14/2021.  He will be scheduled for an office visit and cycle 1 FOLFOX on 02/21/2021.  I reviewed the PET images with John Vance and his son today.    Betsy Coder, MD  02/09/2021  3:31 PM

## 2021-02-10 ENCOUNTER — Encounter: Payer: Self-pay | Admitting: *Deleted

## 2021-02-10 ENCOUNTER — Encounter: Payer: Self-pay | Admitting: Nurse Practitioner

## 2021-02-10 NOTE — Progress Notes (Signed)
Faxed clearance letter for dental procedure to [facility] [phone]

## 2021-02-18 NOTE — Progress Notes (Signed)
Pharmacist Chemotherapy Monitoring - Initial Assessment    Anticipated start date: 02/21/21   The following has been reviewed per standard work regarding the patient's treatment regimen: The patient's diagnosis, treatment plan and drug doses, and organ/hematologic function Lab orders and baseline tests specific to treatment regimen  The treatment plan start date, drug sequencing, and pre-medications Prior authorization status  Patient's documented medication list, including drug-drug interaction screen and prescriptions for anti-emetics and supportive care specific to the treatment regimen The drug concentrations, fluid compatibility, administration routes, and timing of the medications to be used The patient's access for treatment and lifetime cumulative dose history, if applicable  The patient's medication allergies and previous infusion related reactions, if applicable   Changes made to treatment plan:  N/A  Follow up needed:  N/A   Patrica Duel, [facility], 02/18/2021  1:49 PM

## 2021-02-20 ENCOUNTER — Other Ambulatory Visit: Payer: Self-pay | Admitting: Oncology

## 2021-02-21 ENCOUNTER — Inpatient Hospital Stay: Payer: Self-pay

## 2021-02-21 ENCOUNTER — Other Ambulatory Visit: Payer: Self-pay

## 2021-02-21 ENCOUNTER — Encounter: Payer: Self-pay | Admitting: Nurse Practitioner

## 2021-02-21 ENCOUNTER — Inpatient Hospital Stay (HOSPITAL_BASED_OUTPATIENT_CLINIC_OR_DEPARTMENT_OTHER): Payer: Self-pay | Admitting: Nurse Practitioner

## 2021-02-21 VITALS — BP 140/63 | HR 76 | Temp 97.8°F | Resp 18 | Ht 68.0 in | Wt 180.2 lb

## 2021-02-21 DIAGNOSIS — C189 Malignant neoplasm of colon, unspecified: Secondary | ICD-10-CM

## 2021-02-21 DIAGNOSIS — C187 Malignant neoplasm of sigmoid colon: Secondary | ICD-10-CM

## 2021-02-21 LAB — CEA (ACCESS): CEA (CHCC): 24.37 ng/mL — ABNORMAL HIGH (ref 0.00–5.00)

## 2021-02-21 LAB — CMP (CANCER CENTER ONLY)
ALT: 9 U/L (ref 0–44)
AST: 16 U/L (ref 15–41)
Albumin: 3.7 g/dL (ref 3.5–5.0)
Alkaline Phosphatase: 55 U/L (ref 38–126)
Anion gap: 7 (ref 5–15)
BUN: 16 mg/dL (ref 8–23)
CO2: 26 mmol/L (ref 22–32)
Calcium: 8.8 mg/dL — ABNORMAL LOW (ref 8.9–10.3)
Chloride: 105 mmol/L (ref 98–111)
Creatinine: 0.91 mg/dL (ref 0.61–1.24)
GFR, Estimated: >60 mL/min (ref >60)
Glucose, Bld: 125 mg/dL — ABNORMAL HIGH (ref 70–99)
Potassium: 3.9 mmol/L (ref 3.5–5.1)
Sodium: 138 mmol/L (ref 135–145)
Total Bilirubin: 0.4 mg/dL (ref 0.3–1.2)
Total Protein: 7.1 g/dL (ref 6.5–8.1)

## 2021-02-21 LAB — CBC WITH DIFFERENTIAL (CANCER CENTER ONLY)
Abs Immature Granulocytes: 0.02 10*3/uL (ref 0.00–0.07)
Basophils Absolute: 0 10*3/uL (ref 0.0–0.1)
Basophils Relative: 1 %
Eosinophils Absolute: 0.3 10*3/uL (ref 0.0–0.5)
Eosinophils Relative: 5 %
HCT: 39.8 % (ref 39.0–52.0)
Hemoglobin: 12.7 g/dL — ABNORMAL LOW (ref 13.0–17.0)
Immature Granulocytes: 0 %
Lymphocytes Relative: 17 %
Lymphs Abs: 1 10*3/uL (ref 0.7–4.0)
MCH: 26.6 pg (ref 26.0–34.0)
MCHC: 31.9 g/dL (ref 30.0–36.0)
MCV: 83.3 fL (ref 80.0–100.0)
Monocytes Absolute: 0.7 10*3/uL (ref 0.1–1.0)
Monocytes Relative: 13 %
Neutro Abs: 3.7 10*3/uL (ref 1.7–7.7)
Neutrophils Relative %: 64 %
Platelet Count: 172 10*3/uL (ref 150–400)
RBC: 4.78 MIL/uL (ref 4.22–5.81)
RDW: 14.4 % (ref 11.5–15.5)
WBC Count: 5.8 10*3/uL (ref 4.0–10.5)
nRBC: 0 % (ref 0.0–0.2)

## 2021-02-21 MED ORDER — FLUOROURACIL CHEMO INJECTION 5 GM/100ML
5000.0000 mg | INTRAVENOUS | Status: DC
  Administered 2021-02-21: 5000 mg via INTRAVENOUS
  Filled 2021-02-21: qty 100

## 2021-02-21 MED ORDER — FLUOROURACIL CHEMO INJECTION 2.5 GM/50ML
400.0000 mg/m2 | Freq: Once | INTRAVENOUS | Status: AC
  Administered 2021-02-21: 800 mg via INTRAVENOUS
  Filled 2021-02-21: qty 16

## 2021-02-21 MED ORDER — LEUCOVORIN CALCIUM INJECTION 350 MG
400.0000 mg/m2 | Freq: Once | INTRAVENOUS | Status: AC
  Administered 2021-02-21: 796 mg via INTRAVENOUS
  Filled 2021-02-21: qty 39.8

## 2021-02-21 MED ORDER — DEXTROSE 5 % IV SOLN: Freq: Once | INTRAVENOUS | Status: AC

## 2021-02-21 MED ORDER — OXALIPLATIN CHEMO INJECTION 100 MG/20ML
85.0000 mg/m2 | Freq: Once | INTRAVENOUS | Status: AC
  Administered 2021-02-21: 170 mg via INTRAVENOUS
  Filled 2021-02-21: qty 34

## 2021-02-21 MED ORDER — SODIUM CHLORIDE 0.9 % IV SOLN
10.0000 mg | Freq: Once | INTRAVENOUS | Status: AC
  Administered 2021-02-21: 10 mg via INTRAVENOUS
  Filled 2021-02-21: qty 1

## 2021-02-21 MED ORDER — PALONOSETRON HCL INJECTION 0.25 MG/5ML
0.2500 mg | Freq: Once | INTRAVENOUS | Status: AC
  Administered 2021-02-21: 0.25 mg via INTRAVENOUS
  Filled 2021-02-21: qty 5

## 2021-02-21 NOTE — Progress Notes (Signed)
Patient presents for treatment. RN assessment completed along with the following:  Labs/vitals reviewed - Yes, and within treatment parameters.   Weight within 10% of previous measurement - Yes Oncology Treatment Attestation completed for current therapy- Yes, on date 02/09/21 Informed consent completed and reflects current therapy/intent - Yes, on date 02/21/21             Provider progress note reviewed - Yes, today's provider note was reviewed. Treatment/Antibody/Supportive plan reviewed - Yes, and there are no adjustments needed for today's treatment. S&H and other orders reviewed - Yes, and there are no additional orders identified. Previous treatment date reviewed - Yes, and the appropriate amount of time has elapsed between treatments. Clinic Hand Off Received from - Ned Card, NP  Patient to proceed with treatment.

## 2021-02-21 NOTE — Patient Instructions (Addendum)
The chemotherapy medication bag should finish at 46 hours, 96 hours, or 7 days. For example, if your pump is scheduled for 46 hours and it was put on at 4:00 p.m., it should finish at 2:00 p.m. the day it is scheduled to come off regardless of your appointment time.     Estimated time to finish at Wednesday, November  2 at 1130    If the display on your pump reads "Low Volume" and it is beeping, take the batteries out of the pump and come to the cancer center for it to be taken off.   If the pump alarms go off prior to the pump reading "Low Volume" then call [phone] and someone can assist you.  If the plunger comes out and the chemotherapy medication is leaking out, please use your home chemo spill kit to clean up the spill. Do NOT use paper towels or other household products.  If you have problems or questions regarding your pump, please call either [phone] (24 hours a day) or the cancer center Monday-Friday 8:00 a.m.- 4:30 p.m. at the clinic number and we will assist you. If you are unable to get assistance, then go to the nearest Emergency Department and ask the staff to contact the IV team for assistance.    [facility]  Discharge Instructions: Thank you for choosing [facility] to provide your oncology and hematology care.   If you have a lab appointment with the [facility], please go directly to the [facility] and check in at the registration area.   Wear comfortable clothing and clothing appropriate for easy access to any Portacath or PICC line.   We strive to give you quality time with your provider. You may need to reschedule your appointment if you arrive late (15 or more minutes).  Arriving late affects you and other patients whose appointments are after yours.  Also, if you miss three or more appointments without notifying the office, you may be dismissed from the clinic at the provider's discretion.      For  prescription refill requests, have your pharmacy contact our office and allow 72 hours for refills to be completed.    Today you received the following chemotherapy and/or immunotherapy agents Oxaliplatin, leucovorin, fluorouracil      To help prevent nausea and vomiting after your treatment, we encourage you to take your nausea medication as directed.  BELOW ARE SYMPTOMS THAT SHOULD BE REPORTED IMMEDIATELY: *FEVER GREATER THAN 100.4 F (38 C) OR HIGHER *CHILLS OR SWEATING *NAUSEA AND VOMITING THAT IS NOT CONTROLLED WITH YOUR NAUSEA MEDICATION *UNUSUAL SHORTNESS OF BREATH *UNUSUAL BRUISING OR BLEEDING *URINARY PROBLEMS (pain or burning when urinating, or frequent urination) *BOWEL PROBLEMS (unusual diarrhea, constipation, pain near the anus) TENDERNESS IN MOUTH AND THROAT WITH OR WITHOUT PRESENCE OF ULCERS (sore throat, sores in mouth, or a toothache) UNUSUAL RASH, SWELLING OR PAIN  UNUSUAL VAGINAL DISCHARGE OR ITCHING   Items with * indicate a potential emergency and should be followed up as soon as possible or go to the Emergency Department if any problems should occur.  Please show the CHEMOTHERAPY ALERT CARD or IMMUNOTHERAPY ALERT CARD at check-in to the Emergency Department and triage nurse.  Should you have questions after your visit or need to cancel or reschedule your appointment, please contact [facility]  Dept: [phone]  and follow the prompts.  Office hours are 8:00 a.m. to 4:30 p.m. Monday - Friday. Please  note that voicemails left after 4:00 p.m. may not be returned until the following business day.  We are closed weekends and major holidays. You have access to a nurse at all times for urgent questions. Please call the main number to the clinic Dept: [phone] and follow the prompts.   For any non-urgent questions, you may also contact your provider using MyChart. We now offer e-Visits for anyone 28 and older to request care online for  non-urgent symptoms. For details visit [email or web address].   Also download the MyChart app! Go to the app store, search "MyChart", open the app, select [facility], and log in with your MyChart username and password.  Due to Covid, a mask is required upon entering the hospital/clinic. If you do not have a mask, one will be given to you upon arrival. For doctor visits, patients may have 1 support person aged 19 or older with them. For treatment visits, patients cannot have anyone with them due to current Covid guidelines and our immunocompromised population.   Oxaliplatin Injection What is this medication? OXALIPLATIN (ox AL i PLA tin) is a chemotherapy drug. It targets fast dividing cells, like cancer cells, and causes these cells to die. This medicine is used to treat cancers of the colon and rectum, and many other cancers. This medicine may be used for other purposes; ask your health care provider or pharmacist if you have questions. COMMON BRAND NAME(S): Eloxatin What should I tell my care team before I take this medication? They need to know if you have any of these conditions: heart disease history of irregular heartbeat liver disease low blood counts, like white cells, platelets, or red blood cells lung or breathing disease, like asthma take medicines that treat or prevent blood clots tingling of the fingers or toes, or other nerve disorder an unusual or allergic reaction to oxaliplatin, other chemotherapy, other medicines, foods, dyes, or preservatives pregnant or trying to get pregnant breast-feeding How should I use this medication? This drug is given as an infusion into a vein. It is administered in a hospital or clinic by a specially trained health care professional. Talk to your pediatrician regarding the use of this medicine in children. Special care may be needed. Overdosage: If you think you have taken too much of this medicine contact a poison control center or emergency  room at once. NOTE: This medicine is only for you. Do not share this medicine with others. What if I miss a dose? It is important not to miss a dose. Call your doctor or health care professional if you are unable to keep an appointment. What may interact with this medication? Do not take this medicine with any of the following medications: cisapride dronedarone pimozide thioridazine This medicine may also interact with the following medications: aspirin and aspirin-like medicines certain medicines that treat or prevent blood clots like warfarin, apixaban, dabigatran, and rivaroxaban cisplatin cyclosporine diuretics medicines for infection like acyclovir, adefovir, amphotericin B, bacitracin, cidofovir, foscarnet, ganciclovir, gentamicin, pentamidine, vancomycin NSAIDs, medicines for pain and inflammation, like ibuprofen or naproxen other medicines that prolong the QT interval (an abnormal heart rhythm) pamidronate zoledronic acid This list may not describe all possible interactions. Give your health care provider a list of all the medicines, herbs, non-prescription drugs, or dietary supplements you use. Also tell them if you smoke, drink alcohol, or use illegal drugs. Some items may interact with your medicine. What should I watch for while using this medication? Your condition will be monitored carefully while you  are receiving this medicine. You may need blood work done while you are taking this medicine. This medicine may make you feel generally unwell. This is not uncommon as chemotherapy can affect healthy cells as well as cancer cells. Report any side effects. Continue your course of treatment even though you feel ill unless your healthcare professional tells you to stop. This medicine can make you more sensitive to cold. Do not drink cold drinks or use ice. Cover exposed skin before coming in contact with cold temperatures or cold objects. When out in cold weather wear warm clothing  and cover your mouth and nose to warm the air that goes into your lungs. Tell your doctor if you get sensitive to the cold. Do not become pregnant while taking this medicine or for 9 months after stopping it. Women should inform their health care professional if they wish to become pregnant or think they might be pregnant. Men should not father a child while taking this medicine and for 6 months after stopping it. There is potential for serious side effects to an unborn child. Talk to your health care professional for more information. Do not breast-feed a child while taking this medicine or for 3 months after stopping it. This medicine has caused ovarian failure in some women. This medicine may make it more difficult to get pregnant. Talk to your health care professional if you are concerned about your fertility. This medicine has caused decreased sperm counts in some men. This may make it more difficult to father a child. Talk to your health care professional if you are concerned about your fertility. This medicine may increase your risk of getting an infection. Call your health care professional for advice if you get a fever, chills, or sore throat, or other symptoms of a cold or flu. Do not treat yourself. Try to avoid being around people who are sick. Avoid taking medicines that contain aspirin, acetaminophen, ibuprofen, naproxen, or ketoprofen unless instructed by your health care professional. These medicines may hide a fever. Be careful brushing or flossing your teeth or using a toothpick because you may get an infection or bleed more easily. If you have any dental work done, tell your dentist you are receiving this medicine. What side effects may I notice from receiving this medication? Side effects that you should report to your doctor or health care professional as soon as possible: allergic reactions like skin rash, itching or hives, swelling of the face, lips, or tongue breathing  problems cough low blood counts - this medicine may decrease the number of white blood cells, red blood cells, and platelets. You may be at increased risk for infections and bleeding nausea, vomiting pain, redness, or irritation at site where injected pain, tingling, numbness in the hands or feet signs and symptoms of bleeding such as bloody or black, tarry stools; red or dark brown urine; spitting up blood or brown material that looks like coffee grounds; red spots on the skin; unusual bruising or bleeding from the eyes, gums, or nose signs and symptoms of a dangerous change in heartbeat or heart rhythm like chest pain; dizziness; fast, irregular heartbeat; palpitations; feeling faint or lightheaded; falls signs and symptoms of infection like fever; chills; cough; sore throat; pain or trouble passing urine signs and symptoms of liver injury like dark yellow or brown urine; general ill feeling or flu-like symptoms; light-colored stools; loss of appetite; nausea; right upper belly pain; unusually weak or tired; yellowing of the eyes or skin signs  and symptoms of low red blood cells or anemia such as unusually weak or tired; feeling faint or lightheaded; falls signs and symptoms of muscle injury like dark urine; trouble passing urine or change in the amount of urine; unusually weak or tired; muscle pain; back pain Side effects that usually do not require medical attention (report to your doctor or health care professional if they continue or are bothersome): changes in taste diarrhea gas hair loss loss of appetite mouth sores This list may not describe all possible side effects. Call your doctor for medical advice about side effects. You may report side effects to FDA at 1-800-FDA-1088. Where should I keep my medication? This drug is given in a hospital or clinic and will not be stored at home. NOTE: This sheet is a summary. It may not cover all possible information. If you have questions about  this medicine, talk to your doctor, pharmacist, or health care provider.  2022 Elsevier/Gold Standard (2018-08-28 12:20:35)  Leucovorin injection What is this medication? LEUCOVORIN (loo koe VOR in) is used to prevent or treat the harmful effects of some medicines. This medicine is used to treat anemia caused by a low amount of folic acid in the body. It is also used with 5-fluorouracil (5-FU) to treat colon cancer. This medicine may be used for other purposes; ask your health care provider or pharmacist if you have questions. What should I tell my care team before I take this medication? They need to know if you have any of these conditions: anemia from low levels of vitamin B-12 in the blood an unusual or allergic reaction to leucovorin, folic acid, other medicines, foods, dyes, or preservatives pregnant or trying to get pregnant breast-feeding How should I use this medication? This medicine is for injection into a muscle or into a vein. It is given by a health care professional in a hospital or clinic setting. Talk to your pediatrician regarding the use of this medicine in children. Special care may be needed. Overdosage: If you think you have taken too much of this medicine contact a poison control center or emergency room at once. NOTE: This medicine is only for you. Do not share this medicine with others. What if I miss a dose? This does not apply. What may interact with this medication? capecitabine fluorouracil phenobarbital phenytoin primidone trimethoprim-sulfamethoxazole This list may not describe all possible interactions. Give your health care provider a list of all the medicines, herbs, non-prescription drugs, or dietary supplements you use. Also tell them if you smoke, drink alcohol, or use illegal drugs. Some items may interact with your medicine. What should I watch for while using this medication? Your condition will be monitored carefully while you are receiving this  medicine. This medicine may increase the side effects of 5-fluorouracil, 5-FU. Tell your doctor or health care professional if you have diarrhea or mouth sores that do not get better or that get worse. What side effects may I notice from receiving this medication? Side effects that you should report to your doctor or health care professional as soon as possible: allergic reactions like skin rash, itching or hives, swelling of the face, lips, or tongue breathing problems fever, infection mouth sores unusual bleeding or bruising unusually weak or tired Side effects that usually do not require medical attention (report to your doctor or health care professional if they continue or are bothersome): constipation or diarrhea loss of appetite nausea, vomiting This list may not describe all possible side effects. Call your  doctor for medical advice about side effects. You may report side effects to FDA at 1-800-FDA-1088. Where should I keep my medication? This drug is given in a hospital or clinic and will not be stored at home. NOTE: This sheet is a summary. It may not cover all possible information. If you have questions about this medicine, talk to your doctor, pharmacist, or health care provider.  2022  Fluorouracil, 5-FU injection What is this medication? FLUOROURACIL, 5-FU (flure oh YOOR a sil) is a chemotherapy drug. It slows the growth of cancer cells. This medicine is used to treat many types of cancer like breast cancer, colon or rectal cancer, pancreatic cancer, and stomach cancer. This medicine may be used for other purposes; ask your health care provider or pharmacist if you have questions. COMMON BRAND NAME(S): Adrucil What should I tell my care team before I take this medication? They need to know if you have any of these conditions: blood disorders dihydropyrimidine dehydrogenase (DPD) deficiency infection (especially a virus infection such as chickenpox, cold sores, or  herpes) kidney disease liver disease malnourished, poor nutrition recent or ongoing radiation therapy an unusual or allergic reaction to fluorouracil, other chemotherapy, other medicines, foods, dyes, or preservatives pregnant or trying to get pregnant breast-feeding How should I use this medication? This drug is given as an infusion or injection into a vein. It is administered in a hospital or clinic by a specially trained health care professional. Talk to your pediatrician regarding the use of this medicine in children. Special care may be needed. Overdosage: If you think you have taken too much of this medicine contact a poison control center or emergency room at once. NOTE: This medicine is only for you. Do not share this medicine with others. What if I miss a dose? It is important not to miss your dose. Call your doctor or health care professional if you are unable to keep an appointment. What may interact with this medication? Do not take this medicine with any of the following medications: live virus vaccines This medicine may also interact with the following medications: medicines that treat or prevent blood clots like warfarin, enoxaparin, and dalteparin This list may not describe all possible interactions. Give your health care provider a list of all the medicines, herbs, non-prescription drugs, or dietary supplements you use. Also tell them if you smoke, drink alcohol, or use illegal drugs. Some items may interact with your medicine. What should I watch for while using this medication? Visit your doctor for checks on your progress. This drug may make you feel generally unwell. This is not uncommon, as chemotherapy can affect healthy cells as well as cancer cells. Report any side effects. Continue your course of treatment even though you feel ill unless your doctor tells you to stop. In some cases, you may be given additional medicines to help with side effects. Follow all directions  for their use. Call your doctor or health care professional for advice if you get a fever, chills or sore throat, or other symptoms of a cold or flu. Do not treat yourself. This drug decreases your body's ability to fight infections. Try to avoid being around people who are sick. This medicine may increase your risk to bruise or bleed. Call your doctor or health care professional if you notice any unusual bleeding. Be careful brushing and flossing your teeth or using a toothpick because you may get an infection or bleed more easily. If you have any dental work done, tell  your dentist you are receiving this medicine. Avoid taking products that contain aspirin, acetaminophen, ibuprofen, naproxen, or ketoprofen unless instructed by your doctor. These medicines may hide a fever. Do not become pregnant while taking this medicine. Women should inform their doctor if they wish to become pregnant or think they might be pregnant. There is a potential for serious side effects to an unborn child. Talk to your health care professional or pharmacist for more information. Do not breast-feed an infant while taking this medicine. Men should inform their doctor if they wish to father a child. This medicine may lower sperm counts. Do not treat diarrhea with over the counter products. Contact your doctor if you have diarrhea that lasts more than 2 days or if it is severe and watery. This medicine can make you more sensitive to the sun. Keep out of the sun. If you cannot avoid being in the sun, wear protective clothing and use sunscreen. Do not use sun lamps or tanning beds/booths. What side effects may I notice from receiving this medication? Side effects that you should report to your doctor or health care professional as soon as possible: allergic reactions like skin rash, itching or hives, swelling of the face, lips, or tongue low blood counts - this medicine may decrease the number of white blood cells, red blood cells  and platelets. You may be at increased risk for infections and bleeding. signs of infection - fever or chills, cough, sore throat, pain or difficulty passing urine signs of decreased platelets or bleeding - bruising, pinpoint red spots on the skin, black, tarry stools, blood in the urine signs of decreased red blood cells - unusually weak or tired, fainting spells, lightheadedness breathing problems changes in vision chest pain mouth sores nausea and vomiting pain, swelling, redness at site where injected pain, tingling, numbness in the hands or feet redness, swelling, or sores on hands or feet stomach pain unusual bleeding Side effects that usually do not require medical attention (report to your doctor or health care professional if they continue or are bothersome): changes in finger or toe nails diarrhea dry or itchy skin hair loss headache loss of appetite sensitivity of eyes to the light stomach upset unusually teary eyes This list may not describe all possible side effects. Call your doctor for medical advice about side effects. You may report side effects to FDA at 1-800-FDA-1088. Where should I keep my medication? This drug is given in a hospital or clinic and will not be stored at home. NOTE: This sheet is a summary. It may not cover all possible information. If you have questions about this medicine, talk to your doctor, pharmacist, or health care provider.  2022 Elsevier/Gold Standard (2019-03-11 15:00:03)  Oxaliplatin Injection What is this medication? OXALIPLATIN (ox AL i PLA tin) is a chemotherapy drug. It targets fast dividing cells, like cancer cells, and causes these cells to die. This medicine is used to treat cancers of the colon and rectum, and many other cancers. This medicine may be used for other purposes; ask your health care provider or pharmacist if you have questions. COMMON BRAND NAME(S): Eloxatin What should I tell my care team before I take this  medication? They need to know if you have any of these conditions: heart disease history of irregular heartbeat liver disease low blood counts, like white cells, platelets, or red blood cells lung or breathing disease, like asthma take medicines that treat or prevent blood clots tingling of the fingers or toes, or  other nerve disorder an unusual or allergic reaction to oxaliplatin, other chemotherapy, other medicines, foods, dyes, or preservatives pregnant or trying to get pregnant breast-feeding How should I use this medication? This drug is given as an infusion into a vein. It is administered in a hospital or clinic by a specially trained health care professional. Talk to your pediatrician regarding the use of this medicine in children. Special care may be needed. Overdosage: If you think you have taken too much of this medicine contact a poison control center or emergency room at once. NOTE: This medicine is only for you. Do not share this medicine with others. What if I miss a dose? It is important not to miss a dose. Call your doctor or health care professional if you are unable to keep an appointment. What may interact with this medication? Do not take this medicine with any of the following medications: cisapride dronedarone pimozide thioridazine This medicine may also interact with the following medications: aspirin and aspirin-like medicines certain medicines that treat or prevent blood clots like warfarin, apixaban, dabigatran, and rivaroxaban cisplatin cyclosporine diuretics medicines for infection like acyclovir, adefovir, amphotericin B, bacitracin, cidofovir, foscarnet, ganciclovir, gentamicin, pentamidine, vancomycin NSAIDs, medicines for pain and inflammation, like ibuprofen or naproxen other medicines that prolong the QT interval (an abnormal heart rhythm) pamidronate zoledronic acid This list may not describe all possible interactions. Give your health care  provider a list of all the medicines, herbs, non-prescription drugs, or dietary supplements you use. Also tell them if you smoke, drink alcohol, or use illegal drugs. Some items may interact with your medicine. What should I watch for while using this medication? Your condition will be monitored carefully while you are receiving this medicine. You may need blood work done while you are taking this medicine. This medicine may make you feel generally unwell. This is not uncommon as chemotherapy can affect healthy cells as well as cancer cells. Report any side effects. Continue your course of treatment even though you feel ill unless your healthcare professional tells you to stop. This medicine can make you more sensitive to cold. Do not drink cold drinks or use ice. Cover exposed skin before coming in contact with cold temperatures or cold objects. When out in cold weather wear warm clothing and cover your mouth and nose to warm the air that goes into your lungs. Tell your doctor if you get sensitive to the cold. Do not become pregnant while taking this medicine or for 9 months after stopping it. Women should inform their health care professional if they wish to become pregnant or think they might be pregnant. Men should not father a child while taking this medicine and for 6 months after stopping it. There is potential for serious side effects to an unborn child. Talk to your health care professional for more information. Do not breast-feed a child while taking this medicine or for 3 months after stopping it. This medicine has caused ovarian failure in some women. This medicine may make it more difficult to get pregnant. Talk to your health care professional if you are concerned about your fertility. This medicine has caused decreased sperm counts in some men. This may make it more difficult to father a child. Talk to your health care professional if you are concerned about your fertility. This medicine may  increase your risk of getting an infection. Call your health care professional for advice if you get a fever, chills, or sore throat, or other symptoms of a  cold or flu. Do not treat yourself. Try to avoid being around people who are sick. Avoid taking medicines that contain aspirin, acetaminophen, ibuprofen, naproxen, or ketoprofen unless instructed by your health care professional. These medicines may hide a fever. Be careful brushing or flossing your teeth or using a toothpick because you may get an infection or bleed more easily. If you have any dental work done, tell your dentist you are receiving this medicine. What side effects may I notice from receiving this medication? Side effects that you should report to your doctor or health care professional as soon as possible: allergic reactions like skin rash, itching or hives, swelling of the face, lips, or tongue breathing problems cough low blood counts - this medicine may decrease the number of white blood cells, red blood cells, and platelets. You may be at increased risk for infections and bleeding nausea, vomiting pain, redness, or irritation at site where injected pain, tingling, numbness in the hands or feet signs and symptoms of bleeding such as bloody or black, tarry stools; red or dark brown urine; spitting up blood or brown material that looks like coffee grounds; red spots on the skin; unusual bruising or bleeding from the eyes, gums, or nose signs and symptoms of a dangerous change in heartbeat or heart rhythm like chest pain; dizziness; fast, irregular heartbeat; palpitations; feeling faint or lightheaded; falls signs and symptoms of infection like fever; chills; cough; sore throat; pain or trouble passing urine signs and symptoms of liver injury like dark yellow or brown urine; general ill feeling or flu-like symptoms; light-colored stools; loss of appetite; nausea; right upper belly pain; unusually weak or tired; yellowing of the  eyes or skin signs and symptoms of low red blood cells or anemia such as unusually weak or tired; feeling faint or lightheaded; falls signs and symptoms of muscle injury like dark urine; trouble passing urine or change in the amount of urine; unusually weak or tired; muscle pain; back pain Side effects that usually do not require medical attention (report to your doctor or health care professional if they continue or are bothersome): changes in taste diarrhea gas hair loss loss of appetite mouth sores This list may not describe all possible side effects. Call your doctor for medical advice about side effects. You may report side effects to FDA at 1-800-FDA-1088. Where should I keep my medication? This drug is given in a hospital or clinic and will not be stored at home. NOTE: This sheet is a summary. It may not cover all possible information. If you have questions about this medicine, talk to your doctor, pharmacist, or health care provider.  2022 [employer] Standard (2018-08-28 12:20:35)  Leucovorin injection What is this medication? LEUCOVORIN (loo koe VOR in) is used to prevent or treat the harmful effects of some medicines. This medicine is used to treat anemia caused by a low amount of folic acid in the body. It is also used with 5-fluorouracil (5-FU) to treat colon cancer. This medicine may be used for other purposes; ask your health care provider or pharmacist if you have questions. What should I tell my care team before I take this medication? They need to know if you have any of these conditions: anemia from low levels of vitamin B-12 in the blood an unusual or allergic reaction to leucovorin, folic acid, other medicines, foods, dyes, or preservatives pregnant or trying to get pregnant breast-feeding How should I use this medication? This medicine is for injection into a muscle or  into a vein. It is given by a health care professional in a hospital or clinic setting. Talk to  your pediatrician regarding the use of this medicine in children. Special care may be needed. Overdosage: If you think you have taken too much of this medicine contact a poison control center or emergency room at once. NOTE: This medicine is only for you. Do not share this medicine with others. What if I miss a dose? This does not apply. What may interact with this medication? capecitabine fluorouracil phenobarbital phenytoin primidone trimethoprim-sulfamethoxazole This list may not describe all possible interactions. Give your health care provider a list of all the medicines, herbs, non-prescription drugs, or dietary supplements you use. Also tell them if you smoke, drink alcohol, or use illegal drugs. Some items may interact with your medicine. What should I watch for while using this medication? Your condition will be monitored carefully while you are receiving this medicine. This medicine may increase the side effects of 5-fluorouracil, 5-FU. Tell your doctor or health care professional if you have diarrhea or mouth sores that do not get better or that get worse. What side effects may I notice from receiving this medication? Side effects that you should report to your doctor or health care professional as soon as possible: allergic reactions like skin rash, itching or hives, swelling of the face, lips, or tongue breathing problems fever, infection mouth sores unusual bleeding or bruising unusually weak or tired Side effects that usually do not require medical attention (report to your doctor or health care professional if they continue or are bothersome): constipation or diarrhea loss of appetite nausea, vomiting This list may not describe all possible side effects. Call your doctor for medical advice about side effects. You may report side effects to FDA at 1-800-FDA-1088. Where should I keep my medication? This drug is given in a hospital or clinic and will not be stored at  home. NOTE: This sheet is a summary. It may not cover all possible information. If you have questions about this medicine, talk to your doctor, pharmacist, or health care provider.  2022 [employer]/Gold Standard (2007-10-15 16:50:29)  Fluorouracil, 5-FU injection What is this medication? FLUOROURACIL, 5-FU (flure oh YOOR a sil) is a chemotherapy drug. It slows the growth of cancer cells. This medicine is used to treat many types of cancer like breast cancer, colon or rectal cancer, pancreatic cancer, and stomach cancer. This medicine may be used for other purposes; ask your health care provider or pharmacist if you have questions. COMMON BRAND NAME(S): Adrucil What should I tell my care team before I take this medication? They need to know if you have any of these conditions: blood disorders dihydropyrimidine dehydrogenase (DPD) deficiency infection (especially a virus infection such as chickenpox, cold sores, or herpes) kidney disease liver disease malnourished, poor nutrition recent or ongoing radiation therapy an unusual or allergic reaction to fluorouracil, other chemotherapy, other medicines, foods, dyes, or preservatives pregnant or trying to get pregnant breast-feeding How should I use this medication? This drug is given as an infusion or injection into a vein. It is administered in a hospital or clinic by a specially trained health care professional. Talk to your pediatrician regarding the use of this medicine in children. Special care may be needed. Overdosage: If you think you have taken too much of this medicine contact a poison control center or emergency room at once. NOTE: This medicine is only for you. Do not share this medicine with others. What if I  miss a dose? It is important not to miss your dose. Call your doctor or health care professional if you are unable to keep an appointment. What may interact with this medication? Do not take this medicine with any of the  following medications: live virus vaccines This medicine may also interact with the following medications: medicines that treat or prevent blood clots like warfarin, enoxaparin, and dalteparin This list may not describe all possible interactions. Give your health care provider a list of all the medicines, herbs, non-prescription drugs, or dietary supplements you use. Also tell them if you smoke, drink alcohol, or use illegal drugs. Some items may interact with your medicine. What should I watch for while using this medication? Visit your doctor for checks on your progress. This drug may make you feel generally unwell. This is not uncommon, as chemotherapy can affect healthy cells as well as cancer cells. Report any side effects. Continue your course of treatment even though you feel ill unless your doctor tells you to stop. In some cases, you may be given additional medicines to help with side effects. Follow all directions for their use. Call your doctor or health care professional for advice if you get a fever, chills or sore throat, or other symptoms of a cold or flu. Do not treat yourself. This drug decreases your body's ability to fight infections. Try to avoid being around people who are sick. This medicine may increase your risk to bruise or bleed. Call your doctor or health care professional if you notice any unusual bleeding. Be careful brushing and flossing your teeth or using a toothpick because you may get an infection or bleed more easily. If you have any dental work done, tell your dentist you are receiving this medicine. Avoid taking products that contain aspirin, acetaminophen, ibuprofen, naproxen, or ketoprofen unless instructed by your doctor. These medicines may hide a fever. Do not become pregnant while taking this medicine. Women should inform their doctor if they wish to become pregnant or think they might be pregnant. There is a potential for serious side effects to an unborn  child. Talk to your health care professional or pharmacist for more information. Do not breast-feed an infant while taking this medicine. Men should inform their doctor if they wish to father a child. This medicine may lower sperm counts. Do not treat diarrhea with over the counter products. Contact your doctor if you have diarrhea that lasts more than 2 days or if it is severe and watery. This medicine can make you more sensitive to the sun. Keep out of the sun. If you cannot avoid being in the sun, wear protective clothing and use sunscreen. Do not use sun lamps or tanning beds/booths. What side effects may I notice from receiving this medication? Side effects that you should report to your doctor or health care professional as soon as possible: allergic reactions like skin rash, itching or hives, swelling of the face, lips, or tongue low blood counts - this medicine may decrease the number of white blood cells, red blood cells and platelets. You may be at increased risk for infections and bleeding. signs of infection - fever or chills, cough, sore throat, pain or difficulty passing urine signs of decreased platelets or bleeding - bruising, pinpoint red spots on the skin, black, tarry stools, blood in the urine signs of decreased red blood cells - unusually weak or tired, fainting spells, lightheadedness breathing problems changes in vision chest pain mouth sores nausea and vomiting pain,  swelling, redness at site where injected pain, tingling, numbness in the hands or feet redness, swelling, or sores on hands or feet stomach pain unusual bleeding Side effects that usually do not require medical attention (report to your doctor or health care professional if they continue or are bothersome): changes in finger or toe nails diarrhea dry or itchy skin hair loss headache loss of appetite sensitivity of eyes to the light stomach upset unusually teary eyes This list may not describe all  possible side effects. Call your doctor for medical advice about side effects. You may report side effects to FDA at 1-800-FDA-1088. Where should I keep my medication? This drug is given in a hospital or clinic and will not be stored at home. NOTE: This sheet is a summary. It may not cover all possible information. If you have questions about this medicine, talk to your doctor, pharmacist, or health care provider.  2022 Elsevier/Gold Standard (2019-03-11 15:00:03)

## 2021-02-21 NOTE — Progress Notes (Signed)
Patient seen by Lisa Thomas NP today  Vitals are within treatment parameters.  Labs reviewed by Lisa Thomas NP and are within treatment parameters.  Per physician team, patient is ready for treatment and there are NO modifications to the treatment plan.     

## 2021-02-21 NOTE — Progress Notes (Signed)
[facility] OFFICE PROGRESS NOTE   Diagnosis: Colon cancer  INTERVAL HISTORY:   [name] returns as scheduled.  He feels well.  No nausea or vomiting.  Bowels moving regularly, typically twice a day.  No diarrhea.  He denies pain.  Recent mild nasal congestion.  Reports negative COVID test.  No fever or cough.  Objective:  Vital signs in last 24 hours:  Blood pressure 140/63, pulse 76, temperature 97.8 F (36.6 C), temperature source Oral, resp. rate 18, height _0  (1.727 m), weight 180 lb 3.2 oz (81.7 kg), SpO2 98 %.    HEENT: No thrush or ulcers.  Visualized dental extraction sites appear healed. Resp: Lungs clear bilaterally. Cardio: Regular rate and rhythm. GI: Abdomen soft and nontender.  No hepatomegaly. Vascular: No leg edema. Port-A-Cath without erythema.  Lab Results:  Lab Results  Component Value Date   WBC 5.8 02/21/2021   HGB 12.7 (L) 02/21/2021   HCT 39.8 02/21/2021   MCV 83.3 02/21/2021   PLT 172 02/21/2021   NEUTROABS 3.7 02/21/2021    Imaging:  No results found.  Medications: I have reviewed the patient's current medications.  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), transverse colon cancer stage IIB (T4aN0) status post extended right hemicolectomy 05/07/2019, MSS Tumor invasive superficial portion of the muscle ("internal third "), no tumor perforation, no vascular invasion or perineural invasion, negative resection margins, 0/4 lymph nodes Elevated CEA 02/10/2019 CTs 03/05/2019, compared to outside CTs from 11/22/2018-worsened wall thickening in the cecum stable apple core lesion in the mid transverse colon, no evidence of recurrent tumor at the distal colonic anastomosis, no evidence of metastatic disease Colonoscopy 03/24/2019 20-2 malignant appearing masses noted in the colon, 1 filled the cecum measuring  approximately 4 cm across and friable.  The other malignant appearing mass was circumferential creating a stricture with a 1.2 cm lumen located in the transverse segment approximately 4 to 5 cm in length.  There were 12-18 neoplastic appearing polyps in between the 2 obvious malignant masses ranging in size from 3 mm to 2 cm.  5 polyps distal to the transverse colon mass.  2 sigmoid colon polyps.  Cecum mass positive for adenocarcinoma.  Transverse colon mass positive for adenocarcinoma. 05/07/2019 laparoscopic extended right hemicolectomy, lysis of adhesions-5 cm invasive moderately differentiated adenocarcinoma involving cecum and proximal ascending colon, carcinoma invades into the pericolonic soft tissue; 7 cm invasive moderately differentiated carcinoma involving the transverse colon, carcinoma invades into the serosal surface (visceral peritoneum); all resection margins negative for carcinoma.  Lymphovascular invasion present.  In the proximal colon metastatic carcinoma involves 3 of 17 lymph nodes with 1 tumor deposit.  In the transverse colon 12 lymph nodes negative for metastatic carcinoma.  3 separate polyps: Tubular adenoma, inflammatory polyp and hyperplastic polyp Cycle 1 capecitabine 06/02/2019 Cycle 2 capecitabine 06/23/2019 Cycle 3 capecitabine 07/14/2019 Cycle 4 capecitabine 08/04/2019 Cycle 5 capecitabine 08/25/2019 Cycle 6 capecitabine 09/15/2019 Cycle 7 capecitabine 10/06/2019 Cycle 8 capecitabine 10/27/2019 Upper endoscopy 07/23/2020-negative Colonoscopy 07/23/2020-normal-appearing anastomoses, polyp removed from the descending colon-tubular adenoma CTs 01/18/2021-right abdominal mesenteric mass, mass in the low anatomic pelvis consistent with metastases Cycle 1 FOLFOX 02/21/2021 2.   Microcytic anemia secondary to #1.    Resolved 3.   Family history of pancreas and colon cancer, negative genetic testing per the [facility] panel, ATM variant of unknown significance    Disposition: [name] appears  well.  He is accompanied by his son and an interpreter.  Plan to proceed with cycle 1 FOLFOX today as scheduled.  Potential toxicities again reviewed.  He agrees to proceed.  We reviewed the CBC and chemistry panel from today.  Labs adequate to proceed as above.  He will return for lab, follow-up, cycle 2 FOLFOX in 2 weeks.  We are available to see him sooner if needed.    Ned Card ANP/GNP-BC   02/21/2021  8:46 AM

## 2021-02-22 ENCOUNTER — Telehealth: Payer: Self-pay | Admitting: Emergency Medicine

## 2021-02-22 NOTE — Telephone Encounter (Signed)
24 Hour Callback 24 hour callback for 1st time Oxaliplatin, leucovorin, Fluorouracil. Spoke with his son, reporting that pt slept well last night, no sign or symptoms of side effects.  Was advised that if he has any problem to call.

## 2021-02-23 ENCOUNTER — Other Ambulatory Visit: Payer: Self-pay

## 2021-02-23 ENCOUNTER — Inpatient Hospital Stay: Payer: Self-pay | Attending: Oncology

## 2021-02-23 VITALS — BP 147/65 | HR 70 | Temp 98.4°F | Resp 18

## 2021-02-23 DIAGNOSIS — C187 Malignant neoplasm of sigmoid colon: Secondary | ICD-10-CM | POA: Insufficient documentation

## 2021-02-23 DIAGNOSIS — Z5111 Encounter for antineoplastic chemotherapy: Secondary | ICD-10-CM | POA: Insufficient documentation

## 2021-02-23 DIAGNOSIS — Z23 Encounter for immunization: Secondary | ICD-10-CM | POA: Insufficient documentation

## 2021-02-23 DIAGNOSIS — C189 Malignant neoplasm of colon, unspecified: Secondary | ICD-10-CM

## 2021-02-23 DIAGNOSIS — C182 Malignant neoplasm of ascending colon: Secondary | ICD-10-CM | POA: Insufficient documentation

## 2021-02-23 DIAGNOSIS — C772 Secondary and unspecified malignant neoplasm of intra-abdominal lymph nodes: Secondary | ICD-10-CM | POA: Insufficient documentation

## 2021-02-23 DIAGNOSIS — C184 Malignant neoplasm of transverse colon: Secondary | ICD-10-CM | POA: Insufficient documentation

## 2021-02-23 MED ORDER — HEPARIN SOD (PORK) LOCK FLUSH 100 UNIT/ML IV SOLN
500.0000 [IU] | Freq: Once | INTRAVENOUS | Status: AC | PRN
  Administered 2021-02-23: 500 [IU]

## 2021-02-23 MED ORDER — SODIUM CHLORIDE 0.9% FLUSH
10.0000 mL | INTRAVENOUS | Status: DC | PRN
  Administered 2021-02-23: 10 mL

## 2021-02-28 ENCOUNTER — Encounter: Payer: Self-pay | Admitting: Oncology

## 2021-03-01 ENCOUNTER — Telehealth: Payer: Self-pay | Admitting: *Deleted

## 2021-03-01 NOTE — Telephone Encounter (Signed)
Son called to report sinus congestion and cough. Thinks he has a cold. No fever or shortness of breath. Asking what he can take? Informed him OK to take any OTC cold medication he has taken in the past and push po fluids. Call for fever or shortness of breath.

## 2021-03-06 ENCOUNTER — Other Ambulatory Visit: Payer: Self-pay | Admitting: Oncology

## 2021-03-07 ENCOUNTER — Inpatient Hospital Stay: Payer: Self-pay

## 2021-03-07 ENCOUNTER — Inpatient Hospital Stay (HOSPITAL_BASED_OUTPATIENT_CLINIC_OR_DEPARTMENT_OTHER): Payer: Self-pay | Admitting: Nurse Practitioner

## 2021-03-07 ENCOUNTER — Encounter: Payer: Self-pay | Admitting: Nurse Practitioner

## 2021-03-07 ENCOUNTER — Other Ambulatory Visit: Payer: Self-pay

## 2021-03-07 VITALS — BP 158/67 | HR 80 | Temp 98.1°F | Resp 20 | Ht 68.0 in | Wt 171.4 lb

## 2021-03-07 DIAGNOSIS — C189 Malignant neoplasm of colon, unspecified: Secondary | ICD-10-CM

## 2021-03-07 DIAGNOSIS — C187 Malignant neoplasm of sigmoid colon: Secondary | ICD-10-CM

## 2021-03-07 LAB — CBC WITH DIFFERENTIAL (CANCER CENTER ONLY)
Abs Immature Granulocytes: 0.02 10*3/uL (ref 0.00–0.07)
Basophils Absolute: 0 10*3/uL (ref 0.0–0.1)
Basophils Relative: 1 %
Eosinophils Absolute: 0.2 10*3/uL (ref 0.0–0.5)
Eosinophils Relative: 5 %
HCT: 37.9 % — ABNORMAL LOW (ref 39.0–52.0)
Hemoglobin: 12 g/dL — ABNORMAL LOW (ref 13.0–17.0)
Immature Granulocytes: 1 %
Lymphocytes Relative: 34 %
Lymphs Abs: 1.4 10*3/uL (ref 0.7–4.0)
MCH: 26.4 pg (ref 26.0–34.0)
MCHC: 31.7 g/dL (ref 30.0–36.0)
MCV: 83.3 fL (ref 80.0–100.0)
Monocytes Absolute: 0.8 10*3/uL (ref 0.1–1.0)
Monocytes Relative: 19 %
Neutro Abs: 1.6 10*3/uL — ABNORMAL LOW (ref 1.7–7.7)
Neutrophils Relative %: 40 %
Platelet Count: 287 10*3/uL (ref 150–400)
RBC: 4.55 MIL/uL (ref 4.22–5.81)
RDW: 14.4 % (ref 11.5–15.5)
WBC Count: 4 10*3/uL (ref 4.0–10.5)
nRBC: 0 % (ref 0.0–0.2)

## 2021-03-07 LAB — CMP (CANCER CENTER ONLY)
ALT: 25 U/L (ref 0–44)
AST: 21 U/L (ref 15–41)
Albumin: 3.7 g/dL (ref 3.5–5.0)
Alkaline Phosphatase: 69 U/L (ref 38–126)
Anion gap: 9 (ref 5–15)
BUN: 16 mg/dL (ref 8–23)
CO2: 25 mmol/L (ref 22–32)
Calcium: 9.6 mg/dL (ref 8.9–10.3)
Chloride: 104 mmol/L (ref 98–111)
Creatinine: 0.98 mg/dL (ref 0.61–1.24)
GFR, Estimated: >60 mL/min (ref >60)
Glucose, Bld: 115 mg/dL — ABNORMAL HIGH (ref 70–99)
Potassium: 3.9 mmol/L (ref 3.5–5.1)
Sodium: 138 mmol/L (ref 135–145)
Total Bilirubin: 0.3 mg/dL (ref 0.3–1.2)
Total Protein: 7.3 g/dL (ref 6.5–8.1)

## 2021-03-07 MED ORDER — FLUOROURACIL CHEMO INJECTION 2.5 GM/50ML
300.0000 mg/m2 | Freq: Once | INTRAVENOUS | Status: AC
  Administered 2021-03-07: 600 mg via INTRAVENOUS
  Filled 2021-03-07: qty 12

## 2021-03-07 MED ORDER — DEXTROSE 5 % IV SOLN: Freq: Once | INTRAVENOUS | Status: AC

## 2021-03-07 MED ORDER — PALONOSETRON HCL INJECTION 0.25 MG/5ML
0.2500 mg | Freq: Once | INTRAVENOUS | Status: AC
  Administered 2021-03-07: 0.25 mg via INTRAVENOUS
  Filled 2021-03-07: qty 5

## 2021-03-07 MED ORDER — INFLUENZA VAC A&B SA ADJ QUAD 0.5 ML IM PRSY
0.5000 mL | PREFILLED_SYRINGE | Freq: Once | INTRAMUSCULAR | Status: AC
  Administered 2021-03-07: 0.5 mL via INTRAMUSCULAR
  Filled 2021-03-07: qty 0.5

## 2021-03-07 MED ORDER — LEUCOVORIN CALCIUM INJECTION 350 MG
300.0000 mg/m2 | Freq: Once | INTRAVENOUS | Status: AC
  Administered 2021-03-07: 598 mg via INTRAVENOUS
  Filled 2021-03-07: qty 29.9

## 2021-03-07 MED ORDER — OXALIPLATIN CHEMO INJECTION 100 MG/20ML
65.0000 mg/m2 | Freq: Once | INTRAVENOUS | Status: AC
  Administered 2021-03-07: 130 mg via INTRAVENOUS
  Filled 2021-03-07: qty 10

## 2021-03-07 MED ORDER — SODIUM CHLORIDE 0.9 % IV SOLN
10.0000 mg | Freq: Once | INTRAVENOUS | Status: AC
  Administered 2021-03-07: 10 mg via INTRAVENOUS
  Filled 2021-03-07: qty 1

## 2021-03-07 MED ORDER — PROCHLORPERAZINE MALEATE 10 MG PO TABS: 10.0000 mg | ORAL_TABLET | Freq: Four times a day (QID) | ORAL | 3 refills | Status: DC | PRN

## 2021-03-07 MED ORDER — SODIUM CHLORIDE 0.9 % IV SOLN
2000.0000 mg/m2 | INTRAVENOUS | Status: DC
  Administered 2021-03-07: 4000 mg via INTRAVENOUS
  Filled 2021-03-07: qty 80

## 2021-03-07 NOTE — Patient Instructions (Addendum)
The chemotherapy medication bag should finish at 46 hours, 96 hours, or 7 days. For example, if your pump is scheduled for 46 hours and it was put on at 4:00 p.m., it should finish at 2:00 p.m. the day it is scheduled to come off regardless of your appointment time.     Estimated time to finish at  Wednesday, November 16 at 1:00.   If the display on your pump reads "Low Volume" and it is beeping, take the batteries out of the pump and come to the cancer center for it to be taken off.   If the pump alarms go off prior to the pump reading "Low Volume" then call [phone] and someone can assist you.  If the plunger comes out and the chemotherapy medication is leaking out, please use your home chemo spill kit to clean up the spill. Do NOT use paper towels or other household products.  If you have problems or questions regarding your pump, please call either [phone] (24 hours a day) or the cancer center Monday-Friday 8:00 a.m.- 4:30 p.m. at the clinic number and we will assist you. If you are unable to get assistance, then go to the nearest Emergency Department and ask the staff to contact the IV team for assistance.  John Vance   Discharge Instructions: Thank you for choosing [facility] to provide your oncology and hematology care.   If you have a lab appointment with the [facility], please go directly to the [facility] and check in at the registration area.   Wear comfortable clothing and clothing appropriate for easy access to any Portacath or PICC line.   We strive to give you quality time with your provider. You may need to reschedule your appointment if you arrive late (15 or more minutes).  Arriving late affects you and other patients whose appointments are after yours.  Also, if you miss three or more appointments without notifying the office, you may be dismissed from the clinic at the provider's discretion.      For prescription  refill requests, have your pharmacy contact our office and allow 72 hours for refills to be completed.    Today you received the following chemotherapy and/or immunotherapy agents Oxaliplatin, leucovorin, fluorouracil      To help prevent nausea and vomiting after your treatment, we encourage you to take your nausea medication as directed.  BELOW ARE SYMPTOMS THAT SHOULD BE REPORTED IMMEDIATELY: *FEVER GREATER THAN 100.4 F (38 C) OR HIGHER *CHILLS OR SWEATING *NAUSEA AND VOMITING THAT IS NOT CONTROLLED WITH YOUR NAUSEA MEDICATION *UNUSUAL SHORTNESS OF BREATH *UNUSUAL BRUISING OR BLEEDING *URINARY PROBLEMS (pain or burning when urinating, or frequent urination) *BOWEL PROBLEMS (unusual diarrhea, constipation, pain near the anus) TENDERNESS IN MOUTH AND THROAT WITH OR WITHOUT PRESENCE OF ULCERS (sore throat, sores in mouth, or a toothache) UNUSUAL RASH, SWELLING OR PAIN  UNUSUAL VAGINAL DISCHARGE OR ITCHING   Items with * indicate a potential emergency and should be followed up as soon as possible or go to the Emergency Department if any problems should occur.  Please show the CHEMOTHERAPY ALERT CARD or IMMUNOTHERAPY ALERT CARD at check-in to the Emergency Department and triage nurse.  Should you have questions after your visit or need to cancel or reschedule your appointment, please contact [facility]  Dept: [phone]  and follow the prompts.  Office hours are 8:00 a.m. to 4:30 p.m. Monday - Friday. Please note that  voicemails left after 4:00 p.m. may not be returned until the following business day.  We are closed weekends and major holidays. You have access to a nurse at all times for urgent questions. Please call the main number to the clinic Dept: [phone] and follow the prompts.   For any non-urgent questions, you may also contact your provider using MyChart. We now offer e-Visits for anyone 29 and older to request care online for non-urgent symptoms.  For details visit [email or web address].   Also download the MyChart app! Go to the app store, search "MyChart", open the app, select , and log in with your MyChart username and password.  Due to Covid, a mask is required upon entering the hospital/clinic. If you do not have a mask, one will be given to you upon arrival. For doctor visits, patients may have 1 support person aged 41 or older with them. For treatment visits, patients cannot have anyone with them due to current Covid guidelines and our immunocompromised population.   Oxaliplatin Injection What is this medication? OXALIPLATIN (ox AL i PLA tin) is a chemotherapy drug. It targets fast dividing cells, like cancer cells, and causes these cells to die. This medicine is used to treat cancers of the colon and rectum, and many other cancers. This medicine may be used for other purposes; ask your health care provider or pharmacist if you have questions. COMMON BRAND NAME(S): Eloxatin What should I tell my care team before I take this medication? They need to know if you have any of these conditions: heart disease history of irregular heartbeat liver disease low blood counts, like white cells, platelets, or red blood cells lung or breathing disease, like asthma take medicines that treat or prevent blood clots tingling of the fingers or toes, or other nerve disorder an unusual or allergic reaction to oxaliplatin, other chemotherapy, other medicines, foods, dyes, or preservatives pregnant or trying to get pregnant breast-feeding How should I use this medication? This drug is given as an infusion into a vein. It is administered in a hospital or clinic by a specially trained health care professional. Talk to your pediatrician regarding the use of this medicine in children. Special care may be needed. Overdosage: If you think you have taken too much of this medicine contact a poison control center or emergency room at once. NOTE:  This medicine is only for you. Do not share this medicine with others. What if I miss a dose? It is important not to miss a dose. Call your doctor or health care professional if you are unable to keep an appointment. What may interact with this medication? Do not take this medicine with any of the following medications: cisapride dronedarone pimozide thioridazine This medicine may also interact with the following medications: aspirin and aspirin-like medicines certain medicines that treat or prevent blood clots like warfarin, apixaban, dabigatran, and rivaroxaban cisplatin cyclosporine diuretics medicines for infection like acyclovir, adefovir, amphotericin B, bacitracin, cidofovir, foscarnet, ganciclovir, gentamicin, pentamidine, vancomycin NSAIDs, medicines for pain and inflammation, like ibuprofen or naproxen other medicines that prolong the QT interval (an abnormal heart rhythm) pamidronate zoledronic acid This list may not describe all possible interactions. Give your health care provider a list of all the medicines, herbs, non-prescription drugs, or dietary supplements you use. Also tell them if you smoke, drink alcohol, or use illegal drugs. Some items may interact with your medicine. What should I watch for while using this medication? Your condition will be monitored carefully while you are receiving  this medicine. You may need blood work done while you are taking this medicine. This medicine may make you feel generally unwell. This is not uncommon as chemotherapy can affect healthy cells as well as cancer cells. Report any side effects. Continue your course of treatment even though you feel ill unless your healthcare professional tells you to stop. This medicine can make you more sensitive to cold. Do not drink cold drinks or use ice. Cover exposed skin before coming in contact with cold temperatures or cold objects. When out in cold weather wear warm clothing and cover your mouth  and nose to warm the air that goes into your lungs. Tell your doctor if you get sensitive to the cold. Do not become pregnant while taking this medicine or for 9 months after stopping it. Women should inform their health care professional if they wish to become pregnant or think they might be pregnant. Men should not father a child while taking this medicine and for 6 months after stopping it. There is potential for serious side effects to an unborn child. Talk to your health care professional for more information. Do not breast-feed a child while taking this medicine or for 3 months after stopping it. This medicine has caused ovarian failure in some women. This medicine may make it more difficult to get pregnant. Talk to your health care professional if you are concerned about your fertility. This medicine has caused decreased sperm counts in some men. This may make it more difficult to father a child. Talk to your health care professional if you are concerned about your fertility. This medicine may increase your risk of getting an infection. Call your health care professional for advice if you get a fever, chills, or sore throat, or other symptoms of a cold or flu. Do not treat yourself. Try to avoid being around people who are sick. Avoid taking medicines that contain aspirin, acetaminophen, ibuprofen, naproxen, or ketoprofen unless instructed by your health care professional. These medicines may hide a fever. Be careful brushing or flossing your teeth or using a toothpick because you may get an infection or bleed more easily. If you have any dental work done, tell your dentist you are receiving this medicine. What side effects may I notice from receiving this medication? Side effects that you should report to your doctor or health care professional as soon as possible: allergic reactions like skin rash, itching or hives, swelling of the face, lips, or tongue breathing problems cough low blood counts  - this medicine may decrease the number of white blood cells, red blood cells, and platelets. You may be at increased risk for infections and bleeding nausea, vomiting pain, redness, or irritation at site where injected pain, tingling, numbness in the hands or feet signs and symptoms of bleeding such as bloody or black, tarry stools; red or dark brown urine; spitting up blood or brown material that looks like coffee grounds; red spots on the skin; unusual bruising or bleeding from the eyes, gums, or nose signs and symptoms of a dangerous change in heartbeat or heart rhythm like chest pain; dizziness; fast, irregular heartbeat; palpitations; feeling faint or lightheaded; falls signs and symptoms of infection like fever; chills; cough; sore throat; pain or trouble passing urine signs and symptoms of liver injury like dark yellow or brown urine; general ill feeling or flu-like symptoms; light-colored stools; loss of appetite; nausea; right upper belly pain; unusually weak or tired; yellowing of the eyes or skin signs and symptoms  of low red blood cells or anemia such as unusually weak or tired; feeling faint or lightheaded; falls signs and symptoms of muscle injury like dark urine; trouble passing urine or change in the amount of urine; unusually weak or tired; muscle pain; back pain Side effects that usually do not require medical attention (report to your doctor or health care professional if they continue or are bothersome): changes in taste diarrhea gas hair loss loss of appetite mouth sores This list may not describe all possible side effects. Call your doctor for medical advice about side effects. You may report side effects to FDA at 1-800-FDA-1088. Where should I keep my medication? This drug is given in a hospital or clinic and will not be stored at home. NOTE: This sheet is a summary. It may not cover all possible information. If you have questions about this medicine, talk to your doctor,  pharmacist, or health care provider.  2022 [employer]/Gold Standard (2020-12-28 00:00:00)  Leucovorin injection What is this medication? LEUCOVORIN (loo koe VOR in) is used to prevent or treat the harmful effects of some medicines. This medicine is used to treat anemia caused by a low amount of folic acid in the body. It is also used with 5-fluorouracil (5-FU) to treat colon cancer. This medicine may be used for other purposes; ask your health care provider or pharmacist if you have questions. What should I tell my care team before I take this medication? They need to know if you have any of these conditions: anemia from low levels of vitamin B-12 in the blood an unusual or allergic reaction to leucovorin, folic acid, other medicines, foods, dyes, or preservatives pregnant or trying to get pregnant breast-feeding How should I use this medication? This medicine is for injection into a muscle or into a vein. It is given by a health care professional in a hospital or clinic setting. Talk to your pediatrician regarding the use of this medicine in children. Special care may be needed. Overdosage: If you think you have taken too much of this medicine contact a poison control center or emergency room at once. NOTE: This medicine is only for you. Do not share this medicine with others. What if I miss a dose? This does not apply. What may interact with this medication? capecitabine fluorouracil phenobarbital phenytoin primidone trimethoprim-sulfamethoxazole This list may not describe all possible interactions. Give your health care provider a list of all the medicines, herbs, non-prescription drugs, or dietary supplements you use. Also tell them if you smoke, drink alcohol, or use illegal drugs. Some items may interact with your medicine. What should I watch for while using this medication? Your condition will be monitored carefully while you are receiving this medicine. This medicine may  increase the side effects of 5-fluorouracil, 5-FU. Tell your doctor or health care professional if you have diarrhea or mouth sores that do not get better or that get worse. What side effects may I notice from receiving this medication? Side effects that you should report to your doctor or health care professional as soon as possible: allergic reactions like skin rash, itching or hives, swelling of the face, lips, or tongue breathing problems fever, infection mouth sores unusual bleeding or bruising unusually weak or tired Side effects that usually do not require medical attention (report to your doctor or health care professional if they continue or are bothersome): constipation or diarrhea loss of appetite nausea, vomiting This list may not describe all possible side effects. Call your doctor for  medical advice about side effects. You may report side effects to FDA at 1-800-FDA-1088. Where should I keep my medication? This drug is given in a hospital or clinic and will not be stored at home. NOTE: This sheet is a summary. It may not cover all possible information. If you have questions about this medicine, talk to your doctor, pharmacist, or health care provider.  2022 [employer]/Gold Standard (2007-10-17 00:00:00)  Fluorouracil, 5-FU injection What is this medication? FLUOROURACIL, 5-FU (flure oh YOOR a sil) is a chemotherapy drug. It slows the growth of cancer cells. This medicine is used to treat many types of cancer like breast cancer, colon or rectal cancer, pancreatic cancer, and stomach cancer. This medicine may be used for other purposes; ask your health care provider or pharmacist if you have questions. COMMON BRAND NAME(S): Adrucil What should I tell my care team before I take this medication? They need to know if you have any of these conditions: blood disorders dihydropyrimidine dehydrogenase (DPD) deficiency infection (especially a virus infection such as chickenpox, cold  sores, or herpes) kidney disease liver disease malnourished, poor nutrition recent or ongoing radiation therapy an unusual or allergic reaction to fluorouracil, other chemotherapy, other medicines, foods, dyes, or preservatives pregnant or trying to get pregnant breast-feeding How should I use this medication? This drug is given as an infusion or injection into a vein. It is administered in a hospital or clinic by a specially trained health care professional. Talk to your pediatrician regarding the use of this medicine in children. Special care may be needed. Overdosage: If you think you have taken too much of this medicine contact a poison control center or emergency room at once. NOTE: This medicine is only for you. Do not share this medicine with others. What if I miss a dose? It is important not to miss your dose. Call your doctor or health care professional if you are unable to keep an appointment. What may interact with this medication? Do not take this medicine with any of the following medications: live virus vaccines This medicine may also interact with the following medications: medicines that treat or prevent blood clots like warfarin, enoxaparin, and dalteparin This list may not describe all possible interactions. Give your health care provider a list of all the medicines, herbs, non-prescription drugs, or dietary supplements you use. Also tell them if you smoke, drink alcohol, or use illegal drugs. Some items may interact with your medicine. What should I watch for while using this medication? Visit your doctor for checks on your progress. This drug may make you feel generally unwell. This is not uncommon, as chemotherapy can affect healthy cells as well as cancer cells. Report any side effects. Continue your course of treatment even though you feel ill unless your doctor tells you to stop. In some cases, you may be given additional medicines to help with side effects. Follow all  directions for their use. Call your doctor or health care professional for advice if you get a fever, chills or sore throat, or other symptoms of a cold or flu. Do not treat yourself. This drug decreases your body's ability to fight infections. Try to avoid being around people who are sick. This medicine may increase your risk to bruise or bleed. Call your doctor or health care professional if you notice any unusual bleeding. Be careful brushing and flossing your teeth or using a toothpick because you may get an infection or bleed more easily. If you have any dental work  done, tell your dentist you are receiving this medicine. Avoid taking products that contain aspirin, acetaminophen, ibuprofen, naproxen, or ketoprofen unless instructed by your doctor. These medicines may hide a fever. Do not become pregnant while taking this medicine. Women should inform their doctor if they wish to become pregnant or think they might be pregnant. There is a potential for serious side effects to an unborn child. Talk to your health care professional or pharmacist for more information. Do not breast-feed an infant while taking this medicine. Men should inform their doctor if they wish to father a child. This medicine may lower sperm counts. Do not treat diarrhea with over the counter products. Contact your doctor if you have diarrhea that lasts more than 2 days or if it is severe and watery. This medicine can make you more sensitive to the sun. Keep out of the sun. If you cannot avoid being in the sun, wear protective clothing and use sunscreen. Do not use sun lamps or tanning beds/booths. What side effects may I notice from receiving this medication? Side effects that you should report to your doctor or health care professional as soon as possible: allergic reactions like skin rash, itching or hives, swelling of the face, lips, or tongue low blood counts - this medicine may decrease the number of white blood cells, red  blood cells and platelets. You may be at increased risk for infections and bleeding. signs of infection - fever or chills, cough, sore throat, pain or difficulty passing urine signs of decreased platelets or bleeding - bruising, pinpoint red spots on the skin, black, tarry stools, blood in the urine signs of decreased red blood cells - unusually weak or tired, fainting spells, lightheadedness breathing problems changes in vision chest pain mouth sores nausea and vomiting pain, swelling, redness at site where injected pain, tingling, numbness in the hands or feet redness, swelling, or sores on hands or feet stomach pain unusual bleeding Side effects that usually do not require medical attention (report to your doctor or health care professional if they continue or are bothersome): changes in finger or toe nails diarrhea dry or itchy skin hair loss headache loss of appetite sensitivity of eyes to the light stomach upset unusually teary eyes This list may not describe all possible side effects. Call your doctor for medical advice about side effects. You may report side effects to FDA at 1-800-FDA-1088. Where should I keep my medication? This drug is given in a hospital or clinic and will not be stored at home. NOTE: This sheet is a summary. It may not cover all possible information. If you have questions about this medicine, talk to your doctor, pharmacist, or health care provider.  2022 [employer]/Gold Standard (2020-12-28 00:00:00)

## 2021-03-07 NOTE — Progress Notes (Signed)
[facility] OFFICE PROGRESS NOTE   Diagnosis: Colon cancer  INTERVAL HISTORY:   John Vance returns as scheduled.  He completed cycle 1 FOLFOX 02/21/2021.  He denies nausea/vomiting.  He took scheduled Compazine due to fear of nausea.  No mouth sores.  No diarrhea.  No significant issues with cold sensitivity.  He noted appetite was diminished following chemotherapy.  Appetite is now better.  Objective:  Vital signs in last 24 hours:  Blood pressure (!) 158/67, pulse 80, temperature 98.1 F (36.7 C), temperature source Oral, resp. rate 20, height 5' 8"  (1.727 m), weight 171 lb 6.4 oz (77.7 kg), SpO2 100 %.   HEENT: No thrush or ulcers. Resp: Lungs clear bilaterally. Cardio: Regular rate and rhythm. GI: Abdomen soft and nontender.  No hepatomegaly. Vascular: No leg edema. Skin: Palms without erythema. Port-A-Cath without erythema.  Lab Results:  Lab Results  Component Value Date   WBC 4.0 03/07/2021   HGB 12.0 (L) 03/07/2021   HCT 37.9 (L) 03/07/2021   MCV 83.3 03/07/2021   PLT 287 03/07/2021   NEUTROABS 1.6 (L) 03/07/2021    Imaging:  No results found.  Medications: I have reviewed the patient's current medications.  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), transverse colon cancer stage IIB (T4aN0) status post extended right hemicolectomy 05/07/2019, MSS Tumor invasive superficial portion of the muscle ("internal third "), no tumor perforation, no vascular invasion or perineural invasion, negative resection margins, 0/4 lymph nodes Elevated CEA 02/10/2019 CTs 03/05/2019, compared to outside CTs from 11/22/2018-worsened wall thickening in the cecum stable apple core lesion in the mid transverse colon, no evidence of recurrent tumor at the distal colonic anastomosis, no evidence of metastatic disease Colonoscopy 03/24/2019 20-2  malignant appearing masses noted in the colon, 1 filled the cecum measuring approximately 4 cm across and friable.  The other malignant appearing mass was circumferential creating a stricture with a 1.2 cm lumen located in the transverse segment approximately 4 to 5 cm in length.  There were 12-18 neoplastic appearing polyps in between the 2 obvious malignant masses ranging in size from 3 mm to 2 cm.  5 polyps distal to the transverse colon mass.  2 sigmoid colon polyps.  Cecum mass positive for adenocarcinoma.  Transverse colon mass positive for adenocarcinoma. 05/07/2019 laparoscopic extended right hemicolectomy, lysis of adhesions-5 cm invasive moderately differentiated adenocarcinoma involving cecum and proximal ascending colon, carcinoma invades into the pericolonic soft tissue; 7 cm invasive moderately differentiated carcinoma involving the transverse colon, carcinoma invades into the serosal surface (visceral peritoneum); all resection margins negative for carcinoma.  Lymphovascular invasion present.  In the proximal colon metastatic carcinoma involves 3 of 17 lymph nodes with 1 tumor deposit.  In the transverse colon 12 lymph nodes negative for metastatic carcinoma.  3 separate polyps: Tubular adenoma, inflammatory polyp and hyperplastic polyp Cycle 1 capecitabine 06/02/2019 Cycle 2 capecitabine 06/23/2019 Cycle 3 capecitabine 07/14/2019 Cycle 4 capecitabine 08/04/2019 Cycle 5 capecitabine 08/25/2019 Cycle 6 capecitabine 09/15/2019 Cycle 7 capecitabine 10/06/2019 Cycle 8 capecitabine 10/27/2019 Upper endoscopy 07/23/2020-negative Colonoscopy 07/23/2020-normal-appearing anastomoses, polyp removed from the descending colon-tubular adenoma CTs 01/18/2021-right abdominal mesenteric mass, mass in the low anatomic pelvis consistent with metastases Cycle 1 FOLFOX 02/21/2021 Cycle 2 FOLFOX 03/07/2021, dose reductions made due to mild neutropenia 2.   Microcytic anemia secondary to #1.    Resolved 3.   Family history  of pancreas and colon cancer, negative  genetic testing per the [facility] panel, ATM variant of unknown significance    Disposition: John Vance appears stable.  He has completed 1 cycle of FOLFOX, overall tolerated well.  Plan to proceed with cycle 2 today as scheduled.  Oxaliplatin will be dose reduced due to mild neutropenia.  Precautions reviewed.  He understands to contact the office with fever, chills, other signs of infection.  CBC and chemistry panel reviewed.  Labs adequate to proceed with treatment.  Oxaliplatin dose reduced as above.  He will return for lab, follow-up, cycle 3 FOLFOX in 2 weeks.  He will contact the office in the interim with any problems.  Ned Card ANP/GNP-BC   03/07/2021  10:25 AM

## 2021-03-07 NOTE — Progress Notes (Signed)
Patient presents for treatment. RN assessment completed along with the following:  Labs/vitals reviewed - Yes, and ok to treat with anc 1.6 per Lattie Haw T,NP    Weight within 10% of previous measurement - Yes Oncology Treatment Attestation completed for current therapy- Yes, on date 02/09/21 Informed consent completed and reflects current therapy/intent - Yes, on date 02/21/21             Provider progress note reviewed - Today's provider note is not yet available. I reviewed the most recent oncology provider progress note in chart dated 02/21/21. Treatment/Antibody/Supportive plan reviewed - Yes, and adjustment to oxaliplatin S&H and other orders reviewed - Yes, and flu shot ordered Previous treatment date reviewed - Yes, and the appropriate amount of time has elapsed between treatments. Clinic Hand Off Received from - Leander Rams, NP  Patient to proceed with treatment.

## 2021-03-09 ENCOUNTER — Inpatient Hospital Stay: Payer: Self-pay

## 2021-03-09 ENCOUNTER — Other Ambulatory Visit: Payer: Self-pay

## 2021-03-09 VITALS — BP 151/70 | HR 73 | Temp 98.8°F | Resp 18

## 2021-03-09 DIAGNOSIS — C189 Malignant neoplasm of colon, unspecified: Secondary | ICD-10-CM

## 2021-03-09 MED ORDER — SODIUM CHLORIDE 0.9% FLUSH
10.0000 mL | INTRAVENOUS | Status: DC | PRN
  Administered 2021-03-09: 10 mL

## 2021-03-09 MED ORDER — HEPARIN SOD (PORK) LOCK FLUSH 100 UNIT/ML IV SOLN
500.0000 [IU] | Freq: Once | INTRAVENOUS | Status: AC | PRN
  Administered 2021-03-09: 500 [IU]

## 2021-03-20 ENCOUNTER — Other Ambulatory Visit: Payer: Self-pay | Admitting: Oncology

## 2021-03-21 ENCOUNTER — Inpatient Hospital Stay: Payer: Self-pay

## 2021-03-21 ENCOUNTER — Telehealth: Payer: Self-pay

## 2021-03-21 ENCOUNTER — Inpatient Hospital Stay (HOSPITAL_BASED_OUTPATIENT_CLINIC_OR_DEPARTMENT_OTHER): Payer: Self-pay | Admitting: Oncology

## 2021-03-21 ENCOUNTER — Other Ambulatory Visit: Payer: Self-pay

## 2021-03-21 VITALS — BP 145/62 | HR 78 | Temp 97.8°F | Resp 20 | Ht 68.0 in | Wt 171.0 lb

## 2021-03-21 DIAGNOSIS — C189 Malignant neoplasm of colon, unspecified: Secondary | ICD-10-CM

## 2021-03-21 DIAGNOSIS — Z95828 Presence of other vascular implants and grafts: Secondary | ICD-10-CM

## 2021-03-21 LAB — CBC WITH DIFFERENTIAL (CANCER CENTER ONLY)
Abs Immature Granulocytes: 0.01 10*3/uL (ref 0.00–0.07)
Basophils Absolute: 0 10*3/uL (ref 0.0–0.1)
Basophils Relative: 1 %
Eosinophils Absolute: 0.1 10*3/uL (ref 0.0–0.5)
Eosinophils Relative: 3 %
HCT: 39.8 % (ref 39.0–52.0)
Hemoglobin: 12.6 g/dL — ABNORMAL LOW (ref 13.0–17.0)
Immature Granulocytes: 0 %
Lymphocytes Relative: 30 %
Lymphs Abs: 1.2 10*3/uL (ref 0.7–4.0)
MCH: 26.5 pg (ref 26.0–34.0)
MCHC: 31.7 g/dL (ref 30.0–36.0)
MCV: 83.6 fL (ref 80.0–100.0)
Monocytes Absolute: 0.6 10*3/uL (ref 0.1–1.0)
Monocytes Relative: 14 %
Neutro Abs: 2.2 10*3/uL (ref 1.7–7.7)
Neutrophils Relative %: 52 %
Platelet Count: 130 10*3/uL — ABNORMAL LOW (ref 150–400)
RBC: 4.76 MIL/uL (ref 4.22–5.81)
RDW: 15.6 % — ABNORMAL HIGH (ref 11.5–15.5)
WBC Count: 4.1 10*3/uL (ref 4.0–10.5)
nRBC: 0 % (ref 0.0–0.2)

## 2021-03-21 LAB — CMP (CANCER CENTER ONLY)
ALT: 17 U/L (ref 0–44)
AST: 22 U/L (ref 15–41)
Albumin: 4 g/dL (ref 3.5–5.0)
Alkaline Phosphatase: 69 U/L (ref 38–126)
Anion gap: 8 (ref 5–15)
BUN: 19 mg/dL (ref 8–23)
CO2: 25 mmol/L (ref 22–32)
Calcium: 9.7 mg/dL (ref 8.9–10.3)
Chloride: 106 mmol/L (ref 98–111)
Creatinine: 0.83 mg/dL (ref 0.61–1.24)
GFR, Estimated: >60 mL/min (ref >60)
Glucose, Bld: 147 mg/dL — ABNORMAL HIGH (ref 70–99)
Potassium: 3.8 mmol/L (ref 3.5–5.1)
Sodium: 139 mmol/L (ref 135–145)
Total Bilirubin: 0.4 mg/dL (ref 0.3–1.2)
Total Protein: 6.8 g/dL (ref 6.5–8.1)

## 2021-03-21 LAB — CEA (ACCESS): CEA (CHCC): 5.03 ng/mL — ABNORMAL HIGH (ref 0.00–5.00)

## 2021-03-21 MED ORDER — OXALIPLATIN CHEMO INJECTION 100 MG/20ML
65.0000 mg/m2 | Freq: Once | INTRAVENOUS | Status: AC
  Administered 2021-03-21: 12:00:00 125 mg via INTRAVENOUS
  Filled 2021-03-21: qty 20

## 2021-03-21 MED ORDER — LEUCOVORIN CALCIUM INJECTION 350 MG
300.0000 mg/m2 | Freq: Once | INTRAVENOUS | Status: AC
  Administered 2021-03-21: 12:00:00 580 mg via INTRAVENOUS
  Filled 2021-03-21: qty 29

## 2021-03-21 MED ORDER — SODIUM CHLORIDE 0.9 % IV SOLN
2000.0000 mg/m2 | INTRAVENOUS | Status: DC
  Administered 2021-03-21: 14:00:00 3850 mg via INTRAVENOUS
  Filled 2021-03-21: qty 77

## 2021-03-21 MED ORDER — SODIUM CHLORIDE 0.9 % IV SOLN
10.0000 mg | Freq: Once | INTRAVENOUS | Status: AC
  Administered 2021-03-21: 11:00:00 10 mg via INTRAVENOUS
  Filled 2021-03-21: qty 1

## 2021-03-21 MED ORDER — FLUOROURACIL CHEMO INJECTION 2.5 GM/50ML
300.0000 mg/m2 | Freq: Once | INTRAVENOUS | Status: AC
  Administered 2021-03-21: 14:00:00 600 mg via INTRAVENOUS
  Filled 2021-03-21: qty 12

## 2021-03-21 MED ORDER — DEXTROSE 5 % IV SOLN: Freq: Once | INTRAVENOUS | Status: AC

## 2021-03-21 MED ORDER — PALONOSETRON HCL INJECTION 0.25 MG/5ML
0.2500 mg | Freq: Once | INTRAVENOUS | Status: AC
  Administered 2021-03-21: 11:00:00 0.25 mg via INTRAVENOUS
  Filled 2021-03-21: qty 5

## 2021-03-21 MED ORDER — SODIUM CHLORIDE 0.9% FLUSH
10.0000 mL | INTRAVENOUS | Status: DC | PRN
  Administered 2021-03-21: 09:00:00 10 mL via INTRAVENOUS

## 2021-03-21 NOTE — Telephone Encounter (Signed)
Patient seen by Dr. Sherrill today ? ?Vitals are within treatment parameters. ? ?Labs reviewed by Dr. Sherrill and are within treatment parameters. ? ?Per physician team, patient is ready for treatment and there are NO modifications to the treatment plan.  ?

## 2021-03-21 NOTE — Patient Instructions (Signed)
[facility]  Discharge Instructions: Thank you for choosing [facility] to provide your oncology and hematology care.   If you have a lab appointment with the [facility], please go directly to the [facility] and check in at the registration area.   Wear comfortable clothing and clothing appropriate for easy access to any Portacath or PICC line.   We strive to give you quality time with your provider. You may need to reschedule your appointment if you arrive late (15 or more minutes).  Arriving late affects you and other patients whose appointments are after yours.  Also, if you miss three or more appointments without notifying the office, you may be dismissed from the clinic at the provider's discretion.      For prescription refill requests, have your pharmacy contact our office and allow 72 hours for refills to be completed.    Today you received the following chemotherapy and/or immunotherapy agents Oxaliplatin, Leucovorin and Adrucil      To help prevent nausea and vomiting after your treatment, we encourage you to take your nausea medication as directed.  BELOW ARE SYMPTOMS THAT SHOULD BE REPORTED IMMEDIATELY: *FEVER GREATER THAN 100.4 F (38 C) OR HIGHER *CHILLS OR SWEATING *NAUSEA AND VOMITING THAT IS NOT CONTROLLED WITH YOUR NAUSEA MEDICATION *UNUSUAL SHORTNESS OF BREATH *UNUSUAL BRUISING OR BLEEDING *URINARY PROBLEMS (pain or burning when urinating, or frequent urination) *BOWEL PROBLEMS (unusual diarrhea, constipation, pain near the anus) TENDERNESS IN MOUTH AND THROAT WITH OR WITHOUT PRESENCE OF ULCERS (sore throat, sores in mouth, or a toothache) UNUSUAL RASH, SWELLING OR PAIN  UNUSUAL VAGINAL DISCHARGE OR ITCHING   Items with * indicate a potential emergency and should be followed up as soon as possible or go to the Emergency Department if any problems should occur.  Please show the CHEMOTHERAPY ALERT CARD or IMMUNOTHERAPY  ALERT CARD at check-in to the Emergency Department and triage nurse.  Should you have questions after your visit or need to cancel or reschedule your appointment, please contact [facility]  Dept: [phone]  and follow the prompts.  Office hours are 8:00 a.m. to 4:30 p.m. Monday - Friday. Please note that voicemails left after 4:00 p.m. may not be returned until the following business day.  We are closed weekends and major holidays. You have access to a nurse at all times for urgent questions. Please call the main number to the clinic Dept: [phone] and follow the prompts.   For any non-urgent questions, you may also contact your provider using MyChart. We now offer e-Visits for anyone 82 and older to request care online for non-urgent symptoms. For details visit [email or web address].   Also download the MyChart app! Go to the app store, search "MyChart", open the app, select [facility], and log in with your MyChart username and password.  Due to Covid, a mask is required upon entering the hospital/clinic. If you do not have a mask, one will be given to you upon arrival. For doctor visits, patients may have 1 support person aged 25 or older with them. For treatment visits, patients cannot have anyone with them due to current Covid guidelines and our immunocompromised population.

## 2021-03-21 NOTE — Progress Notes (Signed)
[facility] OFFICE PROGRESS NOTE   Diagnosis: Colon cancer  INTERVAL HISTORY:   [name] completed another cycle of FOLFOX on 03/07/2021.  No nausea/vomiting, mouth sores, or diarrhea.  He reports cold sensitivity lasting approxi-1 week following chemotherapy.  No neuropathy symptoms at present.  He feels well.  Objective:  Vital signs in last 24 hours:  Blood pressure (!) 145/62, pulse 78, temperature 97.8 F (36.6 C), temperature source Oral, resp. rate 20, height 5' 8"  (1.727 m), weight 171 lb (77.6 kg), SpO2 100 %.    HEENT: No thrush or ulcers Resp: Lungs clear bilaterally Cardio: Regular rate and rhythm GI: No hepatosplenomegaly Vascular: No leg edema  Skin: Palms without erythema  Portacath/PICC-without erythema  Lab Results:  Lab Results  Component Value Date   WBC 4.1 03/21/2021   HGB 12.6 (L) 03/21/2021   HCT 39.8 03/21/2021   MCV 83.6 03/21/2021   PLT 130 (L) 03/21/2021   NEUTROABS 2.2 03/21/2021    CMP  Lab Results  Component Value Date   NA 138 03/07/2021   K 3.9 03/07/2021   CL 104 03/07/2021   CO2 25 03/07/2021   GLUCOSE 115 (H) 03/07/2021   BUN 16 03/07/2021   CREATININE 0.98 03/07/2021   CALCIUM 9.6 03/07/2021   PROT 7.3 03/07/2021   ALBUMIN 3.7 03/07/2021   AST 21 03/07/2021   ALT 25 03/07/2021   ALKPHOS 69 03/07/2021   BILITOT 0.3 03/07/2021   GFRNONAA >60 03/07/2021   GFRAA >60 12/01/2019    Lab Results  Component Value Date   CEA1 12.10 (H) 01/03/2021   CEA 24.37 (H) 02/21/2021    Lab Results  Component Value Date   INR 1.0 05/02/2019   LABPROT 13.2 05/02/2019    Imaging:  No results found.  Medications: I have reviewed the patient's current medications.   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), transverse colon cancer stage IIB (T4aN0) status post extended right hemicolectomy  05/07/2019, MSS Tumor invasive superficial portion of the muscle ("internal third "), no tumor perforation, no vascular invasion or perineural invasion, negative resection margins, 0/4 lymph nodes Elevated CEA 02/10/2019 CTs 03/05/2019, compared to outside CTs from 11/22/2018-worsened wall thickening in the cecum stable apple core lesion in the mid transverse colon, no evidence of recurrent tumor at the distal colonic anastomosis, no evidence of metastatic disease Colonoscopy 03/24/2019 20-2 malignant appearing masses noted in the colon, 1 filled the cecum measuring approximately 4 cm across and friable.  The other malignant appearing mass was circumferential creating a stricture with a 1.2 cm lumen located in the transverse segment approximately 4 to 5 cm in length.  There were 12-18 neoplastic appearing polyps in between the 2 obvious malignant masses ranging in size from 3 mm to 2 cm.  5 polyps distal to the transverse colon mass.  2 sigmoid colon polyps.  Cecum mass positive for adenocarcinoma.  Transverse colon mass positive for adenocarcinoma. 05/07/2019 laparoscopic extended right hemicolectomy, lysis of adhesions-5 cm invasive moderately differentiated adenocarcinoma involving cecum and proximal ascending colon, carcinoma invades into the pericolonic soft tissue; 7 cm invasive moderately differentiated carcinoma involving the transverse colon, carcinoma invades into the serosal surface (visceral peritoneum); all resection margins negative for carcinoma.  Lymphovascular invasion present.  In the proximal colon metastatic carcinoma involves 3 of 17 lymph nodes with 1 tumor deposit.  In the transverse colon 12 lymph nodes negative for metastatic carcinoma.  3 separate polyps: Tubular adenoma, inflammatory polyp and hyperplastic polyp Cycle 1 capecitabine 06/02/2019 Cycle 2 capecitabine 06/23/2019 Cycle 3 capecitabine 07/14/2019 Cycle 4 capecitabine 08/04/2019 Cycle 5 capecitabine 08/25/2019 Cycle 6 capecitabine  09/15/2019 Cycle 7 capecitabine 10/06/2019 Cycle 8 capecitabine 10/27/2019 Upper endoscopy 07/23/2020-negative Colonoscopy 07/23/2020-normal-appearing anastomoses, polyp removed from the descending colon-tubular adenoma CTs 01/18/2021-right abdominal mesenteric mass, mass in the low anatomic pelvis consistent with metastases Cycle 1 FOLFOX 02/21/2021 Cycle 2 FOLFOX 03/07/2021, dose reductions made due to mild neutropenia Cycle 3 FOLFOX 03/21/2021 2.   Microcytic anemia secondary to #1.    Resolved 3.   Family history of pancreas and colon cancer, negative genetic testing per the [facility] panel, ATM variant of unknown significance      Disposition: [name] has completed 2 cycles of FOLFOX.  He has tolerated the chemotherapy well.  He will complete cycle 4 today.  [name].  We will follow-up on the CEA from today.  Betsy Coder, MD  03/21/2021  9:49 AM

## 2021-03-21 NOTE — Patient Instructions (Signed)

## 2021-03-21 NOTE — Progress Notes (Signed)
Patient presents for treatment. RN assessment completed along with the following:  Labs/vitals reviewed - Yes, and within treatment parameters.   Weight within 10% of previous measurement - Yes Oncology Treatment Attestation completed for current therapy- Yes, on date 02/09/21 Informed consent completed and reflects current therapy/intent - Yes, on date 02/21/21             Provider progress note reviewed - Yes, today's provider note was reviewed. Treatment/Antibody/Supportive plan reviewed - Yes, and there are no adjustments needed for today's treatment. S&H and other orders reviewed - Yes, and there are no additional orders identified. Previous treatment date reviewed - Yes, and the appropriate amount of time has elapsed between treatments. Clinic Hand Off Received from - [facility] RN note entered  Patient to proceed with treatment.

## 2021-03-23 ENCOUNTER — Other Ambulatory Visit: Payer: Self-pay

## 2021-03-23 ENCOUNTER — Inpatient Hospital Stay: Payer: Self-pay

## 2021-03-23 VITALS — BP 158/65 | HR 70 | Temp 98.1°F | Resp 20

## 2021-03-23 DIAGNOSIS — C189 Malignant neoplasm of colon, unspecified: Secondary | ICD-10-CM

## 2021-03-23 MED ORDER — SODIUM CHLORIDE 0.9% FLUSH
10.0000 mL | INTRAVENOUS | Status: DC | PRN
  Administered 2021-03-23: 10 mL

## 2021-03-23 MED ORDER — HEPARIN SOD (PORK) LOCK FLUSH 100 UNIT/ML IV SOLN
500.0000 [IU] | Freq: Once | INTRAVENOUS | Status: AC | PRN
  Administered 2021-03-23: 500 [IU]

## 2021-03-23 NOTE — Patient Instructions (Addendum)
[facility]  Discharge Instructions: Thank you for choosing [facility] to provide your oncology and hematology care.   If you have a lab appointment with the [facility], please go directly to the [facility] and check in at the registration area.   Wear comfortable clothing and clothing appropriate for easy access to any Portacath or PICC line.   We strive to give you quality time with your provider. You may need to reschedule your appointment if you arrive late (15 or more minutes).  Arriving late affects you and other patients whose appointments are after yours.  Also, if you miss three or more appointments without notifying the office, you may be dismissed from the clinic at the provider's discretion.      For prescription refill requests, have your pharmacy contact our office and allow 72 hours for refills to be completed.    Today you received the following chemotherapy and/or immunotherapy agents 39fu       To help prevent nausea and vomiting after your treatment, we encourage you to take your nausea medication as directed.  BELOW ARE SYMPTOMS THAT SHOULD BE REPORTED IMMEDIATELY: *FEVER GREATER THAN 100.4 F (38 C) OR HIGHER *CHILLS OR SWEATING *NAUSEA AND VOMITING THAT IS NOT CONTROLLED WITH YOUR NAUSEA MEDICATION *UNUSUAL SHORTNESS OF BREATH *UNUSUAL BRUISING OR BLEEDING *URINARY PROBLEMS (pain or burning when urinating, or frequent urination) *BOWEL PROBLEMS (unusual diarrhea, constipation, pain near the anus) TENDERNESS IN MOUTH AND THROAT WITH OR WITHOUT PRESENCE OF ULCERS (sore throat, sores in mouth, or a toothache) UNUSUAL RASH, SWELLING OR PAIN  UNUSUAL VAGINAL DISCHARGE OR ITCHING   Items with * indicate a potential emergency and should be followed up as soon as possible or go to the Emergency Department if any problems should occur.  Please show the CHEMOTHERAPY ALERT CARD or IMMUNOTHERAPY ALERT CARD at check-in to the  Emergency Department and triage nurse.  Should you have questions after your visit or need to cancel or reschedule your appointment, please contact [facility]  Dept: [phone]  and follow the prompts.  Office hours are 8:00 a.m. to 4:30 p.m. Monday - Friday. Please note that voicemails left after 4:00 p.m. may not be returned until the following business day.  We are closed weekends and major holidays. You have access to a nurse at all times for urgent questions. Please call the main number to the clinic Dept: [phone] and follow the prompts.   For any non-urgent questions, you may also contact your provider using MyChart. We now offer e-Visits for anyone 26 and older to request care online for non-urgent symptoms. For details visit [email or web address].   Also download the MyChart app! Go to the app store, search "MyChart", open the app, select [facility], and log in with your MyChart username and password.  Due to Covid, a mask is required upon entering the hospital/clinic. If you do not have a mask, one will be given to you upon arrival. For doctor visits, patients may have 1 support person aged 73 or older with them. For treatment visits, patients cannot have anyone with them due to current Covid guidelines and our immunocompromised population.    Fluorouracil, 5-FU injection Qu es este medicamento? [place], 5-FU es un agente quimioteraputico. Desacelera el crecimiento de clulas cancerosas. Este medicamento se [place] para tratar muchos tipos de cncer, tales como cncer de mama, cncer de colon o rectal, cncer de pncreas y cncer de estmago.  Este medicamento puede ser utilizado para otros usos; si tiene alguna pregunta consulte con su proveedor de atencin mdica o con su farmacutico. MARCAS COMUNES: Adrucil Qu le debo informar a mi profesional de la salud antes de tomar este medicamento? Necesitan saber si usted presenta alguno de los  siguientes problemas o situaciones: trastornos sanguneos deficiencia de dihidropirimidina deshidrogenasa (DPD) infeccin (especialmente infecciones virales, como varicela, fuegos labiales o herpes) enfermedad renal enfermedad heptica desnutricin, nutricin deficiente terapia de radiacin en curso o reciente una reaccin alrgica o inusual al fluorouracilo, a otros medicamentos quimioteraputicos, a otros medicamentos, alimentos, colorantes o conservantes si est embarazada o buscando quedar embarazada si est amamantando a un beb Cmo debo utilizar este medicamento? Este medicamento se administra como infusin o inyeccin en una vena. Un profesional de la salud especialmente capacitado lo administra en un hospital o clnica. Hable con su pediatra para informarse acerca del uso de este medicamento en nios. Puede requerir atencin especial. Sobredosis: Pngase en contacto inmediatamente con un centro toxicolgico o una sala de urgencia si usted cree que haya tomado demasiado medicamento. ATENCIN: [employer] es solo para usted. No comparta este medicamento con nadie. Qu sucede si me olvido de una dosis? Es importante no olvidar ninguna dosis. Informe a su mdico o a su profesional de la salud si no puede asistir a Photographer. Qu puede interactuar con este medicamento? No use este medicamento con ninguno de los siguientes frmacos: vacunas de virus vivos Este medicamento tambin puede interactuar con los siguientes frmacos: medicamentos que tratan o previenen cogulos sanguneos, tales como warfarina, enoxaparina y dalteparina Puede ser que esta lista no menciona todas las posibles interacciones. Informe a su profesional de [employer]	[place] de [employer] productos a base de hierbas, medicamentos de [place] o suplementos nutritivos que est tomando. Si usted fuma, consume bebidas alcohlicas o si utiliza drogas ilegales, indqueselo tambin a su profesional de [employer]	[place]. Algunas  sustancias pueden interactuar con su medicamento. A qu debo estar atento al usar [employer]? Visite a su mdico para que revise su evolucin. Este medicamento podra hacerle sentir un Nurse, mental health. Esto es normal, ya que la quimioterapia puede afectar tanto a las clulas sanas como a las clulas cancerosas. Si presenta algn efecto secundario, infrmelo. Contine con el tratamiento aun si se siente enfermo, a menos que su mdico le indique que lo suspenda. En algunos casos, podra recibir [employer] para ayudarlo con los efectos secundarios. Siga todas las instrucciones para usarlos. Llame a su mdico o a su profesional de la salud si tiene fiebre, escalofros o dolor de garganta, o cualquier otro sntoma de resfro o gripe. No se trate usted mismo. Este medicamento reduce la capacidad del cuerpo para combatir infecciones. Trate de no acercarse a personas que estn enfermas. Este medicamento podra aumentar el riesgo de moretones o sangrado. Consulte a su mdico o a su profesional de la salud si observa sangrados inusuales. Proceda con cuidado al cepillar sus dientes, usar hilo dental o Risk manager palillos para los dientes, ya que podra contraer una infeccin o Therapist, art con mayor facilidad. Si se somete a algn tratamiento dental, informe a su dentista que est [employer]. Evite usar productos que contienen aspirina, acetaminofeno, ibuprofeno, naproxeno o ketoprofeno, a menos que as lo indique su mdico. Estos productos pueden ocultar la fiebre. No debe quedar embarazada mientras est [employer]. Las mujeres deben informar a su mdico si estn buscando quedar embarazadas o si creen que podran estar embarazadas. Existe la posibilidad  de efectos secundarios graves en un beb sin nacer. Para obtener ms informacin, hable con su profesional de la salud o su farmacutico. No debe Economist a un beb mientras est usando este medicamento. Los hombres deben  informar a su mdico si quieren tener hijos. Este medicamento puede reducir el recuento de esperma. No trate la diarrea con productos de [employer]. Contacte a su mdico si tiene diarrea por ms de 2 das, o si es grave y [place]. Este medicamento puede aumentar su sensibilidad al sol. Evite la [employer]. Si no la Product manager, utilice ropa protectora y crema de Photographer. No utilice lmparas solares, camas solares ni cabinas solares. Qu efectos secundarios puedo tener al [employer] este medicamento? Efectos secundarios que debe informar a su mdico o a Barrister's clerk de la salud tan pronto como sea posible: Chief of Staff, tales como erupcin cutnea, comezn/picazn o urticaria, e hinchazn de la cara, los labios o la lengua recuentos sanguneos bajos: este medicamento podra reducir la cantidad de glbulos blancos, glbulos rojos y plaquetas. Su riesgo de infeccin y sangrado podra ser mayor. signos de infeccin: fiebre o escalofros, tos, dolor de garganta, dolor o dificultad para orinar signos de disminucin en la cantidad de plaquetas o sangrado: moretones, puntos rojos en la piel, heces de color negro y aspecto alquitranado, sangre en la orina signos de disminucin en la cantidad de glbulos rojos: debilidad o cansancio inusuales, Youth worker, aturdimiento problemas para respirar cambios en la visin Market researcher en la boca nuseas y vmito dolor, hinchazn, enrojecimiento en TEFL teacher de Air cabin crew, hormigueo o entumecimiento de las manos o los pies enrojecimiento, hinchazn o Pension scheme manager en las manos o los pies dolor estomacal sangrado inusual Efectos secundarios que generalmente no requieren atencin mdica (infrmelos a su mdico o a su profesional de la salud si persisten o si son molestos): cambios en las uas de los pies o de las manos diarrea comezn/picazn o sequedad de la piel cada del cabello dolor de cabeza prdida del apetito sensibilidad de  los ojos a la luz Tree surgeon estomacal ojos inusualmente llorosos Puede ser que esta lista no menciona todos los posibles efectos secundarios. Comunquese a su mdico por asesoramiento mdico [employer]. Usted puede informar los efectos secundarios a la FDA por telfono al 1-800-FDA-1088. Dnde debo guardar mi medicina? Este medicamento se administra en hospitales o clnicas, y no necesitar guardarlo en su domicilio. ATENCIN: Este folleto es un resumen. Puede ser que no cubra toda la posible informacin. Si usted tiene preguntas acerca de esta medicina, consulte con su mdico, su farmacutico o su profesional de Technical sales engineer.  2022 Elsevier/Gold Standard (2019-08-14 00:00:00)       To help prevent nausea and vomiting after your treatment, we encourage you to take your nausea medication as directed.  BELOW ARE SYMPTOMS THAT SHOULD BE REPORTED IMMEDIATELY: *FEVER GREATER THAN 100.4 F (38 C) OR HIGHER *CHILLS OR SWEATING *NAUSEA AND VOMITING THAT IS NOT CONTROLLED WITH YOUR NAUSEA MEDICATION *UNUSUAL SHORTNESS OF BREATH *UNUSUAL BRUISING OR BLEEDING *URINARY PROBLEMS (pain or burning when urinating, or frequent urination) *BOWEL PROBLEMS (unusual diarrhea, constipation, pain near the anus) TENDERNESS IN MOUTH AND THROAT WITH OR WITHOUT PRESENCE OF ULCERS (sore throat, sores in mouth, or a toothache) UNUSUAL RASH, SWELLING OR PAIN  UNUSUAL VAGINAL DISCHARGE OR ITCHING   Items with * indicate a potential emergency and should be followed up as soon as possible or go to the Emergency Department if any problems should  occur.  Please show the CHEMOTHERAPY ALERT CARD or IMMUNOTHERAPY ALERT CARD at check-in to the Emergency Department and triage nurse.  Should you have questions after your visit or need to cancel or reschedule your appointment, please contact [facility]  Dept: [phone]  and follow the prompts.  Office hours are 8:00 a.m. to 4:30  p.m. Monday - Friday. Please note that voicemails left after 4:00 p.m. may not be returned until the following business day.  We are closed weekends and major holidays. You have access to a nurse at all times for urgent questions. Please call the main number to the clinic Dept: [phone] and follow the prompts.   For any non-urgent questions, you may also contact your provider using MyChart. We now offer e-Visits for anyone 37 and older to request care online for non-urgent symptoms. For details visit [email or web address].   Also download the MyChart app! Go to the app store, search "MyChart", open the app, select [facility], and log in with your MyChart username and password.  Due to Covid, a mask is required upon entering the hospital/clinic. If you do not have a mask, one will be given to you upon arrival. For doctor visits, patients may have 1 support person aged 82 or older with them. For treatment visits, patients cannot have anyone with them due to current Covid guidelines and our immunocompromised population.

## 2021-04-02 ENCOUNTER — Other Ambulatory Visit: Payer: Self-pay | Admitting: Oncology

## 2021-04-04 ENCOUNTER — Inpatient Hospital Stay: Payer: Self-pay

## 2021-04-04 ENCOUNTER — Other Ambulatory Visit: Payer: Self-pay

## 2021-04-04 ENCOUNTER — Encounter: Payer: Self-pay | Admitting: Nurse Practitioner

## 2021-04-04 ENCOUNTER — Inpatient Hospital Stay: Payer: Self-pay | Attending: Oncology | Admitting: Nurse Practitioner

## 2021-04-04 VITALS — BP 142/64 | HR 75 | Temp 97.8°F | Resp 18 | Ht 68.0 in | Wt 171.0 lb

## 2021-04-04 VITALS — BP 147/60 | HR 63

## 2021-04-04 DIAGNOSIS — C189 Malignant neoplasm of colon, unspecified: Secondary | ICD-10-CM

## 2021-04-04 DIAGNOSIS — C184 Malignant neoplasm of transverse colon: Secondary | ICD-10-CM | POA: Insufficient documentation

## 2021-04-04 DIAGNOSIS — C182 Malignant neoplasm of ascending colon: Secondary | ICD-10-CM | POA: Insufficient documentation

## 2021-04-04 DIAGNOSIS — C187 Malignant neoplasm of sigmoid colon: Secondary | ICD-10-CM | POA: Insufficient documentation

## 2021-04-04 DIAGNOSIS — Z5111 Encounter for antineoplastic chemotherapy: Secondary | ICD-10-CM | POA: Insufficient documentation

## 2021-04-04 LAB — CBC WITH DIFFERENTIAL (CANCER CENTER ONLY)
Abs Immature Granulocytes: 0.01 K/uL (ref 0.00–0.07)
Basophils Absolute: 0 K/uL (ref 0.0–0.1)
Basophils Relative: 0 %
Eosinophils Absolute: 0.1 K/uL (ref 0.0–0.5)
Eosinophils Relative: 2 %
HCT: 39.7 % (ref 39.0–52.0)
Hemoglobin: 12.8 g/dL — ABNORMAL LOW (ref 13.0–17.0)
Immature Granulocytes: 0 %
Lymphocytes Relative: 27 %
Lymphs Abs: 1.2 K/uL (ref 0.7–4.0)
MCH: 27 pg (ref 26.0–34.0)
MCHC: 32.2 g/dL (ref 30.0–36.0)
MCV: 83.8 fL (ref 80.0–100.0)
Monocytes Absolute: 0.7 K/uL (ref 0.1–1.0)
Monocytes Relative: 16 %
Neutro Abs: 2.5 K/uL (ref 1.7–7.7)
Neutrophils Relative %: 55 %
Platelet Count: 117 K/uL — ABNORMAL LOW (ref 150–400)
RBC: 4.74 MIL/uL (ref 4.22–5.81)
RDW: 16.9 % — ABNORMAL HIGH (ref 11.5–15.5)
WBC Count: 4.5 K/uL (ref 4.0–10.5)
nRBC: 0 % (ref 0.0–0.2)

## 2021-04-04 LAB — CMP (CANCER CENTER ONLY)
ALT: 18 U/L (ref 0–44)
AST: 20 U/L (ref 15–41)
Albumin: 3.8 g/dL (ref 3.5–5.0)
Alkaline Phosphatase: 71 U/L (ref 38–126)
Anion gap: 7 (ref 5–15)
BUN: 19 mg/dL (ref 8–23)
CO2: 25 mmol/L (ref 22–32)
Calcium: 9.1 mg/dL (ref 8.9–10.3)
Chloride: 107 mmol/L (ref 98–111)
Creatinine: 1 mg/dL (ref 0.61–1.24)
GFR, Estimated: >60 mL/min (ref >60)
Glucose, Bld: 113 mg/dL — ABNORMAL HIGH (ref 70–99)
Potassium: 3.9 mmol/L (ref 3.5–5.1)
Sodium: 139 mmol/L (ref 135–145)
Total Bilirubin: 0.5 mg/dL (ref 0.3–1.2)
Total Protein: 6.7 g/dL (ref 6.5–8.1)

## 2021-04-04 MED ORDER — SODIUM CHLORIDE 0.9 % IV SOLN
2000.0000 mg/m2 | INTRAVENOUS | Status: DC
  Administered 2021-04-04: 3850 mg via INTRAVENOUS
  Filled 2021-04-04: qty 77

## 2021-04-04 MED ORDER — LEUCOVORIN CALCIUM INJECTION 350 MG
300.0000 mg/m2 | Freq: Once | INTRAVENOUS | Status: AC
  Administered 2021-04-04: 580 mg via INTRAVENOUS
  Filled 2021-04-04: qty 29

## 2021-04-04 MED ORDER — DEXTROSE 5 % IV SOLN: Freq: Once | INTRAVENOUS | Status: AC

## 2021-04-04 MED ORDER — PALONOSETRON HCL INJECTION 0.25 MG/5ML
0.2500 mg | Freq: Once | INTRAVENOUS | Status: AC
  Administered 2021-04-04: 0.25 mg via INTRAVENOUS
  Filled 2021-04-04: qty 5

## 2021-04-04 MED ORDER — FLUOROURACIL CHEMO INJECTION 2.5 GM/50ML
300.0000 mg/m2 | Freq: Once | INTRAVENOUS | Status: AC
  Administered 2021-04-04: 600 mg via INTRAVENOUS
  Filled 2021-04-04: qty 12

## 2021-04-04 MED ORDER — OXALIPLATIN CHEMO INJECTION 100 MG/20ML
65.0000 mg/m2 | Freq: Once | INTRAVENOUS | Status: AC
  Administered 2021-04-04: 125 mg via INTRAVENOUS
  Filled 2021-04-04: qty 10

## 2021-04-04 MED ORDER — SODIUM CHLORIDE 0.9 % IV SOLN
10.0000 mg | Freq: Once | INTRAVENOUS | Status: AC
  Administered 2021-04-04: 10 mg via INTRAVENOUS
  Filled 2021-04-04: qty 1

## 2021-04-04 NOTE — Progress Notes (Signed)
[facility] OFFICE PROGRESS NOTE   Diagnosis: Colon cancer  INTERVAL HISTORY:   [name] returns as scheduled.  He completed cycle 3 FOLFOX 03/21/2021.  He denies nausea/vomiting.  No mouth sores.  No diarrhea.  He notes mild cold sensitivity which predated the chemotherapy.  No worsening.  No persistent neuropathy symptoms.  He denies pain.  He has a good appetite.  Objective:  Vital signs in last 24 hours:  Blood pressure (!) 142/64, pulse 75, temperature 97.8 F (36.6 C), temperature source Oral, resp. rate 18, height 5' 8"  (1.727 m), weight 171 lb (77.6 kg), SpO2 100 %.    HEENT: No thrush or ulcers. Resp: Lungs clear bilaterally. Cardio: Regular rate and rhythm. GI: Abdomen soft and nontender.  No hepatomegaly. Vascular: No leg edema. Neuro: Vibratory sense mildly decreased over the fingertips per tuning fork exam. Skin: Palms without erythema. Port-A-Cath without erythema.   Lab Results:  Lab Results  Component Value Date   WBC 4.5 04/04/2021   HGB 12.8 (L) 04/04/2021   HCT 39.7 04/04/2021   MCV 83.8 04/04/2021   PLT 117 (L) 04/04/2021   NEUTROABS 2.5 04/04/2021    Imaging:  No results found.  Medications: I have reviewed the patient's current medications.  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), transverse colon cancer stage IIB (T4aN0) status post extended right hemicolectomy 05/07/2019, MSS Tumor invasive superficial portion of the muscle ("internal third "), no tumor perforation, no vascular invasion or perineural invasion, negative resection margins, 0/4 lymph nodes Elevated CEA 02/10/2019 CTs 03/05/2019, compared to outside CTs from 11/22/2018-worsened wall thickening in the cecum stable apple core lesion in the mid transverse colon, no evidence of recurrent tumor at the distal colonic anastomosis, no evidence of metastatic  disease Colonoscopy 03/24/2019 20-2 malignant appearing masses noted in the colon, 1 filled the cecum measuring approximately 4 cm across and friable.  The other malignant appearing mass was circumferential creating a stricture with a 1.2 cm lumen located in the transverse segment approximately 4 to 5 cm in length.  There were 12-18 neoplastic appearing polyps in between the 2 obvious malignant masses ranging in size from 3 mm to 2 cm.  5 polyps distal to the transverse colon mass.  2 sigmoid colon polyps.  Cecum mass positive for adenocarcinoma.  Transverse colon mass positive for adenocarcinoma. 05/07/2019 laparoscopic extended right hemicolectomy, lysis of adhesions-5 cm invasive moderately differentiated adenocarcinoma involving cecum and proximal ascending colon, carcinoma invades into the pericolonic soft tissue; 7 cm invasive moderately differentiated carcinoma involving the transverse colon, carcinoma invades into the serosal surface (visceral peritoneum); all resection margins negative for carcinoma.  Lymphovascular invasion present.  In the proximal colon metastatic carcinoma involves 3 of 17 lymph nodes with 1 tumor deposit.  In the transverse colon 12 lymph nodes negative for metastatic carcinoma.  3 separate polyps: Tubular adenoma, inflammatory polyp and hyperplastic polyp Cycle 1 capecitabine 06/02/2019 Cycle 2 capecitabine 06/23/2019 Cycle 3 capecitabine 07/14/2019 Cycle 4 capecitabine 08/04/2019 Cycle 5 capecitabine 08/25/2019 Cycle 6 capecitabine 09/15/2019 Cycle 7 capecitabine 10/06/2019 Cycle 8 capecitabine 10/27/2019 Upper endoscopy 07/23/2020-negative Colonoscopy 07/23/2020-normal-appearing anastomoses, polyp removed from the descending colon-tubular adenoma CTs 01/18/2021-right abdominal mesenteric mass, mass in the low anatomic pelvis consistent with metastases Cycle 1 FOLFOX 02/21/2021 Cycle 2 FOLFOX 03/07/2021, dose reductions made due to mild neutropenia Cycle 3 FOLFOX 03/21/2021 Cycle 4  FOLFOX 04/04/2021 2.   Microcytic anemia secondary to #  1.    Resolved 3.   Family history of pancreas and colon cancer, negative genetic testing per the [facility] panel, ATM variant of unknown significance      Disposition: [name] appears stable.  He has completed 3 cycles of FOLFOX.  He continues to tolerate the chemotherapy well.  Plan to proceed with cycle 4 today as scheduled.  Restaging CTs after completing 6 cycles.  CBC and chemistry panel from today reviewed.  Labs adequate to proceed with treatment.  He will return for lab, follow-up, cycle 5 FOLFOX in 2 weeks.  We are available to see him sooner if needed.    Ned Card ANP/GNP-BC   04/04/2021  10:04 AM

## 2021-04-04 NOTE — Patient Instructions (Addendum)
[place]   The chemotherapy medication bag should finish at 46 hours, 96 hours, or 7 days. For example, if your pump is scheduled for 46 hours and it was put on at 4:00 p.m., it should finish at 2:00 p.m. the day it is scheduled to come off regardless of your appointment time.     Estimated time to finish at  12:30 Wednesday, April 06, 2021.   If the display on your pump reads "Low Volume" and it is beeping, take the batteries out of the pump and come to the cancer center for it to be taken off.   If the pump alarms go off prior to the pump reading "Low Volume" then call [phone] and someone can assist you.  If the plunger comes out and the chemotherapy medication is leaking out, please use your home chemo spill kit to clean up the spill. Do NOT use paper towels or other household products.  If you have problems or questions regarding your pump, please call either [phone] (24 hours a day) or the cancer center Monday-Friday 8:00 a.m.- 4:30 p.m. at the clinic number and we will assist you. If you are unable to get assistance, then go to the nearest Emergency Department and ask the staff to contact the IV team for assistance.  Discharge Instructions: Thank you for choosing [facility] to provide your oncology and hematology care.   If you have a lab appointment with the [facility], please go directly to the [facility] and check in at the registration area.   Wear comfortable clothing and clothing appropriate for easy access to any Portacath or PICC line.   We strive to give you quality time with your provider. You may need to reschedule your appointment if you arrive late (15 or more minutes).  Arriving late affects you and other patients whose appointments are after yours.  Also, if you miss three or more appointments without notifying the office, you may be dismissed from the clinic at the provider's discretion.      For  prescription refill requests, have your pharmacy contact our office and allow 72 hours for refills to be completed.    Today you received the following chemotherapy and/or immunotherapy agents Oxaliplatin, leucovorin, fluorouracil      To help prevent nausea and vomiting after your treatment, we encourage you to take your nausea medication as directed.  BELOW ARE SYMPTOMS THAT SHOULD BE REPORTED IMMEDIATELY: *FEVER GREATER THAN 100.4 F (38 C) OR HIGHER *CHILLS OR SWEATING *NAUSEA AND VOMITING THAT IS NOT CONTROLLED WITH YOUR NAUSEA MEDICATION *UNUSUAL SHORTNESS OF BREATH *UNUSUAL BRUISING OR BLEEDING *URINARY PROBLEMS (pain or burning when urinating, or frequent urination) *BOWEL PROBLEMS (unusual diarrhea, constipation, pain near the anus) TENDERNESS IN MOUTH AND THROAT WITH OR WITHOUT PRESENCE OF ULCERS (sore throat, sores in mouth, or a toothache) UNUSUAL RASH, SWELLING OR PAIN  UNUSUAL VAGINAL DISCHARGE OR ITCHING   Items with * indicate a potential emergency and should be followed up as soon as possible or go to the Emergency Department if any problems should occur.  Please show the CHEMOTHERAPY ALERT CARD or IMMUNOTHERAPY ALERT CARD at check-in to the Emergency Department and triage nurse.  Should you have questions after your visit or need to cancel or reschedule your appointment, please contact [facility]  Dept: [phone]  and follow the prompts.  Office hours are 8:00 a.m. to 4:30 p.m. Monday - Friday. Please note that  voicemails left after 4:00 p.m. may not be returned until the following business day.  We are closed weekends and major holidays. You have access to a nurse at all times for urgent questions. Please call the main number to the clinic Dept: [phone] and follow the prompts.   For any non-urgent questions, you may also contact your provider using MyChart. We now offer e-Visits for anyone 62 and older to request care online for  non-urgent symptoms. For details visit [email or web address].   Also download the MyChart app! Go to the app store, search "MyChart", open the app, select [facility], and log in with your MyChart username and password.  Due to Covid, a mask is required upon entering the hospital/clinic. If you do not have a mask, one will be given to you upon arrival. For doctor visits, patients may have 1 support person aged 34 or older with them. For treatment visits, patients cannot have anyone with them due to current Covid guidelines and our immunocompromised population.   Oxaliplatin Injection What is this medication? OXALIPLATIN (ox AL i PLA tin) is a chemotherapy drug. It targets fast dividing cells, like cancer cells, and causes these cells to die. This medicine is used to treat cancers of the colon and rectum, and many other cancers. This medicine may be used for other purposes; ask your health care provider or pharmacist if you have questions. COMMON BRAND NAME(S): Eloxatin What should I tell my care team before I take this medication? They need to know if you have any of these conditions: heart disease history of irregular heartbeat liver disease low blood counts, like white cells, platelets, or red blood cells lung or breathing disease, like asthma take medicines that treat or prevent blood clots tingling of the fingers or toes, or other nerve disorder an unusual or allergic reaction to oxaliplatin, other chemotherapy, other medicines, foods, dyes, or preservatives pregnant or trying to get pregnant breast-feeding How should I use this medication? This drug is given as an infusion into a vein. It is administered in a hospital or clinic by a specially trained health care professional. Talk to your pediatrician regarding the use of this medicine in children. Special care may be needed. Overdosage: If you think you have taken too much of this medicine contact a poison control center or emergency  room at once. NOTE: This medicine is only for you. Do not share this medicine with others. What if I miss a dose? It is important not to miss a dose. Call your doctor or health care professional if you are unable to keep an appointment. What may interact with this medication? Do not take this medicine with any of the following medications: cisapride dronedarone pimozide thioridazine This medicine may also interact with the following medications: aspirin and aspirin-like medicines certain medicines that treat or prevent blood clots like warfarin, apixaban, dabigatran, and rivaroxaban cisplatin cyclosporine diuretics medicines for infection like acyclovir, adefovir, amphotericin B, bacitracin, cidofovir, foscarnet, ganciclovir, gentamicin, pentamidine, vancomycin NSAIDs, medicines for pain and inflammation, like ibuprofen or naproxen other medicines that prolong the QT interval (an abnormal heart rhythm) pamidronate zoledronic acid This list may not describe all possible interactions. Give your health care provider a list of all the medicines, herbs, non-prescription drugs, or dietary supplements you use. Also tell them if you smoke, drink alcohol, or use illegal drugs. Some items may interact with your medicine. What should I watch for while using this medication? Your condition will be monitored carefully while you are receiving  this medicine. You may need blood work done while you are taking this medicine. This medicine may make you feel generally unwell. This is not uncommon as chemotherapy can affect healthy cells as well as cancer cells. Report any side effects. Continue your course of treatment even though you feel ill unless your healthcare professional tells you to stop. This medicine can make you more sensitive to cold. Do not drink cold drinks or use ice. Cover exposed skin before coming in contact with cold temperatures or cold objects. When out in cold weather wear warm clothing  and cover your mouth and nose to warm the air that goes into your lungs. Tell your doctor if you get sensitive to the cold. Do not become pregnant while taking this medicine or for 9 months after stopping it. Women should inform their health care professional if they wish to become pregnant or think they might be pregnant. Men should not father a child while taking this medicine and for 6 months after stopping it. There is potential for serious side effects to an unborn child. Talk to your health care professional for more information. Do not breast-feed a child while taking this medicine or for 3 months after stopping it. This medicine has caused ovarian failure in some women. This medicine may make it more difficult to get pregnant. Talk to your health care professional if you are concerned about your fertility. This medicine has caused decreased sperm counts in some men. This may make it more difficult to father a child. Talk to your health care professional if you are concerned about your fertility. This medicine may increase your risk of getting an infection. Call your health care professional for advice if you get a fever, chills, or sore throat, or other symptoms of a cold or flu. Do not treat yourself. Try to avoid being around people who are sick. Avoid taking medicines that contain aspirin, acetaminophen, ibuprofen, naproxen, or ketoprofen unless instructed by your health care professional. These medicines may hide a fever. Be careful brushing or flossing your teeth or using a toothpick because you may get an infection or bleed more easily. If you have any dental work done, tell your dentist you are receiving this medicine. What side effects may I notice from receiving this medication? Side effects that you should report to your doctor or health care professional as soon as possible: allergic reactions like skin rash, itching or hives, swelling of the face, lips, or tongue breathing  problems cough low blood counts - this medicine may decrease the number of white blood cells, red blood cells, and platelets. You may be at increased risk for infections and bleeding nausea, vomiting pain, redness, or irritation at site where injected pain, tingling, numbness in the hands or feet signs and symptoms of bleeding such as bloody or black, tarry stools; red or dark brown urine; spitting up blood or brown material that looks like coffee grounds; red spots on the skin; unusual bruising or bleeding from the eyes, gums, or nose signs and symptoms of a dangerous change in heartbeat or heart rhythm like chest pain; dizziness; fast, irregular heartbeat; palpitations; feeling faint or lightheaded; falls signs and symptoms of infection like fever; chills; cough; sore throat; pain or trouble passing urine signs and symptoms of liver injury like dark yellow or brown urine; general ill feeling or flu-like symptoms; light-colored stools; loss of appetite; nausea; right upper belly pain; unusually weak or tired; yellowing of the eyes or skin signs and symptoms  of low red blood cells or anemia such as unusually weak or tired; feeling faint or lightheaded; falls signs and symptoms of muscle injury like dark urine; trouble passing urine or change in the amount of urine; unusually weak or tired; muscle pain; back pain Side effects that usually do not require medical attention (report to your doctor or health care professional if they continue or are bothersome): changes in taste diarrhea gas hair loss loss of appetite mouth sores This list may not describe all possible side effects. Call your doctor for medical advice about side effects. You may report side effects to FDA at 1-800-FDA-1088. Where should I keep my medication? This drug is given in a hospital or clinic and will not be stored at home. NOTE: This sheet is a summary. It may not cover all possible information. If you have questions about  this medicine, talk to your doctor, pharmacist, or health care provider.  2022 Elsevier/Gold Standard (2020-12-28 00:00:00)  Leucovorin injection What is this medication? LEUCOVORIN (loo koe VOR in) is used to prevent or treat the harmful effects of some medicines. This medicine is used to treat anemia caused by a low amount of folic acid in the body. It is also used with 5-fluorouracil (5-FU) to treat colon cancer. This medicine may be used for other purposes; ask your health care provider or pharmacist if you have questions. What should I tell my care team before I take this medication? They need to know if you have any of these conditions: anemia from low levels of vitamin B-12 in the blood an unusual or allergic reaction to leucovorin, folic acid, other medicines, foods, dyes, or preservatives pregnant or trying to get pregnant breast-feeding How should I use this medication? This medicine is for injection into a muscle or into a vein. It is given by a health care professional in a hospital or clinic setting. Talk to your pediatrician regarding the use of this medicine in children. Special care may be needed. Overdosage: If you think you have taken too much of this medicine contact a poison control center or emergency room at once. NOTE: This medicine is only for you. Do not share this medicine with others. What if I miss a dose? This does not apply. What may interact with this medication? capecitabine fluorouracil phenobarbital phenytoin primidone trimethoprim-sulfamethoxazole This list may not describe all possible interactions. Give your health care provider a list of all the medicines, herbs, non-prescription drugs, or dietary supplements you use. Also tell them if you smoke, drink alcohol, or use illegal drugs. Some items may interact with your medicine. What should I watch for while using this medication? Your condition will be monitored carefully while you are receiving this  medicine. This medicine may increase the side effects of 5-fluorouracil, 5-FU. Tell your doctor or health care professional if you have diarrhea or mouth sores that do not get better or that get worse. What side effects may I notice from receiving this medication? Side effects that you should report to your doctor or health care professional as soon as possible: allergic reactions like skin rash, itching or hives, swelling of the face, lips, or tongue breathing problems fever, infection mouth sores unusual bleeding or bruising unusually weak or tired Side effects that usually do not require medical attention (report to your doctor or health care professional if they continue or are bothersome): constipation or diarrhea loss of appetite nausea, vomiting This list may not describe all possible side effects. Call your doctor for  medical advice about side effects. You may report side effects to FDA at 1-800-FDA-1088. Where should I keep my medication? This drug is given in a hospital or clinic and will not be stored at home. NOTE: This sheet is a summary. It may not cover all possible information. If you have questions about this medicine, talk to your doctor, pharmacist, or health care provider.  2022 Elsevier/Gold Standard (2007-10-17 00:00:00)  Fluorouracil, 5-FU injection What is this medication? FLUOROURACIL, 5-FU (flure oh YOOR a sil) is a chemotherapy drug. It slows the growth of cancer cells. This medicine is used to treat many types of cancer like breast cancer, colon or rectal cancer, pancreatic cancer, and stomach cancer. This medicine may be used for other purposes; ask your health care provider or pharmacist if you have questions. COMMON BRAND NAME(S): Adrucil What should I tell my care team before I take this medication? They need to know if you have any of these conditions: blood disorders dihydropyrimidine dehydrogenase (DPD) deficiency infection (especially a virus  infection such as chickenpox, cold sores, or herpes) kidney disease liver disease malnourished, poor nutrition recent or ongoing radiation therapy an unusual or allergic reaction to fluorouracil, other chemotherapy, other medicines, foods, dyes, or preservatives pregnant or trying to get pregnant breast-feeding How should I use this medication? This drug is given as an infusion or injection into a vein. It is administered in a hospital or clinic by a specially trained health care professional. Talk to your pediatrician regarding the use of this medicine in children. Special care may be needed. Overdosage: If you think you have taken too much of this medicine contact a poison control center or emergency room at once. NOTE: This medicine is only for you. Do not share this medicine with others. What if I miss a dose? It is important not to miss your dose. Call your doctor or health care professional if you are unable to keep an appointment. What may interact with this medication? Do not take this medicine with any of the following medications: live virus vaccines This medicine may also interact with the following medications: medicines that treat or prevent blood clots like warfarin, enoxaparin, and dalteparin This list may not describe all possible interactions. Give your health care provider a list of all the medicines, herbs, non-prescription drugs, or dietary supplements you use. Also tell them if you smoke, drink alcohol, or use illegal drugs. Some items may interact with your medicine. What should I watch for while using this medication? Visit your doctor for checks on your progress. This drug may make you feel generally unwell. This is not uncommon, as chemotherapy can affect healthy cells as well as cancer cells. Report any side effects. Continue your course of treatment even though you feel ill unless your doctor tells you to stop. In some cases, you may be given additional medicines to  help with side effects. Follow all directions for their use. Call your doctor or health care professional for advice if you get a fever, chills or sore throat, or other symptoms of a cold or flu. Do not treat yourself. This drug decreases your body's ability to fight infections. Try to avoid being around people who are sick. This medicine may increase your risk to bruise or bleed. Call your doctor or health care professional if you notice any unusual bleeding. Be careful brushing and flossing your teeth or using a toothpick because you may get an infection or bleed more easily. If you have any dental work  done, tell your dentist you are receiving this medicine. Avoid taking products that contain aspirin, acetaminophen, ibuprofen, naproxen, or ketoprofen unless instructed by your doctor. These medicines may hide a fever. Do not become pregnant while taking this medicine. Women should inform their doctor if they wish to become pregnant or think they might be pregnant. There is a potential for serious side effects to an unborn child. Talk to your health care professional or pharmacist for more information. Do not breast-feed an infant while taking this medicine. Men should inform their doctor if they wish to father a child. This medicine may lower sperm counts. Do not treat diarrhea with over the counter products. Contact your doctor if you have diarrhea that lasts more than 2 days or if it is severe and watery. This medicine can make you more sensitive to the sun. Keep out of the sun. If you cannot avoid being in the sun, wear protective clothing and use sunscreen. Do not use sun lamps or tanning beds/booths. What side effects may I notice from receiving this medication? Side effects that you should report to your doctor or health care professional as soon as possible: allergic reactions like skin rash, itching or hives, swelling of the face, lips, or tongue low blood counts - this medicine may decrease  the number of white blood cells, red blood cells and platelets. You may be at increased risk for infections and bleeding. signs of infection - fever or chills, cough, sore throat, pain or difficulty passing urine signs of decreased platelets or bleeding - bruising, pinpoint red spots on the skin, black, tarry stools, blood in the urine signs of decreased red blood cells - unusually weak or tired, fainting spells, lightheadedness breathing problems changes in vision chest pain mouth sores nausea and vomiting pain, swelling, redness at site where injected pain, tingling, numbness in the hands or feet redness, swelling, or sores on hands or feet stomach pain unusual bleeding Side effects that usually do not require medical attention (report to your doctor or health care professional if they continue or are bothersome): changes in finger or toe nails diarrhea dry or itchy skin hair loss headache loss of appetite sensitivity of eyes to the light stomach upset unusually teary eyes This list may not describe all possible side effects. Call your doctor for medical advice about side effects. You may report side effects to FDA at 1-800-FDA-1088. Where should I keep my medication? This drug is given in a hospital or clinic and will not be stored at home. NOTE: This sheet is a summary. It may not cover all possible information. If you have questions about this medicine, talk to your doctor, pharmacist, or health care provider.  2022 Elsevier/Gold Standard (2020-12-28 00:00:00)

## 2021-04-04 NOTE — Progress Notes (Signed)
Patient presents for treatment. RN assessment completed along with the following:  Labs/vitals reviewed - Yes, and within treatment parameters.   Weight within 10% of previous measurement - Yes Oncology Treatment Attestation completed for current therapy- Yes, on date 02/09/21 Informed consent completed and reflects current therapy/intent - Yes, on date 02/21/21             Provider progress note reviewed - Yes, today's provider note was reviewed. Treatment/Antibody/Supportive plan reviewed - Yes, and there are no adjustments needed for today's treatment. S&H and other orders reviewed - Yes, and there are no additional orders identified. Previous treatment date reviewed - Yes, and the appropriate amount of time has elapsed between treatments. Clinic Hand Off Received from - Leander Rams, NP   Patient to proceed with treatment.

## 2021-04-06 ENCOUNTER — Other Ambulatory Visit: Payer: Self-pay

## 2021-04-06 ENCOUNTER — Inpatient Hospital Stay: Payer: Self-pay

## 2021-04-06 VITALS — BP 165/60 | HR 64 | Temp 97.8°F | Resp 19

## 2021-04-06 DIAGNOSIS — C189 Malignant neoplasm of colon, unspecified: Secondary | ICD-10-CM

## 2021-04-06 MED ORDER — HEPARIN SOD (PORK) LOCK FLUSH 100 UNIT/ML IV SOLN
500.0000 [IU] | Freq: Once | INTRAVENOUS | Status: AC | PRN
  Administered 2021-04-06: 13:00:00 500 [IU]

## 2021-04-06 MED ORDER — SODIUM CHLORIDE 0.9% FLUSH
10.0000 mL | INTRAVENOUS | Status: DC | PRN
  Administered 2021-04-06: 13:00:00 10 mL

## 2021-04-06 NOTE — Patient Instructions (Signed)

## 2021-04-18 ENCOUNTER — Other Ambulatory Visit: Payer: Self-pay | Admitting: Oncology

## 2021-04-19 ENCOUNTER — Other Ambulatory Visit: Payer: Self-pay

## 2021-04-19 ENCOUNTER — Inpatient Hospital Stay (HOSPITAL_BASED_OUTPATIENT_CLINIC_OR_DEPARTMENT_OTHER): Payer: Self-pay | Admitting: Oncology

## 2021-04-19 ENCOUNTER — Inpatient Hospital Stay: Payer: Self-pay

## 2021-04-19 VITALS — BP 159/78 | HR 73 | Temp 97.8°F | Resp 18 | Ht 68.0 in | Wt 176.0 lb

## 2021-04-19 VITALS — BP 169/71 | HR 65

## 2021-04-19 DIAGNOSIS — C189 Malignant neoplasm of colon, unspecified: Secondary | ICD-10-CM

## 2021-04-19 LAB — CBC WITH DIFFERENTIAL (CANCER CENTER ONLY)
Abs Immature Granulocytes: 0.01 10*3/uL (ref 0.00–0.07)
Basophils Absolute: 0 10*3/uL (ref 0.0–0.1)
Basophils Relative: 1 %
Eosinophils Absolute: 0.1 10*3/uL (ref 0.0–0.5)
Eosinophils Relative: 2 %
HCT: 38 % — ABNORMAL LOW (ref 39.0–52.0)
Hemoglobin: 12 g/dL — ABNORMAL LOW (ref 13.0–17.0)
Immature Granulocytes: 0 %
Lymphocytes Relative: 34 %
Lymphs Abs: 1.4 10*3/uL (ref 0.7–4.0)
MCH: 26.7 pg (ref 26.0–34.0)
MCHC: 31.6 g/dL (ref 30.0–36.0)
MCV: 84.4 fL (ref 80.0–100.0)
Monocytes Absolute: 0.8 10*3/uL (ref 0.1–1.0)
Monocytes Relative: 19 %
Neutro Abs: 1.7 10*3/uL (ref 1.7–7.7)
Neutrophils Relative %: 44 %
Platelet Count: 111 10*3/uL — ABNORMAL LOW (ref 150–400)
RBC: 4.5 MIL/uL (ref 4.22–5.81)
RDW: 17.3 % — ABNORMAL HIGH (ref 11.5–15.5)
WBC Count: 4 10*3/uL (ref 4.0–10.5)
nRBC: 0 % (ref 0.0–0.2)

## 2021-04-19 LAB — CMP (CANCER CENTER ONLY)
ALT: 14 U/L (ref 0–44)
AST: 22 U/L (ref 15–41)
Albumin: 3.6 g/dL (ref 3.5–5.0)
Alkaline Phosphatase: 77 U/L (ref 38–126)
Anion gap: 8 (ref 5–15)
BUN: 23 mg/dL (ref 8–23)
CO2: 25 mmol/L (ref 22–32)
Calcium: 8.8 mg/dL — ABNORMAL LOW (ref 8.9–10.3)
Chloride: 105 mmol/L (ref 98–111)
Creatinine: 0.88 mg/dL (ref 0.61–1.24)
GFR, Estimated: >60 mL/min (ref >60)
Glucose, Bld: 126 mg/dL — ABNORMAL HIGH (ref 70–99)
Potassium: 3.8 mmol/L (ref 3.5–5.1)
Sodium: 138 mmol/L (ref 135–145)
Total Bilirubin: 0.5 mg/dL (ref 0.3–1.2)
Total Protein: 6.3 g/dL — ABNORMAL LOW (ref 6.5–8.1)

## 2021-04-19 LAB — CEA (ACCESS): CEA (CHCC): 3.04 ng/mL (ref 0.00–5.00)

## 2021-04-19 MED ORDER — SODIUM CHLORIDE 0.9 % IV SOLN
10.0000 mg | Freq: Once | INTRAVENOUS | Status: AC
  Administered 2021-04-19: 12:00:00 10 mg via INTRAVENOUS
  Filled 2021-04-19: qty 1

## 2021-04-19 MED ORDER — OXALIPLATIN CHEMO INJECTION 100 MG/20ML
65.0000 mg/m2 | Freq: Once | INTRAVENOUS | Status: AC
  Administered 2021-04-19: 13:00:00 125 mg via INTRAVENOUS
  Filled 2021-04-19: qty 20

## 2021-04-19 MED ORDER — SODIUM CHLORIDE 0.9 % IV SOLN
2000.0000 mg/m2 | INTRAVENOUS | Status: DC
  Administered 2021-04-19: 16:00:00 3850 mg via INTRAVENOUS
  Filled 2021-04-19: qty 77

## 2021-04-19 MED ORDER — DEXTROSE 5 % IV SOLN: Freq: Once | INTRAVENOUS | Status: AC

## 2021-04-19 MED ORDER — FLUOROURACIL CHEMO INJECTION 2.5 GM/50ML
300.0000 mg/m2 | Freq: Once | INTRAVENOUS | Status: AC
  Administered 2021-04-19: 16:00:00 600 mg via INTRAVENOUS
  Filled 2021-04-19: qty 12

## 2021-04-19 MED ORDER — PALONOSETRON HCL INJECTION 0.25 MG/5ML
0.2500 mg | Freq: Once | INTRAVENOUS | Status: AC
  Administered 2021-04-19: 12:00:00 0.25 mg via INTRAVENOUS
  Filled 2021-04-19: qty 5

## 2021-04-19 MED ORDER — LEUCOVORIN CALCIUM INJECTION 350 MG
300.0000 mg/m2 | Freq: Once | INTRAVENOUS | Status: AC
  Administered 2021-04-19: 13:00:00 580 mg via INTRAVENOUS
  Filled 2021-04-19: qty 25

## 2021-04-19 NOTE — Progress Notes (Signed)
[place] OFFICE PROGRESS NOTE   Diagnosis: Colon cancer  INTERVAL HISTORY:   [name] completed another cycle of FOLFOX on 04/04/2021.  He is here today with his son and a Spanish interpreter.  He feels well.  No nausea or diarrhea.  He had mild cold sensitivity following chemotherapy.  No neuropathy symptoms at present.  He is noted thickening of the skin over the fingers.  Objective:  Vital signs in last 24 hours:  Blood pressure (!) 159/78, pulse 73, temperature 97.8 F (36.6 C), temperature source Oral, resp. rate 18, height 5' 8"  (1.727 m), weight 176 lb (79.8 kg), SpO2 100 %.    HEENT: No thrush or ulcers Resp: Lungs clear bilaterally Cardio: Regular rate and rhythm GI: No hepatomegaly Vascular: No leg edema  Skin: Palms without erythema, mild skin thickening at the distal fingers  Portacath/PICC-without erythema  Lab Results:  Lab Results  Component Value Date   WBC 4.0 04/19/2021   HGB 12.0 (L) 04/19/2021   HCT 38.0 (L) 04/19/2021   MCV 84.4 04/19/2021   PLT 111 (L) 04/19/2021   NEUTROABS 1.7 04/19/2021    CMP  Lab Results  Component Value Date   NA 138 04/19/2021   K 3.8 04/19/2021   CL 105 04/19/2021   CO2 25 04/19/2021   GLUCOSE 126 (H) 04/19/2021   BUN 23 04/19/2021   CREATININE 0.88 04/19/2021   CALCIUM 8.8 (L) 04/19/2021   PROT 6.3 (L) 04/19/2021   ALBUMIN 3.6 04/19/2021   AST 22 04/19/2021   ALT 14 04/19/2021   ALKPHOS 77 04/19/2021   BILITOT 0.5 04/19/2021   GFRNONAA >60 04/19/2021   GFRAA >60 12/01/2019    Lab Results  Component Value Date   CEA1 12.10 (H) 01/03/2021   CEA 3.04 04/19/2021      Medications: I have reviewed the patient's current medications.   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), transverse colon cancer stage IIB (T4aN0) status post extended right hemicolectomy 05/07/2019,  MSS Tumor invasive superficial portion of the muscle ("internal third "), no tumor perforation, no vascular invasion or perineural invasion, negative resection margins, 0/4 lymph nodes Elevated CEA 02/10/2019 CTs 03/05/2019, compared to outside CTs from 11/22/2018-worsened wall thickening in the cecum stable apple core lesion in the mid transverse colon, no evidence of recurrent tumor at the distal colonic anastomosis, no evidence of metastatic disease Colonoscopy 03/24/2019 20-2 malignant appearing masses noted in the colon, 1 filled the cecum measuring approximately 4 cm across and friable.  The other malignant appearing mass was circumferential creating a stricture with a 1.2 cm lumen located in the transverse segment approximately 4 to 5 cm in length.  There were 12-18 neoplastic appearing polyps in between the 2 obvious malignant masses ranging in size from 3 mm to 2 cm.  5 polyps distal to the transverse colon mass.  2 sigmoid colon polyps.  Cecum mass positive for adenocarcinoma.  Transverse colon mass positive for adenocarcinoma. 05/07/2019 laparoscopic extended right hemicolectomy, lysis of adhesions-5 cm invasive moderately differentiated adenocarcinoma involving cecum and proximal ascending colon, carcinoma invades into the pericolonic soft tissue; 7 cm invasive moderately differentiated carcinoma involving the transverse colon, carcinoma invades into the serosal surface (visceral peritoneum); all resection margins negative for carcinoma.  Lymphovascular invasion present.  In the proximal colon metastatic carcinoma involves 3 of 17 lymph nodes with 1 tumor deposit.  In the transverse colon 12 lymph  nodes negative for metastatic carcinoma.  3 separate polyps: Tubular adenoma, inflammatory polyp and hyperplastic polyp Cycle 1 capecitabine 06/02/2019 Cycle 2 capecitabine 06/23/2019 Cycle 3 capecitabine 07/14/2019 Cycle 4 capecitabine 08/04/2019 Cycle 5 capecitabine 08/25/2019 Cycle 6 capecitabine  09/15/2019 Cycle 7 capecitabine 10/06/2019 Cycle 8 capecitabine 10/27/2019 Upper endoscopy 07/23/2020-negative Colonoscopy 07/23/2020-normal-appearing anastomoses, polyp removed from the descending colon-tubular adenoma CTs 01/18/2021-right abdominal mesenteric mass, mass in the low anatomic pelvis consistent with metastases Cycle 1 FOLFOX 02/21/2021 Cycle 2 FOLFOX 03/07/2021, dose reductions made due to mild neutropenia Cycle 3 FOLFOX 03/21/2021 Cycle 4 FOLFOX 04/04/2021 Cycle 5 FOLFOX 04/19/2021 2.   Microcytic anemia secondary to #1.    Resolved 3.   Family history of pancreas and colon cancer, negative genetic testing per the [facility] panel, ATM variant of unknown significance       Disposition: [name] has completed 4 cycles of FOLFOX.  He has tolerated the chemotherapy well.  The CEA has normalized.  He does not have significant neuropathy symptoms.  He will complete cycle 5 FOLFOX today.  [name].  The neutrophil count is borderline low today.  He will call for a fever or symptoms of infection.  We will add G-CSF as needed when he is here in 2 weeks.  The plan is to refer him for restaging CTs after cycle 6.  Betsy Coder, MD  04/19/2021  11:25 AM

## 2021-04-19 NOTE — Progress Notes (Signed)
Patient presents for treatment. RN assessment completed along with the following:  Labs/vitals reviewed - Yes, and within treatment parameters.   Weight within 10% of previous measurement - Yes Oncology Treatment Attestation completed for current therapy- Yes, on date 02/09/21 Informed consent completed and reflects current therapy/intent - Yes, on date 02/21/21             Provider progress note reviewed - Yes, today's provider note was reviewed. Treatment/Antibody/Supportive plan reviewed - Yes, and there are no adjustments needed for today's treatment. S&H and other orders reviewed - Yes, and there are no additional orders identified. Previous treatment date reviewed - Yes, and the appropriate amount of time has elapsed between treatments. Clinic Hand Off Received from - none   Patient to proceed with treatment.

## 2021-04-19 NOTE — Patient Instructions (Addendum)
[place]   The chemotherapy medication bag should finish at 46 hours, 96 hours, or 7 days. For example, if your pump is scheduled for 46 hours and it was put on at 4:00 p.m., it should finish at 2:00 p.m. the day it is scheduled to come off regardless of your appointment time.     Estimated time to finish at  1:45 Thursday, April 21, 2021.   If the display on your pump reads "Low Volume" and it is beeping, take the batteries out of the pump and come to the cancer center for it to be taken off.   If the pump alarms go off prior to the pump reading "Low Volume" then call [phone] and someone can assist you.  If the plunger comes out and the chemotherapy medication is leaking out, please use your home chemo spill kit to clean up the spill. Do NOT use paper towels or other household products.  If you have problems or questions regarding your pump, please call either [phone] (24 hours a day) or the cancer center Monday-Friday 8:00 a.m.- 4:30 p.m. at the clinic number and we will assist you. If you are unable to get assistance, then go to the nearest Emergency Department and ask the staff to contact the IV team for assistance.  Discharge Instructions: Thank you for choosing [facility] to provide your oncology and hematology care.   If you have a lab appointment with the [facility], please go directly to the [facility] and check in at the registration area.   Wear comfortable clothing and clothing appropriate for easy access to any Portacath or PICC line.   We strive to give you quality time with your provider. You may need to reschedule your appointment if you arrive late (15 or more minutes).  Arriving late affects you and other patients whose appointments are after yours.  Also, if you miss three or more appointments without notifying the office, you may be dismissed from the clinic at the providers discretion.      For  prescription refill requests, have your pharmacy contact our office and allow 72 hours for refills to be completed.    Today you received the following chemotherapy and/or immunotherapy agents oxaliplatin, leucovorin, fluorouracil      To help prevent nausea and vomiting after your treatment, we encourage you to take your nausea medication as directed.  BELOW ARE SYMPTOMS THAT SHOULD BE REPORTED IMMEDIATELY: *FEVER GREATER THAN 100.4 F (38 C) OR HIGHER *CHILLS OR SWEATING *NAUSEA AND VOMITING THAT IS NOT CONTROLLED WITH YOUR NAUSEA MEDICATION *UNUSUAL SHORTNESS OF BREATH *UNUSUAL BRUISING OR BLEEDING *URINARY PROBLEMS (pain or burning when urinating, or frequent urination) *BOWEL PROBLEMS (unusual diarrhea, constipation, pain near the anus) TENDERNESS IN MOUTH AND THROAT WITH OR WITHOUT PRESENCE OF ULCERS (sore throat, sores in mouth, or a toothache) UNUSUAL RASH, SWELLING OR PAIN  UNUSUAL VAGINAL DISCHARGE OR ITCHING   Items with * indicate a potential emergency and should be followed up as soon as possible or go to the Emergency Department if any problems should occur.  Please show the CHEMOTHERAPY ALERT CARD or IMMUNOTHERAPY ALERT CARD at check-in to the Emergency Department and triage nurse.  Should you have questions after your visit or need to cancel or reschedule your appointment, please contact [facility]  Dept: [phone]  and follow the prompts.  Office hours are 8:00 a.m. to 4:30 p.m. Monday - Friday. Please note that  voicemails left after 4:00 p.m. may not be returned until the following business day.  We are closed weekends and major holidays. You have access to a nurse at all times for urgent questions. Please call the main number to the clinic Dept: [phone] and follow the prompts.   For any non-urgent questions, you may also contact your provider using MyChart. We now offer e-Visits for anyone 50 and older to request care online for  non-urgent symptoms. For details visit [email or web address].   Also download the MyChart app! Go to the app store, search "MyChart", open the app, select [facility], and log in with your MyChart username and password.  Due to Covid, a mask is required upon entering the hospital/clinic. If you do not have a mask, one will be given to you upon arrival. For doctor visits, patients may have 1 support person aged 58 or older with them. For treatment visits, patients cannot have anyone with them due to current Covid guidelines and our immunocompromised population.   Oxaliplatin Injection What is this medication? OXALIPLATIN (ox AL i PLA tin) is a chemotherapy drug. It targets fast dividing cells, like cancer cells, and causes these cells to die. This medicine is used to treat cancers of the colon and rectum, and many other cancers. This medicine may be used for other purposes; ask your health care provider or pharmacist if you have questions. COMMON BRAND NAME(S): Eloxatin What should I tell my care team before I take this medication? They need to know if you have any of these conditions: heart disease history of irregular heartbeat liver disease low blood counts, like white cells, platelets, or red blood cells lung or breathing disease, like asthma take medicines that treat or prevent blood clots tingling of the fingers or toes, or other nerve disorder an unusual or allergic reaction to oxaliplatin, other chemotherapy, other medicines, foods, dyes, or preservatives pregnant or trying to get pregnant breast-feeding How should I use this medication? This drug is given as an infusion into a vein. It is administered in a hospital or clinic by a specially trained health care professional. Talk to your pediatrician regarding the use of this medicine in children. Special care may be needed. Overdosage: If you think you have taken too much of this medicine contact a poison control center or emergency  room at once. NOTE: This medicine is only for you. Do not share this medicine with others. What if I miss a dose? It is important not to miss a dose. Call your doctor or health care professional if you are unable to keep an appointment. What may interact with this medication? Do not take this medicine with any of the following medications: cisapride dronedarone pimozide thioridazine This medicine may also interact with the following medications: aspirin and aspirin-like medicines certain medicines that treat or prevent blood clots like warfarin, apixaban, dabigatran, and rivaroxaban cisplatin cyclosporine diuretics medicines for infection like acyclovir, adefovir, amphotericin B, bacitracin, cidofovir, foscarnet, ganciclovir, gentamicin, pentamidine, vancomycin NSAIDs, medicines for pain and inflammation, like ibuprofen or naproxen other medicines that prolong the QT interval (an abnormal heart rhythm) pamidronate zoledronic acid This list may not describe all possible interactions. Give your health care provider a list of all the medicines, herbs, non-prescription drugs, or dietary supplements you use. Also tell them if you smoke, drink alcohol, or use illegal drugs. Some items may interact with your medicine. What should I watch for while using this medication? Your condition will be monitored carefully while you are receiving  this medicine. You may need blood work done while you are taking this medicine. This medicine may make you feel generally unwell. This is not uncommon as chemotherapy can affect healthy cells as well as cancer cells. Report any side effects. Continue your course of treatment even though you feel ill unless your healthcare professional tells you to stop. This medicine can make you more sensitive to cold. Do not drink cold drinks or use ice. Cover exposed skin before coming in contact with cold temperatures or cold objects. When out in cold weather wear warm clothing  and cover your mouth and nose to warm the air that goes into your lungs. Tell your doctor if you get sensitive to the cold. Do not become pregnant while taking this medicine or for 9 months after stopping it. Women should inform their health care professional if they wish to become pregnant or think they might be pregnant. Men should not father a child while taking this medicine and for 6 months after stopping it. There is potential for serious side effects to an unborn child. Talk to your health care professional for more information. Do not breast-feed a child while taking this medicine or for 3 months after stopping it. This medicine has caused ovarian failure in some women. This medicine may make it more difficult to get pregnant. Talk to your health care professional if you are concerned about your fertility. This medicine has caused decreased sperm counts in some men. This may make it more difficult to father a child. Talk to your health care professional if you are concerned about your fertility. This medicine may increase your risk of getting an infection. Call your health care professional for advice if you get a fever, chills, or sore throat, or other symptoms of a cold or flu. Do not treat yourself. Try to avoid being around people who are sick. Avoid taking medicines that contain aspirin, acetaminophen, ibuprofen, naproxen, or ketoprofen unless instructed by your health care professional. These medicines may hide a fever. Be careful brushing or flossing your teeth or using a toothpick because you may get an infection or bleed more easily. If you have any dental work done, tell your dentist you are receiving this medicine. What side effects may I notice from receiving this medication? Side effects that you should report to your doctor or health care professional as soon as possible: allergic reactions like skin rash, itching or hives, swelling of the face, lips, or tongue breathing  problems cough low blood counts - this medicine may decrease the number of white blood cells, red blood cells, and platelets. You may be at increased risk for infections and bleeding nausea, vomiting pain, redness, or irritation at site where injected pain, tingling, numbness in the hands or feet signs and symptoms of bleeding such as bloody or black, tarry stools; red or dark brown urine; spitting up blood or brown material that looks like coffee grounds; red spots on the skin; unusual bruising or bleeding from the eyes, gums, or nose signs and symptoms of a dangerous change in heartbeat or heart rhythm like chest pain; dizziness; fast, irregular heartbeat; palpitations; feeling faint or lightheaded; falls signs and symptoms of infection like fever; chills; cough; sore throat; pain or trouble passing urine signs and symptoms of liver injury like dark yellow or brown urine; general ill feeling or flu-like symptoms; light-colored stools; loss of appetite; nausea; right upper belly pain; unusually weak or tired; yellowing of the eyes or skin signs and symptoms  of low red blood cells or anemia such as unusually weak or tired; feeling faint or lightheaded; falls signs and symptoms of muscle injury like dark urine; trouble passing urine or change in the amount of urine; unusually weak or tired; muscle pain; back pain Side effects that usually do not require medical attention (report to your doctor or health care professional if they continue or are bothersome): changes in taste diarrhea gas hair loss loss of appetite mouth sores This list may not describe all possible side effects. Call your doctor for medical advice about side effects. You may report side effects to FDA at 1-800-FDA-1088. Where should I keep my medication? This drug is given in a hospital or clinic and will not be stored at home. NOTE: This sheet is a summary. It may not cover all possible information. If you have questions about  this medicine, talk to your doctor, pharmacist, or health care provider.  2022 [employer]vier/Gold Standard (2020-12-28 00:00:00)  Leucovorin injection What is this medication? LEUCOVORIN (loo koe VOR in) is used to prevent or treat the harmful effects of some medicines. This medicine is used to treat anemia caused by a low amount of folic acid in the body. It is also used with 5-fluorouracil (5-FU) to treat colon cancer. This medicine may be used for other purposes; ask your health care provider or pharmacist if you have questions. What should I tell my care team before I take this medication? They need to know if you have any of these conditions: anemia from low levels of vitamin B-12 in the blood an unusual or allergic reaction to leucovorin, folic acid, other medicines, foods, dyes, or preservatives pregnant or trying to get pregnant breast-feeding How should I use this medication? This medicine is for injection into a muscle or into a vein. It is given by a health care professional in a hospital or clinic setting. Talk to your pediatrician regarding the use of this medicine in children. Special care may be needed. Overdosage: If you think you have taken too much of this medicine contact a poison control center or emergency room at once. NOTE: This medicine is only for you. Do not share this medicine with others. What if I miss a dose? This does not apply. What may interact with this medication? capecitabine fluorouracil phenobarbital phenytoin primidone trimethoprim-sulfamethoxazole This list may not describe all possible interactions. Give your health care provider a list of all the medicines, herbs, non-prescription drugs, or dietary supplements you use. Also tell them if you smoke, drink alcohol, or use illegal drugs. Some items may interact with your medicine. What should I watch for while using this medication? Your condition will be monitored carefully while you are receiving this  medicine. This medicine may increase the side effects of 5-fluorouracil, 5-FU. Tell your doctor or health care professional if you have diarrhea or mouth sores that do not get better or that get worse. What side effects may I notice from receiving this medication? Side effects that you should report to your doctor or health care professional as soon as possible: allergic reactions like skin rash, itching or hives, swelling of the face, lips, or tongue breathing problems fever, infection mouth sores unusual bleeding or bruising unusually weak or tired Side effects that usually do not require medical attention (report to your doctor or health care professional if they continue or are bothersome): constipation or diarrhea loss of appetite nausea, vomiting This list may not describe all possible side effects. Call your doctor for  medical advice about side effects. You may report side effects to FDA at 1-800-FDA-1088. Where should I keep my medication? This drug is given in a hospital or clinic and will not be stored at home. NOTE: This sheet is a summary. It may not cover all possible information. If you have questions about this medicine, talk to your doctor, pharmacist, or health care provider.  2022 Elsevier/Gold Standard (2007-10-17 00:00:00)  Fluorouracil, 5-FU injection What is this medication? FLUOROURACIL, 5-FU (flure oh YOOR a sil) is a chemotherapy drug. It slows the growth of cancer cells. This medicine is used to treat many types of cancer like breast cancer, colon or rectal cancer, pancreatic cancer, and stomach cancer. This medicine may be used for other purposes; ask your health care provider or pharmacist if you have questions. COMMON BRAND NAME(S): Adrucil What should I tell my care team before I take this medication? They need to know if you have any of these conditions: blood disorders dihydropyrimidine dehydrogenase (DPD) deficiency infection (especially a virus  infection such as chickenpox, cold sores, or herpes) kidney disease liver disease malnourished, poor nutrition recent or ongoing radiation therapy an unusual or allergic reaction to fluorouracil, other chemotherapy, other medicines, foods, dyes, or preservatives pregnant or trying to get pregnant breast-feeding How should I use this medication? This drug is given as an infusion or injection into a vein. It is administered in a hospital or clinic by a specially trained health care professional. Talk to your pediatrician regarding the use of this medicine in children. Special care may be needed. Overdosage: If you think you have taken too much of this medicine contact a poison control center or emergency room at once. NOTE: This medicine is only for you. Do not share this medicine with others. What if I miss a dose? It is important not to miss your dose. Call your doctor or health care professional if you are unable to keep an appointment. What may interact with this medication? Do not take this medicine with any of the following medications: live virus vaccines This medicine may also interact with the following medications: medicines that treat or prevent blood clots like warfarin, enoxaparin, and dalteparin This list may not describe all possible interactions. Give your health care provider a list of all the medicines, herbs, non-prescription drugs, or dietary supplements you use. Also tell them if you smoke, drink alcohol, or use illegal drugs. Some items may interact with your medicine. What should I watch for while using this medication? Visit your doctor for checks on your progress. This drug may make you feel generally unwell. This is not uncommon, as chemotherapy can affect healthy cells as well as cancer cells. Report any side effects. Continue your course of treatment even though you feel ill unless your doctor tells you to stop. In some cases, you may be given additional medicines to  help with side effects. Follow all directions for their use. Call your doctor or health care professional for advice if you get a fever, chills or sore throat, or other symptoms of a cold or flu. Do not treat yourself. This drug decreases your body's ability to fight infections. Try to avoid being around people who are sick. This medicine may increase your risk to bruise or bleed. Call your doctor or health care professional if you notice any unusual bleeding. Be careful brushing and flossing your teeth or using a toothpick because you may get an infection or bleed more easily. If you have any dental work  done, tell your dentist you are receiving this medicine. Avoid taking products that contain aspirin, acetaminophen, ibuprofen, naproxen, or ketoprofen unless instructed by your doctor. These medicines may hide a fever. Do not become pregnant while taking this medicine. Women should inform their doctor if they wish to become pregnant or think they might be pregnant. There is a potential for serious side effects to an unborn child. Talk to your health care professional or pharmacist for more information. Do not breast-feed an infant while taking this medicine. Men should inform their doctor if they wish to father a child. This medicine may lower sperm counts. Do not treat diarrhea with over the counter products. Contact your doctor if you have diarrhea that lasts more than 2 days or if it is severe and watery. This medicine can make you more sensitive to the sun. Keep out of the sun. If you cannot avoid being in the sun, wear protective clothing and use sunscreen. Do not use sun lamps or tanning beds/booths. What side effects may I notice from receiving this medication? Side effects that you should report to your doctor or health care professional as soon as possible: allergic reactions like skin rash, itching or hives, swelling of the face, lips, or tongue low blood counts - this medicine may decrease  the number of white blood cells, red blood cells and platelets. You may be at increased risk for infections and bleeding. signs of infection - fever or chills, cough, sore throat, pain or difficulty passing urine signs of decreased platelets or bleeding - bruising, pinpoint red spots on the skin, black, tarry stools, blood in the urine signs of decreased red blood cells - unusually weak or tired, fainting spells, lightheadedness breathing problems changes in vision chest pain mouth sores nausea and vomiting pain, swelling, redness at site where injected pain, tingling, numbness in the hands or feet redness, swelling, or sores on hands or feet stomach pain unusual bleeding Side effects that usually do not require medical attention (report to your doctor or health care professional if they continue or are bothersome): changes in finger or toe nails diarrhea dry or itchy skin hair loss headache loss of appetite sensitivity of eyes to the light stomach upset unusually teary eyes This list may not describe all possible side effects. Call your doctor for medical advice about side effects. You may report side effects to FDA at 1-800-FDA-1088. Where should I keep my medication? This drug is given in a hospital or clinic and will not be stored at home. NOTE: This sheet is a summary. It may not cover all possible information. If you have questions about this medicine, talk to your doctor, pharmacist, or health care provider.  2022 [employer]vier/Gold Standard (2020-12-28 00:00:00)

## 2021-04-21 ENCOUNTER — Inpatient Hospital Stay: Payer: Self-pay

## 2021-04-21 ENCOUNTER — Other Ambulatory Visit: Payer: Self-pay

## 2021-04-21 VITALS — BP 160/75 | HR 94 | Temp 98.2°F | Resp 18

## 2021-04-21 DIAGNOSIS — C189 Malignant neoplasm of colon, unspecified: Secondary | ICD-10-CM

## 2021-04-21 MED ORDER — SODIUM CHLORIDE 0.9% FLUSH
10.0000 mL | INTRAVENOUS | Status: DC | PRN
  Administered 2021-04-21: 14:00:00 10 mL

## 2021-04-21 MED ORDER — HEPARIN SOD (PORK) LOCK FLUSH 100 UNIT/ML IV SOLN
500.0000 [IU] | Freq: Once | INTRAVENOUS | Status: AC | PRN
  Administered 2021-04-21: 14:00:00 500 [IU]

## 2021-04-25 ENCOUNTER — Other Ambulatory Visit: Payer: Self-pay | Admitting: Oncology

## 2021-05-02 ENCOUNTER — Inpatient Hospital Stay: Payer: 59

## 2021-05-02 ENCOUNTER — Encounter: Payer: Self-pay | Admitting: Oncology

## 2021-05-02 ENCOUNTER — Encounter: Payer: Self-pay | Admitting: Nurse Practitioner

## 2021-05-02 ENCOUNTER — Inpatient Hospital Stay (HOSPITAL_BASED_OUTPATIENT_CLINIC_OR_DEPARTMENT_OTHER): Payer: 59 | Admitting: Nurse Practitioner

## 2021-05-02 ENCOUNTER — Other Ambulatory Visit: Payer: Self-pay

## 2021-05-02 ENCOUNTER — Inpatient Hospital Stay: Payer: 59 | Attending: Oncology

## 2021-05-02 VITALS — BP 134/60 | HR 78 | Temp 97.9°F | Resp 18 | Ht 68.0 in | Wt 174.8 lb

## 2021-05-02 VITALS — BP 159/62 | HR 64

## 2021-05-02 DIAGNOSIS — C189 Malignant neoplasm of colon, unspecified: Secondary | ICD-10-CM

## 2021-05-02 DIAGNOSIS — C182 Malignant neoplasm of ascending colon: Secondary | ICD-10-CM | POA: Diagnosis not present

## 2021-05-02 DIAGNOSIS — C184 Malignant neoplasm of transverse colon: Secondary | ICD-10-CM | POA: Insufficient documentation

## 2021-05-02 DIAGNOSIS — C187 Malignant neoplasm of sigmoid colon: Secondary | ICD-10-CM | POA: Insufficient documentation

## 2021-05-02 DIAGNOSIS — D509 Iron deficiency anemia, unspecified: Secondary | ICD-10-CM | POA: Diagnosis not present

## 2021-05-02 DIAGNOSIS — D696 Thrombocytopenia, unspecified: Secondary | ICD-10-CM | POA: Insufficient documentation

## 2021-05-02 DIAGNOSIS — C180 Malignant neoplasm of cecum: Secondary | ICD-10-CM | POA: Diagnosis not present

## 2021-05-02 DIAGNOSIS — C779 Secondary and unspecified malignant neoplasm of lymph node, unspecified: Secondary | ICD-10-CM | POA: Insufficient documentation

## 2021-05-02 DIAGNOSIS — Z5111 Encounter for antineoplastic chemotherapy: Secondary | ICD-10-CM | POA: Diagnosis present

## 2021-05-02 DIAGNOSIS — Z800 Family history of malignant neoplasm of digestive organs: Secondary | ICD-10-CM | POA: Insufficient documentation

## 2021-05-02 LAB — CMP (CANCER CENTER ONLY)
ALT: 14 U/L (ref 0–44)
AST: 20 U/L (ref 15–41)
Albumin: 3.6 g/dL (ref 3.5–5.0)
Alkaline Phosphatase: 73 U/L (ref 38–126)
Anion gap: 5 (ref 5–15)
BUN: 16 mg/dL (ref 8–23)
CO2: 26 mmol/L (ref 22–32)
Calcium: 9 mg/dL (ref 8.9–10.3)
Chloride: 107 mmol/L (ref 98–111)
Creatinine: 0.92 mg/dL (ref 0.61–1.24)
GFR, Estimated: >60 mL/min (ref >60)
Glucose, Bld: 127 mg/dL — ABNORMAL HIGH (ref 70–99)
Potassium: 4 mmol/L (ref 3.5–5.1)
Sodium: 138 mmol/L (ref 135–145)
Total Bilirubin: 0.5 mg/dL (ref 0.3–1.2)
Total Protein: 6.6 g/dL (ref 6.5–8.1)

## 2021-05-02 LAB — CBC WITH DIFFERENTIAL (CANCER CENTER ONLY)
Abs Immature Granulocytes: 0.01 10*3/uL (ref 0.00–0.07)
Basophils Absolute: 0 10*3/uL (ref 0.0–0.1)
Basophils Relative: 0 %
Eosinophils Absolute: 0.1 10*3/uL (ref 0.0–0.5)
Eosinophils Relative: 2 %
HCT: 38.5 % — ABNORMAL LOW (ref 39.0–52.0)
Hemoglobin: 12.3 g/dL — ABNORMAL LOW (ref 13.0–17.0)
Immature Granulocytes: 0 %
Lymphocytes Relative: 21 %
Lymphs Abs: 1 10*3/uL (ref 0.7–4.0)
MCH: 27.4 pg (ref 26.0–34.0)
MCHC: 31.9 g/dL (ref 30.0–36.0)
MCV: 85.7 fL (ref 80.0–100.0)
Monocytes Absolute: 0.7 10*3/uL (ref 0.1–1.0)
Monocytes Relative: 15 %
Neutro Abs: 2.9 10*3/uL (ref 1.7–7.7)
Neutrophils Relative %: 62 %
Platelet Count: 102 10*3/uL — ABNORMAL LOW (ref 150–400)
RBC: 4.49 MIL/uL (ref 4.22–5.81)
RDW: 18.2 % — ABNORMAL HIGH (ref 11.5–15.5)
WBC Count: 4.8 10*3/uL (ref 4.0–10.5)
nRBC: 0 % (ref 0.0–0.2)

## 2021-05-02 LAB — CEA (ACCESS): CEA (CHCC): 2.86 ng/mL (ref 0.00–5.00)

## 2021-05-02 MED ORDER — DEXTROSE 5 % IV SOLN: Freq: Once | INTRAVENOUS | Status: AC

## 2021-05-02 MED ORDER — SODIUM CHLORIDE 0.9 % IV SOLN
10.0000 mg | Freq: Once | INTRAVENOUS | Status: AC
  Administered 2021-05-02: 10 mg via INTRAVENOUS
  Filled 2021-05-02: qty 1

## 2021-05-02 MED ORDER — OXALIPLATIN CHEMO INJECTION 100 MG/20ML
65.0000 mg/m2 | Freq: Once | INTRAVENOUS | Status: AC
  Administered 2021-05-02: 125 mg via INTRAVENOUS
  Filled 2021-05-02: qty 10

## 2021-05-02 MED ORDER — PALONOSETRON HCL INJECTION 0.25 MG/5ML
0.2500 mg | Freq: Once | INTRAVENOUS | Status: AC
  Administered 2021-05-02: 0.25 mg via INTRAVENOUS
  Filled 2021-05-02: qty 5

## 2021-05-02 MED ORDER — LEUCOVORIN CALCIUM INJECTION 350 MG
300.0000 mg/m2 | Freq: Once | INTRAVENOUS | Status: AC
  Administered 2021-05-02: 580 mg via INTRAVENOUS
  Filled 2021-05-02: qty 29

## 2021-05-02 MED ORDER — FLUOROURACIL CHEMO INJECTION 2.5 GM/50ML
300.0000 mg/m2 | Freq: Once | INTRAVENOUS | Status: AC
  Administered 2021-05-02: 600 mg via INTRAVENOUS
  Filled 2021-05-02: qty 12

## 2021-05-02 MED ORDER — SODIUM CHLORIDE 0.9 % IV SOLN
2000.0000 mg/m2 | INTRAVENOUS | Status: DC
  Administered 2021-05-02: 3850 mg via INTRAVENOUS
  Filled 2021-05-02: qty 77

## 2021-05-02 NOTE — Patient Instructions (Signed)
[place]   The chemotherapy medication bag should finish at 46 hours, 96 hours, or 7 days. For example, if your pump is scheduled for 46 hours and it was put on at 4:00 p.m., it should finish at 2:00 p.m. the day it is scheduled to come off regardless of your appointment time.     Estimated time to finish at 11:45 Wednesday, 05/04/21.   If the display on your pump reads "Low Volume" and it is beeping, take the batteries out of the pump and come to the cancer center for it to be taken off.   If the pump alarms go off prior to the pump reading "Low Volume" then call [phone] and someone can assist you.  If the plunger comes out and the chemotherapy medication is leaking out, please use your home chemo spill kit to clean up the spill. Do NOT use paper towels or other household products.  If you have problems or questions regarding your pump, please call either [phone] (24 hours a day) or the cancer center Monday-Friday 8:00 a.m.- 4:30 p.m. at the clinic number and we will assist you. If you are unable to get assistance, then go to the nearest Emergency Department and ask the staff to contact the IV team for assistance.  Discharge Instructions: Thank you for choosing [facility] to provide your oncology and hematology care.   If you have a lab appointment with the [facility], please go directly to the [facility] and check in at the registration area.   Wear comfortable clothing and clothing appropriate for easy access to any Portacath or PICC line.   We strive to give you quality time with your provider. You may need to reschedule your appointment if you arrive late (15 or more minutes).  Arriving late affects you and other patients whose appointments are after yours.  Also, if you miss three or more appointments without notifying the office, you may be dismissed from the clinic at the providers discretion.      For prescription refill  requests, have your pharmacy contact our office and allow 72 hours for refills to be completed.    Today you received the following chemotherapy and/or immunotherapy agents Oxaliplatin, leucovorin, fluorouracil      To help prevent nausea and vomiting after your treatment, we encourage you to take your nausea medication as directed.  BELOW ARE SYMPTOMS THAT SHOULD BE REPORTED IMMEDIATELY: *FEVER GREATER THAN 100.4 F (38 C) OR HIGHER *CHILLS OR SWEATING *NAUSEA AND VOMITING THAT IS NOT CONTROLLED WITH YOUR NAUSEA MEDICATION *UNUSUAL SHORTNESS OF BREATH *UNUSUAL BRUISING OR BLEEDING *URINARY PROBLEMS (pain or burning when urinating, or frequent urination) *BOWEL PROBLEMS (unusual diarrhea, constipation, pain near the anus) TENDERNESS IN MOUTH AND THROAT WITH OR WITHOUT PRESENCE OF ULCERS (sore throat, sores in mouth, or a toothache) UNUSUAL RASH, SWELLING OR PAIN  UNUSUAL VAGINAL DISCHARGE OR ITCHING   Items with * indicate a potential emergency and should be followed up as soon as possible or go to the Emergency Department if any problems should occur.  Please show the CHEMOTHERAPY ALERT CARD or IMMUNOTHERAPY ALERT CARD at check-in to the Emergency Department and triage nurse.  Should you have questions after your visit or need to cancel or reschedule your appointment, please contact [facility]  Dept: [phone]  and follow the prompts.  Office hours are 8:00 a.m. to 4:30 p.m. Monday - Friday. Please note that voicemails left after  4:00 p.m. may not be returned until the following business day.  We are closed weekends and major holidays. You have access to a nurse at all times for urgent questions. Please call the main number to the clinic Dept: [phone] and follow the prompts.   For any non-urgent questions, you may also contact your provider using MyChart. We now offer e-Visits for anyone 87 and older to request care online for non-urgent symptoms. For  details visit [email or web address].   Also download the MyChart app! Go to the app store, search "MyChart", open the app, select [facility], and log in with your MyChart username and password.  Due to Covid, a mask is required upon entering the hospital/clinic. If you do not have a mask, one will be given to you upon arrival. For doctor visits, patients may have 1 support person aged 23 or older with them. For treatment visits, patients cannot have anyone with them due to current Covid guidelines and our immunocompromised population.   Oxaliplatin Injection What is this medication? OXALIPLATIN (ox AL i PLA tin) is a chemotherapy drug. It targets fast dividing cells, like cancer cells, and causes these cells to die. This medicine is used to treat cancers of the colon and rectum, and many other cancers. This medicine may be used for other purposes; ask your health care provider or pharmacist if you have questions. COMMON BRAND NAME(S): Eloxatin What should I tell my care team before I take this medication? They need to know if you have any of these conditions: heart disease history of irregular heartbeat liver disease low blood counts, like white cells, platelets, or red blood cells lung or breathing disease, like asthma take medicines that treat or prevent blood clots tingling of the fingers or toes, or other nerve disorder an unusual or allergic reaction to oxaliplatin, other chemotherapy, other medicines, foods, dyes, or preservatives pregnant or trying to get pregnant breast-feeding How should I use this medication? This drug is given as an infusion into a vein. It is administered in a hospital or clinic by a specially trained health care professional. Talk to your pediatrician regarding the use of this medicine in children. Special care may be needed. Overdosage: If you think you have taken too much of this medicine contact a poison control center or emergency room at once. NOTE:  This medicine is only for you. Do not share this medicine with others. What if I miss a dose? It is important not to miss a dose. Call your doctor or health care professional if you are unable to keep an appointment. What may interact with this medication? Do not take this medicine with any of the following medications: cisapride dronedarone pimozide thioridazine This medicine may also interact with the following medications: aspirin and aspirin-like medicines certain medicines that treat or prevent blood clots like warfarin, apixaban, dabigatran, and rivaroxaban cisplatin cyclosporine diuretics medicines for infection like acyclovir, adefovir, amphotericin B, bacitracin, cidofovir, foscarnet, ganciclovir, gentamicin, pentamidine, vancomycin NSAIDs, medicines for pain and inflammation, like ibuprofen or naproxen other medicines that prolong the QT interval (an abnormal heart rhythm) pamidronate zoledronic acid This list may not describe all possible interactions. Give your health care provider a list of all the medicines, herbs, non-prescription drugs, or dietary supplements you use. Also tell them if you smoke, drink alcohol, or use illegal drugs. Some items may interact with your medicine. What should I watch for while using this medication? Your condition will be monitored carefully while you are receiving this medicine. You  may need blood work done while you are taking this medicine. This medicine may make you feel generally unwell. This is not uncommon as chemotherapy can affect healthy cells as well as cancer cells. Report any side effects. Continue your course of treatment even though you feel ill unless your healthcare professional tells you to stop. This medicine can make you more sensitive to cold. Do not drink cold drinks or use ice. Cover exposed skin before coming in contact with cold temperatures or cold objects. When out in cold weather wear warm clothing and cover your mouth  and nose to warm the air that goes into your lungs. Tell your doctor if you get sensitive to the cold. Do not become pregnant while taking this medicine or for 9 months after stopping it. Women should inform their health care professional if they wish to become pregnant or think they might be pregnant. Men should not father a child while taking this medicine and for 6 months after stopping it. There is potential for serious side effects to an unborn child. Talk to your health care professional for more information. Do not breast-feed a child while taking this medicine or for 3 months after stopping it. This medicine has caused ovarian failure in some women. This medicine may make it more difficult to get pregnant. Talk to your health care professional if you are concerned about your fertility. This medicine has caused decreased sperm counts in some men. This may make it more difficult to father a child. Talk to your health care professional if you are concerned about your fertility. This medicine may increase your risk of getting an infection. Call your health care professional for advice if you get a fever, chills, or sore throat, or other symptoms of a cold or flu. Do not treat yourself. Try to avoid being around people who are sick. Avoid taking medicines that contain aspirin, acetaminophen, ibuprofen, naproxen, or ketoprofen unless instructed by your health care professional. These medicines may hide a fever. Be careful brushing or flossing your teeth or using a toothpick because you may get an infection or bleed more easily. If you have any dental work done, tell your dentist you are receiving this medicine. What side effects may I notice from receiving this medication? Side effects that you should report to your doctor or health care professional as soon as possible: allergic reactions like skin rash, itching or hives, swelling of the face, lips, or tongue breathing problems cough low blood counts  - this medicine may decrease the number of white blood cells, red blood cells, and platelets. You may be at increased risk for infections and bleeding nausea, vomiting pain, redness, or irritation at site where injected pain, tingling, numbness in the hands or feet signs and symptoms of bleeding such as bloody or black, tarry stools; red or dark brown urine; spitting up blood or brown material that looks like coffee grounds; red spots on the skin; unusual bruising or bleeding from the eyes, gums, or nose signs and symptoms of a dangerous change in heartbeat or heart rhythm like chest pain; dizziness; fast, irregular heartbeat; palpitations; feeling faint or lightheaded; falls signs and symptoms of infection like fever; chills; cough; sore throat; pain or trouble passing urine signs and symptoms of liver injury like dark yellow or brown urine; general ill feeling or flu-like symptoms; light-colored stools; loss of appetite; nausea; right upper belly pain; unusually weak or tired; yellowing of the eyes or skin signs and symptoms of low red  blood cells or anemia such as unusually weak or tired; feeling faint or lightheaded; falls signs and symptoms of muscle injury like dark urine; trouble passing urine or change in the amount of urine; unusually weak or tired; muscle pain; back pain Side effects that usually do not require medical attention (report to your doctor or health care professional if they continue or are bothersome): changes in taste diarrhea gas hair loss loss of appetite mouth sores This list may not describe all possible side effects. Call your doctor for medical advice about side effects. You may report side effects to FDA at 1-800-FDA-1088. Where should I keep my medication? This drug is given in a hospital or clinic and will not be stored at home. NOTE: This sheet is a summary. It may not cover all possible information. If you have questions about this medicine, talk to your doctor,  pharmacist, or health care provider.  2022 [employer]vier/Gold Standard (2020-12-28 00:00:00)  Leucovorin injection What is this medication? LEUCOVORIN (loo koe VOR in) is used to prevent or treat the harmful effects of some medicines. This medicine is used to treat anemia caused by a low amount of folic acid in the body. It is also used with 5-fluorouracil (5-FU) to treat colon cancer. This medicine may be used for other purposes; ask your health care provider or pharmacist if you have questions. What should I tell my care team before I take this medication? They need to know if you have any of these conditions: anemia from low levels of vitamin B-12 in the blood an unusual or allergic reaction to leucovorin, folic acid, other medicines, foods, dyes, or preservatives pregnant or trying to get pregnant breast-feeding How should I use this medication? This medicine is for injection into a muscle or into a vein. It is given by a health care professional in a hospital or clinic setting. Talk to your pediatrician regarding the use of this medicine in children. Special care may be needed. Overdosage: If you think you have taken too much of this medicine contact a poison control center or emergency room at once. NOTE: This medicine is only for you. Do not share this medicine with others. What if I miss a dose? This does not apply. What may interact with this medication? capecitabine fluorouracil phenobarbital phenytoin primidone trimethoprim-sulfamethoxazole This list may not describe all possible interactions. Give your health care provider a list of all the medicines, herbs, non-prescription drugs, or dietary supplements you use. Also tell them if you smoke, drink alcohol, or use illegal drugs. Some items may interact with your medicine. What should I watch for while using this medication? Your condition will be monitored carefully while you are receiving this medicine. This medicine may  increase the side effects of 5-fluorouracil, 5-FU. Tell your doctor or health care professional if you have diarrhea or mouth sores that do not get better or that get worse. What side effects may I notice from receiving this medication? Side effects that you should report to your doctor or health care professional as soon as possible: allergic reactions like skin rash, itching or hives, swelling of the face, lips, or tongue breathing problems fever, infection mouth sores unusual bleeding or bruising unusually weak or tired Side effects that usually do not require medical attention (report to your doctor or health care professional if they continue or are bothersome): constipation or diarrhea loss of appetite nausea, vomiting This list may not describe all possible side effects. Call your doctor for medical advice about  side effects. You may report side effects to FDA at 1-800-FDA-1088. Where should I keep my medication? This drug is given in a hospital or clinic and will not be stored at home. NOTE: This sheet is a summary. It may not cover all possible information. If you have questions about this medicine, talk to your doctor, pharmacist, or health care provider.  2022 Elsevier/Gold Standard (2007-10-17 00:00:00)  Fluorouracil, 5-FU injection What is this medication? FLUOROURACIL, 5-FU (flure oh YOOR a sil) is a chemotherapy drug. It slows the growth of cancer cells. This medicine is used to treat many types of cancer like breast cancer, colon or rectal cancer, pancreatic cancer, and stomach cancer. This medicine may be used for other purposes; ask your health care provider or pharmacist if you have questions. COMMON BRAND NAME(S): Adrucil What should I tell my care team before I take this medication? They need to know if you have any of these conditions: blood disorders dihydropyrimidine dehydrogenase (DPD) deficiency infection (especially a virus infection such as chickenpox, cold  sores, or herpes) kidney disease liver disease malnourished, poor nutrition recent or ongoing radiation therapy an unusual or allergic reaction to fluorouracil, other chemotherapy, other medicines, foods, dyes, or preservatives pregnant or trying to get pregnant breast-feeding How should I use this medication? This drug is given as an infusion or injection into a vein. It is administered in a hospital or clinic by a specially trained health care professional. Talk to your pediatrician regarding the use of this medicine in children. Special care may be needed. Overdosage: If you think you have taken too much of this medicine contact a poison control center or emergency room at once. NOTE: This medicine is only for you. Do not share this medicine with others. What if I miss a dose? It is important not to miss your dose. Call your doctor or health care professional if you are unable to keep an appointment. What may interact with this medication? Do not take this medicine with any of the following medications: live virus vaccines This medicine may also interact with the following medications: medicines that treat or prevent blood clots like warfarin, enoxaparin, and dalteparin This list may not describe all possible interactions. Give your health care provider a list of all the medicines, herbs, non-prescription drugs, or dietary supplements you use. Also tell them if you smoke, drink alcohol, or use illegal drugs. Some items may interact with your medicine. What should I watch for while using this medication? Visit your doctor for checks on your progress. This drug may make you feel generally unwell. This is not uncommon, as chemotherapy can affect healthy cells as well as cancer cells. Report any side effects. Continue your course of treatment even though you feel ill unless your doctor tells you to stop. In some cases, you may be given additional medicines to help with side effects. Follow all  directions for their use. Call your doctor or health care professional for advice if you get a fever, chills or sore throat, or other symptoms of a cold or flu. Do not treat yourself. This drug decreases your body's ability to fight infections. Try to avoid being around people who are sick. This medicine may increase your risk to bruise or bleed. Call your doctor or health care professional if you notice any unusual bleeding. Be careful brushing and flossing your teeth or using a toothpick because you may get an infection or bleed more easily. If you have any dental work done, tell your  dentist you are receiving this medicine. Avoid taking products that contain aspirin, acetaminophen, ibuprofen, naproxen, or ketoprofen unless instructed by your doctor. These medicines may hide a fever. Do not become pregnant while taking this medicine. Women should inform their doctor if they wish to become pregnant or think they might be pregnant. There is a potential for serious side effects to an unborn child. Talk to your health care professional or pharmacist for more information. Do not breast-feed an infant while taking this medicine. Men should inform their doctor if they wish to father a child. This medicine may lower sperm counts. Do not treat diarrhea with over the counter products. Contact your doctor if you have diarrhea that lasts more than 2 days or if it is severe and watery. This medicine can make you more sensitive to the sun. Keep out of the sun. If you cannot avoid being in the sun, wear protective clothing and use sunscreen. Do not use sun lamps or tanning beds/booths. What side effects may I notice from receiving this medication? Side effects that you should report to your doctor or health care professional as soon as possible: allergic reactions like skin rash, itching or hives, swelling of the face, lips, or tongue low blood counts - this medicine may decrease the number of white blood cells, red  blood cells and platelets. You may be at increased risk for infections and bleeding. signs of infection - fever or chills, cough, sore throat, pain or difficulty passing urine signs of decreased platelets or bleeding - bruising, pinpoint red spots on the skin, black, tarry stools, blood in the urine signs of decreased red blood cells - unusually weak or tired, fainting spells, lightheadedness breathing problems changes in vision chest pain mouth sores nausea and vomiting pain, swelling, redness at site where injected pain, tingling, numbness in the hands or feet redness, swelling, or sores on hands or feet stomach pain unusual bleeding Side effects that usually do not require medical attention (report to your doctor or health care professional if they continue or are bothersome): changes in finger or toe nails diarrhea dry or itchy skin hair loss headache loss of appetite sensitivity of eyes to the light stomach upset unusually teary eyes This list may not describe all possible side effects. Call your doctor for medical advice about side effects. You may report side effects to FDA at 1-800-FDA-1088. Where should I keep my medication? This drug is given in a hospital or clinic and will not be stored at home. NOTE: This sheet is a summary. It may not cover all possible information. If you have questions about this medicine, talk to your doctor, pharmacist, or health care provider.  2022 [employer]vier/Gold Standard (2020-12-28 00:00:00)

## 2021-05-02 NOTE — Progress Notes (Signed)
[facility] OFFICE PROGRESS NOTE   Diagnosis: Colon cancer  INTERVAL HISTORY:   [name] returns as scheduled.  He completed cycle 5 FOLFOX 04/19/2021.  He denies nausea/vomiting.  No mouth sores.  No diarrhea.  He has mild persistent cold sensitivity.  No numbness or tingling in the absence of cold exposure.  Good appetite.  No abdominal pain.  Objective:  Vital signs in last 24 hours:  Blood pressure 134/60, pulse 78, temperature 97.9 F (36.6 C), temperature source Oral, resp. rate 18, height 5' 8"  (1.727 m), weight 174 lb 12.8 oz (79.3 kg), SpO2 100 %.    HEENT: No thrush or ulcers. Resp: Lungs clear bilaterally. Cardio: Regular rate and rhythm. GI: Abdomen soft and nontender.  No hepatosplenomegaly. Vascular: No leg edema. Neuro: Vibratory sense moderately decreased over the fingertips per tuning fork exam. Skin: Palms without erythema. Port-A-Cath without erythema.   Lab Results:  Lab Results  Component Value Date   WBC 4.8 05/02/2021   HGB 12.3 (L) 05/02/2021   HCT 38.5 (L) 05/02/2021   MCV 85.7 05/02/2021   PLT 102 (L) 05/02/2021   NEUTROABS 2.9 05/02/2021    Imaging:  No results found.  Medications: I have reviewed the patient's current medications.  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), transverse colon cancer stage IIB (T4aN0) status post extended right hemicolectomy 05/07/2019, MSS Tumor invasive superficial portion of the muscle ("internal third "), no tumor perforation, no vascular invasion or perineural invasion, negative resection margins, 0/4 lymph nodes Elevated CEA 02/10/2019 CTs 03/05/2019, compared to outside CTs from 11/22/2018-worsened wall thickening in the cecum stable apple core lesion in the mid transverse colon, no evidence of recurrent tumor at the distal colonic anastomosis, no evidence of metastatic  disease Colonoscopy 03/24/2019 20-2 malignant appearing masses noted in the colon, 1 filled the cecum measuring approximately 4 cm across and friable.  The other malignant appearing mass was circumferential creating a stricture with a 1.2 cm lumen located in the transverse segment approximately 4 to 5 cm in length.  There were 12-18 neoplastic appearing polyps in between the 2 obvious malignant masses ranging in size from 3 mm to 2 cm.  5 polyps distal to the transverse colon mass.  2 sigmoid colon polyps.  Cecum mass positive for adenocarcinoma.  Transverse colon mass positive for adenocarcinoma. 05/07/2019 laparoscopic extended right hemicolectomy, lysis of adhesions-5 cm invasive moderately differentiated adenocarcinoma involving cecum and proximal ascending colon, carcinoma invades into the pericolonic soft tissue; 7 cm invasive moderately differentiated carcinoma involving the transverse colon, carcinoma invades into the serosal surface (visceral peritoneum); all resection margins negative for carcinoma.  Lymphovascular invasion present.  In the proximal colon metastatic carcinoma involves 3 of 17 lymph nodes with 1 tumor deposit.  In the transverse colon 12 lymph nodes negative for metastatic carcinoma.  3 separate polyps: Tubular adenoma, inflammatory polyp and hyperplastic polyp Cycle 1 capecitabine 06/02/2019 Cycle 2 capecitabine 06/23/2019 Cycle 3 capecitabine 07/14/2019 Cycle 4 capecitabine 08/04/2019 Cycle 5 capecitabine 08/25/2019 Cycle 6 capecitabine 09/15/2019 Cycle 7 capecitabine 10/06/2019 Cycle 8 capecitabine 10/27/2019 Upper endoscopy 07/23/2020-negative Colonoscopy 07/23/2020-normal-appearing anastomoses, polyp removed from the descending colon-tubular adenoma CTs 01/18/2021-right abdominal mesenteric mass, mass in the low anatomic pelvis consistent with metastases Cycle 1 FOLFOX 02/21/2021 Cycle 2 FOLFOX 03/07/2021, dose reductions made due to mild neutropenia Cycle 3 FOLFOX 03/21/2021 Cycle 4  FOLFOX 04/04/2021 Cycle 5 FOLFOX 04/19/2021 Cycle 6 FOLFOX 05/02/2021  2.   Microcytic anemia secondary to #1.    Resolved 3.   Family history of pancreas and colon cancer, negative genetic testing per the [facility] panel, ATM variant of unknown significance      Disposition: [name] appears well.  He has completed 5 cycles of FOLFOX.  Plan to proceed with cycle 6 today as scheduled.  Restaging CTs prior to next office visit.  CBC and chemistry panel from today reviewed.  Labs adequate to proceed with treatment.  He has mild thrombocytopenia.  He understands to contact the office with bleeding.  He will return for lab, follow-up, next cycle of FOLFOX in 2 weeks.  We are available to see him sooner if needed.   Ned Card ANP/GNP-BC   05/02/2021  9:36 AM

## 2021-05-02 NOTE — Progress Notes (Signed)
°  Pt accompanied by Interpreter from [facility], Bonita Quin.       Patient presents for treatment. RN assessment completed along with the following:  Labs/vitals reviewed - Yes, and within treatment parameters.   Weight within 10% of previous measurement - Yes Oncology Treatment Attestation completed for current therapy- Yes, on date 02/09/21 Informed consent completed and reflects current therapy/intent - Yes, on date 02/21/21             Provider progress note reviewed - Yes, today's provider note was reviewed. Treatment/Antibody/Supportive plan reviewed - Yes, and there are no adjustments needed for today's treatment. S&H and other orders reviewed - Yes, and there are no additional orders identified. Previous treatment date reviewed - Yes, and the appropriate amount of time has elapsed between treatments. Clinic Hand Off Received from - none   Patient to proceed with treatment.

## 2021-05-02 NOTE — Progress Notes (Signed)
Patient seen by Lisa Thomas NP today  Vitals are within treatment parameters.  Labs reviewed by Lisa Thomas NP and are within treatment parameters.  Per physician team, patient is ready for treatment and there are NO modifications to the treatment plan.     

## 2021-05-04 ENCOUNTER — Inpatient Hospital Stay: Payer: 59

## 2021-05-04 ENCOUNTER — Other Ambulatory Visit: Payer: Self-pay

## 2021-05-04 VITALS — BP 136/56 | HR 69 | Temp 98.4°F | Resp 18

## 2021-05-04 DIAGNOSIS — Z5111 Encounter for antineoplastic chemotherapy: Secondary | ICD-10-CM | POA: Diagnosis not present

## 2021-05-04 DIAGNOSIS — C189 Malignant neoplasm of colon, unspecified: Secondary | ICD-10-CM

## 2021-05-04 MED ORDER — HEPARIN SOD (PORK) LOCK FLUSH 100 UNIT/ML IV SOLN
500.0000 [IU] | Freq: Once | INTRAVENOUS | Status: AC | PRN
  Administered 2021-05-04: 500 [IU]

## 2021-05-04 MED ORDER — SODIUM CHLORIDE 0.9% FLUSH
10.0000 mL | INTRAVENOUS | Status: DC | PRN
  Administered 2021-05-04: 10 mL

## 2021-05-12 ENCOUNTER — Encounter (HOSPITAL_BASED_OUTPATIENT_CLINIC_OR_DEPARTMENT_OTHER): Payer: Self-pay

## 2021-05-12 ENCOUNTER — Other Ambulatory Visit: Payer: Self-pay

## 2021-05-12 ENCOUNTER — Ambulatory Visit (HOSPITAL_BASED_OUTPATIENT_CLINIC_OR_DEPARTMENT_OTHER)
Admission: RE | Admit: 2021-05-12 | Discharge: 2021-05-12 | Disposition: A | Discharge status: Home / Self Care | Payer: 59 | Source: Ambulatory Visit | Attending: Nurse Practitioner | Admitting: Nurse Practitioner

## 2021-05-12 DIAGNOSIS — C189 Malignant neoplasm of colon, unspecified: Secondary | ICD-10-CM | POA: Insufficient documentation

## 2021-05-12 MED ORDER — IOHEXOL 300 MG/ML  SOLN
85.0000 mL | Freq: Once | INTRAMUSCULAR | Status: AC | PRN
  Administered 2021-05-12: 85 mL via INTRAVENOUS

## 2021-05-15 ENCOUNTER — Other Ambulatory Visit: Payer: Self-pay | Admitting: Oncology

## 2021-05-16 ENCOUNTER — Inpatient Hospital Stay: Payer: 59

## 2021-05-16 ENCOUNTER — Inpatient Hospital Stay (HOSPITAL_BASED_OUTPATIENT_CLINIC_OR_DEPARTMENT_OTHER): Payer: 59 | Admitting: Oncology

## 2021-05-16 ENCOUNTER — Other Ambulatory Visit: Payer: Self-pay

## 2021-05-16 ENCOUNTER — Encounter: Payer: Self-pay | Admitting: *Deleted

## 2021-05-16 VITALS — BP 149/64 | HR 63

## 2021-05-16 VITALS — BP 140/63 | HR 73 | Temp 97.8°F | Resp 18 | Ht 68.0 in | Wt 173.0 lb

## 2021-05-16 DIAGNOSIS — C187 Malignant neoplasm of sigmoid colon: Secondary | ICD-10-CM

## 2021-05-16 DIAGNOSIS — C189 Malignant neoplasm of colon, unspecified: Secondary | ICD-10-CM

## 2021-05-16 DIAGNOSIS — Z5111 Encounter for antineoplastic chemotherapy: Secondary | ICD-10-CM | POA: Diagnosis not present

## 2021-05-16 LAB — CMP (CANCER CENTER ONLY)
ALT: 17 U/L (ref 0–44)
AST: 25 U/L (ref 15–41)
Albumin: 3.7 g/dL (ref 3.5–5.0)
Alkaline Phosphatase: 75 U/L (ref 38–126)
Anion gap: 8 (ref 5–15)
BUN: 19 mg/dL (ref 8–23)
CO2: 25 mmol/L (ref 22–32)
Calcium: 9.2 mg/dL (ref 8.9–10.3)
Chloride: 106 mmol/L (ref 98–111)
Creatinine: 0.9 mg/dL (ref 0.61–1.24)
GFR, Estimated: >60 mL/min (ref >60)
Glucose, Bld: 113 mg/dL — ABNORMAL HIGH (ref 70–99)
Potassium: 4.1 mmol/L (ref 3.5–5.1)
Sodium: 139 mmol/L (ref 135–145)
Total Bilirubin: 0.4 mg/dL (ref 0.3–1.2)
Total Protein: 6.8 g/dL (ref 6.5–8.1)

## 2021-05-16 LAB — CBC WITH DIFFERENTIAL (CANCER CENTER ONLY)
Abs Immature Granulocytes: 0 10*3/uL (ref 0.00–0.07)
Basophils Absolute: 0 10*3/uL (ref 0.0–0.1)
Basophils Relative: 1 %
Eosinophils Absolute: 0.1 10*3/uL (ref 0.0–0.5)
Eosinophils Relative: 2 %
HCT: 38.3 % — ABNORMAL LOW (ref 39.0–52.0)
Hemoglobin: 12.3 g/dL — ABNORMAL LOW (ref 13.0–17.0)
Immature Granulocytes: 0 %
Lymphocytes Relative: 30 %
Lymphs Abs: 1.1 10*3/uL (ref 0.7–4.0)
MCH: 27.4 pg (ref 26.0–34.0)
MCHC: 32.1 g/dL (ref 30.0–36.0)
MCV: 85.3 fL (ref 80.0–100.0)
Monocytes Absolute: 0.7 10*3/uL (ref 0.1–1.0)
Monocytes Relative: 19 %
Neutro Abs: 1.8 10*3/uL (ref 1.7–7.7)
Neutrophils Relative %: 48 %
Platelet Count: 116 10*3/uL — ABNORMAL LOW (ref 150–400)
RBC: 4.49 MIL/uL (ref 4.22–5.81)
RDW: 18 % — ABNORMAL HIGH (ref 11.5–15.5)
WBC Count: 3.7 10*3/uL — ABNORMAL LOW (ref 4.0–10.5)
nRBC: 0 % (ref 0.0–0.2)

## 2021-05-16 LAB — CEA (ACCESS): CEA (CHCC): 3.79 ng/mL (ref 0.00–5.00)

## 2021-05-16 MED ORDER — FLUOROURACIL CHEMO INJECTION 2.5 GM/50ML
300.0000 mg/m2 | Freq: Once | INTRAVENOUS | Status: AC
  Administered 2021-05-16: 600 mg via INTRAVENOUS
  Filled 2021-05-16: qty 12

## 2021-05-16 MED ORDER — OXALIPLATIN CHEMO INJECTION 100 MG/20ML
65.0000 mg/m2 | Freq: Once | INTRAVENOUS | Status: AC
  Administered 2021-05-16: 125 mg via INTRAVENOUS
  Filled 2021-05-16: qty 20

## 2021-05-16 MED ORDER — SODIUM CHLORIDE 0.9 % IV SOLN
10.0000 mg | Freq: Once | INTRAVENOUS | Status: AC
  Administered 2021-05-16: 10 mg via INTRAVENOUS
  Filled 2021-05-16: qty 1

## 2021-05-16 MED ORDER — SODIUM CHLORIDE 0.9 % IV SOLN
2000.0000 mg/m2 | INTRAVENOUS | Status: DC
  Administered 2021-05-16: 3850 mg via INTRAVENOUS
  Filled 2021-05-16: qty 77

## 2021-05-16 MED ORDER — LEUCOVORIN CALCIUM INJECTION 350 MG
300.0000 mg/m2 | Freq: Once | INTRAVENOUS | Status: AC
  Administered 2021-05-16: 580 mg via INTRAVENOUS
  Filled 2021-05-16: qty 25

## 2021-05-16 MED ORDER — PALONOSETRON HCL INJECTION 0.25 MG/5ML
0.2500 mg | Freq: Once | INTRAVENOUS | Status: AC
  Administered 2021-05-16: 0.25 mg via INTRAVENOUS
  Filled 2021-05-16: qty 5

## 2021-05-16 MED ORDER — DEXTROSE 5 % IV SOLN: Freq: Once | INTRAVENOUS | Status: AC

## 2021-05-16 NOTE — Patient Instructions (Addendum)
[facility]   The chemotherapy medication bag should finish at 46 hours, 96 hours, or 7 days. For example, if your pump is scheduled for 46 hours and it was put on at 4:00 p.m., it should finish at 2:00 p.m. the day it is scheduled to come off regardless of your appointment time.     Estimated time to finish at 12:00  Wednesday, 05/18/2021.   If the display on your pump reads "Low Volume" and it is beeping, take the batteries out of the pump and come to the cancer center for it to be taken off.   If the pump alarms go off prior to the pump reading "Low Volume" then call [phone] and someone can assist you.  If the plunger comes out and the chemotherapy medication is leaking out, please use your home chemo spill kit to clean up the spill. Do NOT use paper towels or other household products.  If you have problems or questions regarding your pump, please call either [phone] (24 hours a day) or the cancer center Monday-Friday 8:00 a.m.- 4:30 p.m. at the clinic number and we will assist you. If you are unable to get assistance, then go to the nearest Emergency Department and ask the staff to contact the IV team for assistance.  Discharge Instructions: Thank you for choosing [facility] to provide your oncology and hematology care.   If you have a lab appointment with the [facility], please go directly to the [facility] and check in at the registration area.   Wear comfortable clothing and clothing appropriate for easy access to any Portacath or PICC line.   We strive to give you quality time with your provider. You may need to reschedule your appointment if you arrive late (15 or more minutes).  Arriving late affects you and other patients whose appointments are after yours.  Also, if you miss three or more appointments without notifying the office, you may be dismissed from the clinic at the providers discretion.      For prescription  refill requests, have your pharmacy contact our office and allow 72 hours for refills to be completed.    Today you received the following chemotherapy and/or immunotherapy agents Oxaliplatin, leucovorin, fluorouracl      To help prevent nausea and vomiting after your treatment, we encourage you to take your nausea medication as directed.  BELOW ARE SYMPTOMS THAT SHOULD BE REPORTED IMMEDIATELY: *FEVER GREATER THAN 100.4 F (38 C) OR HIGHER *CHILLS OR SWEATING *NAUSEA AND VOMITING THAT IS NOT CONTROLLED WITH YOUR NAUSEA MEDICATION *UNUSUAL SHORTNESS OF BREATH *UNUSUAL BRUISING OR BLEEDING *URINARY PROBLEMS (pain or burning when urinating, or frequent urination) *BOWEL PROBLEMS (unusual diarrhea, constipation, pain near the anus) TENDERNESS IN MOUTH AND THROAT WITH OR WITHOUT PRESENCE OF ULCERS (sore throat, sores in mouth, or a toothache) UNUSUAL RASH, SWELLING OR PAIN  UNUSUAL VAGINAL DISCHARGE OR ITCHING   Items with * indicate a potential emergency and should be followed up as soon as possible or go to the Emergency Department if any problems should occur.  Please show the CHEMOTHERAPY ALERT CARD or IMMUNOTHERAPY ALERT CARD at check-in to the Emergency Department and triage nurse.  Should you have questions after your visit or need to cancel or reschedule your appointment, please contact [facility]  Dept: [phone]  and follow the prompts.  Office hours are 8:00 a.m. to 4:30 p.m. Monday - Friday. Please note that voicemails left  after 4:00 p.m. may not be returned until the following business day.  We are closed weekends and major holidays. You have access to a nurse at all times for urgent questions. Please call the main number to the clinic Dept: [phone] and follow the prompts.   For any non-urgent questions, you may also contact your provider using MyChart. We now offer e-Visits for anyone 59 and older to request care online for non-urgent symptoms.  For details visit [email or web address].   Also download the MyChart app! Go to the app store, search "MyChart", open the app, select [facility], and log in with your MyChart username and password.  Due to Covid, a mask is required upon entering the hospital/clinic. If you do not have a mask, one will be given to you upon arrival. For doctor visits, patients may have 1 support person aged 51 or older with them. For treatment visits, patients cannot have anyone with them due to current Covid guidelines and our immunocompromised population.   Oxaliplatin Injection What is this medication? OXALIPLATIN (ox AL i PLA tin) is a chemotherapy drug. It targets fast dividing cells, like cancer cells, and causes these cells to die. This medicine is used to treat cancers of the colon and rectum, and many other cancers. This medicine may be used for other purposes; ask your health care provider or pharmacist if you have questions. COMMON BRAND NAME(S): Eloxatin What should I tell my care team before I take this medication? They need to know if you have any of these conditions: heart disease history of irregular heartbeat liver disease low blood counts, like white cells, platelets, or red blood cells lung or breathing disease, like asthma take medicines that treat or prevent blood clots tingling of the fingers or toes, or other nerve disorder an unusual or allergic reaction to oxaliplatin, other chemotherapy, other medicines, foods, dyes, or preservatives pregnant or trying to get pregnant breast-feeding How should I use this medication? This drug is given as an infusion into a vein. It is administered in a hospital or clinic by a specially trained health care professional. Talk to your pediatrician regarding the use of this medicine in children. Special care may be needed. Overdosage: If you think you have taken too much of this medicine contact a poison control center or emergency room at once. NOTE:  This medicine is only for you. Do not share this medicine with others. What if I miss a dose? It is important not to miss a dose. Call your doctor or health care professional if you are unable to keep an appointment. What may interact with this medication? Do not take this medicine with any of the following medications: cisapride dronedarone pimozide thioridazine This medicine may also interact with the following medications: aspirin and aspirin-like medicines certain medicines that treat or prevent blood clots like warfarin, apixaban, dabigatran, and rivaroxaban cisplatin cyclosporine diuretics medicines for infection like acyclovir, adefovir, amphotericin B, bacitracin, cidofovir, foscarnet, ganciclovir, gentamicin, pentamidine, vancomycin NSAIDs, medicines for pain and inflammation, like ibuprofen or naproxen other medicines that prolong the QT interval (an abnormal heart rhythm) pamidronate zoledronic acid This list may not describe all possible interactions. Give your health care provider a list of all the medicines, herbs, non-prescription drugs, or dietary supplements you use. Also tell them if you smoke, drink alcohol, or use illegal drugs. Some items may interact with your medicine. What should I watch for while using this medication? Your condition will be monitored carefully while you are receiving this medicine.  You may need blood work done while you are taking this medicine. This medicine may make you feel generally unwell. This is not uncommon as chemotherapy can affect healthy cells as well as cancer cells. Report any side effects. Continue your course of treatment even though you feel ill unless your healthcare professional tells you to stop. This medicine can make you more sensitive to cold. Do not drink cold drinks or use ice. Cover exposed skin before coming in contact with cold temperatures or cold objects. When out in cold weather wear warm clothing and cover your mouth  and nose to warm the air that goes into your lungs. Tell your doctor if you get sensitive to the cold. Do not become pregnant while taking this medicine or for 9 months after stopping it. Women should inform their health care professional if they wish to become pregnant or think they might be pregnant. Men should not father a child while taking this medicine and for 6 months after stopping it. There is potential for serious side effects to an unborn child. Talk to your health care professional for more information. Do not breast-feed a child while taking this medicine or for 3 months after stopping it. This medicine has caused ovarian failure in some women. This medicine may make it more difficult to get pregnant. Talk to your health care professional if you are concerned about your fertility. This medicine has caused decreased sperm counts in some men. This may make it more difficult to father a child. Talk to your health care professional if you are concerned about your fertility. This medicine may increase your risk of getting an infection. Call your health care professional for advice if you get a fever, chills, or sore throat, or other symptoms of a cold or flu. Do not treat yourself. Try to avoid being around people who are sick. Avoid taking medicines that contain aspirin, acetaminophen, ibuprofen, naproxen, or ketoprofen unless instructed by your health care professional. These medicines may hide a fever. Be careful brushing or flossing your teeth or using a toothpick because you may get an infection or bleed more easily. If you have any dental work done, tell your dentist you are receiving this medicine. What side effects may I notice from receiving this medication? Side effects that you should report to your doctor or health care professional as soon as possible: allergic reactions like skin rash, itching or hives, swelling of the face, lips, or tongue breathing problems cough low blood counts  - this medicine may decrease the number of white blood cells, red blood cells, and platelets. You may be at increased risk for infections and bleeding nausea, vomiting pain, redness, or irritation at site where injected pain, tingling, numbness in the hands or feet signs and symptoms of bleeding such as bloody or black, tarry stools; red or dark brown urine; spitting up blood or brown material that looks like coffee grounds; red spots on the skin; unusual bruising or bleeding from the eyes, gums, or nose signs and symptoms of a dangerous change in heartbeat or heart rhythm like chest pain; dizziness; fast, irregular heartbeat; palpitations; feeling faint or lightheaded; falls signs and symptoms of infection like fever; chills; cough; sore throat; pain or trouble passing urine signs and symptoms of liver injury like dark yellow or brown urine; general ill feeling or flu-like symptoms; light-colored stools; loss of appetite; nausea; right upper belly pain; unusually weak or tired; yellowing of the eyes or skin signs and symptoms of low  red blood cells or anemia such as unusually weak or tired; feeling faint or lightheaded; falls signs and symptoms of muscle injury like dark urine; trouble passing urine or change in the amount of urine; unusually weak or tired; muscle pain; back pain Side effects that usually do not require medical attention (report to your doctor or health care professional if they continue or are bothersome): changes in taste diarrhea gas hair loss loss of appetite mouth sores This list may not describe all possible side effects. Call your doctor for medical advice about side effects. You may report side effects to FDA at 1-800-FDA-1088. Where should I keep my medication? This drug is given in a hospital or clinic and will not be stored at home. NOTE: This sheet is a summary. It may not cover all possible information. If you have questions about this medicine, talk to your doctor,  pharmacist, or health care provider.  2022 Elsevier/Gold Standard (2020-12-28 00:00:00)  Leucovorin injection What is this medication? LEUCOVORIN (loo koe VOR in) is used to prevent or treat the harmful effects of some medicines. This medicine is used to treat anemia caused by a low amount of folic acid in the body. It is also used with 5-fluorouracil (5-FU) to treat colon cancer. This medicine may be used for other purposes; ask your health care provider or pharmacist if you have questions. What should I tell my care team before I take this medication? They need to know if you have any of these conditions: anemia from low levels of vitamin B-12 in the blood an unusual or allergic reaction to leucovorin, folic acid, other medicines, foods, dyes, or preservatives pregnant or trying to get pregnant breast-feeding How should I use this medication? This medicine is for injection into a muscle or into a vein. It is given by a health care professional in a hospital or clinic setting. Talk to your pediatrician regarding the use of this medicine in children. Special care may be needed. Overdosage: If you think you have taken too much of this medicine contact a poison control center or emergency room at once. NOTE: This medicine is only for you. Do not share this medicine with others. What if I miss a dose? This does not apply. What may interact with this medication? capecitabine fluorouracil phenobarbital phenytoin primidone trimethoprim-sulfamethoxazole This list may not describe all possible interactions. Give your health care provider a list of all the medicines, herbs, non-prescription drugs, or dietary supplements you use. Also tell them if you smoke, drink alcohol, or use illegal drugs. Some items may interact with your medicine. What should I watch for while using this medication? Your condition will be monitored carefully while you are receiving this medicine. This medicine may  increase the side effects of 5-fluorouracil, 5-FU. Tell your doctor or health care professional if you have diarrhea or mouth sores that do not get better or that get worse. What side effects may I notice from receiving this medication? Side effects that you should report to your doctor or health care professional as soon as possible: allergic reactions like skin rash, itching or hives, swelling of the face, lips, or tongue breathing problems fever, infection mouth sores unusual bleeding or bruising unusually weak or tired Side effects that usually do not require medical attention (report to your doctor or health care professional if they continue or are bothersome): constipation or diarrhea loss of appetite nausea, vomiting This list may not describe all possible side effects. Call your doctor for medical advice  about side effects. You may report side effects to FDA at 1-800-FDA-1088. Where should I keep my medication? This drug is given in a hospital or clinic and will not be stored at home. NOTE: This sheet is a summary. It may not cover all possible information. If you have questions about this medicine, talk to your doctor, pharmacist, or health care provider.  2022 Elsevier/Gold Standard (2007-10-17 00:00:00)  Fluorouracil, 5-FU injection What is this medication? FLUOROURACIL, 5-FU (flure oh YOOR a sil) is a chemotherapy drug. It slows the growth of cancer cells. This medicine is used to treat many types of cancer like breast cancer, colon or rectal cancer, pancreatic cancer, and stomach cancer. This medicine may be used for other purposes; ask your health care provider or pharmacist if you have questions. COMMON BRAND NAME(S): Adrucil What should I tell my care team before I take this medication? They need to know if you have any of these conditions: blood disorders dihydropyrimidine dehydrogenase (DPD) deficiency infection (especially a virus infection such as chickenpox, cold  sores, or herpes) kidney disease liver disease malnourished, poor nutrition recent or ongoing radiation therapy an unusual or allergic reaction to fluorouracil, other chemotherapy, other medicines, foods, dyes, or preservatives pregnant or trying to get pregnant breast-feeding How should I use this medication? This drug is given as an infusion or injection into a vein. It is administered in a hospital or clinic by a specially trained health care professional. Talk to your pediatrician regarding the use of this medicine in children. Special care may be needed. Overdosage: If you think you have taken too much of this medicine contact a poison control center or emergency room at once. NOTE: This medicine is only for you. Do not share this medicine with others. What if I miss a dose? It is important not to miss your dose. Call your doctor or health care professional if you are unable to keep an appointment. What may interact with this medication? Do not take this medicine with any of the following medications: live virus vaccines This medicine may also interact with the following medications: medicines that treat or prevent blood clots like warfarin, enoxaparin, and dalteparin This list may not describe all possible interactions. Give your health care provider a list of all the medicines, herbs, non-prescription drugs, or dietary supplements you use. Also tell them if you smoke, drink alcohol, or use illegal drugs. Some items may interact with your medicine. What should I watch for while using this medication? Visit your doctor for checks on your progress. This drug may make you feel generally unwell. This is not uncommon, as chemotherapy can affect healthy cells as well as cancer cells. Report any side effects. Continue your course of treatment even though you feel ill unless your doctor tells you to stop. In some cases, you may be given additional medicines to help with side effects. Follow all  directions for their use. Call your doctor or health care professional for advice if you get a fever, chills or sore throat, or other symptoms of a cold or flu. Do not treat yourself. This drug decreases your body's ability to fight infections. Try to avoid being around people who are sick. This medicine may increase your risk to bruise or bleed. Call your doctor or health care professional if you notice any unusual bleeding. Be careful brushing and flossing your teeth or using a toothpick because you may get an infection or bleed more easily. If you have any dental work done, tell  your dentist you are receiving this medicine. Avoid taking products that contain aspirin, acetaminophen, ibuprofen, naproxen, or ketoprofen unless instructed by your doctor. These medicines may hide a fever. Do not become pregnant while taking this medicine. Women should inform their doctor if they wish to become pregnant or think they might be pregnant. There is a potential for serious side effects to an unborn child. Talk to your health care professional or pharmacist for more information. Do not breast-feed an infant while taking this medicine. Men should inform their doctor if they wish to father a child. This medicine may lower sperm counts. Do not treat diarrhea with over the counter products. Contact your doctor if you have diarrhea that lasts more than 2 days or if it is severe and watery. This medicine can make you more sensitive to the sun. Keep out of the sun. If you cannot avoid being in the sun, wear protective clothing and use sunscreen. Do not use sun lamps or tanning beds/booths. What side effects may I notice from receiving this medication? Side effects that you should report to your doctor or health care professional as soon as possible: allergic reactions like skin rash, itching or hives, swelling of the face, lips, or tongue low blood counts - this medicine may decrease the number of white blood cells, red  blood cells and platelets. You may be at increased risk for infections and bleeding. signs of infection - fever or chills, cough, sore throat, pain or difficulty passing urine signs of decreased platelets or bleeding - bruising, pinpoint red spots on the skin, black, tarry stools, blood in the urine signs of decreased red blood cells - unusually weak or tired, fainting spells, lightheadedness breathing problems changes in vision chest pain mouth sores nausea and vomiting pain, swelling, redness at site where injected pain, tingling, numbness in the hands or feet redness, swelling, or sores on hands or feet stomach pain unusual bleeding Side effects that usually do not require medical attention (report to your doctor or health care professional if they continue or are bothersome): changes in finger or toe nails diarrhea dry or itchy skin hair loss headache loss of appetite sensitivity of eyes to the light stomach upset unusually teary eyes This list may not describe all possible side effects. Call your doctor for medical advice about side effects. You may report side effects to FDA at 1-800-FDA-1088. Where should I keep my medication? This drug is given in a hospital or clinic and will not be stored at home. NOTE: This sheet is a summary. It may not cover all possible information. If you have questions about this medicine, talk to your doctor, pharmacist, or health care provider.  2022 Elsevier/Gold Standard (2020-12-28 00:00:00)

## 2021-05-16 NOTE — Progress Notes (Signed)
Patient seen by Dr. Sherrill today ? ?Vitals are within treatment parameters. ? ?Labs reviewed by Dr. Sherrill and are within treatment parameters. ? ?Per physician team, patient is ready for treatment and there are NO modifications to the treatment plan.  ?

## 2021-05-16 NOTE — Progress Notes (Signed)
Patient presents for treatment. RN assessment completed along with the following:  Labs/vitals reviewed - Yes, and within treatment parameters.   Weight within 10% of previous measurement - Yes Informed consent completed and reflects current therapy/intent - Yes, on date 02/21/21             Provider progress note reviewed - Yes, today's provider note was reviewed. Treatment/Antibody/Supportive plan reviewed - Yes, and there are no adjustments needed for today's treatment. S&H and other orders reviewed - Yes, and there are no additional orders identified. Previous treatment date reviewed - Yes, and the appropriate amount of time has elapsed between treatments. Clinic Hand Off Received from - Cristy Friedlander, RN  Patient to proceed with treatment.  Marianna Fuss  Interpreter at bedside from [facility]

## 2021-05-16 NOTE — Progress Notes (Signed)
[facility] OFFICE PROGRESS NOTE   Diagnosis: Colon cancer  INTERVAL HISTORY:   [name] returns as scheduled.  He is here today with his son and a Spanish interpreter.  He completed another cycle of FOLFOX on 05/02/2021.  He has cold sensitivity following chemotherapy. No neuropathy symptoms at present. Mild malaise. Objective:  Vital signs in last 24 hours:  Blood pressure 140/63, pulse 73, temperature 97.8 F (36.6 C), temperature source Oral, resp. rate 18, height 5' 8"  (1.727 m), weight 173 lb (78.5 kg), SpO2 100 %.    HEENT: No thrush or ulcers Resp: Lungs clear bilaterally Cardio: Regular rate and rhythm GI: No mass, nontender, no hepatosplenomegaly Vascular: No leg edema Neuro: Mild to moderate loss of vibratory sense at the fingertips bilaterally    Portacath/PICC-without erythema  Lab Results:  Lab Results  Component Value Date   WBC 3.7 (L) 05/16/2021   HGB 12.3 (L) 05/16/2021   HCT 38.3 (L) 05/16/2021   MCV 85.3 05/16/2021   PLT 116 (L) 05/16/2021   NEUTROABS 1.8 05/16/2021    CMP  Lab Results  Component Value Date   NA 138 05/02/2021   K 4.0 05/02/2021   CL 107 05/02/2021   CO2 26 05/02/2021   GLUCOSE 127 (H) 05/02/2021   BUN 16 05/02/2021   CREATININE 0.92 05/02/2021   CALCIUM 9.0 05/02/2021   PROT 6.6 05/02/2021   ALBUMIN 3.6 05/02/2021   AST 20 05/02/2021   ALT 14 05/02/2021   ALKPHOS 73 05/02/2021   BILITOT 0.5 05/02/2021   GFRNONAA >60 05/02/2021   GFRAA >60 12/01/2019    Lab Results  Component Value Date   CEA1 12.10 (H) 01/03/2021   CEA 2.86 05/02/2021     Medications: I have reviewed the patient's current medications.   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), transverse colon cancer stage IIB (T4aN0) status post extended right hemicolectomy 05/07/2019, MSS Tumor invasive superficial portion of  the muscle ("internal third "), no tumor perforation, no vascular invasion or perineural invasion, negative resection margins, 0/4 lymph nodes Elevated CEA 02/10/2019 CTs 03/05/2019, compared to outside CTs from 11/22/2018-worsened wall thickening in the cecum stable apple core lesion in the mid transverse colon, no evidence of recurrent tumor at the distal colonic anastomosis, no evidence of metastatic disease Colonoscopy 03/24/2019 20-2 malignant appearing masses noted in the colon, 1 filled the cecum measuring approximately 4 cm across and friable.  The other malignant appearing mass was circumferential creating a stricture with a 1.2 cm lumen located in the transverse segment approximately 4 to 5 cm in length.  There were 12-18 neoplastic appearing polyps in between the 2 obvious malignant masses ranging in size from 3 mm to 2 cm.  5 polyps distal to the transverse colon mass.  2 sigmoid colon polyps.  Cecum mass positive for adenocarcinoma.  Transverse colon mass positive for adenocarcinoma. Foundation 1 transverse colon-MSS, tumor mutation burden 7,KRAS wild-type, BRAF fusion 05/07/2019 laparoscopic extended right hemicolectomy, lysis of adhesions-5 cm invasive moderately differentiated adenocarcinoma involving cecum and proximal ascending colon, carcinoma invades into the pericolonic soft tissue; 7 cm invasive moderately differentiated carcinoma involving the transverse colon, carcinoma invades into the serosal surface (visceral peritoneum); all resection margins negative for carcinoma.  Lymphovascular invasion present.  In the proximal colon metastatic carcinoma involves 3 of 17 lymph nodes with 1 tumor deposit.  In the transverse colon 12 lymph nodes negative for  metastatic carcinoma.  3 separate polyps: Tubular adenoma, inflammatory polyp and hyperplastic polyp Cycle 1 capecitabine 06/02/2019 Cycle 2 capecitabine 06/23/2019 Cycle 3 capecitabine 07/14/2019 Cycle 4 capecitabine 08/04/2019 Cycle 5  capecitabine 08/25/2019 Cycle 6 capecitabine 09/15/2019 Cycle 7 capecitabine 10/06/2019 Cycle 8 capecitabine 10/27/2019 Upper endoscopy 07/23/2020-negative Colonoscopy 07/23/2020-normal-appearing anastomoses, polyp removed from the descending colon-tubular adenoma CTs 01/18/2021-right abdominal mesenteric mass, mass in the low anatomic pelvis consistent with metastases Cycle 1 FOLFOX 02/21/2021 Cycle 2 FOLFOX 03/07/2021, dose reductions made due to mild neutropenia Cycle 3 FOLFOX 03/21/2021 Cycle 4 FOLFOX 04/04/2021 Cycle 5 FOLFOX 04/19/2021 Cycle 6 FOLFOX 05/02/2021 CTs 05/12/2021-decrease size of nodular right mesenteric mass, complete resolution of soft tissue enhancing nodule in the right inguinal canal, resolution of nodular soft tissue mass posterior to the bladder, enlargement of a left upper lobe nodule and new 4 mm left upper lobe nodule-indeterminate Cycle 7 FOLFOX 05/16/2021 2.   Microcytic anemia secondary to #1.    Resolved 3.   Family history of pancreas and colon cancer, negative genetic testing per the [facility] panel, ATM variant of unknown significance 4.   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 appears stable.  He is completed 6 cycles of FOLFOX.  He has tolerated the chemotherapy well.  The CEA has normalized.  The restaging CT is consistent with a response to treatment with a marked improvement in the abdominal masses.  The small lung nodules are indeterminate.  I reviewed the CT images with [name] and his son.  We discussed treatment options.  The plan is to continue FOLFOX for several more cycles.  We will watch for progression of oxaliplatin neuropathy.  We will consider switching to a maintenance regimen, likely Xeloda/Avastin after 9-10 cycles of FOLFOX.    Betsy Coder, MD  05/16/2021  9:52 AM

## 2021-05-16 NOTE — Progress Notes (Signed)
Exam today assisted by interpreter Edwinna Areola w/[employer].

## 2021-05-18 ENCOUNTER — Other Ambulatory Visit: Payer: Self-pay

## 2021-05-18 ENCOUNTER — Inpatient Hospital Stay: Payer: 59

## 2021-05-18 VITALS — BP 146/63 | HR 63 | Temp 98.5°F | Resp 20

## 2021-05-18 DIAGNOSIS — Z5111 Encounter for antineoplastic chemotherapy: Secondary | ICD-10-CM | POA: Diagnosis not present

## 2021-05-18 DIAGNOSIS — C189 Malignant neoplasm of colon, unspecified: Secondary | ICD-10-CM

## 2021-05-18 MED ORDER — HEPARIN SOD (PORK) LOCK FLUSH 100 UNIT/ML IV SOLN
500.0000 [IU] | Freq: Once | INTRAVENOUS | Status: AC | PRN
  Administered 2021-05-18: 12:00:00 500 [IU]

## 2021-05-18 MED ORDER — SODIUM CHLORIDE 0.9% FLUSH
10.0000 mL | INTRAVENOUS | Status: DC | PRN
  Administered 2021-05-18: 12:00:00 10 mL

## 2021-05-28 ENCOUNTER — Other Ambulatory Visit: Payer: Self-pay | Admitting: Oncology

## 2021-05-30 ENCOUNTER — Inpatient Hospital Stay: Payer: 59

## 2021-05-30 ENCOUNTER — Other Ambulatory Visit: Payer: Self-pay

## 2021-05-30 ENCOUNTER — Encounter: Payer: Self-pay | Admitting: Nurse Practitioner

## 2021-05-30 ENCOUNTER — Inpatient Hospital Stay (HOSPITAL_BASED_OUTPATIENT_CLINIC_OR_DEPARTMENT_OTHER): Payer: 59 | Admitting: Nurse Practitioner

## 2021-05-30 ENCOUNTER — Inpatient Hospital Stay: Payer: 59 | Attending: Oncology

## 2021-05-30 ENCOUNTER — Encounter: Payer: Self-pay | Admitting: *Deleted

## 2021-05-30 VITALS — BP 145/65 | HR 66 | Temp 97.8°F | Resp 18 | Ht 68.0 in | Wt 171.4 lb

## 2021-05-30 DIAGNOSIS — C189 Malignant neoplasm of colon, unspecified: Secondary | ICD-10-CM

## 2021-05-30 DIAGNOSIS — D509 Iron deficiency anemia, unspecified: Secondary | ICD-10-CM | POA: Insufficient documentation

## 2021-05-30 DIAGNOSIS — C188 Malignant neoplasm of overlapping sites of colon: Secondary | ICD-10-CM | POA: Insufficient documentation

## 2021-05-30 DIAGNOSIS — G629 Polyneuropathy, unspecified: Secondary | ICD-10-CM | POA: Diagnosis not present

## 2021-05-30 DIAGNOSIS — Z800 Family history of malignant neoplasm of digestive organs: Secondary | ICD-10-CM | POA: Diagnosis not present

## 2021-05-30 DIAGNOSIS — D696 Thrombocytopenia, unspecified: Secondary | ICD-10-CM | POA: Insufficient documentation

## 2021-05-30 DIAGNOSIS — Z5111 Encounter for antineoplastic chemotherapy: Secondary | ICD-10-CM | POA: Insufficient documentation

## 2021-05-30 DIAGNOSIS — C187 Malignant neoplasm of sigmoid colon: Secondary | ICD-10-CM

## 2021-05-30 LAB — CMP (CANCER CENTER ONLY)
ALT: 21 U/L (ref 0–44)
AST: 30 U/L (ref 15–41)
Albumin: 3.5 g/dL (ref 3.5–5.0)
Alkaline Phosphatase: 76 U/L (ref 38–126)
Anion gap: 6 (ref 5–15)
BUN: 16 mg/dL (ref 8–23)
CO2: 25 mmol/L (ref 22–32)
Calcium: 9 mg/dL (ref 8.9–10.3)
Chloride: 107 mmol/L (ref 98–111)
Creatinine: 0.89 mg/dL (ref 0.61–1.24)
GFR, Estimated: >60 mL/min (ref >60)
Glucose, Bld: 109 mg/dL — ABNORMAL HIGH (ref 70–99)
Potassium: 4 mmol/L (ref 3.5–5.1)
Sodium: 138 mmol/L (ref 135–145)
Total Bilirubin: 0.4 mg/dL (ref 0.3–1.2)
Total Protein: 6.5 g/dL (ref 6.5–8.1)

## 2021-05-30 LAB — CBC WITH DIFFERENTIAL (CANCER CENTER ONLY)
Abs Immature Granulocytes: 0.01 10*3/uL (ref 0.00–0.07)
Basophils Absolute: 0 10*3/uL (ref 0.0–0.1)
Basophils Relative: 1 %
Eosinophils Absolute: 0.1 10*3/uL (ref 0.0–0.5)
Eosinophils Relative: 3 %
HCT: 37.3 % — ABNORMAL LOW (ref 39.0–52.0)
Hemoglobin: 11.9 g/dL — ABNORMAL LOW (ref 13.0–17.0)
Immature Granulocytes: 0 %
Lymphocytes Relative: 37 %
Lymphs Abs: 1.3 10*3/uL (ref 0.7–4.0)
MCH: 27.6 pg (ref 26.0–34.0)
MCHC: 31.9 g/dL (ref 30.0–36.0)
MCV: 86.5 fL (ref 80.0–100.0)
Monocytes Absolute: 0.6 10*3/uL (ref 0.1–1.0)
Monocytes Relative: 17 %
Neutro Abs: 1.5 10*3/uL — ABNORMAL LOW (ref 1.7–7.7)
Neutrophils Relative %: 42 %
Platelet Count: 75 10*3/uL — ABNORMAL LOW (ref 150–400)
RBC: 4.31 MIL/uL (ref 4.22–5.81)
RDW: 18.5 % — ABNORMAL HIGH (ref 11.5–15.5)
WBC Count: 3.6 10*3/uL — ABNORMAL LOW (ref 4.0–10.5)
nRBC: 0 % (ref 0.0–0.2)

## 2021-05-30 LAB — CEA (ACCESS): CEA (CHCC): 3.49 ng/mL (ref 0.00–5.00)

## 2021-05-30 MED ORDER — SODIUM CHLORIDE 0.9 % IV SOLN
2000.0000 mg/m2 | INTRAVENOUS | Status: DC
  Administered 2021-05-30: 3850 mg via INTRAVENOUS
  Filled 2021-05-30: qty 77

## 2021-05-30 NOTE — Progress Notes (Signed)
John Vance from [facility] interpreter services at chairside

## 2021-05-30 NOTE — Progress Notes (Signed)
Patient presents for treatment. RN assessment completed along with the following:  Labs/vitals reviewed - Yes, and Not within parameters.  Oxaliplatin  and leucovorin held    Weight within 10% of previous measurement - Yes Informed consent completed and reflects current therapy/intent - Yes, on date 02/21/21             Provider progress note reviewed - Yes, today's provider note was reviewed. Treatment/Antibody/Supportive plan reviewed - Yes, and Fluorouracil pump only S&H and other orders reviewed - Yes, and there are no additional orders identified. Previous treatment date reviewed - Yes, and the appropriate amount of time has elapsed between treatments. Clinic Hand Off Received from - Cristy Friedlander, RN  Patient to proceed with treatment.   Interpreter Kellie Shropshire at chairside until 11:25

## 2021-05-30 NOTE — Progress Notes (Signed)
Patient seen by Ned Card NP today  Vitals are within treatment parameters  Labs reviewed by Ned Card NP and are not all within treatment parameters. OK to treat with 5FU only w/ANC 1.5 and pltc 75,000  Per physician team, patient is ready for treatment. Please note that modifications are being made to the treatment plan including Will only receive 5FU pump this cycle.

## 2021-05-30 NOTE — Patient Instructions (Addendum)
[facility]  The chemotherapy medication bag should finish at 46 hours, 96 hours, or 7 days. For example, if your pump is scheduled for 46 hours and it was put on at 4:00 p.m., it should finish at 2:00 p.m. the day it is scheduled to come off regardless of your appointment time.     Estimated time to finish at 10:00 Wednesday, June 01, 2021.   If the display on your pump reads "Low Volume" and it is beeping, take the batteries out of the pump and come to the cancer center for it to be taken off.   If the pump alarms go off prior to the pump reading "Low Volume" then call [phone] and someone can assist you.  If the plunger comes out and the chemotherapy medication is leaking out, please use your home chemo spill kit to clean up the spill. Do NOT use paper towels or other household products.  If you have problems or questions regarding your pump, please call either [phone] (24 hours a day) or the cancer center Monday-Friday 8:00 a.m.- 4:30 p.m. at the clinic number and we will assist you. If you are unable to get assistance, then go to the nearest Emergency Department and ask the staff to contact the IV team for assistance.   Discharge Instructions: Thank you for choosing [facility] to provide your oncology and hematology care.   If you have a lab appointment with the [facility], please go directly to the [facility] and check in at the registration area.   Wear comfortable clothing and clothing appropriate for easy access to any Portacath or PICC line.   We strive to give you quality time with your provider. You may need to reschedule your appointment if you arrive late (15 or more minutes).  Arriving late affects you and other patients whose appointments are after yours.  Also, if you miss three or more appointments without notifying the office, you may be dismissed from the clinic at the providers discretion.      For  prescription refill requests, have your pharmacy contact our office and allow 72 hours for refills to be completed.    Today you received the following chemotherapy and/or immunotherapy agents Fluorouracil      To help prevent nausea and vomiting after your treatment, we encourage you to take your nausea medication as directed.  BELOW ARE SYMPTOMS THAT SHOULD BE REPORTED IMMEDIATELY: *FEVER GREATER THAN 100.4 F (38 C) OR HIGHER *CHILLS OR SWEATING *NAUSEA AND VOMITING THAT IS NOT CONTROLLED WITH YOUR NAUSEA MEDICATION *UNUSUAL SHORTNESS OF BREATH *UNUSUAL BRUISING OR BLEEDING *URINARY PROBLEMS (pain or burning when urinating, or frequent urination) *BOWEL PROBLEMS (unusual diarrhea, constipation, pain near the anus) TENDERNESS IN MOUTH AND THROAT WITH OR WITHOUT PRESENCE OF ULCERS (sore throat, sores in mouth, or a toothache) UNUSUAL RASH, SWELLING OR PAIN  UNUSUAL VAGINAL DISCHARGE OR ITCHING   Items with * indicate a potential emergency and should be followed up as soon as possible or go to the Emergency Department if any problems should occur.  Please show the CHEMOTHERAPY ALERT CARD or IMMUNOTHERAPY ALERT CARD at check-in to the Emergency Department and triage nurse.  Should you have questions after your visit or need to cancel or reschedule your appointment, please contact [facility]  Dept: [phone]  and follow the prompts.  Office hours are 8:00 a.m. to 4:30 p.m. Monday - Friday. Please note that voicemails left after  4:00 p.m. may not be returned until the following business day.  We are closed weekends and major holidays. You have access to a nurse at all times for urgent questions. Please call the main number to the clinic Dept: [phone] and follow the prompts.   For any non-urgent questions, you may also contact your provider using MyChart. We now offer e-Visits for anyone 50 and older to request care online for non-urgent symptoms. For details  visit [email or web address].   Also download the MyChart app! Go to the app store, search "MyChart", open the app, select [facility], and log in with your MyChart username and password.  Due to Covid, a mask is required upon entering the hospital/clinic. If you do not have a mask, one will be given to you upon arrival. For doctor visits, patients may have 1 support person aged 75 or older with them. For treatment visits, patients cannot have anyone with them due to current Covid guidelines and our immunocompromised population.   Fluorouracil, 5-FU injection What is this medication? FLUOROURACIL, 5-FU (flure oh YOOR a sil) is a chemotherapy drug. It slows the growth of cancer cells. This medicine is used to treat many types of cancer like breast cancer, colon or rectal cancer, pancreatic cancer, and stomach cancer. This medicine may be used for other purposes; ask your health care provider or pharmacist if you have questions. COMMON BRAND NAME(S): Adrucil What should I tell my care team before I take this medication? They need to know if you have any of these conditions: blood disorders dihydropyrimidine dehydrogenase (DPD) deficiency infection (especially a virus infection such as chickenpox, cold sores, or herpes) kidney disease liver disease malnourished, poor nutrition recent or ongoing radiation therapy an unusual or allergic reaction to fluorouracil, other chemotherapy, other medicines, foods, dyes, or preservatives pregnant or trying to get pregnant breast-feeding How should I use this medication? This drug is given as an infusion or injection into a vein. It is administered in a hospital or clinic by a specially trained health care professional. Talk to your pediatrician regarding the use of this medicine in children. Special care may be needed. Overdosage: If you think you have taken too much of this medicine contact a poison control center or emergency room at once. NOTE: This  medicine is only for you. Do not share this medicine with others. What if I miss a dose? It is important not to miss your dose. Call your doctor or health care professional if you are unable to keep an appointment. What may interact with this medication? Do not take this medicine with any of the following medications: live virus vaccines This medicine may also interact with the following medications: medicines that treat or prevent blood clots like warfarin, enoxaparin, and dalteparin This list may not describe all possible interactions. Give your health care provider a list of all the medicines, herbs, non-prescription drugs, or dietary supplements you use. Also tell them if you smoke, drink alcohol, or use illegal drugs. Some items may interact with your medicine. What should I watch for while using this medication? Visit your doctor for checks on your progress. This drug may make you feel generally unwell. This is not uncommon, as chemotherapy can affect healthy cells as well as cancer cells. Report any side effects. Continue your course of treatment even though you feel ill unless your doctor tells you to stop. In some cases, you may be given additional medicines to help with side effects. Follow all directions for their use.  Call your doctor or health care professional for advice if you get a fever, chills or sore throat, or other symptoms of a cold or flu. Do not treat yourself. This drug decreases your body's ability to fight infections. Try to avoid being around people who are sick. This medicine may increase your risk to bruise or bleed. Call your doctor or health care professional if you notice any unusual bleeding. Be careful brushing and flossing your teeth or using a toothpick because you may get an infection or bleed more easily. If you have any dental work done, tell your dentist you are receiving this medicine. Avoid taking products that contain aspirin, acetaminophen, ibuprofen,  naproxen, or ketoprofen unless instructed by your doctor. These medicines may hide a fever. Do not become pregnant while taking this medicine. Women should inform their doctor if they wish to become pregnant or think they might be pregnant. There is a potential for serious side effects to an unborn child. Talk to your health care professional or pharmacist for more information. Do not breast-feed an infant while taking this medicine. Men should inform their doctor if they wish to father a child. This medicine may lower sperm counts. Do not treat diarrhea with over the counter products. Contact your doctor if you have diarrhea that lasts more than 2 days or if it is severe and watery. This medicine can make you more sensitive to the sun. Keep out of the sun. If you cannot avoid being in the sun, wear protective clothing and use sunscreen. Do not use sun lamps or tanning beds/booths. What side effects may I notice from receiving this medication? Side effects that you should report to your doctor or health care professional as soon as possible: allergic reactions like skin rash, itching or hives, swelling of the face, lips, or tongue low blood counts - this medicine may decrease the number of white blood cells, red blood cells and platelets. You may be at increased risk for infections and bleeding. signs of infection - fever or chills, cough, sore throat, pain or difficulty passing urine signs of decreased platelets or bleeding - bruising, pinpoint red spots on the skin, black, tarry stools, blood in the urine signs of decreased red blood cells - unusually weak or tired, fainting spells, lightheadedness breathing problems changes in vision chest pain mouth sores nausea and vomiting pain, swelling, redness at site where injected pain, tingling, numbness in the hands or feet redness, swelling, or sores on hands or feet stomach pain unusual bleeding Side effects that usually do not require medical  attention (report to your doctor or health care professional if they continue or are bothersome): changes in finger or toe nails diarrhea dry or itchy skin hair loss headache loss of appetite sensitivity of eyes to the light stomach upset unusually teary eyes This list may not describe all possible side effects. Call your doctor for medical advice about side effects. You may report side effects to FDA at 1-800-FDA-1088. Where should I keep my medication? This drug is given in a hospital or clinic and will not be stored at home. NOTE: This sheet is a summary. It may not cover all possible information. If you have questions about this medicine, talk to your doctor, pharmacist, or health care provider.  2022 Elsevier/Gold Standard (2020-12-28 00:00:00)

## 2021-05-30 NOTE — Progress Notes (Signed)
[facility] OFFICE PROGRESS NOTE   Diagnosis: Colon cancer  INTERVAL HISTORY:   [name] returns as scheduled.  He completed cycle 7 FOLFOX 05/16/2021.  He denies nausea/vomiting.  No mouth sores.  He had diarrhea over the course of 1 night last week.  The diarrhea resolved with Imodium.  Several other family members had similar symptoms.  He notes an abnormal sensation in the fingertips.  This does not interfere with activity.  He denies bleeding.  Objective:  Vital signs in last 24 hours:  Blood pressure (!) 145/65, pulse 66, temperature 97.8 F (36.6 C), temperature source Oral, resp. rate 18, height 5' 8"  (1.727 m), weight 171 lb 6.4 oz (77.7 kg), SpO2 100 %.    HEENT: No thrush or ulcers. Resp: Lungs clear bilaterally. Cardio: Regular rate and rhythm. GI: Abdomen soft and nontender.  No hepatomegaly. Vascular: No leg edema. Neuro: Vibratory sense moderately decreased over the fingertips for tuning fork exam. Skin: Palms without erythema. Port-A-Cath without erythema.   Lab Results:  Lab Results  Component Value Date   WBC 3.6 (L) 05/30/2021   HGB 11.9 (L) 05/30/2021   HCT 37.3 (L) 05/30/2021   MCV 86.5 05/30/2021   PLT 75 (L) 05/30/2021   NEUTROABS 1.5 (L) 05/30/2021    Imaging:  No results found.  Medications: I have reviewed the patient's current medications.  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), transverse colon cancer stage IIB (T4aN0) status post extended right hemicolectomy 05/07/2019, MSS Tumor invasive superficial portion of the muscle ("internal third "), no tumor perforation, no vascular invasion or perineural invasion, negative resection margins, 0/4 lymph nodes Elevated CEA 02/10/2019 CTs 03/05/2019, compared to outside CTs from 11/22/2018-worsened wall thickening in the cecum stable apple core lesion in the mid  transverse colon, no evidence of recurrent tumor at the distal colonic anastomosis, no evidence of metastatic disease Colonoscopy 03/24/2019 20-2 malignant appearing masses noted in the colon, 1 filled the cecum measuring approximately 4 cm across and friable.  The other malignant appearing mass was circumferential creating a stricture with a 1.2 cm lumen located in the transverse segment approximately 4 to 5 cm in length.  There were 12-18 neoplastic appearing polyps in between the 2 obvious malignant masses ranging in size from 3 mm to 2 cm.  5 polyps distal to the transverse colon mass.  2 sigmoid colon polyps.  Cecum mass positive for adenocarcinoma.  Transverse colon mass positive for adenocarcinoma. Foundation 1 transverse colon-MSS, tumor mutation burden 7,KRAS wild-type, BRAF fusion 05/07/2019 laparoscopic extended right hemicolectomy, lysis of adhesions-5 cm invasive moderately differentiated adenocarcinoma involving cecum and proximal ascending colon, carcinoma invades into the pericolonic soft tissue; 7 cm invasive moderately differentiated carcinoma involving the transverse colon, carcinoma invades into the serosal surface (visceral peritoneum); all resection margins negative for carcinoma.  Lymphovascular invasion present.  In the proximal colon metastatic carcinoma involves 3 of 17 lymph nodes with 1 tumor deposit.  In the transverse colon 12 lymph nodes negative for metastatic carcinoma.  3 separate polyps: Tubular adenoma, inflammatory polyp and hyperplastic polyp Cycle 1 capecitabine 06/02/2019 Cycle 2 capecitabine 06/23/2019 Cycle 3 capecitabine 07/14/2019 Cycle 4 capecitabine 08/04/2019 Cycle 5 capecitabine 08/25/2019 Cycle 6 capecitabine 09/15/2019 Cycle 7 capecitabine 10/06/2019 Cycle 8 capecitabine 10/27/2019 Upper endoscopy 07/23/2020-negative Colonoscopy 07/23/2020-normal-appearing anastomoses, polyp removed from the descending colon-tubular adenoma CTs 01/18/2021-right abdominal mesenteric mass,  mass in the low anatomic pelvis consistent with  metastases Cycle 1 FOLFOX 02/21/2021 Cycle 2 FOLFOX 03/07/2021, dose reductions made due to mild neutropenia Cycle 3 FOLFOX 03/21/2021 Cycle 4 FOLFOX 04/04/2021 Cycle 5 FOLFOX 04/19/2021 Cycle 6 FOLFOX 05/02/2021 CTs 05/12/2021-decrease size of nodular right mesenteric mass, complete resolution of soft tissue enhancing nodule in the right inguinal canal, resolution of nodular soft tissue mass posterior to the bladder, enlargement of a left upper lobe nodule and new 4 mm left upper lobe nodule-indeterminate Cycle 7 FOLFOX 05/16/2021 Cycle 8 FOLFOX 05/30/2021, oxaliplatin and 5-FU bolus held due to neuropathy and thrombocytopenia 2.   Microcytic anemia secondary to #1.    Resolved 3.   Family history of pancreas and colon cancer, negative genetic testing per the [facility] panel, ATM variant of unknown significance 4.   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 appears well.  He is accompanied by his son and a Spanish interpreter.  He has completed 7 cycles of FOLFOX.  Plan to proceed with cycle 8 today as scheduled.  Oxaliplatin and 5-FU bolus will be held due to neuropathy and thrombocytopenia.  Plan for 5-FU pump alone.  He agrees with this plan.  He understands to contact the office with bleeding.  CBC and chemistry panel reviewed.  Labs adequate to proceed with treatment.  Changes to regimen as above due to thrombocytopenia and neuropathy.  He will return for lab, follow-up, treatment in 2 weeks.  He will contact the office in the interim as outlined above with any other problems.    Ned Card ANP/GNP-BC   05/30/2021  10:23 AM

## 2021-06-01 ENCOUNTER — Other Ambulatory Visit: Payer: Self-pay

## 2021-06-01 ENCOUNTER — Inpatient Hospital Stay: Payer: 59

## 2021-06-01 VITALS — BP 141/73 | HR 71 | Temp 98.6°F | Resp 20

## 2021-06-01 DIAGNOSIS — Z5111 Encounter for antineoplastic chemotherapy: Secondary | ICD-10-CM | POA: Diagnosis not present

## 2021-06-01 DIAGNOSIS — C189 Malignant neoplasm of colon, unspecified: Secondary | ICD-10-CM

## 2021-06-01 MED ORDER — HEPARIN SOD (PORK) LOCK FLUSH 100 UNIT/ML IV SOLN
500.0000 [IU] | Freq: Once | INTRAVENOUS | Status: AC | PRN
  Administered 2021-06-01: 500 [IU]

## 2021-06-01 MED ORDER — SODIUM CHLORIDE 0.9% FLUSH
10.0000 mL | INTRAVENOUS | Status: DC | PRN
  Administered 2021-06-01: 10 mL

## 2021-06-01 NOTE — Patient Instructions (Signed)
Heparin injection What is this medication? HEPARIN (HEP a rin) is an anticoagulant. It is used to treat or prevent clots in the veins, arteries, lungs, or heart. It stops clots from forming or getting bigger. This medicine prevents clotting during open-heart surgery, dialysis, or in patients who are confined to bed. This medicine may be used for other purposes; ask your health care provider or pharmacist if you have questions. COMMON BRAND NAME(S): Hep-Lock, Hep-Lock U/P, Hepflush-10, Monoject Prefill Advanced Heparin Lock Flush, SASH Normal Saline and Heparin What should I tell my care team before I take this medication? They need to know if you have any of these conditions: bleeding disorders, such as hemophilia or low blood platelets bowel disease or diverticulitis endocarditis high blood pressure liver disease recent surgery or delivery of a baby stomach ulcers an unusual or allergic reaction to heparin, benzyl alcohol, sulfites, other medicines, foods, dyes, or preservatives pregnant or trying to get pregnant breast-feeding How should I use this medication? This medicine is given by injection or infusion into a vein. It can also be given by injection of small amounts under the skin. It is usually given by a health care professional in a hospital or clinic setting. If you get this medicine at home, you will be taught how to prepare and give this medicine. Use exactly as directed. Take your medicine at regular intervals. Do not take it more often than directed. Do not stop taking except on your doctor's advice. Stopping this medicine may increase your risk of a blot clot. Be sure to refill your prescription before you run out of medicine. It is important that you put your used needles and syringes in a special sharps container. Do not put them in a trash can. If you do not have a sharps container, call your pharmacist or healthcare provider to get one. Talk to your pediatrician regarding the  use of this medicine in children. While this medicine may be prescribed for children for selected conditions, precautions do apply. Overdosage: If you think you have taken too much of this medicine contact a poison control center or emergency room at once. NOTE: This medicine is only for you. Do not share this medicine with others. What if I miss a dose? If you miss a dose, take it as soon as you can. If it is almost time for your next dose, take only that dose. Do not take double or extra doses. What may interact with this medication? Do not take this medicine with any of the following medications: aspirin and aspirin-like drugs mifepristone medicines that treat or prevent blood clots like warfarin, enoxaparin, and dalteparin palifermin protamine This medicine may also interact with the following medications: dextran digoxin hydroxychloroquine medicines for treating colds or allergies nicotine NSAIDs, medicines for pain and inflammation, like ibuprofen or naproxen phenylbutazone tetracycline antibiotics This list may not describe all possible interactions. Give your health care provider a list of all the medicines, herbs, non-prescription drugs, or dietary supplements you use. Also tell them if you smoke, drink alcohol, or use illegal drugs. Some items may interact with your medicine. What should I watch for while using this medication? Visit your healthcare professional for regular checks on your progress. You may need blood work done while you are taking this medicine. Your condition will be monitored carefully while you are receiving this medicine. It is important not to miss any appointments. Wear a medical ID bracelet or chain, and carry a card that describes your disease and details  of your medicine and dosage times. Notify your doctor or healthcare professional at once if you have cold, blue hands or feet. If you are going to need surgery or other procedure, tell your healthcare  professional that you are using this medicine. Avoid sports and activities that might cause injury while you are using this medicine. Severe falls or injuries can cause unseen bleeding. Be careful when using sharp tools or knives. Consider using an Copy. Take special care brushing or flossing your teeth. Report any injuries, bruising, or red spots on the skin to your healthcare professional. Using this medicine for a long time may weaken your bones and increase the risk of bone fractures. You should make sure that you get enough calcium and vitamin D while you are taking this medicine. Discuss the foods you eat and the vitamins you take with your healthcare professional. Wear a medical ID bracelet or chain. Carry a card that describes your disease and details of your medicine and dosage times. What side effects may I notice from receiving this medication? Side effects that you should report to your doctor or health care professional as soon as possible: allergic reactions like skin rash, itching or hives, swelling of the face, lips, or tongue bone pain fever, chills nausea, vomiting signs and symptoms of bleeding such as bloody or black, tarry stools; red or dark-brown urine; spitting up blood or brown material that looks like coffee grounds; red spots on the skin; unusual bruising or bleeding from the eye, gums, or nose signs and symptoms of a blood clot such as chest pain; shortness of breath; pain, swelling, or warmth in the leg signs and symptoms of a stroke such as changes in vision; confusion; trouble speaking or understanding; severe headaches; sudden numbness or weakness of the face, arm or leg; trouble walking; dizziness; loss of coordination Side effects that usually do not require medical attention (report to your doctor or health care professional if they continue or are bothersome): hair loss pain, redness, or irritation at site where injected This list may not describe all  possible side effects. Call your doctor for medical advice about side effects. You may report side effects to FDA at 1-800-FDA-1088. Where should I keep my medication? Keep out of the reach of children. Store unopened vials at room temperature between 15 and 30 degrees C (59 and 86 degrees F). Do not freeze. Do not use if solution is discolored or particulate matter is present. Throw away any unused medicine after the expiration date. NOTE: This sheet is a summary. It may not cover all possible information. If you have questions about this medicine, talk to your doctor, pharmacist, or health care provider.  2022 [employer] Standard (2020-05-20 00:00:00)

## 2021-06-12 ENCOUNTER — Other Ambulatory Visit: Payer: Self-pay | Admitting: Oncology

## 2021-06-13 ENCOUNTER — Inpatient Hospital Stay (HOSPITAL_BASED_OUTPATIENT_CLINIC_OR_DEPARTMENT_OTHER): Payer: 59 | Admitting: Nurse Practitioner

## 2021-06-13 ENCOUNTER — Encounter: Payer: Self-pay | Admitting: Nurse Practitioner

## 2021-06-13 ENCOUNTER — Inpatient Hospital Stay: Payer: 59

## 2021-06-13 ENCOUNTER — Other Ambulatory Visit: Payer: Self-pay

## 2021-06-13 VITALS — BP 148/73 | HR 78 | Temp 97.8°F | Resp 18 | Ht 68.0 in | Wt 174.8 lb

## 2021-06-13 DIAGNOSIS — C189 Malignant neoplasm of colon, unspecified: Secondary | ICD-10-CM

## 2021-06-13 DIAGNOSIS — Z5111 Encounter for antineoplastic chemotherapy: Secondary | ICD-10-CM | POA: Diagnosis not present

## 2021-06-13 LAB — CBC WITH DIFFERENTIAL (CANCER CENTER ONLY)
Abs Immature Granulocytes: 0.02 10*3/uL (ref 0.00–0.07)
Basophils Absolute: 0 10*3/uL (ref 0.0–0.1)
Basophils Relative: 1 %
Eosinophils Absolute: 0.1 10*3/uL (ref 0.0–0.5)
Eosinophils Relative: 2 %
HCT: 39.4 % (ref 39.0–52.0)
Hemoglobin: 12.7 g/dL — ABNORMAL LOW (ref 13.0–17.0)
Immature Granulocytes: 0 %
Lymphocytes Relative: 27 %
Lymphs Abs: 1.4 10*3/uL (ref 0.7–4.0)
MCH: 28.3 pg (ref 26.0–34.0)
MCHC: 32.2 g/dL (ref 30.0–36.0)
MCV: 87.9 fL (ref 80.0–100.0)
Monocytes Absolute: 0.9 10*3/uL (ref 0.1–1.0)
Monocytes Relative: 18 %
Neutro Abs: 2.6 10*3/uL (ref 1.7–7.7)
Neutrophils Relative %: 52 %
Platelet Count: 117 10*3/uL — ABNORMAL LOW (ref 150–400)
RBC: 4.48 MIL/uL (ref 4.22–5.81)
RDW: 17.7 % — ABNORMAL HIGH (ref 11.5–15.5)
WBC Count: 5 10*3/uL (ref 4.0–10.5)
nRBC: 0 % (ref 0.0–0.2)

## 2021-06-13 LAB — CMP (CANCER CENTER ONLY)
ALT: 15 U/L (ref 0–44)
AST: 22 U/L (ref 15–41)
Albumin: 3.8 g/dL (ref 3.5–5.0)
Alkaline Phosphatase: 80 U/L (ref 38–126)
Anion gap: 8 (ref 5–15)
BUN: 18 mg/dL (ref 8–23)
CO2: 25 mmol/L (ref 22–32)
Calcium: 9.4 mg/dL (ref 8.9–10.3)
Chloride: 106 mmol/L (ref 98–111)
Creatinine: 0.89 mg/dL (ref 0.61–1.24)
GFR, Estimated: >60 mL/min (ref >60)
Glucose, Bld: 104 mg/dL — ABNORMAL HIGH (ref 70–99)
Potassium: 4.2 mmol/L (ref 3.5–5.1)
Sodium: 139 mmol/L (ref 135–145)
Total Bilirubin: 0.4 mg/dL (ref 0.3–1.2)
Total Protein: 6.6 g/dL (ref 6.5–8.1)

## 2021-06-13 LAB — CEA (ACCESS): CEA (CHCC): 2.72 ng/mL (ref 0.00–5.00)

## 2021-06-13 MED ORDER — OXALIPLATIN CHEMO INJECTION 100 MG/20ML
50.0000 mg/m2 | Freq: Once | INTRAVENOUS | Status: AC
  Administered 2021-06-13: 95 mg via INTRAVENOUS
  Filled 2021-06-13: qty 19

## 2021-06-13 MED ORDER — DEXTROSE 5 % IV SOLN: Freq: Once | INTRAVENOUS | Status: AC

## 2021-06-13 MED ORDER — FLUOROURACIL CHEMO INJECTION 2.5 GM/50ML
300.0000 mg/m2 | Freq: Once | INTRAVENOUS | Status: AC
  Administered 2021-06-13: 600 mg via INTRAVENOUS
  Filled 2021-06-13: qty 12

## 2021-06-13 MED ORDER — SODIUM CHLORIDE 0.9 % IV SOLN
10.0000 mg | Freq: Once | INTRAVENOUS | Status: AC
  Administered 2021-06-13: 10 mg via INTRAVENOUS
  Filled 2021-06-13: qty 1

## 2021-06-13 MED ORDER — LEUCOVORIN CALCIUM INJECTION 350 MG
300.0000 mg/m2 | Freq: Once | INTRAVENOUS | Status: AC
  Administered 2021-06-13: 580 mg via INTRAVENOUS
  Filled 2021-06-13: qty 29

## 2021-06-13 MED ORDER — PALONOSETRON HCL INJECTION 0.25 MG/5ML
0.2500 mg | Freq: Once | INTRAVENOUS | Status: AC
  Administered 2021-06-13: 0.25 mg via INTRAVENOUS
  Filled 2021-06-13: qty 5

## 2021-06-13 MED ORDER — SODIUM CHLORIDE 0.9 % IV SOLN
2000.0000 mg/m2 | INTRAVENOUS | Status: DC
  Administered 2021-06-13: 3850 mg via INTRAVENOUS
  Filled 2021-06-13: qty 77

## 2021-06-13 NOTE — Progress Notes (Signed)
[facility] OFFICE PROGRESS NOTE   Diagnosis: Colon cancer  INTERVAL HISTORY:   [name] returns as scheduled.  He completed a cycle of 5-fluorouracil 05/30/2021.  Oxaliplatin was held due to neuropathy and thrombocytopenia.  He notes numbness/tingling in the fingertips with cold exposure.  Symptoms do not interfere with activity.  No nausea/vomiting.  No mouth sores.  No diarrhea.  He continues to have a good appetite.  Objective:  Vital signs in last 24 hours:  Blood pressure (!) 148/73, pulse 78, temperature 97.8 F (36.6 C), temperature source Oral, resp. rate 18, height _0  (1.727 m), weight 174 lb 12.8 oz (79.3 kg), SpO2 99 %.    HEENT: No thrush or ulcers. Resp: Lungs clear bilaterally. Cardio: Regular rate and rhythm. GI: Abdomen soft and nontender.  No hepatosplenomegaly. Vascular: No leg edema. Neuro: Vibratory sense mildly to moderately decreased over the fingertips per tuning fork exam. Skin: Palms without erythema. Port-A-Cath without erythema.   Lab Results:  Lab Results  Component Value Date   WBC 5.0 06/13/2021   HGB 12.7 (L) 06/13/2021   HCT 39.4 06/13/2021   MCV 87.9 06/13/2021   PLT 117 (L) 06/13/2021   NEUTROABS 2.6 06/13/2021    Imaging:  No results found.  Medications: I have reviewed the patient's current medications.  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), transverse colon cancer stage IIB (T4aN0) status post extended right hemicolectomy 05/07/2019, MSS Tumor invasive superficial portion of the muscle ("internal third "), no tumor perforation, no vascular invasion or perineural invasion, negative resection margins, 0/4 lymph nodes Elevated CEA 02/10/2019 CTs 03/05/2019, compared to outside CTs from 11/22/2018-worsened wall thickening in the cecum stable apple core lesion in the mid transverse colon, no evidence  of recurrent tumor at the distal colonic anastomosis, no evidence of metastatic disease Colonoscopy 03/24/2019 20-2 malignant appearing masses noted in the colon, 1 filled the cecum measuring approximately 4 cm across and friable.  The other malignant appearing mass was circumferential creating a stricture with a 1.2 cm lumen located in the transverse segment approximately 4 to 5 cm in length.  There were 12-18 neoplastic appearing polyps in between the 2 obvious malignant masses ranging in size from 3 mm to 2 cm.  5 polyps distal to the transverse colon mass.  2 sigmoid colon polyps.  Cecum mass positive for adenocarcinoma.  Transverse colon mass positive for adenocarcinoma. Foundation 1 transverse colon-MSS, tumor mutation burden 7,KRAS wild-type, BRAF fusion 05/07/2019 laparoscopic extended right hemicolectomy, lysis of adhesions-5 cm invasive moderately differentiated adenocarcinoma involving cecum and proximal ascending colon, carcinoma invades into the pericolonic soft tissue; 7 cm invasive moderately differentiated carcinoma involving the transverse colon, carcinoma invades into the serosal surface (visceral peritoneum); all resection margins negative for carcinoma.  Lymphovascular invasion present.  In the proximal colon metastatic carcinoma involves 3 of 17 lymph nodes with 1 tumor deposit.  In the transverse colon 12 lymph nodes negative for metastatic carcinoma.  3 separate polyps: Tubular adenoma, inflammatory polyp and hyperplastic polyp Cycle 1 capecitabine 06/02/2019 Cycle 2 capecitabine 06/23/2019 Cycle 3 capecitabine 07/14/2019 Cycle 4 capecitabine 08/04/2019 Cycle 5 capecitabine 08/25/2019 Cycle 6 capecitabine 09/15/2019 Cycle 7 capecitabine 10/06/2019 Cycle 8 capecitabine 10/27/2019 Upper endoscopy 07/23/2020-negative Colonoscopy 07/23/2020-normal-appearing anastomoses, polyp removed from the descending colon-tubular adenoma CTs 01/18/2021-right abdominal mesenteric mass, mass in the low anatomic  pelvis consistent with metastases Cycle 1 FOLFOX 02/21/2021 Cycle 2 FOLFOX 03/07/2021, dose  reductions made due to mild neutropenia Cycle 3 FOLFOX 03/21/2021 Cycle 4 FOLFOX 04/04/2021 Cycle 5 FOLFOX 04/19/2021 Cycle 6 FOLFOX 05/02/2021 CTs 05/12/2021-decrease size of nodular right mesenteric mass, complete resolution of soft tissue enhancing nodule in the right inguinal canal, resolution of nodular soft tissue mass posterior to the bladder, enlargement of a left upper lobe nodule and new 4 mm left upper lobe nodule-indeterminate Cycle 7 FOLFOX 05/16/2021 Cycle 8 FOLFOX 05/30/2021, oxaliplatin and 5-FU bolus held due to neuropathy and thrombocytopenia Cycle 9 FOLFOX 06/13/2021, oxaliplatin dose reduced 2.   Microcytic anemia secondary to #1.    Resolved 3.   Family history of pancreas and colon cancer, negative genetic testing per the [employer] panel, ATM variant of unknown significance 4.   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 appears stable.  He has completed 8 cycles of FOLFOX.  Oxaliplatin and 5-FU bolus were held 2 weeks ago due to thrombocytopenia and neuropathy.  Review of the CBC from today shows counts adequate to proceed with treatment, platelet count is better.  Oxaliplatin will be dose reduced beginning with today's treatment.  He will return for lab, follow-up, FOLFOX in 2 weeks.  He will contact the office in the interim with any problems.    Ned Card ANP/GNP-BC   06/13/2021  9:52 AM

## 2021-06-13 NOTE — Progress Notes (Signed)
Patient seen by Ned Card NP today  Vitals are within treatment parameters.  Labs reviewed by Ned Card NP and are within treatment parameters. Platelet count 117,000.  Per physician team, patient is ready for treatment. Please note that modifications are being made to the treatment plan including a dose reduction of the Oxaliplatin. Please do not release the Oxaliplatin until the dosage has been decreased.

## 2021-06-13 NOTE — Patient Instructions (Signed)
[facility]   The chemotherapy medication bag should finish at 46 hours, 96 hours, or 7 days. For example, if your pump is scheduled for 46 hours and it was put on at 4:00 p.m., it should finish at 2:00 p.m. the day it is scheduled to come off regardless of your appointment time.     Estimated time to finish at 12 pm  Wednesday, 06/15/2021.   If the display on your pump reads "Low Volume" and it is beeping, take the batteries out of the pump and come to the cancer center for it to be taken off.   If the pump alarms go off prior to the pump reading "Low Volume" then call [phone] and someone can assist you.  If the plunger comes out and the chemotherapy medication is leaking out, please use your home chemo spill kit to clean up the spill. Do NOT use paper towels or other household products.  If you have problems or questions regarding your pump, please call either [phone] (24 hours a day) or the cancer center Monday-Friday 8:00 a.m.- 4:30 p.m. at the clinic number and we will assist you. If you are unable to get assistance, then go to the nearest Emergency Department and ask the staff to contact the IV team for assistance.  Discharge Instructions: Thank you for choosing [facility] to provide your oncology and hematology care.   If you have a lab appointment with the [facility], please go directly to the [facility] and check in at the registration area.   Wear comfortable clothing and clothing appropriate for easy access to any Portacath or PICC line.   We strive to give you quality time with your provider. You may need to reschedule your appointment if you arrive late (15 or more minutes).  Arriving late affects you and other patients whose appointments are after yours.  Also, if you miss three or more appointments without notifying the office, you may be dismissed from the clinic at the providers discretion.      For prescription  refill requests, have your pharmacy contact our office and allow 72 hours for refills to be completed.    Today you received the following chemotherapy and/or immunotherapy agents Oxaliplatin, leucovorin, fluorouracil   To help prevent nausea and vomiting after your treatment, we encourage you to take your nausea medication as directed.  BELOW ARE SYMPTOMS THAT SHOULD BE REPORTED IMMEDIATELY: *FEVER GREATER THAN 100.4 F (38 C) OR HIGHER *CHILLS OR SWEATING *NAUSEA AND VOMITING THAT IS NOT CONTROLLED WITH YOUR NAUSEA MEDICATION *UNUSUAL SHORTNESS OF BREATH *UNUSUAL BRUISING OR BLEEDING *URINARY PROBLEMS (pain or burning when urinating, or frequent urination) *BOWEL PROBLEMS (unusual diarrhea, constipation, pain near the anus) TENDERNESS IN MOUTH AND THROAT WITH OR WITHOUT PRESENCE OF ULCERS (sore throat, sores in mouth, or a toothache) UNUSUAL RASH, SWELLING OR PAIN  UNUSUAL VAGINAL DISCHARGE OR ITCHING   Items with * indicate a potential emergency and should be followed up as soon as possible or go to the Emergency Department if any problems should occur.  Please show the CHEMOTHERAPY ALERT CARD or IMMUNOTHERAPY ALERT CARD at check-in to the Emergency Department and triage nurse.  Should you have questions after your visit or need to cancel or reschedule your appointment, please contact [facility]  Dept: [phone]  and follow the prompts.  Office hours are 8:00 a.m. to 4:30 p.m. Monday - Friday. Please note that voicemails left after 4:00  p.m. may not be returned until the following business day.  We are closed weekends and major holidays. You have access to a nurse at all times for urgent questions. Please call the main number to the clinic Dept: [phone] and follow the prompts.   For any non-urgent questions, you may also contact your provider using MyChart. We now offer e-Visits for anyone 81 and older to request care online for non-urgent symptoms.  For details visit [email or web address].   Also download the MyChart app! Go to the app store, search "MyChart", open the app, select [facility], and log in with your MyChart username and password.  Due to Covid, a mask is required upon entering the hospital/clinic. If you do not have a mask, one will be given to you upon arrival. For doctor visits, patients may have 1 support person aged 72 or older with them. For treatment visits, patients cannot have anyone with them due to current Covid guidelines and our immunocompromised population.   Oxaliplatin Injection What is this medication? OXALIPLATIN (ox AL i PLA tin) is a chemotherapy drug. It targets fast dividing cells, like cancer cells, and causes these cells to die. This medicine is used to treat cancers of the colon and rectum, and many other cancers. This medicine may be used for other purposes; ask your health care provider or pharmacist if you have questions. COMMON BRAND NAME(S): Eloxatin What should I tell my care team before I take this medication? They need to know if you have any of these conditions: heart disease history of irregular heartbeat liver disease low blood counts, like white cells, platelets, or red blood cells lung or breathing disease, like asthma take medicines that treat or prevent blood clots tingling of the fingers or toes, or other nerve disorder an unusual or allergic reaction to oxaliplatin, other chemotherapy, other medicines, foods, dyes, or preservatives pregnant or trying to get pregnant breast-feeding How should I use this medication? This drug is given as an infusion into a vein. It is administered in a hospital or clinic by a specially trained health care professional. Talk to your pediatrician regarding the use of this medicine in children. Special care may be needed. Overdosage: If you think you have taken too much of this medicine contact a poison control center or emergency room at once. NOTE:  This medicine is only for you. Do not share this medicine with others. What if I miss a dose? It is important not to miss a dose. Call your doctor or health care professional if you are unable to keep an appointment. What may interact with this medication? Do not take this medicine with any of the following medications: cisapride dronedarone pimozide thioridazine This medicine may also interact with the following medications: aspirin and aspirin-like medicines certain medicines that treat or prevent blood clots like warfarin, apixaban, dabigatran, and rivaroxaban cisplatin cyclosporine diuretics medicines for infection like acyclovir, adefovir, amphotericin B, bacitracin, cidofovir, foscarnet, ganciclovir, gentamicin, pentamidine, vancomycin NSAIDs, medicines for pain and inflammation, like ibuprofen or naproxen other medicines that prolong the QT interval (an abnormal heart rhythm) pamidronate zoledronic acid This list may not describe all possible interactions. Give your health care provider a list of all the medicines, herbs, non-prescription drugs, or dietary supplements you use. Also tell them if you smoke, drink alcohol, or use illegal drugs. Some items may interact with your medicine. What should I watch for while using this medication? Your condition will be monitored carefully while you are receiving this medicine. You may  need blood work done while you are taking this medicine. This medicine may make you feel generally unwell. This is not uncommon as chemotherapy can affect healthy cells as well as cancer cells. Report any side effects. Continue your course of treatment even though you feel ill unless your healthcare professional tells you to stop. This medicine can make you more sensitive to cold. Do not drink cold drinks or use ice. Cover exposed skin before coming in contact with cold temperatures or cold objects. When out in cold weather wear warm clothing and cover your mouth  and nose to warm the air that goes into your lungs. Tell your doctor if you get sensitive to the cold. Do not become pregnant while taking this medicine or for 9 months after stopping it. Women should inform their health care professional if they wish to become pregnant or think they might be pregnant. Men should not father a child while taking this medicine and for 6 months after stopping it. There is potential for serious side effects to an unborn child. Talk to your health care professional for more information. Do not breast-feed a child while taking this medicine or for 3 months after stopping it. This medicine has caused ovarian failure in some women. This medicine may make it more difficult to get pregnant. Talk to your health care professional if you are concerned about your fertility. This medicine has caused decreased sperm counts in some men. This may make it more difficult to father a child. Talk to your health care professional if you are concerned about your fertility. This medicine may increase your risk of getting an infection. Call your health care professional for advice if you get a fever, chills, or sore throat, or other symptoms of a cold or flu. Do not treat yourself. Try to avoid being around people who are sick. Avoid taking medicines that contain aspirin, acetaminophen, ibuprofen, naproxen, or ketoprofen unless instructed by your health care professional. These medicines may hide a fever. Be careful brushing or flossing your teeth or using a toothpick because you may get an infection or bleed more easily. If you have any dental work done, tell your dentist you are receiving this medicine. What side effects may I notice from receiving this medication? Side effects that you should report to your doctor or health care professional as soon as possible: allergic reactions like skin rash, itching or hives, swelling of the face, lips, or tongue breathing problems cough low blood counts  - this medicine may decrease the number of white blood cells, red blood cells, and platelets. You may be at increased risk for infections and bleeding nausea, vomiting pain, redness, or irritation at site where injected pain, tingling, numbness in the hands or feet signs and symptoms of bleeding such as bloody or black, tarry stools; red or dark brown urine; spitting up blood or brown material that looks like coffee grounds; red spots on the skin; unusual bruising or bleeding from the eyes, gums, or nose signs and symptoms of a dangerous change in heartbeat or heart rhythm like chest pain; dizziness; fast, irregular heartbeat; palpitations; feeling faint or lightheaded; falls signs and symptoms of infection like fever; chills; cough; sore throat; pain or trouble passing urine signs and symptoms of liver injury like dark yellow or brown urine; general ill feeling or flu-like symptoms; light-colored stools; loss of appetite; nausea; right upper belly pain; unusually weak or tired; yellowing of the eyes or skin signs and symptoms of low red blood  cells or anemia such as unusually weak or tired; feeling faint or lightheaded; falls signs and symptoms of muscle injury like dark urine; trouble passing urine or change in the amount of urine; unusually weak or tired; muscle pain; back pain Side effects that usually do not require medical attention (report to your doctor or health care professional if they continue or are bothersome): changes in taste diarrhea gas hair loss loss of appetite mouth sores This list may not describe all possible side effects. Call your doctor for medical advice about side effects. You may report side effects to FDA at 1-800-FDA-1088. Where should I keep my medication? This drug is given in a hospital or clinic and will not be stored at home. NOTE: This sheet is a summary. It may not cover all possible information. If you have questions about this medicine, talk to your doctor,  pharmacist, or health care provider.  2022 Elsevier/Gold Standard (2020-12-28 00:00:00)  Leucovorin injection What is this medication? LEUCOVORIN (loo koe VOR in) is used to prevent or treat the harmful effects of some medicines. This medicine is used to treat anemia caused by a low amount of folic acid in the body. It is also used with 5-fluorouracil (5-FU) to treat colon cancer. This medicine may be used for other purposes; ask your health care provider or pharmacist if you have questions. What should I tell my care team before I take this medication? They need to know if you have any of these conditions: anemia from low levels of vitamin B-12 in the blood an unusual or allergic reaction to leucovorin, folic acid, other medicines, foods, dyes, or preservatives pregnant or trying to get pregnant breast-feeding How should I use this medication? This medicine is for injection into a muscle or into a vein. It is given by a health care professional in a hospital or clinic setting. Talk to your pediatrician regarding the use of this medicine in children. Special care may be needed. Overdosage: If you think you have taken too much of this medicine contact a poison control center or emergency room at once. NOTE: This medicine is only for you. Do not share this medicine with others. What if I miss a dose? This does not apply. What may interact with this medication? capecitabine fluorouracil phenobarbital phenytoin primidone trimethoprim-sulfamethoxazole This list may not describe all possible interactions. Give your health care provider a list of all the medicines, herbs, non-prescription drugs, or dietary supplements you use. Also tell them if you smoke, drink alcohol, or use illegal drugs. Some items may interact with your medicine. What should I watch for while using this medication? Your condition will be monitored carefully while you are receiving this medicine. This medicine may  increase the side effects of 5-fluorouracil, 5-FU. Tell your doctor or health care professional if you have diarrhea or mouth sores that do not get better or that get worse. What side effects may I notice from receiving this medication? Side effects that you should report to your doctor or health care professional as soon as possible: allergic reactions like skin rash, itching or hives, swelling of the face, lips, or tongue breathing problems fever, infection mouth sores unusual bleeding or bruising unusually weak or tired Side effects that usually do not require medical attention (report to your doctor or health care professional if they continue or are bothersome): constipation or diarrhea loss of appetite nausea, vomiting This list may not describe all possible side effects. Call your doctor for medical advice about side  effects. You may report side effects to FDA at 1-800-FDA-1088. Where should I keep my medication? This drug is given in a hospital or clinic and will not be stored at home. NOTE: This sheet is a summary. It may not cover all possible information. If you have questions about this medicine, talk to your doctor, pharmacist, or health care provider.  2022 Elsevier/Gold Standard (2007-10-17 00:00:00)  Fluorouracil, 5-FU injection What is this medication? FLUOROURACIL, 5-FU (flure oh YOOR a sil) is a chemotherapy drug. It slows the growth of cancer cells. This medicine is used to treat many types of cancer like breast cancer, colon or rectal cancer, pancreatic cancer, and stomach cancer. This medicine may be used for other purposes; ask your health care provider or pharmacist if you have questions. COMMON BRAND NAME(S): Adrucil What should I tell my care team before I take this medication? They need to know if you have any of these conditions: blood disorders dihydropyrimidine dehydrogenase (DPD) deficiency infection (especially a virus infection such as chickenpox, cold  sores, or herpes) kidney disease liver disease malnourished, poor nutrition recent or ongoing radiation therapy an unusual or allergic reaction to fluorouracil, other chemotherapy, other medicines, foods, dyes, or preservatives pregnant or trying to get pregnant breast-feeding How should I use this medication? This drug is given as an infusion or injection into a vein. It is administered in a hospital or clinic by a specially trained health care professional. Talk to your pediatrician regarding the use of this medicine in children. Special care may be needed. Overdosage: If you think you have taken too much of this medicine contact a poison control center or emergency room at once. NOTE: This medicine is only for you. Do not share this medicine with others. What if I miss a dose? It is important not to miss your dose. Call your doctor or health care professional if you are unable to keep an appointment. What may interact with this medication? Do not take this medicine with any of the following medications: live virus vaccines This medicine may also interact with the following medications: medicines that treat or prevent blood clots like warfarin, enoxaparin, and dalteparin This list may not describe all possible interactions. Give your health care provider a list of all the medicines, herbs, non-prescription drugs, or dietary supplements you use. Also tell them if you smoke, drink alcohol, or use illegal drugs. Some items may interact with your medicine. What should I watch for while using this medication? Visit your doctor for checks on your progress. This drug may make you feel generally unwell. This is not uncommon, as chemotherapy can affect healthy cells as well as cancer cells. Report any side effects. Continue your course of treatment even though you feel ill unless your doctor tells you to stop. In some cases, you may be given additional medicines to help with side effects. Follow all  directions for their use. Call your doctor or health care professional for advice if you get a fever, chills or sore throat, or other symptoms of a cold or flu. Do not treat yourself. This drug decreases your body's ability to fight infections. Try to avoid being around people who are sick. This medicine may increase your risk to bruise or bleed. Call your doctor or health care professional if you notice any unusual bleeding. Be careful brushing and flossing your teeth or using a toothpick because you may get an infection or bleed more easily. If you have any dental work done, tell your dentist  you are receiving this medicine. Avoid taking products that contain aspirin, acetaminophen, ibuprofen, naproxen, or ketoprofen unless instructed by your doctor. These medicines may hide a fever. Do not become pregnant while taking this medicine. Women should inform their doctor if they wish to become pregnant or think they might be pregnant. There is a potential for serious side effects to an unborn child. Talk to your health care professional or pharmacist for more information. Do not breast-feed an infant while taking this medicine. Men should inform their doctor if they wish to father a child. This medicine may lower sperm counts. Do not treat diarrhea with over the counter products. Contact your doctor if you have diarrhea that lasts more than 2 days or if it is severe and watery. This medicine can make you more sensitive to the sun. Keep out of the sun. If you cannot avoid being in the sun, wear protective clothing and use sunscreen. Do not use sun lamps or tanning beds/booths. What side effects may I notice from receiving this medication? Side effects that you should report to your doctor or health care professional as soon as possible: allergic reactions like skin rash, itching or hives, swelling of the face, lips, or tongue low blood counts - this medicine may decrease the number of white blood cells, red  blood cells and platelets. You may be at increased risk for infections and bleeding. signs of infection - fever or chills, cough, sore throat, pain or difficulty passing urine signs of decreased platelets or bleeding - bruising, pinpoint red spots on the skin, black, tarry stools, blood in the urine signs of decreased red blood cells - unusually weak or tired, fainting spells, lightheadedness breathing problems changes in vision chest pain mouth sores nausea and vomiting pain, swelling, redness at site where injected pain, tingling, numbness in the hands or feet redness, swelling, or sores on hands or feet stomach pain unusual bleeding Side effects that usually do not require medical attention (report to your doctor or health care professional if they continue or are bothersome): changes in finger or toe nails diarrhea dry or itchy skin hair loss headache loss of appetite sensitivity of eyes to the light stomach upset unusually teary eyes This list may not describe all possible side effects. Call your doctor for medical advice about side effects. You may report side effects to FDA at 1-800-FDA-1088. Where should I keep my medication? This drug is given in a hospital or clinic and will not be stored at home. NOTE: This sheet is a summary. It may not cover all possible information. If you have questions about this medicine, talk to your doctor, pharmacist, or health care provider.  2022 Elsevier/Gold Standard (2020-12-28 00:00:00)

## 2021-06-13 NOTE — Patient Instructions (Signed)

## 2021-06-15 ENCOUNTER — Other Ambulatory Visit: Payer: Self-pay

## 2021-06-15 ENCOUNTER — Inpatient Hospital Stay: Payer: 59

## 2021-06-15 VITALS — BP 140/59 | HR 62 | Temp 98.4°F | Resp 18

## 2021-06-15 DIAGNOSIS — C189 Malignant neoplasm of colon, unspecified: Secondary | ICD-10-CM

## 2021-06-15 DIAGNOSIS — Z5111 Encounter for antineoplastic chemotherapy: Secondary | ICD-10-CM | POA: Diagnosis not present

## 2021-06-15 MED ORDER — HEPARIN SOD (PORK) LOCK FLUSH 100 UNIT/ML IV SOLN
500.0000 [IU] | Freq: Once | INTRAVENOUS | Status: AC | PRN
  Administered 2021-06-15: 500 [IU]

## 2021-06-15 MED ORDER — SODIUM CHLORIDE 0.9% FLUSH
10.0000 mL | INTRAVENOUS | Status: DC | PRN
  Administered 2021-06-15: 10 mL

## 2021-06-15 NOTE — Progress Notes (Signed)
Interpreter Wynelle Bourgeois present during visit to translate for patient during entire visit.

## 2021-06-15 NOTE — Patient Instructions (Signed)

## 2021-06-26 ENCOUNTER — Other Ambulatory Visit: Payer: Self-pay | Admitting: Oncology

## 2021-06-27 ENCOUNTER — Encounter: Payer: Self-pay | Admitting: *Deleted

## 2021-06-27 ENCOUNTER — Inpatient Hospital Stay: Payer: 59 | Attending: Oncology

## 2021-06-27 ENCOUNTER — Inpatient Hospital Stay (HOSPITAL_BASED_OUTPATIENT_CLINIC_OR_DEPARTMENT_OTHER): Payer: 59 | Admitting: Oncology

## 2021-06-27 ENCOUNTER — Other Ambulatory Visit: Payer: Self-pay

## 2021-06-27 ENCOUNTER — Inpatient Hospital Stay: Payer: 59

## 2021-06-27 VITALS — BP 143/63 | HR 75 | Temp 97.8°F | Resp 18 | Ht 68.0 in | Wt 176.6 lb

## 2021-06-27 VITALS — BP 145/61 | HR 56

## 2021-06-27 DIAGNOSIS — C188 Malignant neoplasm of overlapping sites of colon: Secondary | ICD-10-CM | POA: Insufficient documentation

## 2021-06-27 DIAGNOSIS — G629 Polyneuropathy, unspecified: Secondary | ICD-10-CM | POA: Insufficient documentation

## 2021-06-27 DIAGNOSIS — Z5111 Encounter for antineoplastic chemotherapy: Secondary | ICD-10-CM | POA: Insufficient documentation

## 2021-06-27 DIAGNOSIS — C189 Malignant neoplasm of colon, unspecified: Secondary | ICD-10-CM

## 2021-06-27 DIAGNOSIS — C779 Secondary and unspecified malignant neoplasm of lymph node, unspecified: Secondary | ICD-10-CM | POA: Diagnosis not present

## 2021-06-27 DIAGNOSIS — D696 Thrombocytopenia, unspecified: Secondary | ICD-10-CM | POA: Diagnosis not present

## 2021-06-27 DIAGNOSIS — D509 Iron deficiency anemia, unspecified: Secondary | ICD-10-CM | POA: Insufficient documentation

## 2021-06-27 LAB — CBC WITH DIFFERENTIAL (CANCER CENTER ONLY)
Abs Immature Granulocytes: 0.01 10*3/uL (ref 0.00–0.07)
Basophils Absolute: 0 10*3/uL (ref 0.0–0.1)
Basophils Relative: 0 %
Eosinophils Absolute: 0.1 10*3/uL (ref 0.0–0.5)
Eosinophils Relative: 3 %
HCT: 37.4 % — ABNORMAL LOW (ref 39.0–52.0)
Hemoglobin: 12.1 g/dL — ABNORMAL LOW (ref 13.0–17.0)
Immature Granulocytes: 0 %
Lymphocytes Relative: 26 %
Lymphs Abs: 0.9 10*3/uL (ref 0.7–4.0)
MCH: 28.5 pg (ref 26.0–34.0)
MCHC: 32.4 g/dL (ref 30.0–36.0)
MCV: 88 fL (ref 80.0–100.0)
Monocytes Absolute: 0.7 10*3/uL (ref 0.1–1.0)
Monocytes Relative: 19 %
Neutro Abs: 1.8 10*3/uL (ref 1.7–7.7)
Neutrophils Relative %: 52 %
Platelet Count: 108 10*3/uL — ABNORMAL LOW (ref 150–400)
RBC: 4.25 MIL/uL (ref 4.22–5.81)
RDW: 17.3 % — ABNORMAL HIGH (ref 11.5–15.5)
WBC Count: 3.5 10*3/uL — ABNORMAL LOW (ref 4.0–10.5)
nRBC: 0 % (ref 0.0–0.2)

## 2021-06-27 LAB — CMP (CANCER CENTER ONLY)
ALT: 13 U/L (ref 0–44)
AST: 20 U/L (ref 15–41)
Albumin: 3.7 g/dL (ref 3.5–5.0)
Alkaline Phosphatase: 79 U/L (ref 38–126)
Anion gap: 7 (ref 5–15)
BUN: 18 mg/dL (ref 8–23)
CO2: 24 mmol/L (ref 22–32)
Calcium: 9.1 mg/dL (ref 8.9–10.3)
Chloride: 107 mmol/L (ref 98–111)
Creatinine: 0.9 mg/dL (ref 0.61–1.24)
GFR, Estimated: >60 mL/min (ref >60)
Glucose, Bld: 120 mg/dL — ABNORMAL HIGH (ref 70–99)
Potassium: 3.9 mmol/L (ref 3.5–5.1)
Sodium: 138 mmol/L (ref 135–145)
Total Bilirubin: 0.4 mg/dL (ref 0.3–1.2)
Total Protein: 6.4 g/dL — ABNORMAL LOW (ref 6.5–8.1)

## 2021-06-27 LAB — CEA (ACCESS): CEA (CHCC): 3.07 ng/mL (ref 0.00–5.00)

## 2021-06-27 MED ORDER — FLUOROURACIL CHEMO INJECTION 2.5 GM/50ML
300.0000 mg/m2 | Freq: Once | INTRAVENOUS | Status: AC
  Administered 2021-06-27: 600 mg via INTRAVENOUS
  Filled 2021-06-27: qty 12

## 2021-06-27 MED ORDER — OXALIPLATIN CHEMO INJECTION 100 MG/20ML
50.0000 mg/m2 | Freq: Once | INTRAVENOUS | Status: AC
  Administered 2021-06-27: 95 mg via INTRAVENOUS
  Filled 2021-06-27: qty 19

## 2021-06-27 MED ORDER — LEUCOVORIN CALCIUM INJECTION 350 MG
300.0000 mg/m2 | Freq: Once | INTRAVENOUS | Status: AC
  Administered 2021-06-27: 580 mg via INTRAVENOUS
  Filled 2021-06-27: qty 29

## 2021-06-27 MED ORDER — PALONOSETRON HCL INJECTION 0.25 MG/5ML
0.2500 mg | Freq: Once | INTRAVENOUS | Status: AC
  Administered 2021-06-27: 0.25 mg via INTRAVENOUS
  Filled 2021-06-27: qty 5

## 2021-06-27 MED ORDER — SODIUM CHLORIDE 0.9 % IV SOLN
10.0000 mg | Freq: Once | INTRAVENOUS | Status: AC
  Administered 2021-06-27: 10 mg via INTRAVENOUS
  Filled 2021-06-27: qty 10

## 2021-06-27 MED ORDER — SODIUM CHLORIDE 0.9 % IV SOLN
2000.0000 mg/m2 | INTRAVENOUS | Status: DC
  Administered 2021-06-27: 3850 mg via INTRAVENOUS
  Filled 2021-06-27: qty 77

## 2021-06-27 MED ORDER — DEXTROSE 5 % IV SOLN: Freq: Once | INTRAVENOUS | Status: AC

## 2021-06-27 NOTE — Progress Notes (Signed)
[place] interpreter from [place] at chairside to interpret  ?

## 2021-06-27 NOTE — Progress Notes (Signed)
?[facility] ?OFFICE PROGRESS NOTE ? ? ?Diagnosis: Colon cancer ? ?INTERVAL HISTORY:  ? ?John Vance completed another cycle of FOLFOX on 06/13/2021.  No nausea/vomiting or diarrhea.  He has activity in the hands when he touches cold.  No peripheral numbness.  No cold sensitivity in the feet.  He feels well. ? ?Objective: ? ?Vital signs in last 24 hours: ? ?Blood pressure (!) 143/63, pulse 75, temperature 97.8 ?F (36.6 ?C), temperature source Oral, resp. rate 18, height 5' 8"  (1.727 m), weight 176 lb 9.6 oz (80.1 kg), SpO2 100 %. ?  ? ?HEENT: No thrush or ulcers ?Resp: Lungs clear bilaterally ?Cardio: Regular rate and rhythm ?GI: No hepatosplenomegaly ?Vascular: No leg edema ?Neuro: Moderate loss of vibratory sense at the fingertips bilaterally ?Skin: Mild hyperpigmentation of the hands ? ?Portacath/PICC-without erythema ? ?Lab Results: ? ?Lab Results  ?Component Value Date  ? WBC 3.5 (L) 06/27/2021  ? HGB 12.1 (L) 06/27/2021  ? HCT 37.4 (L) 06/27/2021  ? MCV 88.0 06/27/2021  ? PLT 108 (L) 06/27/2021  ? NEUTROABS 1.8 06/27/2021  ? ? ?CMP  ?Lab Results  ?Component Value Date  ? NA 139 06/13/2021  ? K 4.2 06/13/2021  ? CL 106 06/13/2021  ? CO2 25 06/13/2021  ? GLUCOSE 104 (H) 06/13/2021  ? BUN 18 06/13/2021  ? CREATININE 0.89 06/13/2021  ? CALCIUM 9.4 06/13/2021  ? PROT 6.6 06/13/2021  ? ALBUMIN 3.8 06/13/2021  ? AST 22 06/13/2021  ? ALT 15 06/13/2021  ? ALKPHOS 80 06/13/2021  ? BILITOT 0.4 06/13/2021  ? GFRNONAA >60 06/13/2021  ? GFRAA >60 12/01/2019  ? ? ?Lab Results  ?Component Value Date  ? CEA1 12.10 (H) 01/03/2021  ? CEA 2.72 06/13/2021  ? ? ? ?Medications: I have reviewed the patient's current medications. ? ? ?Assessment/Plan: ?Sigmoid colon cancer, T2N0, stage I, status post a left hemicolectomy on 12/06/2018; cecum/proximal ascending colon cancer stage IIIB (T3N1b), transverse colon cancer stage IIB (T4aN0) status post extended right hemicolectomy 05/07/2019, MSS ?Tumor invasive superficial portion  of the muscle ("internal third "), no tumor perforation, no vascular invasion or perineural invasion, negative resection margins, 0/4 lymph nodes ?Elevated CEA 02/10/2019 ?CTs 03/05/2019, compared to outside CTs from 11/22/2018-worsened wall thickening in the cecum stable apple core lesion in the mid transverse colon, no evidence of recurrent tumor at the distal colonic anastomosis, no evidence of metastatic disease ?Colonoscopy 03/24/2019 20-2 malignant appearing masses noted in the colon, 1 filled the cecum measuring approximately 4 cm across and friable.  The other malignant appearing mass was circumferential creating a stricture with a 1.2 cm lumen located in the transverse segment approximately 4 to 5 cm in length.  There were 12-18 neoplastic appearing polyps in between the 2 obvious malignant masses ranging in size from 3 mm to 2 cm.  5 polyps distal to the transverse colon mass.  2 sigmoid colon polyps.  Cecum mass positive for adenocarcinoma.  Transverse colon mass positive for adenocarcinoma. ?Foundation 1 transverse colon-MSS, tumor mutation burden 7,KRAS wild-type, BRAF fusion ?05/07/2019 laparoscopic extended right hemicolectomy, lysis of adhesions-5 cm invasive moderately differentiated adenocarcinoma involving cecum and proximal ascending colon, carcinoma invades into the pericolonic soft tissue; 7 cm invasive moderately differentiated carcinoma involving the transverse colon, carcinoma invades into the serosal surface (visceral peritoneum); all resection margins negative for carcinoma.  Lymphovascular invasion present.  In the proximal colon metastatic carcinoma involves 3 of 17 lymph nodes with 1 tumor deposit.  In the transverse colon 12 lymph  nodes negative for metastatic carcinoma.  3 separate polyps: Tubular adenoma, inflammatory polyp and hyperplastic polyp ?Cycle 1 capecitabine 06/02/2019 ?Cycle 2 capecitabine 06/23/2019 ?Cycle 3 capecitabine 07/14/2019 ?Cycle 4 capecitabine 08/04/2019 ?Cycle 5  capecitabine 08/25/2019 ?Cycle 6 capecitabine 09/15/2019 ?Cycle 7 capecitabine 10/06/2019 ?Cycle 8 capecitabine 10/27/2019 ?Upper endoscopy 07/23/2020-negative ?Colonoscopy 07/23/2020-normal-appearing anastomoses, polyp removed from the descending colon-tubular adenoma ?CTs 01/18/2021-right abdominal mesenteric mass, mass in the low anatomic pelvis consistent with metastases ?Cycle 1 FOLFOX 02/21/2021 ?Cycle 2 FOLFOX 03/07/2021, dose reductions made due to mild neutropenia ?Cycle 3 FOLFOX 03/21/2021 ?Cycle 4 FOLFOX 04/04/2021 ?Cycle 5 FOLFOX 04/19/2021 ?Cycle 6 FOLFOX 05/02/2021 ?CTs 05/12/2021-decrease size of nodular right mesenteric mass, complete resolution of soft tissue enhancing nodule in the right inguinal canal, resolution of nodular soft tissue mass posterior to the bladder, enlargement of a left upper lobe nodule and new 4 mm left upper lobe nodule-indeterminate ?Cycle 7 FOLFOX 05/16/2021 ?Cycle 8 FOLFOX 05/30/2021, oxaliplatin and 5-FU bolus held due to neuropathy and thrombocytopenia ?Cycle 9 FOLFOX 06/13/2021, oxaliplatin dose reduced ?Cycle 10 FOLFOX 06/27/2021 ?2.   Microcytic anemia secondary to #1.    Resolved ?3.   Family history of pancreas and colon cancer, negative genetic testing per the [facility] panel, ATM variant of unknown significance ?4.   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  ? ? ? ?Disposition: ?John Vance appears stable.  Completed 9 cycles of FOLFOX.  He has mild oxaliplatin neuropathy symptoms.  He will complete cycle 10 today. John Vance to go restaging CTs after this cycle.  He will return for an office visit after the restaging CTs. ? ?Betsy Coder, MD ? ?06/27/2021  ?8:50 AM ? ? ?

## 2021-06-27 NOTE — Patient Instructions (Addendum)
[place]   The chemotherapy medication bag should finish at 46 hours, 96 hours, or 7 days. For example, if your pump is scheduled for 46 hours and it was put on at 4:00 p.m., it should finish at 2:00 p.m. the day it is scheduled to come off regardless of your appointment time.     Estimated time to finish at 11:30 Wednesday June 29, 2021.   If the display on your pump reads "Low Volume" and it is beeping, take the batteries out of the pump and come to the cancer center for it to be taken off.   If the pump alarms go off prior to the pump reading "Low Volume" then call [phone] and someone can assist you.  If the plunger comes out and the chemotherapy medication is leaking out, please use your home chemo spill kit to clean up the spill. Do NOT use paper towels or other household products.  If you have problems or questions regarding your pump, please call either [phone] (24 hours a day) or the cancer center Monday-Friday 8:00 a.m.- 4:30 p.m. at the clinic number and we will assist you. If you are unable to get assistance, then go to the nearest Emergency Department and ask the staff to contact the IV team for assistance.  Discharge Instructions: Thank you for choosing [facility] to provide your oncology and hematology care.   If you have a lab appointment with the [facility], please go directly to the [facility] and check in at the registration area.   Wear comfortable clothing and clothing appropriate for easy access to any Portacath or PICC line.   We strive to give you quality time with your provider. You may need to reschedule your appointment if you arrive late (15 or more minutes).  Arriving late affects you and other patients whose appointments are after yours.  Also, if you miss three or more appointments without notifying the office, you may be dismissed from the clinic at the providers discretion.      For prescription  refill requests, have your pharmacy contact our office and allow 72 hours for refills to be completed.    Today you received the following chemotherapy and/or immunotherapy agents Oxaliplatin, leucovorin, fluorouracil.      To help prevent nausea and vomiting after your treatment, we encourage you to take your nausea medication as directed.  BELOW ARE SYMPTOMS THAT SHOULD BE REPORTED IMMEDIATELY: *FEVER GREATER THAN 100.4 F (38 C) OR HIGHER *CHILLS OR SWEATING *NAUSEA AND VOMITING THAT IS NOT CONTROLLED WITH YOUR NAUSEA MEDICATION *UNUSUAL SHORTNESS OF BREATH *UNUSUAL BRUISING OR BLEEDING *URINARY PROBLEMS (pain or burning when urinating, or frequent urination) *BOWEL PROBLEMS (unusual diarrhea, constipation, pain near the anus) TENDERNESS IN MOUTH AND THROAT WITH OR WITHOUT PRESENCE OF ULCERS (sore throat, sores in mouth, or a toothache) UNUSUAL RASH, SWELLING OR PAIN  UNUSUAL VAGINAL DISCHARGE OR ITCHING   Items with * indicate a potential emergency and should be followed up as soon as possible or go to the Emergency Department if any problems should occur.  Please show the CHEMOTHERAPY ALERT CARD or IMMUNOTHERAPY ALERT CARD at check-in to the Emergency Department and triage nurse.  Should you have questions after your visit or need to cancel or reschedule your appointment, please contact [facility]  Dept: [phone]  and follow the prompts.  Office hours are 8:00 a.m. to 4:30 p.m. Monday - Friday. Please note that voicemails  left after 4:00 p.m. may not be returned until the following business day.  We are closed weekends and major holidays. You have access to a nurse at all times for urgent questions. Please call the main number to the clinic Dept: [phone] and follow the prompts.   For any non-urgent questions, you may also contact your provider using MyChart. We now offer e-Visits for anyone 63 and older to request care online for non-urgent  symptoms. For details visit [email or web address].   Also download the MyChart app! Go to the app store, search "MyChart", open the app, select [facility], and log in with your MyChart username and password.  Due to Covid, a mask is required upon entering the hospital/clinic. If you do not have a mask, one will be given to you upon arrival. For doctor visits, patients may have 1 support person aged 70 or older with them. For treatment visits, patients cannot have anyone with them due to current Covid guidelines and our immunocompromised population.   Oxaliplatin Injection What is this medication? OXALIPLATIN (ox AL i PLA tin) is a chemotherapy drug. It targets fast dividing cells, like cancer cells, and causes these cells to die. This medicine is used to treat cancers of the colon and rectum, and many other cancers. This medicine may be used for other purposes; ask your health care provider or pharmacist if you have questions. COMMON BRAND NAME(S): Eloxatin What should I tell my care team before I take this medication? They need to know if you have any of these conditions: heart disease history of irregular heartbeat liver disease low blood counts, like white cells, platelets, or red blood cells lung or breathing disease, like asthma take medicines that treat or prevent blood clots tingling of the fingers or toes, or other nerve disorder an unusual or allergic reaction to oxaliplatin, other chemotherapy, other medicines, foods, dyes, or preservatives pregnant or trying to get pregnant breast-feeding How should I use this medication? This drug is given as an infusion into a vein. It is administered in a hospital or clinic by a specially trained health care professional. Talk to your pediatrician regarding the use of this medicine in children. Special care may be needed. Overdosage: If you think you have taken too much of this medicine contact a poison control center or emergency room at  once. NOTE: This medicine is only for you. Do not share this medicine with others. What if I miss a dose? It is important not to miss a dose. Call your doctor or health care professional if you are unable to keep an appointment. What may interact with this medication? Do not take this medicine with any of the following medications: cisapride dronedarone pimozide thioridazine This medicine may also interact with the following medications: aspirin and aspirin-like medicines certain medicines that treat or prevent blood clots like warfarin, apixaban, dabigatran, and rivaroxaban cisplatin cyclosporine diuretics medicines for infection like acyclovir, adefovir, amphotericin B, bacitracin, cidofovir, foscarnet, ganciclovir, gentamicin, pentamidine, vancomycin NSAIDs, medicines for pain and inflammation, like ibuprofen or naproxen other medicines that prolong the QT interval (an abnormal heart rhythm) pamidronate zoledronic acid This list may not describe all possible interactions. Give your health care provider a list of all the medicines, herbs, non-prescription drugs, or dietary supplements you use. Also tell them if you smoke, drink alcohol, or use illegal drugs. Some items may interact with your medicine. What should I watch for while using this medication? Your condition will be monitored carefully while you are receiving this  medicine. You may need blood work done while you are taking this medicine. This medicine may make you feel generally unwell. This is not uncommon as chemotherapy can affect healthy cells as well as cancer cells. Report any side effects. Continue your course of treatment even though you feel ill unless your healthcare professional tells you to stop. This medicine can make you more sensitive to cold. Do not drink cold drinks or use ice. Cover exposed skin before coming in contact with cold temperatures or cold objects. When out in cold weather wear warm clothing and  cover your mouth and nose to warm the air that goes into your lungs. Tell your doctor if you get sensitive to the cold. Do not become pregnant while taking this medicine or for 9 months after stopping it. Women should inform their health care professional if they wish to become pregnant or think they might be pregnant. Men should not father a child while taking this medicine and for 6 months after stopping it. There is potential for serious side effects to an unborn child. Talk to your health care professional for more information. Do not breast-feed a child while taking this medicine or for 3 months after stopping it. This medicine has caused ovarian failure in some women. This medicine may make it more difficult to get pregnant. Talk to your health care professional if you are concerned about your fertility. This medicine has caused decreased sperm counts in some men. This may make it more difficult to father a child. Talk to your health care professional if you are concerned about your fertility. This medicine may increase your risk of getting an infection. Call your health care professional for advice if you get a fever, chills, or sore throat, or other symptoms of a cold or flu. Do not treat yourself. Try to avoid being around people who are sick. Avoid taking medicines that contain aspirin, acetaminophen, ibuprofen, naproxen, or ketoprofen unless instructed by your health care professional. These medicines may hide a fever. Be careful brushing or flossing your teeth or using a toothpick because you may get an infection or bleed more easily. If you have any dental work done, tell your dentist you are receiving this medicine. What side effects may I notice from receiving this medication? Side effects that you should report to your doctor or health care professional as soon as possible: allergic reactions like skin rash, itching or hives, swelling of the face, lips, or tongue breathing  problems cough low blood counts - this medicine may decrease the number of white blood cells, red blood cells, and platelets. You may be at increased risk for infections and bleeding nausea, vomiting pain, redness, or irritation at site where injected pain, tingling, numbness in the hands or feet signs and symptoms of bleeding such as bloody or black, tarry stools; red or dark brown urine; spitting up blood or brown material that looks like coffee grounds; red spots on the skin; unusual bruising or bleeding from the eyes, gums, or nose signs and symptoms of a dangerous change in heartbeat or heart rhythm like chest pain; dizziness; fast, irregular heartbeat; palpitations; feeling faint or lightheaded; falls signs and symptoms of infection like fever; chills; cough; sore throat; pain or trouble passing urine signs and symptoms of liver injury like dark yellow or brown urine; general ill feeling or flu-like symptoms; light-colored stools; loss of appetite; nausea; right upper belly pain; unusually weak or tired; yellowing of the eyes or skin signs and symptoms of  low red blood cells or anemia such as unusually weak or tired; feeling faint or lightheaded; falls signs and symptoms of muscle injury like dark urine; trouble passing urine or change in the amount of urine; unusually weak or tired; muscle pain; back pain Side effects that usually do not require medical attention (report to your doctor or health care professional if they continue or are bothersome): changes in taste diarrhea gas hair loss loss of appetite mouth sores This list may not describe all possible side effects. Call your doctor for medical advice about side effects. You may report side effects to FDA at 1-800-FDA-1088. Where should I keep my medication? This drug is given in a hospital or clinic and will not be stored at home. NOTE: This sheet is a summary. It may not cover all possible information. If you have questions about  this medicine, talk to your doctor, pharmacist, or health care provider.  2022 Elsevier/Gold Standard (2020-12-28 00:00:00)  Leucovorin injection What is this medication? LEUCOVORIN (loo koe VOR in) is used to prevent or treat the harmful effects of some medicines. This medicine is used to treat anemia caused by a low amount of folic acid in the body. It is also used with 5-fluorouracil (5-FU) to treat colon cancer. This medicine may be used for other purposes; ask your health care provider or pharmacist if you have questions. What should I tell my care team before I take this medication? They need to know if you have any of these conditions: anemia from low levels of vitamin B-12 in the blood an unusual or allergic reaction to leucovorin, folic acid, other medicines, foods, dyes, or preservatives pregnant or trying to get pregnant breast-feeding How should I use this medication? This medicine is for injection into a muscle or into a vein. It is given by a health care professional in a hospital or clinic setting. Talk to your pediatrician regarding the use of this medicine in children. Special care may be needed. Overdosage: If you think you have taken too much of this medicine contact a poison control center or emergency room at once. NOTE: This medicine is only for you. Do not share this medicine with others. What if I miss a dose? This does not apply. What may interact with this medication? capecitabine fluorouracil phenobarbital phenytoin primidone trimethoprim-sulfamethoxazole This list may not describe all possible interactions. Give your health care provider a list of all the medicines, herbs, non-prescription drugs, or dietary supplements you use. Also tell them if you smoke, drink alcohol, or use illegal drugs. Some items may interact with your medicine. What should I watch for while using this medication? Your condition will be monitored carefully while you are receiving this  medicine. This medicine may increase the side effects of 5-fluorouracil, 5-FU. Tell your doctor or health care professional if you have diarrhea or mouth sores that do not get better or that get worse. What side effects may I notice from receiving this medication? Side effects that you should report to your doctor or health care professional as soon as possible: allergic reactions like skin rash, itching or hives, swelling of the face, lips, or tongue breathing problems fever, infection mouth sores unusual bleeding or bruising unusually weak or tired Side effects that usually do not require medical attention (report to your doctor or health care professional if they continue or are bothersome): constipation or diarrhea loss of appetite nausea, vomiting This list may not describe all possible side effects. Call your doctor for medical  advice about side effects. You may report side effects to FDA at 1-800-FDA-1088. Where should I keep my medication? This drug is given in a hospital or clinic and will not be stored at home. NOTE: This sheet is a summary. It may not cover all possible information. If you have questions about this medicine, talk to your doctor, pharmacist, or health care provider.  2022 Elsevier/Gold Standard (2007-10-17 00:00:00)  Fluorouracil, 5-FU injection What is this medication? FLUOROURACIL, 5-FU (flure oh YOOR a sil) is a chemotherapy drug. It slows the growth of cancer cells. This medicine is used to treat many types of cancer like breast cancer, colon or rectal cancer, pancreatic cancer, and stomach cancer. This medicine may be used for other purposes; ask your health care provider or pharmacist if you have questions. COMMON BRAND NAME(S): Adrucil What should I tell my care team before I take this medication? They need to know if you have any of these conditions: blood disorders dihydropyrimidine dehydrogenase (DPD) deficiency infection (especially a virus  infection such as chickenpox, cold sores, or herpes) kidney disease liver disease malnourished, poor nutrition recent or ongoing radiation therapy an unusual or allergic reaction to fluorouracil, other chemotherapy, other medicines, foods, dyes, or preservatives pregnant or trying to get pregnant breast-feeding How should I use this medication? This drug is given as an infusion or injection into a vein. It is administered in a hospital or clinic by a specially trained health care professional. Talk to your pediatrician regarding the use of this medicine in children. Special care may be needed. Overdosage: If you think you have taken too much of this medicine contact a poison control center or emergency room at once. NOTE: This medicine is only for you. Do not share this medicine with others. What if I miss a dose? It is important not to miss your dose. Call your doctor or health care professional if you are unable to keep an appointment. What may interact with this medication? Do not take this medicine with any of the following medications: live virus vaccines This medicine may also interact with the following medications: medicines that treat or prevent blood clots like warfarin, enoxaparin, and dalteparin This list may not describe all possible interactions. Give your health care provider a list of all the medicines, herbs, non-prescription drugs, or dietary supplements you use. Also tell them if you smoke, drink alcohol, or use illegal drugs. Some items may interact with your medicine. What should I watch for while using this medication? Visit your doctor for checks on your progress. This drug may make you feel generally unwell. This is not uncommon, as chemotherapy can affect healthy cells as well as cancer cells. Report any side effects. Continue your course of treatment even though you feel ill unless your doctor tells you to stop. In some cases, you may be given additional medicines to  help with side effects. Follow all directions for their use. Call your doctor or health care professional for advice if you get a fever, chills or sore throat, or other symptoms of a cold or flu. Do not treat yourself. This drug decreases your body's ability to fight infections. Try to avoid being around people who are sick. This medicine may increase your risk to bruise or bleed. Call your doctor or health care professional if you notice any unusual bleeding. Be careful brushing and flossing your teeth or using a toothpick because you may get an infection or bleed more easily. If you have any dental work done,  tell your dentist you are receiving this medicine. Avoid taking products that contain aspirin, acetaminophen, ibuprofen, naproxen, or ketoprofen unless instructed by your doctor. These medicines may hide a fever. Do not become pregnant while taking this medicine. Women should inform their doctor if they wish to become pregnant or think they might be pregnant. There is a potential for serious side effects to an unborn child. Talk to your health care professional or pharmacist for more information. Do not breast-feed an infant while taking this medicine. Men should inform their doctor if they wish to father a child. This medicine may lower sperm counts. Do not treat diarrhea with over the counter products. Contact your doctor if you have diarrhea that lasts more than 2 days or if it is severe and watery. This medicine can make you more sensitive to the sun. Keep out of the sun. If you cannot avoid being in the sun, wear protective clothing and use sunscreen. Do not use sun lamps or tanning beds/booths. What side effects may I notice from receiving this medication? Side effects that you should report to your doctor or health care professional as soon as possible: allergic reactions like skin rash, itching or hives, swelling of the face, lips, or tongue low blood counts - this medicine may decrease  the number of white blood cells, red blood cells and platelets. You may be at increased risk for infections and bleeding. signs of infection - fever or chills, cough, sore throat, pain or difficulty passing urine signs of decreased platelets or bleeding - bruising, pinpoint red spots on the skin, black, tarry stools, blood in the urine signs of decreased red blood cells - unusually weak or tired, fainting spells, lightheadedness breathing problems changes in vision chest pain mouth sores nausea and vomiting pain, swelling, redness at site where injected pain, tingling, numbness in the hands or feet redness, swelling, or sores on hands or feet stomach pain unusual bleeding Side effects that usually do not require medical attention (report to your doctor or health care professional if they continue or are bothersome): changes in finger or toe nails diarrhea dry or itchy skin hair loss headache loss of appetite sensitivity of eyes to the light stomach upset unusually teary eyes This list may not describe all possible side effects. Call your doctor for medical advice about side effects. You may report side effects to FDA at 1-800-FDA-1088. Where should I keep my medication? This drug is given in a hospital or clinic and will not be stored at home. NOTE: This sheet is a summary. It may not cover all possible information. If you have questions about this medicine, talk to your doctor, pharmacist, or health care provider.  2022 Elsevier/Gold Standard (2020-12-28 00:00:00) \[id number]\

## 2021-06-27 NOTE — Progress Notes (Signed)
Patient seen by Dr. Sherrill today ? ?Vitals are within treatment parameters. ? ?Labs reviewed by Dr. Sherrill and are within treatment parameters. ? ?Per physician team, patient is ready for treatment and there are NO modifications to the treatment plan.  ?

## 2021-06-27 NOTE — Progress Notes (Signed)
[place] interpreter from [place] at chairside. States she is scheduled to stay for 1 hour. ?

## 2021-06-27 NOTE — Progress Notes (Signed)
Patient presents for treatment. RN assessment completed along with the following: ? ?Labs/vitals reviewed - Yes, and within treatment parameters.   ?Weight within 10% of previous measurement - Yes ?Informed consent completed and reflects current therapy/intent - Yes, on date 02/21/21             ?Provider progress note reviewed - Yes, today's provider note was reviewed. ?Treatment/Antibody/Supportive plan reviewed - Yes, and there are no adjustments needed for today's treatment. ?S&H and other orders reviewed - Yes, and there are no additional orders identified. ?Previous treatment date reviewed - Yes, and the appropriate amount of time has elapsed between treatments. ?Clinic Hand Off Received from - Cristy Friedlander, RN ? ? ?Patient to proceed with treatment.  ? ?

## 2021-06-29 ENCOUNTER — Other Ambulatory Visit: Payer: Self-pay

## 2021-06-29 ENCOUNTER — Inpatient Hospital Stay: Payer: 59

## 2021-06-29 VITALS — BP 151/58 | HR 62 | Temp 97.1°F | Resp 18

## 2021-06-29 DIAGNOSIS — Z5111 Encounter for antineoplastic chemotherapy: Secondary | ICD-10-CM | POA: Diagnosis not present

## 2021-06-29 DIAGNOSIS — C189 Malignant neoplasm of colon, unspecified: Secondary | ICD-10-CM

## 2021-06-29 MED ORDER — SODIUM CHLORIDE 0.9% FLUSH
10.0000 mL | INTRAVENOUS | Status: DC | PRN
  Administered 2021-06-29: 10 mL

## 2021-06-29 MED ORDER — HEPARIN SOD (PORK) LOCK FLUSH 100 UNIT/ML IV SOLN
500.0000 [IU] | Freq: Once | INTRAVENOUS | Status: AC | PRN
  Administered 2021-06-29: 500 [IU]

## 2021-07-06 ENCOUNTER — Other Ambulatory Visit: Payer: Self-pay

## 2021-07-06 ENCOUNTER — Ambulatory Visit: Payer: Self-pay | Admitting: Oncology

## 2021-07-07 ENCOUNTER — Encounter (HOSPITAL_BASED_OUTPATIENT_CLINIC_OR_DEPARTMENT_OTHER): Payer: Self-pay

## 2021-07-07 ENCOUNTER — Ambulatory Visit (HOSPITAL_BASED_OUTPATIENT_CLINIC_OR_DEPARTMENT_OTHER)
Admission: RE | Admit: 2021-07-07 | Discharge: 2021-07-07 | Disposition: A | Discharge status: Home / Self Care | Payer: 59 | Source: Ambulatory Visit | Attending: Oncology | Admitting: Oncology

## 2021-07-07 ENCOUNTER — Other Ambulatory Visit: Payer: Self-pay

## 2021-07-07 ENCOUNTER — Inpatient Hospital Stay: Payer: 59

## 2021-07-07 DIAGNOSIS — C189 Malignant neoplasm of colon, unspecified: Secondary | ICD-10-CM | POA: Diagnosis present

## 2021-07-07 MED ORDER — IOHEXOL 300 MG/ML  SOLN
100.0000 mL | Freq: Once | INTRAMUSCULAR | Status: AC | PRN
  Administered 2021-07-07: 85 mL via INTRAVENOUS

## 2021-07-10 ENCOUNTER — Other Ambulatory Visit: Payer: Self-pay | Admitting: Oncology

## 2021-07-11 ENCOUNTER — Other Ambulatory Visit: Payer: Self-pay

## 2021-07-11 ENCOUNTER — Other Ambulatory Visit (HOSPITAL_COMMUNITY): Payer: Self-pay

## 2021-07-11 ENCOUNTER — Inpatient Hospital Stay: Payer: 59

## 2021-07-11 ENCOUNTER — Inpatient Hospital Stay: Payer: 59 | Admitting: Oncology

## 2021-07-11 ENCOUNTER — Encounter: Payer: Self-pay | Admitting: Oncology

## 2021-07-11 VITALS — BP 150/67 | HR 95 | Temp 98.2°F | Resp 20 | Ht 68.0 in | Wt 176.4 lb

## 2021-07-11 DIAGNOSIS — C189 Malignant neoplasm of colon, unspecified: Secondary | ICD-10-CM

## 2021-07-11 DIAGNOSIS — Z95828 Presence of other vascular implants and grafts: Secondary | ICD-10-CM

## 2021-07-11 DIAGNOSIS — Z5111 Encounter for antineoplastic chemotherapy: Secondary | ICD-10-CM | POA: Diagnosis not present

## 2021-07-11 LAB — CMP (CANCER CENTER ONLY)
ALT: 13 U/L (ref 0–44)
AST: 21 U/L (ref 15–41)
Albumin: 3.6 g/dL (ref 3.5–5.0)
Alkaline Phosphatase: 88 U/L (ref 38–126)
Anion gap: 7 (ref 5–15)
BUN: 17 mg/dL (ref 8–23)
CO2: 25 mmol/L (ref 22–32)
Calcium: 9 mg/dL (ref 8.9–10.3)
Chloride: 107 mmol/L (ref 98–111)
Creatinine: 0.96 mg/dL (ref 0.61–1.24)
GFR, Estimated: >60 mL/min (ref >60)
Glucose, Bld: 131 mg/dL — ABNORMAL HIGH (ref 70–99)
Potassium: 3.8 mmol/L (ref 3.5–5.1)
Sodium: 139 mmol/L (ref 135–145)
Total Bilirubin: 0.4 mg/dL (ref 0.3–1.2)
Total Protein: 6.5 g/dL (ref 6.5–8.1)

## 2021-07-11 LAB — CBC WITH DIFFERENTIAL (CANCER CENTER ONLY)
Abs Immature Granulocytes: 0.01 10*3/uL (ref 0.00–0.07)
Basophils Absolute: 0 10*3/uL (ref 0.0–0.1)
Basophils Relative: 1 %
Eosinophils Absolute: 0.1 10*3/uL (ref 0.0–0.5)
Eosinophils Relative: 3 %
HCT: 37.1 % — ABNORMAL LOW (ref 39.0–52.0)
Hemoglobin: 11.9 g/dL — ABNORMAL LOW (ref 13.0–17.0)
Immature Granulocytes: 0 %
Lymphocytes Relative: 32 %
Lymphs Abs: 1 10*3/uL (ref 0.7–4.0)
MCH: 28.1 pg (ref 26.0–34.0)
MCHC: 32.1 g/dL (ref 30.0–36.0)
MCV: 87.7 fL (ref 80.0–100.0)
Monocytes Absolute: 0.6 10*3/uL (ref 0.1–1.0)
Monocytes Relative: 20 %
Neutro Abs: 1.5 10*3/uL — ABNORMAL LOW (ref 1.7–7.7)
Neutrophils Relative %: 44 %
Platelet Count: 94 10*3/uL — ABNORMAL LOW (ref 150–400)
RBC: 4.23 MIL/uL (ref 4.22–5.81)
RDW: 17.3 % — ABNORMAL HIGH (ref 11.5–15.5)
WBC Count: 3.2 10*3/uL — ABNORMAL LOW (ref 4.0–10.5)
nRBC: 0 % (ref 0.0–0.2)

## 2021-07-11 MED ORDER — SODIUM CHLORIDE 0.9% FLUSH
10.0000 mL | INTRAVENOUS | Status: AC | PRN
  Administered 2021-07-11: 10 mL via INTRAVENOUS

## 2021-07-11 MED ORDER — CAPECITABINE 500 MG PO TABS
ORAL_TABLET | ORAL | 0 refills | Status: DC
  Filled 2021-07-11: qty 84, fill #0

## 2021-07-11 MED ORDER — HEPARIN SOD (PORK) LOCK FLUSH 100 UNIT/ML IV SOLN
500.0000 [IU] | Freq: Once | INTRAVENOUS | Status: AC
  Administered 2021-07-11: 500 [IU] via INTRAVENOUS

## 2021-07-11 NOTE — Progress Notes (Signed)
?[place] ?OFFICE PROGRESS NOTE ? ? ?Diagnosis: Colon cancer ? ?INTERVAL HISTORY:  ? ?[name] completed another cycle of chemotherapy 06/27/2021.  He continues to have cold sensitivity in the extremities.  No other neuropathy symptoms.  He feels well. ? ?Objective: ? ?Vital signs in last 24 hours: ? ?Blood pressure (!) 150/67, pulse 95, temperature 98.2 ?F (36.8 ?C), temperature source Oral, resp. rate 20, height _0  (1.727 m), weight 176 lb 6.4 oz (80 kg), SpO2 100 %. ?  ? ?HEENT: No thrush or ulcers ?Resp: Lungs clear bilaterally ?Cardio: Regular rate and rhythm ?GI: No hepatosplenomegaly ?Vascular: No leg edema ?Neuro: Mild to moderate loss of vibratory sense at the fingertips bilaterally ?  ? ?Portacath/PICC-without erythema ? ?Lab Results: ? ?Lab Results  ?Component Value Date  ? WBC 3.2 (L) 07/11/2021  ? HGB 11.9 (L) 07/11/2021  ? HCT 37.1 (L) 07/11/2021  ? MCV 87.7 07/11/2021  ? PLT 94 (L) 07/11/2021  ? NEUTROABS 1.5 (L) 07/11/2021  ? ? ?CMP  ?Lab Results  ?Component Value Date  ? NA 138 06/27/2021  ? K 3.9 06/27/2021  ? CL 107 06/27/2021  ? CO2 24 06/27/2021  ? GLUCOSE 120 (H) 06/27/2021  ? BUN 18 06/27/2021  ? CREATININE 0.90 06/27/2021  ? CALCIUM 9.1 06/27/2021  ? PROT 6.4 (L) 06/27/2021  ? ALBUMIN 3.7 06/27/2021  ? AST 20 06/27/2021  ? ALT 13 06/27/2021  ? ALKPHOS 79 06/27/2021  ? BILITOT 0.4 06/27/2021  ? GFRNONAA >60 06/27/2021  ? GFRAA >60 12/01/2019  ? ? ?Lab Results  ?Component Value Date  ? CEA1 12.10 (H) 01/03/2021  ? CEA 3.07 06/27/2021  ? ? ? ?CT CHEST ABDOMEN PELVIS W CONTRAST ? ?Result Date: 07/07/2021 ?CLINICAL DATA:  Metastatic colon cancer. Restaging. * Tracking Code: BO * EXAM: CT CHEST, ABDOMEN, AND PELVIS WITH CONTRAST TECHNIQUE: Multidetector CT imaging of the chest, abdomen and pelvis was performed following the standard protocol during bolus administration of intravenous contrast. RADIATION DOSE REDUCTION: This exam was performed according to the departmental  dose-optimization program which includes automated exposure control, adjustment of the mA and/or kV according to patient size and/or use of iterative reconstruction technique. CONTRAST:  6m OMNIPAQUE IOHEXOL 300 MG/ML  SOLN COMPARISON:  05/12/2021 FINDINGS: CT CHEST FINDINGS Cardiovascular: The heart size is normal. No substantial pericardial effusion. Coronary artery calcification is evident. Mild atherosclerotic calcification is noted in the wall of the thoracic aorta. Right Port-A-Cath tip is positioned in the distal SVC. Mediastinum/Nodes: No mediastinal lymphadenopathy. There is no hilar lymphadenopathy. The esophagus has normal imaging features. There is no axillary lymphadenopathy. Lungs/Pleura: Calcified granuloma noted right middle lobe. 8 mm left upper lobe pulmonary nodule identified as enlarging previously has decreased in the interval measuring 5 mm on image 57/4. The 4 mm nodule identified as new in the posterior left upper lobe previously has resolved in the interval. No new suspicious pulmonary nodule or mass. No focal airspace consolidation. No pleural effusion. Musculoskeletal: No worrisome lytic or sclerotic osseous abnormality. Stable mild superior endplate compression deformity at T5. CT ABDOMEN PELVIS FINDINGS Hepatobiliary: No suspicious focal abnormality within the liver parenchyma. Tiny hypodensity in the dome of the liver (42/2 is stable as is the tiny hypodensity in the posterior left hepatic lobe on 47/2. Tiny calcified gallstones evident. No intrahepatic or extrahepatic biliary dilation. Pancreas: No focal mass lesion. No dilatation of the main duct. No intraparenchymal cyst. No peripancreatic edema. Spleen: No splenomegaly. No focal mass lesion. Adrenals/Urinary Tract: No  adrenal nodule or mass. 2 mm nonobstructing stone identified interpolar right kidney. No substantial change 8.8 cm cyst upper interpolar left kidney with additional smaller hypoattenuating lesions in the left kidney  also stable. No evidence for hydroureter. The urinary bladder appears normal for the degree of distention. Stomach/Bowel: Stomach is nondistended. Duodenum is normally positioned as is the ligament of Treitz. No small bowel wall thickening. No small bowel dilatation. Status post right hemicolectomy. A new area of focal wall thickening is seen in the distal transverse colon (see axial image 62 of series 2 and sagittal image 91 of series 6). This may reflect some redundant wall in the colon, but warrants attention on follow-up imaging. Sigmoid anastomosis evident. Vascular/Lymphatic: There is mild atherosclerotic calcification of the abdominal aorta without aneurysm. No gastrohepatic or hepatoduodenal ligament lymphadenopathy. The 3.9 x 2.1 cm irregular collection of soft tissue identified in the right mesentery previously is less confluent today (image 82/2) with 2 relatively discrete nodular areas visible today No pelvic sidewall lymphadenopathy. measuring 2.2 x 1.3 cm and 1.8 x 0.9 cm, respectively. Reproductive: Prostate gland is enlarged. Other: No intraperitoneal free fluid. Musculoskeletal: Small right groin hernia contains only fat. Tiny fat containing supraumbilical midline ventral hernia evident. No worrisome lytic or sclerotic osseous abnormality. IMPRESSION: 1. Continued further decrease in size of the right mesenteric nodular soft tissue lesion, now less confluent in composed of 2 smaller nodular components. 2. The lobulated 8 mm left upper lobe pulmonary nodule described as enlarging on the previous study has decreased to 5 mm in the interval. A second 4 mm nodule identified is new in the left upper lobe on the previous exam has resolved completely. 3. There is a new focal area of apparent wall thickening along the distal transverse colon, potentially related to underdistention/mucosal fold. This area warrants close attention on follow-up imaging. 4.  Aortic Atherosclerois (ICD10-170.0) Electronically  Signed   By: Misty Stanley M.D.   On: 07/07/2021 16:38   ? ?Medications: I have reviewed the patient's current medications. ? ? ?Assessment/Plan: ?Sigmoid colon cancer, T2N0, stage I, status post a left hemicolectomy on 12/06/2018; cecum/proximal ascending colon cancer stage IIIB (T3N1b), transverse colon cancer stage IIB (T4aN0) status post extended right hemicolectomy 05/07/2019, MSS ?Tumor invasive superficial portion of the muscle ("internal third "), no tumor perforation, no vascular invasion or perineural invasion, negative resection margins, 0/4 lymph nodes ?Elevated CEA 02/10/2019 ?CTs 03/05/2019, compared to outside CTs from 11/22/2018-worsened wall thickening in the cecum stable apple core lesion in the mid transverse colon, no evidence of recurrent tumor at the distal colonic anastomosis, no evidence of metastatic disease ?Colonoscopy 03/24/2019 20-2 malignant appearing masses noted in the colon, 1 filled the cecum measuring approximately 4 cm across and friable.  The other malignant appearing mass was circumferential creating a stricture with a 1.2 cm lumen located in the transverse segment approximately 4 to 5 cm in length.  There were 12-18 neoplastic appearing polyps in between the 2 obvious malignant masses ranging in size from 3 mm to 2 cm.  5 polyps distal to the transverse colon mass.  2 sigmoid colon polyps.  Cecum mass positive for adenocarcinoma.  Transverse colon mass positive for adenocarcinoma. ?Foundation 1 transverse colon-MSS, tumor mutation burden 7,KRAS wild-type, BRAF fusion ?05/07/2019 laparoscopic extended right hemicolectomy, lysis of adhesions-5 cm invasive moderately differentiated adenocarcinoma involving cecum and proximal ascending colon, carcinoma invades into the pericolonic soft tissue; 7 cm invasive moderately differentiated carcinoma involving the transverse colon, carcinoma invades into the serosal surface (  visceral peritoneum); all resection margins negative for carcinoma.   Lymphovascular invasion present.  In the proximal colon metastatic carcinoma involves 3 of 17 lymph nodes with 1 tumor deposit.  In the transverse colon 12 lymph nodes negative for metastatic carcinoma.  3 sep

## 2021-07-11 NOTE — Patient Instructions (Signed)

## 2021-07-11 NOTE — Addendum Note (Signed)
Addended by: Kelli Hope on: 07/11/2021 09:46 AM ? ? Modules accepted: Orders ? ?

## 2021-07-12 ENCOUNTER — Telehealth: Payer: Self-pay | Admitting: Pharmacy Technician

## 2021-07-12 ENCOUNTER — Telehealth: Payer: Self-pay | Admitting: Pharmacist

## 2021-07-12 ENCOUNTER — Other Ambulatory Visit (HOSPITAL_COMMUNITY): Payer: Self-pay

## 2021-07-12 DIAGNOSIS — C189 Malignant neoplasm of colon, unspecified: Secondary | ICD-10-CM

## 2021-07-12 MED ORDER — CAPECITABINE 500 MG PO TABS
1500.0000 mg | ORAL_TABLET | Freq: Two times a day (BID) | ORAL | 0 refills | Status: DC
  Filled 2021-07-12: qty 84, 14d supply, fill #0

## 2021-07-12 NOTE — Telephone Encounter (Signed)
Oral Oncology Pharmacist Encounter ? ?Received new prescription for Xeloda (capecitabine) for the maintenance treatment of colon cancer in conjunction,, planned duration until disease progression or unacceptable drug toxicity. ? ?CMP from 07/11/21 assessed, no relevant lab abnormalities. Prescription dose and frequency assessed.  ? ?Current medication list in Epic reviewed, one DDIs with capecitabine identified: ?Esomeprazole: Proton Pump Inhibitors (PPI) may diminish the therapeutic effect of capecitabine, varying information on the clinical impact. Recommend evaluating the need for a PPI/acid suppression. If acid suppression is needed, attempt switching to a H2 antagonist (eg, famotidine) if possible. ? ?Evaluated chart and no patient barriers to medication adherence identified.  ? ?Prescription has been e-scribed to the [facility] for benefits analysis and approval. ? ?Oral Oncology Clinic will continue to follow for insurance authorization, copayment issues, initial counseling and start date. ? ?Patient agreed to treatment on 07/11/21 per MD documentation. ? ?Darl Pikes, PharmD, BCPS, BCOP, CPP ?Hematology/Oncology Clinical Pharmacist Practitioner ?/[facility] Oral Chemotherapy Navigation Clinic ?[phone] ? ?07/12/2021 10:46 AM ? ?

## 2021-07-12 NOTE — Telephone Encounter (Signed)
Oral Oncology Patient Advocate Encounter ?  ?Received notification from [facility] that prior authorization for Capecitabine is required. ?  ?PA submitted by phone [phone]) ?Case # [id number] ?Faxed last office note to [phone] ?Status is pending ?  ?Oral Oncology Clinic will continue to follow. ? ?Dennison Nancy CPHT ?Specialty Pharmacy Patient Advocate ?[place] ?Phone [phone] ?Fax [phone] ?07/12/2021 9:58 AM ? ?

## 2021-07-13 ENCOUNTER — Other Ambulatory Visit (HOSPITAL_COMMUNITY): Payer: Self-pay

## 2021-07-13 ENCOUNTER — Inpatient Hospital Stay: Payer: 59

## 2021-07-13 ENCOUNTER — Encounter: Payer: Self-pay | Admitting: Oncology

## 2021-07-13 MED ORDER — CAPECITABINE 500 MG PO TABS
1500.0000 mg | ORAL_TABLET | Freq: Two times a day (BID) | ORAL | 0 refills | Status: DC
  Filled 2021-07-13: qty 84, 14d supply, fill #0

## 2021-07-13 NOTE — Telephone Encounter (Addendum)
Oral Oncology Patient Advocate Encounter ? ?Prior Authorization for Capecitabine has been approved.   ? ?PA# [id number] ?Effective dates: 07/12/21 through 07/13/22 ? ?Patient must use Insurance risk surveyor. ? ?Oral Oncology Clinic will continue to follow.  ? ?Dennison Nancy CPHT ?Specialty Pharmacy Patient Advocate ?[place] ?Phone [phone] ?Fax [phone] ?07/13/2021 9:47 AM ? ?

## 2021-07-13 NOTE — Telephone Encounter (Addendum)
Oral Chemotherapy Pharmacist Encounter  ?Due to insurance restriction the medication could not be filled at [facility]. Prescription has been e-scribed to [facility]. ? ?Supportive information was faxed to [facility]. We will continue to follow medication access.  ? ? ?Darl Pikes, PharmD, BCPS, BCOP ?Hematology/Oncology Clinical Pharmacist ?[facility]/AP Oral Chemotherapy Navigation Clinic ?[phone] ? ?07/13/2021 11:14 AM  ?

## 2021-07-14 ENCOUNTER — Other Ambulatory Visit (HOSPITAL_COMMUNITY): Payer: Self-pay

## 2021-07-14 MED ORDER — CAPECITABINE 500 MG PO TABS: 1500.0000 mg | ORAL_TABLET | Freq: Two times a day (BID) | ORAL | 0 refills | Status: DC

## 2021-07-14 NOTE — Telephone Encounter (Signed)
Oral Chemotherapy Pharmacist Encounter ? ?Capecitabine pending processing by [facility]. [name] will follow-up with the pharmacy ? ?Patient Education ?I spoke with patient for overview of new oral chemotherapy medication: Xeloda (capecitabine) for the maintenance treatment of colon cancer in conjunction,, planned duration until disease progression or unacceptable drug toxicity.  ? ?Pt is doing well. Counseled patient on administration, dosing, side effects, monitoring, drug-food interactions, safe handling, storage, and disposal. ?Patient will take 3 tablets (1,500 mg total) by mouth 2 (two) times daily after a meal. Take for 14 days, then hold for 7 days. ? ?Side effects include but not limited to: diarrhea, hand-foot syndrome, mouth sores, edema, decreased wbc, fatigue, N/V ?Diarrhea: currently regular bowel movements, has loperamide on hand ?Hand-foot syndrome: recommended use of [place] Smooth Extra Care 20 ?Mouth sores: He knows to call to call for magic mouthwash if needed. ? ?Reviewed with patient importance of keeping a medication schedule and plan for any missed doses. ? ?After discussion with patient no patient barriers to medication adherence identified.  ? ?John Vance voiced understanding and appreciation. All questions answered. Medication handout provided. ? ?Provided patient with [facility] phone number. Patient knows to call the office with questions or concerns. Oral Chemotherapy Navigation Clinic will continue to follow. ? ?Darl Pikes, PharmD, BCPS, BCOP, CPP ?Hematology/Oncology Clinical Pharmacist Practitioner ?[place]/[facility] Oral Chemotherapy Navigation Clinic ?[phone] ? ?07/14/2021 3:41 PM ?

## 2021-07-17 ENCOUNTER — Other Ambulatory Visit: Payer: Self-pay | Admitting: Oncology

## 2021-07-17 DIAGNOSIS — C189 Malignant neoplasm of colon, unspecified: Secondary | ICD-10-CM

## 2021-07-18 ENCOUNTER — Encounter: Payer: Self-pay | Admitting: Oncology

## 2021-08-04 ENCOUNTER — Inpatient Hospital Stay: Payer: 59

## 2021-08-04 ENCOUNTER — Other Ambulatory Visit: Payer: Self-pay | Admitting: *Deleted

## 2021-08-04 ENCOUNTER — Inpatient Hospital Stay: Payer: 59 | Admitting: Oncology

## 2021-08-04 ENCOUNTER — Inpatient Hospital Stay: Payer: 59 | Attending: Oncology

## 2021-08-04 VITALS — BP 140/58 | HR 75 | Temp 98.1°F | Resp 18 | Ht 68.0 in | Wt 178.2 lb

## 2021-08-04 DIAGNOSIS — C182 Malignant neoplasm of ascending colon: Secondary | ICD-10-CM | POA: Insufficient documentation

## 2021-08-04 DIAGNOSIS — Z95828 Presence of other vascular implants and grafts: Secondary | ICD-10-CM

## 2021-08-04 DIAGNOSIS — C187 Malignant neoplasm of sigmoid colon: Secondary | ICD-10-CM | POA: Insufficient documentation

## 2021-08-04 DIAGNOSIS — C189 Malignant neoplasm of colon, unspecified: Secondary | ICD-10-CM | POA: Diagnosis not present

## 2021-08-04 DIAGNOSIS — C180 Malignant neoplasm of cecum: Secondary | ICD-10-CM | POA: Insufficient documentation

## 2021-08-04 DIAGNOSIS — G629 Polyneuropathy, unspecified: Secondary | ICD-10-CM | POA: Insufficient documentation

## 2021-08-04 LAB — CBC WITH DIFFERENTIAL (CANCER CENTER ONLY)
Abs Immature Granulocytes: 0.01 10*3/uL (ref 0.00–0.07)
Basophils Absolute: 0 10*3/uL (ref 0.0–0.1)
Basophils Relative: 0 %
Eosinophils Absolute: 0.1 10*3/uL (ref 0.0–0.5)
Eosinophils Relative: 2 %
HCT: 34.4 % — ABNORMAL LOW (ref 39.0–52.0)
Hemoglobin: 11.3 g/dL — ABNORMAL LOW (ref 13.0–17.0)
Immature Granulocytes: 0 %
Lymphocytes Relative: 23 %
Lymphs Abs: 1.3 10*3/uL (ref 0.7–4.0)
MCH: 28.9 pg (ref 26.0–34.0)
MCHC: 32.8 g/dL (ref 30.0–36.0)
MCV: 88 fL (ref 80.0–100.0)
Monocytes Absolute: 0.8 10*3/uL (ref 0.1–1.0)
Monocytes Relative: 15 %
Neutro Abs: 3.2 10*3/uL (ref 1.7–7.7)
Neutrophils Relative %: 60 %
Platelet Count: 146 10*3/uL — ABNORMAL LOW (ref 150–400)
RBC: 3.91 MIL/uL — ABNORMAL LOW (ref 4.22–5.81)
RDW: 18.4 % — ABNORMAL HIGH (ref 11.5–15.5)
WBC Count: 5.4 10*3/uL (ref 4.0–10.5)
nRBC: 0 % (ref 0.0–0.2)

## 2021-08-04 LAB — CMP (CANCER CENTER ONLY)
ALT: 10 U/L (ref 0–44)
AST: 17 U/L (ref 15–41)
Albumin: 3.7 g/dL (ref 3.5–5.0)
Alkaline Phosphatase: 67 U/L (ref 38–126)
Anion gap: 8 (ref 5–15)
BUN: 17 mg/dL (ref 8–23)
CO2: 24 mmol/L (ref 22–32)
Calcium: 8.8 mg/dL — ABNORMAL LOW (ref 8.9–10.3)
Chloride: 107 mmol/L (ref 98–111)
Creatinine: 0.91 mg/dL (ref 0.61–1.24)
GFR, Estimated: >60 mL/min (ref >60)
Glucose, Bld: 123 mg/dL — ABNORMAL HIGH (ref 70–99)
Potassium: 3.8 mmol/L (ref 3.5–5.1)
Sodium: 139 mmol/L (ref 135–145)
Total Bilirubin: 0.5 mg/dL (ref 0.3–1.2)
Total Protein: 6.1 g/dL — ABNORMAL LOW (ref 6.5–8.1)

## 2021-08-04 LAB — CEA (ACCESS): CEA (CHCC): 4.28 ng/mL (ref 0.00–5.00)

## 2021-08-04 MED ORDER — CAPECITABINE 500 MG PO TABS: 1500.0000 mg | ORAL_TABLET | Freq: Two times a day (BID) | ORAL | 0 refills | Status: DC

## 2021-08-04 MED ORDER — HEPARIN SOD (PORK) LOCK FLUSH 100 UNIT/ML IV SOLN
500.0000 [IU] | Freq: Once | INTRAVENOUS | Status: AC
  Administered 2021-08-04: 500 [IU] via INTRAVENOUS

## 2021-08-04 MED ORDER — SODIUM CHLORIDE 0.9% FLUSH
10.0000 mL | Freq: Once | INTRAVENOUS | Status: AC
  Administered 2021-08-04: 10 mL via INTRAVENOUS

## 2021-08-04 NOTE — Progress Notes (Signed)
Patient presents to MD visit today with son, John Vance and interpreter, Alba w/[employer]. ?

## 2021-08-04 NOTE — Patient Instructions (Signed)

## 2021-08-04 NOTE — Progress Notes (Signed)
Interpreter John Vance present during patient visit.  ?

## 2021-08-04 NOTE — Progress Notes (Signed)
?[place] ?OFFICE PROGRESS NOTE ? ? ?Diagnosis: Colon cancer ? ?INTERVAL HISTORY:  ? ?[name] returns as scheduled.  He completed a cycle of capecitabine beginning 07/18/2021.  No mouth sores, diarrhea, or hand/foot pain.  Peripheral neuropathy symptoms are partially improved.  No new complaint. ? ?Objective: ? ?Vital signs in last 24 hours: ? ?Blood pressure (!) 140/58, pulse 75, temperature 98.1 ?F (36.7 ?C), temperature source Oral, resp. rate 18, height 5' 8" (1.727 m), weight 178 lb 3.2 oz (80.8 kg), SpO2 98 %. ?  ? ?HEENT: No thrush or ulcers ?Resp: Lungs clear bilaterally ?Cardio: Regular rate and rhythm ?GI: No hepatosplenomegaly, nontender, no mass ?Vascular: No leg edema  ?Skin: Palms without erythema ? ?Portacath/PICC-without erythema ? ?Lab Results: ? ?Lab Results  ?Component Value Date  ? WBC 5.4 08/04/2021  ? HGB 11.3 (L) 08/04/2021  ? HCT 34.4 (L) 08/04/2021  ? MCV 88.0 08/04/2021  ? PLT 146 (L) 08/04/2021  ? NEUTROABS 3.2 08/04/2021  ? ? ?CMP  ?Lab Results  ?Component Value Date  ? NA 139 07/11/2021  ? K 3.8 07/11/2021  ? CL 107 07/11/2021  ? CO2 25 07/11/2021  ? GLUCOSE 131 (H) 07/11/2021  ? BUN 17 07/11/2021  ? CREATININE 0.96 07/11/2021  ? CALCIUM 9.0 07/11/2021  ? PROT 6.5 07/11/2021  ? ALBUMIN 3.6 07/11/2021  ? AST 21 07/11/2021  ? ALT 13 07/11/2021  ? ALKPHOS 88 07/11/2021  ? BILITOT 0.4 07/11/2021  ? GFRNONAA >60 07/11/2021  ? GFRAA >60 12/01/2019  ? ? ?Lab Results  ?Component Value Date  ? CEA1 12.10 (H) 01/03/2021  ? CEA 3.07 06/27/2021  ? ? ? ?Medications: I have reviewed the patient's current medications. ? ? ?Assessment/Plan: ?Sigmoid colon cancer, T2N0, stage I, status post a left hemicolectomy on 12/06/2018; cecum/proximal ascending colon cancer stage IIIB (T3N1b), transverse colon cancer stage IIB (T4aN0) status post extended right hemicolectomy 05/07/2019, MSS ?Tumor invasive superficial portion of the muscle ("internal third "), no tumor perforation, no vascular  invasion or perineural invasion, negative resection margins, 0/4 lymph nodes ?Elevated CEA 02/10/2019 ?CTs 03/05/2019, compared to outside CTs from 11/22/2018-worsened wall thickening in the cecum stable apple core lesion in the mid transverse colon, no evidence of recurrent tumor at the distal colonic anastomosis, no evidence of metastatic disease ?Colonoscopy 03/24/2019 20-2 malignant appearing masses noted in the colon, 1 filled the cecum measuring approximately 4 cm across and friable.  The other malignant appearing mass was circumferential creating a stricture with a 1.2 cm lumen located in the transverse segment approximately 4 to 5 cm in length.  There were 12-18 neoplastic appearing polyps in between the 2 obvious malignant masses ranging in size from 3 mm to 2 cm.  5 polyps distal to the transverse colon mass.  2 sigmoid colon polyps.  Cecum mass positive for adenocarcinoma.  Transverse colon mass positive for adenocarcinoma. ?Foundation 1 transverse colon-MSS, tumor mutation burden 7,KRAS wild-type, BRAF fusion ?05/07/2019 laparoscopic extended right hemicolectomy, lysis of adhesions-5 cm invasive moderately differentiated adenocarcinoma involving cecum and proximal ascending colon, carcinoma invades into the pericolonic soft tissue; 7 cm invasive moderately differentiated carcinoma involving the transverse colon, carcinoma invades into the serosal surface (visceral peritoneum); all resection margins negative for carcinoma.  Lymphovascular invasion present.  In the proximal colon metastatic carcinoma involves 3 of 17 lymph nodes with 1 tumor deposit.  In the transverse colon 12 lymph nodes negative for metastatic carcinoma.  3 separate polyps: Tubular adenoma, inflammatory polyp and hyperplastic polyp ?  Cycle 1 capecitabine 06/02/2019 ?Cycle 2 capecitabine 06/23/2019 ?Cycle 3 capecitabine 07/14/2019 ?Cycle 4 capecitabine 08/04/2019 ?Cycle 5 capecitabine 08/25/2019 ?Cycle 6 capecitabine 09/15/2019 ?Cycle 7 capecitabine  10/06/2019 ?Cycle 8 capecitabine 10/27/2019 ?Upper endoscopy 07/23/2020-negative ?Colonoscopy 07/23/2020-normal-appearing anastomoses, polyp removed from the descending colon-tubular adenoma ?CTs 01/18/2021-right abdominal mesenteric mass, mass in the low anatomic pelvis consistent with metastases ?Cycle 1 FOLFOX 02/21/2021 ?Cycle 2 FOLFOX 03/07/2021, dose reductions made due to mild neutropenia ?Cycle 3 FOLFOX 03/21/2021 ?Cycle 4 FOLFOX 04/04/2021 ?Cycle 5 FOLFOX 04/19/2021 ?Cycle 6 FOLFOX 05/02/2021 ?CTs 05/12/2021-decrease size of nodular right mesenteric mass, complete resolution of soft tissue enhancing nodule in the right inguinal canal, resolution of nodular soft tissue mass posterior to the bladder, enlargement of a left upper lobe nodule and new 4 mm left upper lobe nodule-indeterminate ?Cycle 7 FOLFOX 05/16/2021 ?Cycle 8 FOLFOX 05/30/2021, oxaliplatin and 5-FU bolus held due to neuropathy and thrombocytopenia ?Cycle 9 FOLFOX 06/13/2021, oxaliplatin dose reduced ?Cycle 10 FOLFOX 06/27/2021 ?CT 07/07/2021-decrease in right mesenteric mass, decreased size of lobulated left upper lobe nodule, resolution of previous "new "left upper lobe nodule, new area of wall thickening at the distal transverse colon ?Treatment changed to Xeloda 2 weeks on/1 week off beginning 07/18/2021 ?2.   Microcytic anemia secondary to #1.    Resolved ?3.   Family history of pancreas and colon cancer, negative genetic testing per the [facility] panel, ATM variant of unknown significance ?4.   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  ? ? ? ? ?Disposition: ?[name] tolerated the first cycle of capecitabine well.  He will complete cycle 2 beginning 08/08/2021.  He will return for an office and lab visit in 3 weeks. ? ?Betsy Coder, MD ? ?08/04/2021  ?8:38 AM ? ? ?

## 2021-08-08 ENCOUNTER — Other Ambulatory Visit: Payer: Self-pay | Admitting: Oncology

## 2021-08-08 DIAGNOSIS — C189 Malignant neoplasm of colon, unspecified: Secondary | ICD-10-CM

## 2021-08-25 ENCOUNTER — Other Ambulatory Visit: Payer: Self-pay | Admitting: Oncology

## 2021-08-25 ENCOUNTER — Inpatient Hospital Stay: Payer: 59 | Attending: Oncology

## 2021-08-25 ENCOUNTER — Inpatient Hospital Stay: Payer: 59 | Admitting: Nurse Practitioner

## 2021-08-25 ENCOUNTER — Inpatient Hospital Stay: Payer: 59

## 2021-08-25 ENCOUNTER — Encounter: Payer: Self-pay | Admitting: Nurse Practitioner

## 2021-08-25 VITALS — BP 141/64 | HR 74 | Temp 98.2°F | Resp 18 | Ht 68.0 in | Wt 172.8 lb

## 2021-08-25 DIAGNOSIS — C189 Malignant neoplasm of colon, unspecified: Secondary | ICD-10-CM

## 2021-08-25 DIAGNOSIS — C187 Malignant neoplasm of sigmoid colon: Secondary | ICD-10-CM | POA: Insufficient documentation

## 2021-08-25 DIAGNOSIS — D509 Iron deficiency anemia, unspecified: Secondary | ICD-10-CM | POA: Diagnosis not present

## 2021-08-25 DIAGNOSIS — Z95828 Presence of other vascular implants and grafts: Secondary | ICD-10-CM

## 2021-08-25 DIAGNOSIS — C7989 Secondary malignant neoplasm of other specified sites: Secondary | ICD-10-CM | POA: Insufficient documentation

## 2021-08-25 DIAGNOSIS — D696 Thrombocytopenia, unspecified: Secondary | ICD-10-CM | POA: Insufficient documentation

## 2021-08-25 DIAGNOSIS — C180 Malignant neoplasm of cecum: Secondary | ICD-10-CM | POA: Insufficient documentation

## 2021-08-25 DIAGNOSIS — C182 Malignant neoplasm of ascending colon: Secondary | ICD-10-CM | POA: Insufficient documentation

## 2021-08-25 DIAGNOSIS — G620 Drug-induced polyneuropathy: Secondary | ICD-10-CM | POA: Insufficient documentation

## 2021-08-25 LAB — CBC WITH DIFFERENTIAL (CANCER CENTER ONLY)
Abs Immature Granulocytes: 0.02 10*3/uL (ref 0.00–0.07)
Basophils Absolute: 0 10*3/uL (ref 0.0–0.1)
Basophils Relative: 0 %
Eosinophils Absolute: 0.1 10*3/uL (ref 0.0–0.5)
Eosinophils Relative: 3 %
HCT: 36.4 % — ABNORMAL LOW (ref 39.0–52.0)
Hemoglobin: 12 g/dL — ABNORMAL LOW (ref 13.0–17.0)
Immature Granulocytes: 0 %
Lymphocytes Relative: 23 %
Lymphs Abs: 1.2 10*3/uL (ref 0.7–4.0)
MCH: 30.2 pg (ref 26.0–34.0)
MCHC: 33 g/dL (ref 30.0–36.0)
MCV: 91.5 fL (ref 80.0–100.0)
Monocytes Absolute: 0.8 10*3/uL (ref 0.1–1.0)
Monocytes Relative: 15 %
Neutro Abs: 3 10*3/uL (ref 1.7–7.7)
Neutrophils Relative %: 59 %
Platelet Count: 137 10*3/uL — ABNORMAL LOW (ref 150–400)
RBC: 3.98 MIL/uL — ABNORMAL LOW (ref 4.22–5.81)
RDW: 19.9 % — ABNORMAL HIGH (ref 11.5–15.5)
WBC Count: 5.1 10*3/uL (ref 4.0–10.5)
nRBC: 0 % (ref 0.0–0.2)

## 2021-08-25 LAB — CMP (CANCER CENTER ONLY)
ALT: 13 U/L (ref 0–44)
AST: 21 U/L (ref 15–41)
Albumin: 3.9 g/dL (ref 3.5–5.0)
Alkaline Phosphatase: 78 U/L (ref 38–126)
Anion gap: 7 (ref 5–15)
BUN: 20 mg/dL (ref 8–23)
CO2: 25 mmol/L (ref 22–32)
Calcium: 9.1 mg/dL (ref 8.9–10.3)
Chloride: 107 mmol/L (ref 98–111)
Creatinine: 1.02 mg/dL (ref 0.61–1.24)
GFR, Estimated: >60 mL/min
Glucose, Bld: 117 mg/dL — ABNORMAL HIGH (ref 70–99)
Potassium: 3.9 mmol/L (ref 3.5–5.1)
Sodium: 139 mmol/L (ref 135–145)
Total Bilirubin: 0.8 mg/dL (ref 0.3–1.2)
Total Protein: 6.4 g/dL — ABNORMAL LOW (ref 6.5–8.1)

## 2021-08-25 LAB — CEA (ACCESS): CEA (CHCC): 3.57 ng/mL (ref 0.00–5.00)

## 2021-08-25 MED ORDER — HEPARIN SOD (PORK) LOCK FLUSH 100 UNIT/ML IV SOLN
500.0000 [IU] | Freq: Once | INTRAVENOUS | Status: AC
  Administered 2021-08-25: 500 [IU] via INTRAVENOUS

## 2021-08-25 MED ORDER — CAPECITABINE 500 MG PO TABS: 1500.0000 mg | ORAL_TABLET | Freq: Two times a day (BID) | ORAL | 0 refills | Status: DC

## 2021-08-25 MED ORDER — SODIUM CHLORIDE 0.9% FLUSH
10.0000 mL | Freq: Once | INTRAVENOUS | Status: AC
  Administered 2021-08-25: 10 mL via INTRAVENOUS

## 2021-08-25 NOTE — Progress Notes (Signed)
Office visit today w/assistance of [place] interpreter, Wynelle Bourgeois. ?

## 2021-08-25 NOTE — Progress Notes (Signed)
?[place] ?OFFICE PROGRESS NOTE ? ? ?Diagnosis: Colon cancer ? ?INTERVAL HISTORY:  ? ?John Vance returns as scheduled.  He completed cycle 2 capecitabine beginning 08/09/2021.  He denies nausea/vomiting.  No mouth sores.  No diarrhea.  He estimates 2 soft stools a day.  No hand or foot pain or redness.  He continues to note numbness and cold sensitivity in the fingertips. ? ?Objective: ? ?Vital signs in last 24 hours: ? ?Blood pressure (!) 141/64, pulse 74, temperature 98.2 ?F (36.8 ?C), temperature source Oral, resp. rate 18, height 5' 8"  (1.727 m), weight 172 lb 12.8 oz (78.4 kg), SpO2 100 %. ?  ? ?HEENT: No thrush or ulcers. ?Resp: Lungs clear bilaterally. ?Cardio: Regular rate and rhythm. ?GI: Abdomen soft and nontender.  No hepatomegaly. ?Vascular: No leg edema. ?Skin: Palms and soles with hyperpigmentation, no erythema.  No skin breakdown. ?Port-A-Cath without erythema. ? ? ?Lab Results: ? ?Lab Results  ?Component Value Date  ? WBC 5.1 08/25/2021  ? HGB 12.0 (L) 08/25/2021  ? HCT 36.4 (L) 08/25/2021  ? MCV 91.5 08/25/2021  ? PLT 137 (L) 08/25/2021  ? NEUTROABS 3.0 08/25/2021  ? ? ?Imaging: ? ?No results found. ? ?Medications: I have reviewed the patient's current medications. ? ?Assessment/Plan: ?Sigmoid colon cancer, T2N0, stage I, status post a left hemicolectomy on 12/06/2018; cecum/proximal ascending colon cancer stage IIIB (T3N1b), transverse colon cancer stage IIB (T4aN0) status post extended right hemicolectomy 05/07/2019, MSS ?Tumor invasive superficial portion of the muscle ("internal third "), no tumor perforation, no vascular invasion or perineural invasion, negative resection margins, 0/4 lymph nodes ?Elevated CEA 02/10/2019 ?CTs 03/05/2019, compared to outside CTs from 11/22/2018-worsened wall thickening in the cecum stable apple core lesion in the mid transverse colon, no evidence of recurrent tumor at the distal colonic anastomosis, no evidence of metastatic disease ?Colonoscopy  03/24/2019 20-2 malignant appearing masses noted in the colon, 1 filled the cecum measuring approximately 4 cm across and friable.  The other malignant appearing mass was circumferential creating a stricture with a 1.2 cm lumen located in the transverse segment approximately 4 to 5 cm in length.  There were 12-18 neoplastic appearing polyps in between the 2 obvious malignant masses ranging in size from 3 mm to 2 cm.  5 polyps distal to the transverse colon mass.  2 sigmoid colon polyps.  Cecum mass positive for adenocarcinoma.  Transverse colon mass positive for adenocarcinoma. ?Foundation 1 transverse colon-MSS, tumor mutation burden 7,KRAS wild-type, BRAF fusion ?05/07/2019 laparoscopic extended right hemicolectomy, lysis of adhesions-5 cm invasive moderately differentiated adenocarcinoma involving cecum and proximal ascending colon, carcinoma invades into the pericolonic soft tissue; 7 cm invasive moderately differentiated carcinoma involving the transverse colon, carcinoma invades into the serosal surface (visceral peritoneum); all resection margins negative for carcinoma.  Lymphovascular invasion present.  In the proximal colon metastatic carcinoma involves 3 of 17 lymph nodes with 1 tumor deposit.  In the transverse colon 12 lymph nodes negative for metastatic carcinoma.  3 separate polyps: Tubular adenoma, inflammatory polyp and hyperplastic polyp ?Cycle 1 capecitabine 06/02/2019 ?Cycle 2 capecitabine 06/23/2019 ?Cycle 3 capecitabine 07/14/2019 ?Cycle 4 capecitabine 08/04/2019 ?Cycle 5 capecitabine 08/25/2019 ?Cycle 6 capecitabine 09/15/2019 ?Cycle 7 capecitabine 10/06/2019 ?Cycle 8 capecitabine 10/27/2019 ?Upper endoscopy 07/23/2020-negative ?Colonoscopy 07/23/2020-normal-appearing anastomoses, polyp removed from the descending colon-tubular adenoma ?CTs 01/18/2021-right abdominal mesenteric mass, mass in the low anatomic pelvis consistent with metastases ?Cycle 1 FOLFOX 02/21/2021 ?Cycle 2 FOLFOX 03/07/2021, dose reductions  made due to mild neutropenia ?Cycle 3 FOLFOX 03/21/2021 ?  Cycle 4 FOLFOX 04/04/2021 ?Cycle 5 FOLFOX 04/19/2021 ?Cycle 6 FOLFOX 05/02/2021 ?CTs 05/12/2021-decrease size of nodular right mesenteric mass, complete resolution of soft tissue enhancing nodule in the right inguinal canal, resolution of nodular soft tissue mass posterior to the bladder, enlargement of a left upper lobe nodule and new 4 mm left upper lobe nodule-indeterminate ?Cycle 7 FOLFOX 05/16/2021 ?Cycle 8 FOLFOX 05/30/2021, oxaliplatin and 5-FU bolus held due to neuropathy and thrombocytopenia ?Cycle 9 FOLFOX 06/13/2021, oxaliplatin dose reduced ?Cycle 10 FOLFOX 06/27/2021 ?CT 07/07/2021-decrease in right mesenteric mass, decreased size of lobulated left upper lobe nodule, resolution of previous "new "left upper lobe nodule, new area of wall thickening at the distal transverse colon ?Treatment changed to Xeloda 2 weeks on/1 week off beginning 07/18/2021 ?Cycle 2 Xeloda beginning 08/09/2021 ?Cycle 3 Xeloda beginning 08/30/2021 ?2.   Microcytic anemia secondary to #1.    Resolved ?3.   Family history of pancreas and colon cancer, negative genetic testing per the [facility] panel, ATM variant of unknown significance ?4.   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  ? ?Disposition: John Vance appears stable.  He has completed 2 cycles of Xeloda, 14 days on/7 days off.  He is tolerating well.  Plan to proceed with cycle 3 beginning 08/30/2021. ? ?CBC from today reviewed.  Counts adequate to proceed with Xeloda as above. ? ?He will return for lab and follow-up in 3 weeks.  We are available to see him sooner as needed. ? ? ? ? ? ?Ned Card ANP/GNP-BC  ? ?08/25/2021  ?8:46 AM ? ? ? ? ? ? ? ?

## 2021-08-26 ENCOUNTER — Encounter: Payer: Self-pay | Admitting: Oncology

## 2021-08-29 ENCOUNTER — Telehealth: Payer: Self-pay

## 2021-08-29 NOTE — Telephone Encounter (Signed)
-----   Message from Owens Shark, NP sent at 08/29/2021  8:49 AM EDT ----- ?Please let his son know that Dr. Benay Spice has not spoken with Dr. Dema Severin recently.  His case will be put on for conference in the next few weeks. ? ?

## 2021-08-29 NOTE — Telephone Encounter (Signed)
Patient son verbal understanded and had no further questions or concerns at this time ?

## 2021-09-01 ENCOUNTER — Other Ambulatory Visit: Payer: Self-pay | Admitting: Oncology

## 2021-09-01 DIAGNOSIS — C189 Malignant neoplasm of colon, unspecified: Secondary | ICD-10-CM

## 2021-09-01 NOTE — Telephone Encounter (Signed)
Too early. Just started last cycle on 5/9 ?

## 2021-09-07 ENCOUNTER — Other Ambulatory Visit: Payer: Self-pay

## 2021-09-07 NOTE — Progress Notes (Signed)
The proposed treatment discussed in conference is for discussion purpose only and is not a binding recommendation.  The patients have not been physically examined, or presented with their treatment options.  Therefore, final treatment plans cannot be decided.  

## 2021-09-15 ENCOUNTER — Inpatient Hospital Stay: Payer: 59

## 2021-09-15 ENCOUNTER — Inpatient Hospital Stay (HOSPITAL_BASED_OUTPATIENT_CLINIC_OR_DEPARTMENT_OTHER): Payer: 59 | Admitting: Oncology

## 2021-09-15 VITALS — BP 145/55 | HR 73 | Temp 98.2°F | Resp 20 | Ht 68.0 in | Wt 173.2 lb

## 2021-09-15 DIAGNOSIS — C187 Malignant neoplasm of sigmoid colon: Secondary | ICD-10-CM | POA: Diagnosis not present

## 2021-09-15 DIAGNOSIS — C189 Malignant neoplasm of colon, unspecified: Secondary | ICD-10-CM

## 2021-09-15 DIAGNOSIS — Z95828 Presence of other vascular implants and grafts: Secondary | ICD-10-CM

## 2021-09-15 LAB — CMP (CANCER CENTER ONLY)
ALT: 11 U/L (ref 0–44)
AST: 19 U/L (ref 15–41)
Albumin: 3.8 g/dL (ref 3.5–5.0)
Alkaline Phosphatase: 81 U/L (ref 38–126)
Anion gap: 7 (ref 5–15)
BUN: 17 mg/dL (ref 8–23)
CO2: 25 mmol/L (ref 22–32)
Calcium: 9.2 mg/dL (ref 8.9–10.3)
Chloride: 107 mmol/L (ref 98–111)
Creatinine: 1.04 mg/dL (ref 0.61–1.24)
GFR, Estimated: >60 mL/min (ref >60)
Glucose, Bld: 125 mg/dL — ABNORMAL HIGH (ref 70–99)
Potassium: 4 mmol/L (ref 3.5–5.1)
Sodium: 139 mmol/L (ref 135–145)
Total Bilirubin: 0.7 mg/dL (ref 0.3–1.2)
Total Protein: 6.3 g/dL — ABNORMAL LOW (ref 6.5–8.1)

## 2021-09-15 LAB — CBC WITH DIFFERENTIAL (CANCER CENTER ONLY)
Abs Immature Granulocytes: 0.02 10*3/uL (ref 0.00–0.07)
Basophils Absolute: 0 10*3/uL (ref 0.0–0.1)
Basophils Relative: 0 %
Eosinophils Absolute: 0.2 10*3/uL (ref 0.0–0.5)
Eosinophils Relative: 3 %
HCT: 35.6 % — ABNORMAL LOW (ref 39.0–52.0)
Hemoglobin: 12.2 g/dL — ABNORMAL LOW (ref 13.0–17.0)
Immature Granulocytes: 0 %
Lymphocytes Relative: 23 %
Lymphs Abs: 1.4 10*3/uL (ref 0.7–4.0)
MCH: 32.2 pg (ref 26.0–34.0)
MCHC: 34.3 g/dL (ref 30.0–36.0)
MCV: 93.9 fL (ref 80.0–100.0)
Monocytes Absolute: 0.9 10*3/uL (ref 0.1–1.0)
Monocytes Relative: 15 %
Neutro Abs: 3.4 10*3/uL (ref 1.7–7.7)
Neutrophils Relative %: 59 %
Platelet Count: 143 10*3/uL — ABNORMAL LOW (ref 150–400)
RBC: 3.79 MIL/uL — ABNORMAL LOW (ref 4.22–5.81)
RDW: 20.4 % — ABNORMAL HIGH (ref 11.5–15.5)
WBC Count: 5.9 10*3/uL (ref 4.0–10.5)
nRBC: 0 % (ref 0.0–0.2)

## 2021-09-15 LAB — CEA (ACCESS): CEA (CHCC): 3.9 ng/mL (ref 0.00–5.00)

## 2021-09-15 MED ORDER — CAPECITABINE 500 MG PO TABS: 1500.0000 mg | ORAL_TABLET | Freq: Two times a day (BID) | ORAL | 1 refills | Status: DC

## 2021-09-15 MED ORDER — HEPARIN SOD (PORK) LOCK FLUSH 100 UNIT/ML IV SOLN
500.0000 [IU] | Freq: Once | INTRAVENOUS | Status: AC
  Administered 2021-09-15: 500 [IU] via INTRAVENOUS

## 2021-09-15 MED ORDER — SODIUM CHLORIDE 0.9% FLUSH
10.0000 mL | Freq: Once | INTRAVENOUS | Status: AC
  Administered 2021-09-15: 10 mL via INTRAVENOUS

## 2021-09-15 NOTE — Progress Notes (Signed)
[place] OFFICE PROGRESS NOTE   Diagnosis: Colon cancer  INTERVAL HISTORY:   John. Altamease Vance returns as scheduled.  He completed another cycle of Xeloda beginning 08/30/2021.  No diarrhea.  No hand or foot pain.  He continues to have numbness in the hands and feet.  Objective:  Vital signs in last 24 hours:  Blood pressure (!) 145/55, pulse 73, temperature 98.2 F (36.8 C), temperature source Oral, resp. rate 20, height 5' 8"  (1.727 m), weight 173 lb 3.2 oz (78.6 kg), SpO2 100 %.    HEENT: No thrush or ulcers Resp: Lungs clear bilaterally Cardio: Regular rate and rhythm GI: No hepatosplenomegaly Vascular: No leg edema  Skin: Dryness and hyperpigmentation of the palms and soles, few areas of superficial cracking at the palms  Portacath/PICC-without erythema  Lab Results:  Lab Results  Component Value Date   WBC 5.9 09/15/2021   HGB 12.2 (L) 09/15/2021   HCT 35.6 (L) 09/15/2021   MCV 93.9 09/15/2021   PLT 143 (L) 09/15/2021   NEUTROABS 3.4 09/15/2021    CMP  Lab Results  Component Value Date   NA 139 09/15/2021   K 4.0 09/15/2021   CL 107 09/15/2021   CO2 25 09/15/2021   GLUCOSE 125 (H) 09/15/2021   BUN 17 09/15/2021   CREATININE 1.04 09/15/2021   CALCIUM 9.2 09/15/2021   PROT 6.3 (L) 09/15/2021   ALBUMIN 3.8 09/15/2021   AST 19 09/15/2021   ALT 11 09/15/2021   ALKPHOS 81 09/15/2021   BILITOT 0.7 09/15/2021   GFRNONAA >60 09/15/2021   GFRAA >60 12/01/2019    Lab Results  Component Value Date   CEA1 12.10 (H) 01/03/2021   CEA 3.57 08/25/2021    Medications: I have reviewed the patient's current medications.   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), transverse colon cancer stage IIB (T4aN0) status post extended right hemicolectomy 05/07/2019, MSS Tumor invasive superficial portion of the muscle ("internal third "), no tumor  perforation, no vascular invasion or perineural invasion, negative resection margins, 0/4 lymph nodes Elevated CEA 02/10/2019 CTs 03/05/2019, compared to outside CTs from 11/22/2018-worsened wall thickening in the cecum stable apple core lesion in the mid transverse colon, no evidence of recurrent tumor at the distal colonic anastomosis, no evidence of metastatic disease Colonoscopy 03/24/2019 20-2 malignant appearing masses noted in the colon, 1 filled the cecum measuring approximately 4 cm across and friable.  The other malignant appearing mass was circumferential creating a stricture with a 1.2 cm lumen located in the transverse segment approximately 4 to 5 cm in length.  There were 12-18 neoplastic appearing polyps in between the 2 obvious malignant masses ranging in size from 3 mm to 2 cm.  5 polyps distal to the transverse colon mass.  2 sigmoid colon polyps.  Cecum mass positive for adenocarcinoma.  Transverse colon mass positive for adenocarcinoma. Foundation 1 transverse colon-MSS, tumor mutation burden 7,KRAS wild-type, BRAF fusion 05/07/2019 laparoscopic extended right hemicolectomy, lysis of adhesions-5 cm invasive moderately differentiated adenocarcinoma involving cecum and proximal ascending colon, carcinoma invades into the pericolonic soft tissue; 7 cm invasive moderately differentiated carcinoma involving the transverse colon, carcinoma invades into the serosal surface (visceral peritoneum); all resection margins negative for carcinoma.  Lymphovascular invasion present.  In the proximal colon metastatic carcinoma involves 3 of 17 lymph nodes with 1 tumor deposit.  In the transverse colon 12 lymph nodes negative for metastatic carcinoma.  3 separate polyps: Tubular adenoma, inflammatory polyp and hyperplastic polyp Cycle 1 capecitabine 06/02/2019 Cycle 2 capecitabine 06/23/2019 Cycle 3 capecitabine 07/14/2019 Cycle 4 capecitabine 08/04/2019 Cycle 5 capecitabine 08/25/2019 Cycle 6 capecitabine  09/15/2019 Cycle 7 capecitabine 10/06/2019 Cycle 8 capecitabine 10/27/2019 Upper endoscopy 07/23/2020-negative Colonoscopy 07/23/2020-normal-appearing anastomoses, polyp removed from the descending colon-tubular adenoma CTs 01/18/2021-right abdominal mesenteric mass, mass in the low anatomic pelvis consistent with metastases Cycle 1 FOLFOX 02/21/2021 Cycle 2 FOLFOX 03/07/2021, dose reductions made due to mild neutropenia Cycle 3 FOLFOX 03/21/2021 Cycle 4 FOLFOX 04/04/2021 Cycle 5 FOLFOX 04/19/2021 Cycle 6 FOLFOX 05/02/2021 CTs 05/12/2021-decrease size of nodular right mesenteric mass, complete resolution of soft tissue enhancing nodule in the right inguinal canal, resolution of nodular soft tissue mass posterior to the bladder, enlargement of a left upper lobe nodule and new 4 mm left upper lobe nodule-indeterminate Cycle 7 FOLFOX 05/16/2021 Cycle 8 FOLFOX 05/30/2021, oxaliplatin and 5-FU bolus held due to neuropathy and thrombocytopenia Cycle 9 FOLFOX 06/13/2021, oxaliplatin dose reduced Cycle 10 FOLFOX 06/27/2021 CT 07/07/2021-decrease in right mesenteric mass, decreased size of lobulated left upper lobe nodule, resolution of previous "new "left upper lobe nodule, new area of wall thickening at the distal transverse colon Treatment changed to Xeloda 2 weeks on/1 week off beginning 07/18/2021 Cycle 2 Xeloda beginning 08/09/2021 Cycle 3 Xeloda beginning 08/30/2021 Cycle 4 Xeloda beginning 09/20/2021 2.   Microcytic anemia secondary to #1.    Resolved 3.   Family history of pancreas and colon cancer, negative genetic testing per the [facility] panel, ATM variant of unknown significance 4.   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  He is tolerating the Xeloda well.  He has mild changes of hand/foot syndrome.  He will complete  another cycle of Xeloda beginning 09/20/2021.  I presented his case at the GI tumor conference last week.  Resection of remaining metastatic disease was not recommended.  He will return for an office visit on 10/10/2021.  Betsy Coder, MD  09/15/2021  9:26 AM

## 2021-10-10 ENCOUNTER — Other Ambulatory Visit: Payer: Self-pay | Admitting: *Deleted

## 2021-10-10 ENCOUNTER — Encounter: Payer: Self-pay | Admitting: Nurse Practitioner

## 2021-10-10 ENCOUNTER — Inpatient Hospital Stay: Payer: 59

## 2021-10-10 ENCOUNTER — Inpatient Hospital Stay: Payer: 59 | Attending: Oncology

## 2021-10-10 ENCOUNTER — Inpatient Hospital Stay: Payer: 59 | Admitting: Nurse Practitioner

## 2021-10-10 ENCOUNTER — Other Ambulatory Visit: Payer: Self-pay

## 2021-10-10 VITALS — BP 142/59 | HR 77 | Temp 98.1°F | Resp 18 | Ht 68.0 in | Wt 172.8 lb

## 2021-10-10 DIAGNOSIS — Z800 Family history of malignant neoplasm of digestive organs: Secondary | ICD-10-CM | POA: Diagnosis not present

## 2021-10-10 DIAGNOSIS — D696 Thrombocytopenia, unspecified: Secondary | ICD-10-CM | POA: Diagnosis not present

## 2021-10-10 DIAGNOSIS — C187 Malignant neoplasm of sigmoid colon: Secondary | ICD-10-CM | POA: Diagnosis present

## 2021-10-10 DIAGNOSIS — Z95828 Presence of other vascular implants and grafts: Secondary | ICD-10-CM

## 2021-10-10 DIAGNOSIS — G629 Polyneuropathy, unspecified: Secondary | ICD-10-CM | POA: Diagnosis not present

## 2021-10-10 DIAGNOSIS — C182 Malignant neoplasm of ascending colon: Secondary | ICD-10-CM | POA: Diagnosis not present

## 2021-10-10 DIAGNOSIS — C189 Malignant neoplasm of colon, unspecified: Secondary | ICD-10-CM

## 2021-10-10 DIAGNOSIS — D509 Iron deficiency anemia, unspecified: Secondary | ICD-10-CM | POA: Diagnosis not present

## 2021-10-10 DIAGNOSIS — C180 Malignant neoplasm of cecum: Secondary | ICD-10-CM | POA: Insufficient documentation

## 2021-10-10 LAB — CBC WITH DIFFERENTIAL (CANCER CENTER ONLY)
Abs Immature Granulocytes: 0.02 10*3/uL (ref 0.00–0.07)
Basophils Absolute: 0 10*3/uL (ref 0.0–0.1)
Basophils Relative: 0 %
Eosinophils Absolute: 0.2 10*3/uL (ref 0.0–0.5)
Eosinophils Relative: 4 %
HCT: 35.5 % — ABNORMAL LOW (ref 39.0–52.0)
Hemoglobin: 12 g/dL — ABNORMAL LOW (ref 13.0–17.0)
Immature Granulocytes: 0 %
Lymphocytes Relative: 25 %
Lymphs Abs: 1.1 10*3/uL (ref 0.7–4.0)
MCH: 33.1 pg (ref 26.0–34.0)
MCHC: 33.8 g/dL (ref 30.0–36.0)
MCV: 98.1 fL (ref 80.0–100.0)
Monocytes Absolute: 0.7 10*3/uL (ref 0.1–1.0)
Monocytes Relative: 15 %
Neutro Abs: 2.5 10*3/uL (ref 1.7–7.7)
Neutrophils Relative %: 56 %
Platelet Count: 156 10*3/uL (ref 150–400)
RBC: 3.62 MIL/uL — ABNORMAL LOW (ref 4.22–5.81)
RDW: 19.9 % — ABNORMAL HIGH (ref 11.5–15.5)
WBC Count: 4.5 10*3/uL (ref 4.0–10.5)
nRBC: 0 % (ref 0.0–0.2)

## 2021-10-10 LAB — CMP (CANCER CENTER ONLY)
ALT: 9 U/L (ref 0–44)
AST: 16 U/L (ref 15–41)
Albumin: 3.7 g/dL (ref 3.5–5.0)
Alkaline Phosphatase: 60 U/L (ref 38–126)
Anion gap: 9 (ref 5–15)
BUN: 17 mg/dL (ref 8–23)
CO2: 25 mmol/L (ref 22–32)
Calcium: 9 mg/dL (ref 8.9–10.3)
Chloride: 106 mmol/L (ref 98–111)
Creatinine: 0.93 mg/dL (ref 0.61–1.24)
GFR, Estimated: >60 mL/min (ref >60)
Glucose, Bld: 128 mg/dL — ABNORMAL HIGH (ref 70–99)
Potassium: 3.9 mmol/L (ref 3.5–5.1)
Sodium: 140 mmol/L (ref 135–145)
Total Bilirubin: 0.6 mg/dL (ref 0.3–1.2)
Total Protein: 6.4 g/dL — ABNORMAL LOW (ref 6.5–8.1)

## 2021-10-10 MED ORDER — HEPARIN SOD (PORK) LOCK FLUSH 100 UNIT/ML IV SOLN
500.0000 [IU] | Freq: Once | INTRAVENOUS | Status: AC
  Administered 2021-10-10: 500 [IU] via INTRAVENOUS

## 2021-10-10 MED ORDER — CAPECITABINE 500 MG PO TABS: 1500.0000 mg | ORAL_TABLET | Freq: Two times a day (BID) | ORAL | 1 refills | Status: DC

## 2021-10-10 MED ORDER — SODIUM CHLORIDE 0.9% FLUSH
10.0000 mL | Freq: Once | INTRAVENOUS | Status: AC
  Administered 2021-10-10: 10 mL via INTRAVENOUS

## 2021-10-10 NOTE — Progress Notes (Signed)
Multiple delays in delivery of Xeloda from pharmacy. NP approves to send in next cycle to begin on 11/02/21 now to avoid another delay.

## 2021-10-10 NOTE — Progress Notes (Signed)
[place] OFFICE PROGRESS NOTE   Diagnosis: Colon cancer  INTERVAL HISTORY:   John Vance returns as scheduled.  He completed another cycle of Xeloda beginning 09/21/2021.  He denies nausea/vomiting.  No mouth sores.  No diarrhea.  No redness or pain on the hands or feet.  He does note some dryness.  He reports persistent cold sensitivity.  No pain.  He has a good appetite.  Objective:  Vital signs in last 24 hours:  Blood pressure (!) 142/59, pulse 77, temperature 98.1 F (36.7 C), temperature source Oral, resp. rate 18, height 5' 8"  (1.727 m), weight 172 lb 12.8 oz (78.4 kg), SpO2 99 %.    HEENT: No thrush or ulcers. Resp: Lungs clear bilaterally. Cardio: Regular rate and rhythm. GI: Abdomen soft and nontender.  No hepatosplenomegaly. Vascular: No leg edema. Skin: Palms with hyperpigmentation, dryness.  No erythema. Port-A-Cath without erythema.  Lab Results:  Lab Results  Component Value Date   WBC 4.5 10/10/2021   HGB 12.0 (L) 10/10/2021   HCT 35.5 (L) 10/10/2021   MCV 98.1 10/10/2021   PLT 156 10/10/2021   NEUTROABS 2.5 10/10/2021    Imaging:  No results found.  Medications: I have reviewed the patient's current medications.  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), transverse colon cancer stage IIB (T4aN0) status post extended right hemicolectomy 05/07/2019, MSS Tumor invasive superficial portion of the muscle ("internal third "), no tumor perforation, no vascular invasion or perineural invasion, negative resection margins, 0/4 lymph nodes Elevated CEA 02/10/2019 CTs 03/05/2019, compared to outside CTs from 11/22/2018-worsened wall thickening in the cecum stable apple core lesion in the mid transverse colon, no evidence of recurrent tumor at the distal colonic anastomosis, no evidence of metastatic disease Colonoscopy 03/24/2019 20-2  malignant appearing masses noted in the colon, 1 filled the cecum measuring approximately 4 cm across and friable.  The other malignant appearing mass was circumferential creating a stricture with a 1.2 cm lumen located in the transverse segment approximately 4 to 5 cm in length.  There were 12-18 neoplastic appearing polyps in between the 2 obvious malignant masses ranging in size from 3 mm to 2 cm.  5 polyps distal to the transverse colon mass.  2 sigmoid colon polyps.  Cecum mass positive for adenocarcinoma.  Transverse colon mass positive for adenocarcinoma. Foundation 1 transverse colon-MSS, tumor mutation burden 7,KRAS wild-type, BRAF fusion 05/07/2019 laparoscopic extended right hemicolectomy, lysis of adhesions-5 cm invasive moderately differentiated adenocarcinoma involving cecum and proximal ascending colon, carcinoma invades into the pericolonic soft tissue; 7 cm invasive moderately differentiated carcinoma involving the transverse colon, carcinoma invades into the serosal surface (visceral peritoneum); all resection margins negative for carcinoma.  Lymphovascular invasion present.  In the proximal colon metastatic carcinoma involves 3 of 17 lymph nodes with 1 tumor deposit.  In the transverse colon 12 lymph nodes negative for metastatic carcinoma.  3 separate polyps: Tubular adenoma, inflammatory polyp and hyperplastic polyp Cycle 1 capecitabine 06/02/2019 Cycle 2 capecitabine 06/23/2019 Cycle 3 capecitabine 07/14/2019 Cycle 4 capecitabine 08/04/2019 Cycle 5 capecitabine 08/25/2019 Cycle 6 capecitabine 09/15/2019 Cycle 7 capecitabine 10/06/2019 Cycle 8 capecitabine 10/27/2019 Upper endoscopy 07/23/2020-negative Colonoscopy 07/23/2020-normal-appearing anastomoses, polyp removed from the descending colon-tubular adenoma CTs 01/18/2021-right abdominal mesenteric mass, mass in the low anatomic pelvis consistent with metastases Cycle 1 FOLFOX 02/21/2021 Cycle 2 FOLFOX 03/07/2021, dose reductions made due to mild  neutropenia Cycle 3 FOLFOX 03/21/2021 Cycle 4  FOLFOX 04/04/2021 Cycle 5 FOLFOX 04/19/2021 Cycle 6 FOLFOX 05/02/2021 CTs 05/12/2021-decrease size of nodular right mesenteric mass, complete resolution of soft tissue enhancing nodule in the right inguinal canal, resolution of nodular soft tissue mass posterior to the bladder, enlargement of a left upper lobe nodule and new 4 mm left upper lobe nodule-indeterminate Cycle 7 FOLFOX 05/16/2021 Cycle 8 FOLFOX 05/30/2021, oxaliplatin and 5-FU bolus held due to neuropathy and thrombocytopenia Cycle 9 FOLFOX 06/13/2021, oxaliplatin dose reduced Cycle 10 FOLFOX 06/27/2021 CT 07/07/2021-decrease in right mesenteric mass, decreased size of lobulated left upper lobe nodule, resolution of previous "new "left upper lobe nodule, new area of wall thickening at the distal transverse colon Treatment changed to Xeloda 2 weeks on/1 week off beginning 07/18/2021 Cycle 2 Xeloda beginning 08/09/2021 Cycle 3 Xeloda beginning 08/30/2021 Cycle 4 Xeloda beginning 09/21/2021 Cycle 5 Xeloda beginning 10/12/2021 2.   Microcytic anemia secondary to #1.    Resolved 3.   Family history of pancreas and colon cancer, negative genetic testing per the [facility] panel, ATM variant of unknown significance 4.   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  He has completed 4 cycles of Xeloda.  He is tolerating treatment well.  There is no clinical evidence of disease progression.  He will begin the next cycle of Xeloda as scheduled 10/12/2021.  CBC from today reviewed.  Counts adequate to proceed as above.  He will return for lab and follow-up 10/27/2021.  We are available to see him sooner if needed.    Ned Card ANP/GNP-BC   10/10/2021  8:39 AM

## 2021-10-10 NOTE — Patient Instructions (Signed)

## 2021-10-27 ENCOUNTER — Encounter: Payer: Self-pay | Admitting: Nurse Practitioner

## 2021-10-27 ENCOUNTER — Inpatient Hospital Stay (HOSPITAL_BASED_OUTPATIENT_CLINIC_OR_DEPARTMENT_OTHER): Payer: 59 | Admitting: Nurse Practitioner

## 2021-10-27 ENCOUNTER — Inpatient Hospital Stay: Payer: 59 | Attending: Oncology

## 2021-10-27 ENCOUNTER — Inpatient Hospital Stay: Payer: 59

## 2021-10-27 VITALS — BP 145/58 | HR 71 | Temp 98.2°F | Resp 18 | Ht 68.0 in | Wt 171.8 lb

## 2021-10-27 DIAGNOSIS — C182 Malignant neoplasm of ascending colon: Secondary | ICD-10-CM | POA: Insufficient documentation

## 2021-10-27 DIAGNOSIS — Z800 Family history of malignant neoplasm of digestive organs: Secondary | ICD-10-CM | POA: Diagnosis not present

## 2021-10-27 DIAGNOSIS — L271 Localized skin eruption due to drugs and medicaments taken internally: Secondary | ICD-10-CM | POA: Diagnosis not present

## 2021-10-27 DIAGNOSIS — D6959 Other secondary thrombocytopenia: Secondary | ICD-10-CM | POA: Diagnosis not present

## 2021-10-27 DIAGNOSIS — Z95828 Presence of other vascular implants and grafts: Secondary | ICD-10-CM

## 2021-10-27 DIAGNOSIS — R918 Other nonspecific abnormal finding of lung field: Secondary | ICD-10-CM | POA: Insufficient documentation

## 2021-10-27 DIAGNOSIS — G620 Drug-induced polyneuropathy: Secondary | ICD-10-CM | POA: Diagnosis not present

## 2021-10-27 DIAGNOSIS — C187 Malignant neoplasm of sigmoid colon: Secondary | ICD-10-CM | POA: Insufficient documentation

## 2021-10-27 DIAGNOSIS — C189 Malignant neoplasm of colon, unspecified: Secondary | ICD-10-CM

## 2021-10-27 DIAGNOSIS — D509 Iron deficiency anemia, unspecified: Secondary | ICD-10-CM | POA: Insufficient documentation

## 2021-10-27 DIAGNOSIS — C180 Malignant neoplasm of cecum: Secondary | ICD-10-CM | POA: Insufficient documentation

## 2021-10-27 DIAGNOSIS — Z9049 Acquired absence of other specified parts of digestive tract: Secondary | ICD-10-CM | POA: Diagnosis not present

## 2021-10-27 LAB — CBC WITH DIFFERENTIAL (CANCER CENTER ONLY)
Abs Immature Granulocytes: 0.02 10*3/uL (ref 0.00–0.07)
Basophils Absolute: 0 10*3/uL (ref 0.0–0.1)
Basophils Relative: 0 %
Eosinophils Absolute: 0.2 10*3/uL (ref 0.0–0.5)
Eosinophils Relative: 4 %
HCT: 35.4 % — ABNORMAL LOW (ref 39.0–52.0)
Hemoglobin: 12.1 g/dL — ABNORMAL LOW (ref 13.0–17.0)
Immature Granulocytes: 0 %
Lymphocytes Relative: 24 %
Lymphs Abs: 1.2 10*3/uL (ref 0.7–4.0)
MCH: 33.7 pg (ref 26.0–34.0)
MCHC: 34.2 g/dL (ref 30.0–36.0)
MCV: 98.6 fL (ref 80.0–100.0)
Monocytes Absolute: 0.6 10*3/uL (ref 0.1–1.0)
Monocytes Relative: 13 %
Neutro Abs: 2.9 10*3/uL (ref 1.7–7.7)
Neutrophils Relative %: 59 %
Platelet Count: 134 10*3/uL — ABNORMAL LOW (ref 150–400)
RBC: 3.59 MIL/uL — ABNORMAL LOW (ref 4.22–5.81)
RDW: 17.9 % — ABNORMAL HIGH (ref 11.5–15.5)
WBC Count: 5 10*3/uL (ref 4.0–10.5)
nRBC: 0 % (ref 0.0–0.2)

## 2021-10-27 LAB — CMP (CANCER CENTER ONLY)
ALT: 10 U/L (ref 0–44)
AST: 19 U/L (ref 15–41)
Albumin: 4 g/dL (ref 3.5–5.0)
Alkaline Phosphatase: 78 U/L (ref 38–126)
Anion gap: 9 (ref 5–15)
BUN: 19 mg/dL (ref 8–23)
CO2: 25 mmol/L (ref 22–32)
Calcium: 9.3 mg/dL (ref 8.9–10.3)
Chloride: 105 mmol/L (ref 98–111)
Creatinine: 0.97 mg/dL (ref 0.61–1.24)
GFR, Estimated: >60 mL/min (ref >60)
Glucose, Bld: 135 mg/dL — ABNORMAL HIGH (ref 70–99)
Potassium: 3.9 mmol/L (ref 3.5–5.1)
Sodium: 139 mmol/L (ref 135–145)
Total Bilirubin: 0.9 mg/dL (ref 0.3–1.2)
Total Protein: 6.6 g/dL (ref 6.5–8.1)

## 2021-10-27 LAB — CEA (ACCESS): CEA (CHCC): 3.61 ng/mL (ref 0.00–5.00)

## 2021-10-27 MED ORDER — HEPARIN SOD (PORK) LOCK FLUSH 100 UNIT/ML IV SOLN
500.0000 [IU] | Freq: Once | INTRAVENOUS | Status: AC
  Administered 2021-10-27: 500 [IU] via INTRAVENOUS

## 2021-10-27 MED ORDER — SODIUM CHLORIDE 0.9% FLUSH
10.0000 mL | Freq: Once | INTRAVENOUS | Status: AC
  Administered 2021-10-27: 10 mL via INTRAVENOUS

## 2021-10-27 NOTE — Progress Notes (Signed)
[place] OFFICE PROGRESS NOTE   Diagnosis: Colon cancer  INTERVAL HISTORY:   John Vance returns as scheduled.  He completed cycle 5 Xeloda beginning 10/12/2021.  He denies nausea/vomiting.  No mouth sores.  No diarrhea.  Hands and feet are dry, dark.  Soles with mild "sensitivity".  Objective:  Vital signs in last 24 hours:  Blood pressure (!) 145/58, pulse 71, temperature 98.2 F (36.8 C), temperature source Oral, resp. rate 18, height 5' 8"  (1.727 m), weight 171 lb 12.8 oz (77.9 kg), SpO2 99 %.    HEENT: No thrush or ulcers. Resp: Lungs clear bilaterally. Cardio: Regular rate and rhythm. GI: Abdomen soft and nontender.  No hepatosplenomegaly. Vascular: No leg edema. Skin: Palms with mild hyperpigmentation, dryness.  Soles with hyperpigmentation, mild erythema. Port-A-Cath without erythema.   Lab Results:  Lab Results  Component Value Date   WBC 5.0 10/27/2021   HGB 12.1 (L) 10/27/2021   HCT 35.4 (L) 10/27/2021   MCV 98.6 10/27/2021   PLT 134 (L) 10/27/2021   NEUTROABS 2.9 10/27/2021    Imaging:  No results found.  Medications: I have reviewed the patient's current medications.  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), transverse colon cancer stage IIB (T4aN0) status post extended right hemicolectomy 05/07/2019, MSS Tumor invasive superficial portion of the muscle ("internal third "), no tumor perforation, no vascular invasion or perineural invasion, negative resection margins, 0/4 lymph nodes Elevated CEA 02/10/2019 CTs 03/05/2019, compared to outside CTs from 11/22/2018-worsened wall thickening in the cecum stable apple core lesion in the mid transverse colon, no evidence of recurrent tumor at the distal colonic anastomosis, no evidence of metastatic disease Colonoscopy 03/24/2019 20-2 malignant appearing masses noted in the colon, 1  filled the cecum measuring approximately 4 cm across and friable.  The other malignant appearing mass was circumferential creating a stricture with a 1.2 cm lumen located in the transverse segment approximately 4 to 5 cm in length.  There were 12-18 neoplastic appearing polyps in between the 2 obvious malignant masses ranging in size from 3 mm to 2 cm.  5 polyps distal to the transverse colon mass.  2 sigmoid colon polyps.  Cecum mass positive for adenocarcinoma.  Transverse colon mass positive for adenocarcinoma. Foundation 1 transverse colon-MSS, tumor mutation burden 7,KRAS wild-type, BRAF fusion 05/07/2019 laparoscopic extended right hemicolectomy, lysis of adhesions-5 cm invasive moderately differentiated adenocarcinoma involving cecum and proximal ascending colon, carcinoma invades into the pericolonic soft tissue; 7 cm invasive moderately differentiated carcinoma involving the transverse colon, carcinoma invades into the serosal surface (visceral peritoneum); all resection margins negative for carcinoma.  Lymphovascular invasion present.  In the proximal colon metastatic carcinoma involves 3 of 17 lymph nodes with 1 tumor deposit.  In the transverse colon 12 lymph nodes negative for metastatic carcinoma.  3 separate polyps: Tubular adenoma, inflammatory polyp and hyperplastic polyp Cycle 1 capecitabine 06/02/2019 Cycle 2 capecitabine 06/23/2019 Cycle 3 capecitabine 07/14/2019 Cycle 4 capecitabine 08/04/2019 Cycle 5 capecitabine 08/25/2019 Cycle 6 capecitabine 09/15/2019 Cycle 7 capecitabine 10/06/2019 Cycle 8 capecitabine 10/27/2019 Upper endoscopy 07/23/2020-negative Colonoscopy 07/23/2020-normal-appearing anastomoses, polyp removed from the descending colon-tubular adenoma CTs 01/18/2021-right abdominal mesenteric mass, mass in the low anatomic pelvis consistent with metastases Cycle 1 FOLFOX 02/21/2021 Cycle 2 FOLFOX 03/07/2021, dose reductions made due to mild neutropenia Cycle 3 FOLFOX 03/21/2021 Cycle 4  FOLFOX 04/04/2021 Cycle 5 FOLFOX 04/19/2021 Cycle 6 FOLFOX 05/02/2021 CTs 05/12/2021-decrease size of  nodular right mesenteric mass, complete resolution of soft tissue enhancing nodule in the right inguinal canal, resolution of nodular soft tissue mass posterior to the bladder, enlargement of a left upper lobe nodule and new 4 mm left upper lobe nodule-indeterminate Cycle 7 FOLFOX 05/16/2021 Cycle 8 FOLFOX 05/30/2021, oxaliplatin and 5-FU bolus held due to neuropathy and thrombocytopenia Cycle 9 FOLFOX 06/13/2021, oxaliplatin dose reduced Cycle 10 FOLFOX 06/27/2021 CT 07/07/2021-decrease in right mesenteric mass, decreased size of lobulated left upper lobe nodule, resolution of previous "new "left upper lobe nodule, new area of wall thickening at the distal transverse colon Treatment changed to Xeloda 2 weeks on/1 week off beginning 07/18/2021 Cycle 2 Xeloda beginning 08/09/2021 Cycle 3 Xeloda beginning 08/30/2021 Cycle 4 Xeloda beginning 09/21/2021 Cycle 5 Xeloda beginning 10/12/2021 Cycle 6 Xeloda beginning 11/02/2021 2.   Microcytic anemia secondary to #1.    Resolved 3.   Family history of pancreas and colon cancer, negative genetic testing per the [facility] panel, ATM variant of unknown significance 4.   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  He has completed 5 cycles of Xeloda.  He continues to tolerate well.  There is no clinical evidence of disease progression.  He will begin cycle 6 as scheduled on 11/02/2021.  Restaging CTs prior to next office visit.  He has mild hand-foot syndrome.  He understands to contact the office if symptoms worsen.  CBC and chemistry panel reviewed.  Labs adequate to proceed with Xeloda as above.  He will return for lab and follow-up 11/17/2021.  We are available to see him sooner if  needed.    Ned Card ANP/GNP-BC   10/27/2021  8:57 AM

## 2021-10-27 NOTE — Patient Instructions (Signed)

## 2021-11-14 ENCOUNTER — Other Ambulatory Visit: Payer: Self-pay

## 2021-11-14 ENCOUNTER — Ambulatory Visit (HOSPITAL_BASED_OUTPATIENT_CLINIC_OR_DEPARTMENT_OTHER)
Admission: RE | Admit: 2021-11-14 | Discharge: 2021-11-14 | Disposition: A | Discharge status: Home / Self Care | Payer: 59 | Source: Ambulatory Visit | Attending: Nurse Practitioner | Admitting: Nurse Practitioner

## 2021-11-14 DIAGNOSIS — C189 Malignant neoplasm of colon, unspecified: Secondary | ICD-10-CM | POA: Insufficient documentation

## 2021-11-14 MED ORDER — IOHEXOL 300 MG/ML  SOLN
100.0000 mL | Freq: Once | INTRAMUSCULAR | Status: AC | PRN
  Administered 2021-11-14: 85 mL via INTRAVENOUS

## 2021-11-15 ENCOUNTER — Other Ambulatory Visit: Payer: Self-pay

## 2021-11-17 ENCOUNTER — Inpatient Hospital Stay: Payer: 59

## 2021-11-17 ENCOUNTER — Inpatient Hospital Stay (HOSPITAL_BASED_OUTPATIENT_CLINIC_OR_DEPARTMENT_OTHER): Payer: 59 | Admitting: Nurse Practitioner

## 2021-11-17 ENCOUNTER — Encounter: Payer: Self-pay | Admitting: Nurse Practitioner

## 2021-11-17 VITALS — BP 146/63 | HR 86 | Temp 98.2°F | Resp 18 | Ht 68.0 in | Wt 169.2 lb

## 2021-11-17 DIAGNOSIS — C189 Malignant neoplasm of colon, unspecified: Secondary | ICD-10-CM | POA: Diagnosis not present

## 2021-11-17 DIAGNOSIS — Z95828 Presence of other vascular implants and grafts: Secondary | ICD-10-CM

## 2021-11-17 DIAGNOSIS — C187 Malignant neoplasm of sigmoid colon: Secondary | ICD-10-CM | POA: Diagnosis not present

## 2021-11-17 LAB — CBC WITH DIFFERENTIAL (CANCER CENTER ONLY)
Abs Immature Granulocytes: 0.01 10*3/uL (ref 0.00–0.07)
Basophils Absolute: 0 10*3/uL (ref 0.0–0.1)
Basophils Relative: 0 %
Eosinophils Absolute: 0.2 10*3/uL (ref 0.0–0.5)
Eosinophils Relative: 5 %
HCT: 35.7 % — ABNORMAL LOW (ref 39.0–52.0)
Hemoglobin: 12.4 g/dL — ABNORMAL LOW (ref 13.0–17.0)
Immature Granulocytes: 0 %
Lymphocytes Relative: 19 %
Lymphs Abs: 0.9 10*3/uL (ref 0.7–4.0)
MCH: 34.9 pg — ABNORMAL HIGH (ref 26.0–34.0)
MCHC: 34.7 g/dL (ref 30.0–36.0)
MCV: 100.6 fL — ABNORMAL HIGH (ref 80.0–100.0)
Monocytes Absolute: 0.7 10*3/uL (ref 0.1–1.0)
Monocytes Relative: 15 %
Neutro Abs: 2.9 10*3/uL (ref 1.7–7.7)
Neutrophils Relative %: 61 %
Platelet Count: 137 10*3/uL — ABNORMAL LOW (ref 150–400)
RBC: 3.55 MIL/uL — ABNORMAL LOW (ref 4.22–5.81)
RDW: 16.4 % — ABNORMAL HIGH (ref 11.5–15.5)
WBC Count: 4.8 10*3/uL (ref 4.0–10.5)
nRBC: 0 % (ref 0.0–0.2)

## 2021-11-17 LAB — CMP (CANCER CENTER ONLY)
ALT: 10 U/L (ref 0–44)
AST: 19 U/L (ref 15–41)
Albumin: 4.2 g/dL (ref 3.5–5.0)
Alkaline Phosphatase: 64 U/L (ref 38–126)
Anion gap: 9 (ref 5–15)
BUN: 19 mg/dL (ref 8–23)
CO2: 24 mmol/L (ref 22–32)
Calcium: 9.7 mg/dL (ref 8.9–10.3)
Chloride: 106 mmol/L (ref 98–111)
Creatinine: 0.98 mg/dL (ref 0.61–1.24)
GFR, Estimated: >60 mL/min (ref >60)
Glucose, Bld: 125 mg/dL — ABNORMAL HIGH (ref 70–99)
Potassium: 4 mmol/L (ref 3.5–5.1)
Sodium: 139 mmol/L (ref 135–145)
Total Bilirubin: 0.9 mg/dL (ref 0.3–1.2)
Total Protein: 6.4 g/dL — ABNORMAL LOW (ref 6.5–8.1)

## 2021-11-17 LAB — CEA (ACCESS): CEA (CHCC): 3.59 ng/mL (ref 0.00–5.00)

## 2021-11-17 MED ORDER — HEPARIN SOD (PORK) LOCK FLUSH 100 UNIT/ML IV SOLN
500.0000 [IU] | Freq: Once | INTRAVENOUS | Status: AC
  Administered 2021-11-17: 500 [IU] via INTRAVENOUS

## 2021-11-17 MED ORDER — SODIUM CHLORIDE 0.9% FLUSH
10.0000 mL | Freq: Once | INTRAVENOUS | Status: AC
  Administered 2021-11-17: 10 mL via INTRAVENOUS

## 2021-11-17 NOTE — Patient Instructions (Signed)

## 2021-11-17 NOTE — Progress Notes (Signed)
[place] OFFICE PROGRESS NOTE   Diagnosis: Colon cancer  INTERVAL HISTORY:   John Vance returns as scheduled.  He completed cycle 6 Xeloda beginning 11/02/2021.  He denies nausea/vomiting.  No mouth sores.  No diarrhea.  No hand or foot pain or redness.  Intermittent discomfort on the soles of his feet with walking.  He does not describe this as pain, more of an abnormal sensation.  He has a good appetite.  No abdominal pain.  Objective:  Vital signs in last 24 hours:  Blood pressure (!) 146/63, pulse 86, temperature 98.2 F (36.8 C), temperature source Oral, resp. rate 18, height 5' 8"  (1.727 m), weight 169 lb 3.2 oz (76.7 kg), SpO2 99 %.    HEENT: No thrush or ulcers. Resp: Lungs clear bilaterally. Cardio: Regular rate and rhythm. GI: Abdomen soft and nontender.  No hepatosplenomegaly.  No mass. Vascular: No leg edema. Skin: Palms and soles with hyperpigmentation, skin thickening.  No skin breakdown. Port-A-Cath without erythema.   Lab Results:  Lab Results  Component Value Date   WBC 4.8 11/17/2021   HGB 12.4 (L) 11/17/2021   HCT 35.7 (L) 11/17/2021   MCV 100.6 (H) 11/17/2021   PLT 137 (L) 11/17/2021   NEUTROABS 2.9 11/17/2021    Imaging:  No results found.  Medications: I have reviewed the patient's current medications.  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), transverse colon cancer stage IIB (T4aN0) status post extended right hemicolectomy 05/07/2019, MSS Tumor invasive superficial portion of the muscle ("internal third "), no tumor perforation, no vascular invasion or perineural invasion, negative resection margins, 0/4 lymph nodes Elevated CEA 02/10/2019 CTs 03/05/2019, compared to outside CTs from 11/22/2018-worsened wall thickening in the cecum stable apple core lesion in the mid transverse colon, no evidence of recurrent  tumor at the distal colonic anastomosis, no evidence of metastatic disease Colonoscopy 03/24/2019 20-2 malignant appearing masses noted in the colon, 1 filled the cecum measuring approximately 4 cm across and friable.  The other malignant appearing mass was circumferential creating a stricture with a 1.2 cm lumen located in the transverse segment approximately 4 to 5 cm in length.  There were 12-18 neoplastic appearing polyps in between the 2 obvious malignant masses ranging in size from 3 mm to 2 cm.  5 polyps distal to the transverse colon mass.  2 sigmoid colon polyps.  Cecum mass positive for adenocarcinoma.  Transverse colon mass positive for adenocarcinoma. Foundation 1 transverse colon-MSS, tumor mutation burden 7, KRAS wild-type, BRAF fusion 05/07/2019 laparoscopic extended right hemicolectomy, lysis of adhesions-5 cm invasive moderately differentiated adenocarcinoma involving cecum and proximal ascending colon, carcinoma invades into the pericolonic soft tissue; 7 cm invasive moderately differentiated carcinoma involving the transverse colon, carcinoma invades into the serosal surface (visceral peritoneum); all resection margins negative for carcinoma.  Lymphovascular invasion present.  In the proximal colon metastatic carcinoma involves 3 of 17 lymph nodes with 1 tumor deposit.  In the transverse colon 12 lymph nodes negative for metastatic carcinoma.  3 separate polyps: Tubular adenoma, inflammatory polyp and hyperplastic polyp Cycle 1 capecitabine 06/02/2019 Cycle 2 capecitabine 06/23/2019 Cycle 3 capecitabine 07/14/2019 Cycle 4 capecitabine 08/04/2019 Cycle 5 capecitabine 08/25/2019 Cycle 6 capecitabine 09/15/2019 Cycle 7 capecitabine 10/06/2019 Cycle 8 capecitabine 10/27/2019 Upper endoscopy 07/23/2020-negative Colonoscopy 07/23/2020-normal-appearing anastomoses, polyp removed from the descending colon-tubular adenoma CTs 01/18/2021-right abdominal mesenteric mass, mass in the low anatomic pelvis  consistent with metastases  Cycle 1 FOLFOX 02/21/2021 Cycle 2 FOLFOX 03/07/2021, dose reductions made due to mild neutropenia Cycle 3 FOLFOX 03/21/2021 Cycle 4 FOLFOX 04/04/2021 Cycle 5 FOLFOX 04/19/2021 Cycle 6 FOLFOX 05/02/2021 CTs 05/12/2021-decrease size of nodular right mesenteric mass, complete resolution of soft tissue enhancing nodule in the right inguinal canal, resolution of nodular soft tissue mass posterior to the bladder, enlargement of a left upper lobe nodule and new 4 mm left upper lobe nodule-indeterminate Cycle 7 FOLFOX 05/16/2021 Cycle 8 FOLFOX 05/30/2021, oxaliplatin and 5-FU bolus held due to neuropathy and thrombocytopenia Cycle 9 FOLFOX 06/13/2021, oxaliplatin dose reduced Cycle 10 FOLFOX 06/27/2021 CT 07/07/2021-decrease in right mesenteric mass, decreased size of lobulated left upper lobe nodule, resolution of previous "new "left upper lobe nodule, new area of wall thickening at the distal transverse colon Treatment changed to Xeloda 2 weeks on/1 week off beginning 07/18/2021 Cycle 2 Xeloda beginning 08/09/2021 Cycle 3 Xeloda beginning 08/30/2021 Cycle 4 Xeloda beginning 09/21/2021 Cycle 5 Xeloda beginning 10/12/2021 Cycle 6 Xeloda beginning 11/02/2021 CTs 11/14/2021-right lower quadrant spiculated small bowel mesenteric mass measured 2.8 x 3.7 cm, previously 1.0 x 2.3 cm; adjacent mesenteric haziness and nodularity measuring up to 8 mm superiorly.  New 4 mm nodular lesion posterior segment right upper lobe along the major fissure. Treatment break beginning 11/17/2021 2.   Microcytic anemia secondary to #1.    Resolved 3.   Family history of pancreas and colon cancer, negative genetic testing per the [facility] panel, ATM variant of unknown significance 4.   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  He has  completed 6 cycles of maintenance Xeloda.  Recent restaging CTs show evidence of mild progression involving a right lower quadrant mesenteric mass.  He is asymptomatic.  Dr. Benay Spice recommends a treatment break.  His case will be presented at an upcoming GI tumor conference.  He will return for port flush, CEA and follow-up appointment in 6 weeks.  We are available to see him sooner if needed.  Patient seen with Dr. Benay Spice.  CT images reviewed on the computer with John Vance and his son.    Ned Card ANP/GNP-BC   11/17/2021  8:47 AM   This was a shared visit with Ned Card.  We reviewed the CT findings and images with John Vance and his son.  There has been mild progression of disease while on Xeloda maintenance.  We discussed observation versus salvage systemic therapy.  Salvage treatment options include FOLFIRI with the addition of bevacizumab or panitumumab.  I will present his case at the next GI conference to review the CT images and again discuss the indication to consider resection of the right lower quadrant mass.  I was present for greater than 50% of today's visit.  I performed medical decision making.  Julieanne Manson, MD

## 2021-11-17 NOTE — Progress Notes (Signed)
Spanish interpreter Sheppard Coil present during flush appt.

## 2021-11-18 ENCOUNTER — Encounter: Payer: Self-pay | Admitting: Oncology

## 2021-11-18 ENCOUNTER — Other Ambulatory Visit: Payer: Self-pay

## 2021-11-22 ENCOUNTER — Other Ambulatory Visit: Payer: Self-pay

## 2021-11-23 ENCOUNTER — Other Ambulatory Visit: Payer: Self-pay

## 2021-12-07 ENCOUNTER — Other Ambulatory Visit: Payer: Self-pay

## 2021-12-07 NOTE — Progress Notes (Signed)
The proposed treatment discussed in conference is for discussion purpose only and is not a binding recommendation.  The patients have not been physically examined, or presented with their treatment options.  Therefore, final treatment plans cannot be decided.  

## 2021-12-21 ENCOUNTER — Other Ambulatory Visit: Payer: Self-pay | Admitting: Oncology

## 2021-12-29 ENCOUNTER — Inpatient Hospital Stay (HOSPITAL_BASED_OUTPATIENT_CLINIC_OR_DEPARTMENT_OTHER): Payer: Commercial Managed Care - HMO | Admitting: Oncology

## 2021-12-29 ENCOUNTER — Inpatient Hospital Stay: Payer: Commercial Managed Care - HMO

## 2021-12-29 ENCOUNTER — Inpatient Hospital Stay: Payer: Commercial Managed Care - HMO | Attending: Oncology

## 2021-12-29 VITALS — BP 142/63 | HR 64 | Temp 97.9°F | Resp 20 | Wt 170.8 lb

## 2021-12-29 DIAGNOSIS — G629 Polyneuropathy, unspecified: Secondary | ICD-10-CM | POA: Diagnosis not present

## 2021-12-29 DIAGNOSIS — C189 Malignant neoplasm of colon, unspecified: Secondary | ICD-10-CM

## 2021-12-29 DIAGNOSIS — Z9049 Acquired absence of other specified parts of digestive tract: Secondary | ICD-10-CM | POA: Diagnosis not present

## 2021-12-29 DIAGNOSIS — C180 Malignant neoplasm of cecum: Secondary | ICD-10-CM | POA: Diagnosis not present

## 2021-12-29 DIAGNOSIS — D696 Thrombocytopenia, unspecified: Secondary | ICD-10-CM | POA: Insufficient documentation

## 2021-12-29 DIAGNOSIS — C182 Malignant neoplasm of ascending colon: Secondary | ICD-10-CM | POA: Insufficient documentation

## 2021-12-29 DIAGNOSIS — D509 Iron deficiency anemia, unspecified: Secondary | ICD-10-CM | POA: Insufficient documentation

## 2021-12-29 DIAGNOSIS — C187 Malignant neoplasm of sigmoid colon: Secondary | ICD-10-CM | POA: Diagnosis present

## 2021-12-29 DIAGNOSIS — Z95828 Presence of other vascular implants and grafts: Secondary | ICD-10-CM

## 2021-12-29 LAB — CEA (ACCESS): CEA (CHCC): 3.99 ng/mL (ref 0.00–5.00)

## 2021-12-29 MED ORDER — SODIUM CHLORIDE 0.9% FLUSH
10.0000 mL | Freq: Once | INTRAVENOUS | Status: AC
  Administered 2021-12-29: 10 mL via INTRAVENOUS

## 2021-12-29 MED ORDER — HEPARIN SOD (PORK) LOCK FLUSH 100 UNIT/ML IV SOLN
500.0000 [IU] | Freq: Once | INTRAVENOUS | Status: AC
  Administered 2021-12-29: 500 [IU] via INTRAVENOUS

## 2021-12-29 NOTE — Progress Notes (Signed)
[place] OFFICE PROGRESS NOTE   Diagnosis: Colon cancer  INTERVAL HISTORY:   John Vance returns as scheduled.  He is here with his son.  He feels well.  He has occasional sharp pain in the right lower lateral abdomen lasting for 1 minute.  This has occurred over the past week.  No other complaint.  No difficulty with bowel function.  He is exercising on a treadmill.  Objective:  Vital signs in last 24 hours:  Blood pressure (!) 142/63, pulse 64, temperature 97.9 F (36.6 C), temperature source Oral, resp. rate 20, weight 170 lb 12.8 oz (77.5 kg), SpO2 98 %.    Lymphatics: No cervical, supraclavicular, axillary, or inguinal nodes Resp: Lungs clear bilaterally Cardio: Regular rate and rhythm GI: No hepatosplenomegaly, no mass, nontender Vascular: No leg edema  Portacath/PICC-without erythema  Lab Results:  Lab Results  Component Value Date   WBC 4.8 11/17/2021   HGB 12.4 (L) 11/17/2021   HCT 35.7 (L) 11/17/2021   MCV 100.6 (H) 11/17/2021   PLT 137 (L) 11/17/2021   NEUTROABS 2.9 11/17/2021    CMP  Lab Results  Component Value Date   NA 139 11/17/2021   K 4.0 11/17/2021   CL 106 11/17/2021   CO2 24 11/17/2021   GLUCOSE 125 (H) 11/17/2021   BUN 19 11/17/2021   CREATININE 0.98 11/17/2021   CALCIUM 9.7 11/17/2021   PROT 6.4 (L) 11/17/2021   ALBUMIN 4.2 11/17/2021   AST 19 11/17/2021   ALT 10 11/17/2021   ALKPHOS 64 11/17/2021   BILITOT 0.9 11/17/2021   GFRNONAA >60 11/17/2021   GFRAA >60 12/01/2019    Lab Results  Component Value Date   CEA1 12.10 (H) 01/03/2021   CEA 3.99 12/29/2021    Lab Results  Component Value Date   INR 1.0 05/02/2019   LABPROT 13.2 05/02/2019    Imaging:  No results found.  Medications: I have reviewed the patient's current medications.   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),  transverse colon cancer stage IIB (T4aN0) status post extended right hemicolectomy 05/07/2019, MSS Tumor invasive superficial portion of the muscle ("internal third "), no tumor perforation, no vascular invasion or perineural invasion, negative resection margins, 0/4 lymph nodes Elevated CEA 02/10/2019 CTs 03/05/2019, compared to outside CTs from 11/22/2018-worsened wall thickening in the cecum stable apple core lesion in the mid transverse colon, no evidence of recurrent tumor at the distal colonic anastomosis, no evidence of metastatic disease Colonoscopy 03/24/2019 20-2 malignant appearing masses noted in the colon, 1 filled the cecum measuring approximately 4 cm across and friable.  The other malignant appearing mass was circumferential creating a stricture with a 1.2 cm lumen located in the transverse segment approximately 4 to 5 cm in length.  There were 12-18 neoplastic appearing polyps in between the 2 obvious malignant masses ranging in size from 3 mm to 2 cm.  5 polyps distal to the transverse colon mass.  2 sigmoid colon polyps.  Cecum mass positive for adenocarcinoma.  Transverse colon mass positive for adenocarcinoma. Foundation 1 transverse colon-MSS, tumor mutation burden 7, KRAS wild-type, BRAF fusion 05/07/2019 laparoscopic extended right hemicolectomy, lysis of adhesions-5 cm invasive moderately differentiated adenocarcinoma involving cecum and proximal ascending colon, carcinoma invades into the pericolonic soft tissue; 7 cm invasive moderately differentiated carcinoma involving the transverse colon, carcinoma invades into the serosal surface (visceral peritoneum); all resection margins negative for carcinoma.  Lymphovascular invasion present.  In the proximal colon metastatic carcinoma involves 3 of 17 lymph nodes with 1 tumor deposit.  In the transverse colon 12 lymph nodes negative for metastatic carcinoma.  3 separate polyps: Tubular adenoma, inflammatory polyp and hyperplastic polyp Cycle  1 capecitabine 06/02/2019 Cycle 2 capecitabine 06/23/2019 Cycle 3 capecitabine 07/14/2019 Cycle 4 capecitabine 08/04/2019 Cycle 5 capecitabine 08/25/2019 Cycle 6 capecitabine 09/15/2019 Cycle 7 capecitabine 10/06/2019 Cycle 8 capecitabine 10/27/2019 Upper endoscopy 07/23/2020-negative Colonoscopy 07/23/2020-normal-appearing anastomoses, polyp removed from the descending colon-tubular adenoma CTs 01/18/2021-right abdominal mesenteric mass, mass in the low anatomic pelvis consistent with metastases Cycle 1 FOLFOX 02/21/2021 Cycle 2 FOLFOX 03/07/2021, dose reductions made due to mild neutropenia Cycle 3 FOLFOX 03/21/2021 Cycle 4 FOLFOX 04/04/2021 Cycle 5 FOLFOX 04/19/2021 Cycle 6 FOLFOX 05/02/2021 CTs 05/12/2021-decrease size of nodular right mesenteric mass, complete resolution of soft tissue enhancing nodule in the right inguinal canal, resolution of nodular soft tissue mass posterior to the bladder, enlargement of a left upper lobe nodule and new 4 mm left upper lobe nodule-indeterminate Cycle 7 FOLFOX 05/16/2021 Cycle 8 FOLFOX 05/30/2021, oxaliplatin and 5-FU bolus held due to neuropathy and thrombocytopenia Cycle 9 FOLFOX 06/13/2021, oxaliplatin dose reduced Cycle 10 FOLFOX 06/27/2021 CT 07/07/2021-decrease in right mesenteric mass, decreased size of lobulated left upper lobe nodule, resolution of previous "new "left upper lobe nodule, new area of wall thickening at the distal transverse colon Treatment changed to Xeloda 2 weeks on/1 week off beginning 07/18/2021 Cycle 2 Xeloda beginning 08/09/2021 Cycle 3 Xeloda beginning 08/30/2021 Cycle 4 Xeloda beginning 09/21/2021 Cycle 5 Xeloda beginning 10/12/2021 Cycle 6 Xeloda beginning 11/02/2021 CTs 11/14/2021-right lower quadrant spiculated small bowel mesenteric mass measured 2.8 x 3.7 cm, previously 1.0 x 2.3 cm; adjacent mesenteric haziness and nodularity measuring up to 8 mm superiorly.  New 4 mm nodular lesion posterior segment right upper lobe along the major  fissure. Treatment break beginning 11/17/2021 2.   Microcytic anemia secondary to #1.    Resolved 3.   Family history of pancreas and colon cancer, negative genetic testing per the [facility] panel, ATM variant of unknown significance 4.   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  The right lower abdomen pain could be related to the mesenteric mass.  He will call for increased pain.  He is otherwise asymptomatic from the metastatic colon cancer.  His case was presented at the GI tumor conference 12/07/2021.  He is not a surgical candidate.  The plan is to continue observation.  We will schedule repeat imaging within the next few months.  He will return for an office visit in 6 weeks.  Betsy Coder, MD  12/29/2021  9:42 AM

## 2021-12-29 NOTE — Progress Notes (Signed)
Visit today with assistance of interpreter, Braulio Conte

## 2021-12-29 NOTE — Progress Notes (Signed)
Restaurant manager, [employer] from [employer] at [place]

## 2022-02-09 ENCOUNTER — Inpatient Hospital Stay: Payer: Commercial Managed Care - HMO | Admitting: Nurse Practitioner

## 2022-02-09 ENCOUNTER — Other Ambulatory Visit: Payer: 59

## 2022-02-09 ENCOUNTER — Inpatient Hospital Stay: Payer: Commercial Managed Care - HMO

## 2022-02-09 ENCOUNTER — Encounter: Payer: Self-pay | Admitting: Nurse Practitioner

## 2022-02-09 ENCOUNTER — Inpatient Hospital Stay: Payer: Commercial Managed Care - HMO | Attending: Oncology

## 2022-02-09 VITALS — BP 140/65 | HR 67 | Temp 98.2°F | Resp 18 | Ht 68.0 in | Wt 174.6 lb

## 2022-02-09 DIAGNOSIS — Z23 Encounter for immunization: Secondary | ICD-10-CM | POA: Diagnosis not present

## 2022-02-09 DIAGNOSIS — G620 Drug-induced polyneuropathy: Secondary | ICD-10-CM | POA: Insufficient documentation

## 2022-02-09 DIAGNOSIS — D696 Thrombocytopenia, unspecified: Secondary | ICD-10-CM | POA: Diagnosis not present

## 2022-02-09 DIAGNOSIS — C188 Malignant neoplasm of overlapping sites of colon: Secondary | ICD-10-CM | POA: Diagnosis present

## 2022-02-09 DIAGNOSIS — C189 Malignant neoplasm of colon, unspecified: Secondary | ICD-10-CM | POA: Diagnosis not present

## 2022-02-09 DIAGNOSIS — Z800 Family history of malignant neoplasm of digestive organs: Secondary | ICD-10-CM | POA: Insufficient documentation

## 2022-02-09 LAB — BASIC METABOLIC PANEL - CANCER CENTER ONLY
Anion gap: 7 (ref 5–15)
BUN: 18 mg/dL (ref 8–23)
CO2: 26 mmol/L (ref 22–32)
Calcium: 9.7 mg/dL (ref 8.9–10.3)
Chloride: 102 mmol/L (ref 98–111)
Creatinine: 1.02 mg/dL (ref 0.61–1.24)
GFR, Estimated: >60 mL/min (ref >60)
Glucose, Bld: 119 mg/dL — ABNORMAL HIGH (ref 70–99)
Potassium: 3.9 mmol/L (ref 3.5–5.1)
Sodium: 135 mmol/L (ref 135–145)

## 2022-02-09 LAB — CEA (ACCESS): CEA (CHCC): 6.1 ng/mL — ABNORMAL HIGH (ref 0.00–5.00)

## 2022-02-09 MED ORDER — INFLUENZA VAC A&B SA ADJ QUAD 0.5 ML IM PRSY
0.5000 mL | PREFILLED_SYRINGE | Freq: Once | INTRAMUSCULAR | Status: AC
  Administered 2022-02-09: 0.5 mL via INTRAMUSCULAR
  Filled 2022-02-09: qty 0.5

## 2022-02-09 NOTE — Progress Notes (Signed)
[place] OFFICE PROGRESS NOTE   Diagnosis: Colon cancer  INTERVAL HISTORY:   John Vance returns as scheduled.  He is accompanied by his son.  He feels well.  He denies pain.  Specifically no abdominal pain.  He has a good appetite and good energy level.  He denies bleeding.  Bowels moving regularly.  Objective:  Vital signs in last 24 hours:  Blood pressure (!) 140/65, pulse 67, temperature 98.2 F (36.8 C), temperature source Oral, resp. rate 18, height 5' 8" (1.727 m), weight 174 lb 9.6 oz (79.2 kg), SpO2 99 %.    HEENT: No thrush or ulcers. Lymphatics: No palpable cervical, supraclavicular or axillary lymph nodes. Resp: Lungs clear bilaterally. Cardio: Regular rate and rhythm. GI: Abdomen soft and nontender.  No hepatosplenomegaly.  No mass. Vascular: No leg edema. Port-A-Cath without erythema.  Lab Results:  Lab Results  Component Value Date   WBC 4.8 11/17/2021   HGB 12.4 (L) 11/17/2021   HCT 35.7 (L) 11/17/2021   MCV 100.6 (H) 11/17/2021   PLT 137 (L) 11/17/2021   NEUTROABS 2.9 11/17/2021    Imaging:  No results found.  Medications: I have reviewed the patient's current medications.  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), transverse colon cancer stage IIB (T4aN0) status post extended right hemicolectomy 05/07/2019, MSS Tumor invasive superficial portion of the muscle ("internal third "), no tumor perforation, no vascular invasion or perineural invasion, negative resection margins, 0/4 lymph nodes Elevated CEA 02/10/2019 CTs 03/05/2019, compared to outside CTs from 11/22/2018-worsened wall thickening in the cecum stable apple core lesion in the mid transverse colon, no evidence of recurrent tumor at the distal colonic anastomosis, no evidence of metastatic disease Colonoscopy 03/24/2019 20-2 malignant appearing masses noted in  the colon, 1 filled the cecum measuring approximately 4 cm across and friable.  The other malignant appearing mass was circumferential creating a stricture with a 1.2 cm lumen located in the transverse segment approximately 4 to 5 cm in length.  There were 12-18 neoplastic appearing polyps in between the 2 obvious malignant masses ranging in size from 3 mm to 2 cm.  5 polyps distal to the transverse colon mass.  2 sigmoid colon polyps.  Cecum mass positive for adenocarcinoma.  Transverse colon mass positive for adenocarcinoma. Foundation 1 transverse colon-MSS, tumor mutation burden 7, KRAS wild-type, BRAF fusion 05/07/2019 laparoscopic extended right hemicolectomy, lysis of adhesions-5 cm invasive moderately differentiated adenocarcinoma involving cecum and proximal ascending colon, carcinoma invades into the pericolonic soft tissue; 7 cm invasive moderately differentiated carcinoma involving the transverse colon, carcinoma invades into the serosal surface (visceral peritoneum); all resection margins negative for carcinoma.  Lymphovascular invasion present.  In the proximal colon metastatic carcinoma involves 3 of 17 lymph nodes with 1 tumor deposit.  In the transverse colon 12 lymph nodes negative for metastatic carcinoma.  3 separate polyps: Tubular adenoma, inflammatory polyp and hyperplastic polyp Cycle 1 capecitabine 06/02/2019 Cycle 2 capecitabine 06/23/2019 Cycle 3 capecitabine 07/14/2019 Cycle 4 capecitabine 08/04/2019 Cycle 5 capecitabine 08/25/2019 Cycle 6 capecitabine 09/15/2019 Cycle 7 capecitabine 10/06/2019 Cycle 8 capecitabine 10/27/2019 Upper endoscopy 07/23/2020-negative Colonoscopy 07/23/2020-normal-appearing anastomoses, polyp removed from the descending colon-tubular adenoma CTs 01/18/2021-right abdominal mesenteric mass, mass in the low anatomic pelvis consistent with metastases Cycle 1 FOLFOX 02/21/2021 Cycle 2 FOLFOX 03/07/2021, dose reductions made due to mild neutropenia Cycle 3 FOLFOX  03/21/2021 Cycle 4 FOLFOX 04/04/2021 Cycle 5 FOLFOX 04/19/2021  Cycle 6 FOLFOX 05/02/2021 CTs 05/12/2021-decrease size of nodular right mesenteric mass, complete resolution of soft tissue enhancing nodule in the right inguinal canal, resolution of nodular soft tissue mass posterior to the bladder, enlargement of a left upper lobe nodule and new 4 mm left upper lobe nodule-indeterminate Cycle 7 FOLFOX 05/16/2021 Cycle 8 FOLFOX 05/30/2021, oxaliplatin and 5-FU bolus held due to neuropathy and thrombocytopenia Cycle 9 FOLFOX 06/13/2021, oxaliplatin dose reduced Cycle 10 FOLFOX 06/27/2021 CT 07/07/2021-decrease in right mesenteric mass, decreased size of lobulated left upper lobe nodule, resolution of previous "new "left upper lobe nodule, new area of wall thickening at the distal transverse colon Treatment changed to Xeloda 2 weeks on/1 week off beginning 07/18/2021 Cycle 2 Xeloda beginning 08/09/2021 Cycle 3 Xeloda beginning 08/30/2021 Cycle 4 Xeloda beginning 09/21/2021 Cycle 5 Xeloda beginning 10/12/2021 Cycle 6 Xeloda beginning 11/02/2021 CTs 11/14/2021-right lower quadrant spiculated small bowel mesenteric mass measured 2.8 x 3.7 cm, previously 1.0 x 2.3 cm; adjacent mesenteric haziness and nodularity measuring up to 8 mm superiorly.  New 4 mm nodular lesion posterior segment right upper lobe along the major fissure. Treatment break beginning 11/17/2021 2.   Microcytic anemia secondary to #1.    Resolved 3.   Family history of pancreas and colon cancer, negative genetic testing per the [facility] panel, ATM variant of unknown significance 4.   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  There is no clinical evidence of disease progression.  We will follow-up on the CEA from today.  Plan to continue observation, repeat imaging in  approximately 6 weeks.  He will return for a follow-up visit 03/24/2022 to review CT results.  He will contact the office in the interim with any problems.  A Spanish interpreter was present throughout today's visit.  Ned Card ANP/GNP-BC   02/09/2022  8:39 AM

## 2022-02-09 NOTE — Patient Instructions (Signed)
Influenza Virus Vaccine injection Qu es este medicamento? La VACUNA CONTRA EL VIRUS DE LA INFLUENZA ayuda a reducir el riesgo de contraer la influenza, tambin conocida como gripe. La vacuna solo ayuda a protegerlo contra algunas cepas de la gripe. Este medicamento puede ser utilizado para otros usos; si tiene alguna pregunta consulte con su proveedor de atencin mdica o con su farmacutico. MARCAS COMUNES: Afluria, Afluria Quadrivalent, Agriflu, Alfuria, FLUAD, FLUAD Quadrivalent, Fluarix, Fluarix Quadrivalent, Flublok, Flublok Quadrivalent, FLUCELVAX, FLUCELVAX Quadrivalent, Flulaval, Flulaval Quadrivalent, Fluvirin, Fluzone, Fluzone High-Dose, Fluzone Intradermal, Fluzone Quadrivalent Qu le debo informar a mi profesional de la salud antes de tomar este medicamento? Necesitan saber si usted presenta alguno de los [employer] o situaciones: trastornos de sangrado, como la hemofilia fiebre o infeccin sndrome de Guillain-Barre u otras afecciones neurolgicas problemas del sistema inmunolgico infeccin con el virus de inmunodeficiencia humana (VIH) o SIDA cantidad baja de plaquetas en sangre esclerosis mltiple una reaccin alrgica o inusual a la vacuna contra el virus de la influenza, al ltex, a otros medicamentos, alimentos, colorantes o conservantes Distintas marcas comerciales de vacunas contienen distintos alrgenos. Algunas pueden contener ltex o huevos. Hable con su mdico sobre sus alergias para asegurarse de recibir la [place]. si est embarazada o buscando quedar embarazada si est amamantando a un beb Cmo debo utilizar este medicamento? Esta vacuna se administra mediante inyeccin en un msculo o debajo de la piel. La administra un profesional de [employer]	[place]. Recibir una copia de informacin escrita sobre la vacuna antes de cada vacuna. Asegrese de leer este folleto cada vez cuidadosamente. Este folleto puede cambiar con frecuencia. Hable con su proveedor de  atencin mdica para determinar qu vacunas son adecuadas para usted. Algunas vacunas no deben usarse en personas de cualquier edad. Sobredosis: Pngase en contacto inmediatamente con un centro toxicolgico o una sala de urgencia si usted cree que haya tomado demasiado medicamento. ATENCIN: [employer] es solo para usted. No comparta este medicamento con nadie. Qu sucede si me olvido de una dosis? No se aplica en este caso. Qu puede interactuar con este medicamento? quimioterapia o terapia de radiacin medicamentos que disminuyen su sistema inmunolgico, tales como etanercept, anakinra, infliximab y adalimumab medicamentos que tratan o previenen cogulos sanguneos, como warfarina fenitona medicamentos esteroideos, tales como la prednisona o la cortisona teofilina vacunas Puede ser que esta lista no menciona todas las posibles interacciones. Informe a su profesional de [employer]	[place] de [employer] productos a base de hierbas, medicamentos de [place] o suplementos nutritivos que est tomando. Si usted fuma, consume bebidas alcohlicas o si utiliza drogas ilegales, indqueselo tambin a su profesional de [employer]	[place]. Algunas sustancias pueden interactuar con su medicamento. A qu debo estar atento al usar [employer]? Informe a su mdico o a su profesional de Customer service manager secundario que no desaparece dentro de los 3 das. Llame a su proveedor de atencin mdica si tiene cualquier sntoma inusual dentro de las 6 semanas de recibir esta vacuna. Igualmente podr contraer gripe, pero la enfermedad por lo general no es tan grave. La vacuna no puede producir gripe. La vacuna no ofrece proteccin contra resfros u otras enfermedades que pueden causar fiebre. Es necesario aplicarse la vacuna todos los [place]. Qu efectos secundarios puedo tener al [employer] este medicamento? Efectos secundarios que debe informar a su mdico o a Barrister's clerk de la salud tan pronto como sea  posible: Chief of Staff, tales como erupcin cutnea, comezn/picazn o urticaria, e hinchazn de la cara, los labios o la  lengua Efectos secundarios que generalmente no requieren atencin mdica (infrmelos a su mdico o a Barrister's clerk de la salud si persisten o si son molestos): Water quality scientist de Curator, sensibilidad, enrojecimiento o Estate agent de la inyeccin cansancio Puede ser que esta lista no menciona todos los posibles efectos secundarios. Comunquese a su mdico por asesoramiento mdico [employer] Usted puede informar los efectos secundarios a la FDA por telfono al 1-800-FDA-1088. Dnde debo guardar mi medicina? Un profesional de Probation officer la vacuna en una [place], [place], consultorio mdico u otro entorno de atencin mdica. No le entregarn dosis de la vacuna para que las guarde en su casa. ATENCIN: Este folleto es un resumen. Puede ser que no cubra toda la posible informacin. Si usted tiene preguntas acerca de esta medicina, consulte con su mdico, su farmacutico o su profesional de Technical sales engineer.  2023 Elsevier/Gold Standard (2020-12-15 00:00:00)

## 2022-03-21 ENCOUNTER — Encounter (HOSPITAL_COMMUNITY): Payer: Self-pay | Admitting: Surgery

## 2022-03-21 ENCOUNTER — Ambulatory Visit (HOSPITAL_BASED_OUTPATIENT_CLINIC_OR_DEPARTMENT_OTHER)
Admission: RE | Admit: 2022-03-21 | Discharge: 2022-03-21 | Disposition: A | Discharge status: Home / Self Care | Payer: Commercial Managed Care - HMO | Source: Ambulatory Visit | Attending: Nurse Practitioner | Admitting: Nurse Practitioner

## 2022-03-21 ENCOUNTER — Inpatient Hospital Stay: Payer: Commercial Managed Care - HMO | Attending: Oncology

## 2022-03-21 ENCOUNTER — Inpatient Hospital Stay: Payer: Commercial Managed Care - HMO

## 2022-03-21 DIAGNOSIS — C189 Malignant neoplasm of colon, unspecified: Secondary | ICD-10-CM | POA: Insufficient documentation

## 2022-03-21 DIAGNOSIS — C188 Malignant neoplasm of overlapping sites of colon: Secondary | ICD-10-CM | POA: Diagnosis not present

## 2022-03-21 DIAGNOSIS — Z95828 Presence of other vascular implants and grafts: Secondary | ICD-10-CM

## 2022-03-21 LAB — BASIC METABOLIC PANEL - CANCER CENTER ONLY
Anion gap: 8 (ref 5–15)
BUN: 17 mg/dL (ref 8–23)
CO2: 26 mmol/L (ref 22–32)
Calcium: 9.2 mg/dL (ref 8.9–10.3)
Chloride: 107 mmol/L (ref 98–111)
Creatinine: 0.91 mg/dL (ref 0.61–1.24)
GFR, Estimated: >60 mL/min (ref >60)
Glucose, Bld: 96 mg/dL (ref 70–99)
Potassium: 4.1 mmol/L (ref 3.5–5.1)
Sodium: 141 mmol/L (ref 135–145)

## 2022-03-21 LAB — CEA (ACCESS): CEA (CHCC): 30.56 ng/mL — ABNORMAL HIGH (ref 0.00–5.00)

## 2022-03-21 LAB — POCT I-STAT CREATININE: Creatinine, Ser: 0.9 mg/dL (ref 0.61–1.24)

## 2022-03-21 MED ORDER — HEPARIN SOD (PORK) LOCK FLUSH 100 UNIT/ML IV SOLN
500.0000 [IU] | Freq: Once | INTRAVENOUS | Status: AC
  Administered 2022-03-21: 500 [IU] via INTRAVENOUS

## 2022-03-21 MED ORDER — IOHEXOL 300 MG/ML  SOLN
100.0000 mL | Freq: Once | INTRAMUSCULAR | Status: AC | PRN
  Administered 2022-03-21: 85 mL via INTRAVENOUS

## 2022-03-21 MED ORDER — SODIUM CHLORIDE 0.9% FLUSH
10.0000 mL | INTRAVENOUS | Status: DC | PRN
  Administered 2022-03-21: 10 mL via INTRAVENOUS

## 2022-03-21 NOTE — Patient Instructions (Signed)

## 2022-03-24 ENCOUNTER — Inpatient Hospital Stay: Payer: Commercial Managed Care - HMO | Attending: Oncology | Admitting: Oncology

## 2022-03-24 VITALS — BP 143/60 | HR 75 | Temp 98.2°F | Resp 20 | Ht 68.0 in | Wt 171.8 lb

## 2022-03-24 DIAGNOSIS — Z800 Family history of malignant neoplasm of digestive organs: Secondary | ICD-10-CM | POA: Insufficient documentation

## 2022-03-24 DIAGNOSIS — C188 Malignant neoplasm of overlapping sites of colon: Secondary | ICD-10-CM | POA: Insufficient documentation

## 2022-03-24 DIAGNOSIS — C189 Malignant neoplasm of colon, unspecified: Secondary | ICD-10-CM | POA: Diagnosis not present

## 2022-03-24 DIAGNOSIS — G620 Drug-induced polyneuropathy: Secondary | ICD-10-CM | POA: Insufficient documentation

## 2022-03-24 DIAGNOSIS — D509 Iron deficiency anemia, unspecified: Secondary | ICD-10-CM | POA: Insufficient documentation

## 2022-03-24 DIAGNOSIS — Z5111 Encounter for antineoplastic chemotherapy: Secondary | ICD-10-CM | POA: Diagnosis present

## 2022-03-24 DIAGNOSIS — C787 Secondary malignant neoplasm of liver and intrahepatic bile duct: Secondary | ICD-10-CM | POA: Insufficient documentation

## 2022-03-24 NOTE — Progress Notes (Signed)
DISCONTINUE ON PATHWAY REGIMEN - Colorectal     A cycle is every 14 days:     Oxaliplatin      Leucovorin      Fluorouracil      Fluorouracil   **Always confirm dose/schedule in your pharmacy ordering system**  REASON: Disease Progression PRIOR TREATMENT: [id number]: [id number] q14 Days TREATMENT RESPONSE: Partial Response (PR)  START ON PATHWAY REGIMEN - Colorectal     A cycle is every 14 days:     Bevacizumab-xxxx      Irinotecan      Leucovorin      Fluorouracil      Fluorouracil   **Always confirm dose/schedule in your pharmacy ordering system**  Patient Characteristics: Distant Metastases, Nonsurgical Candidate, KRAS/NRAS Wild-Type (BRAF V600 Wild-Type/Unknown), Standard Cytotoxic Therapy, Second Line Standard Cytotoxic Therapy, Bevacizumab Eligible Tumor Location: Colon Therapeutic Status: Distant Metastases Microsatellite/Mismatch Repair Status: MSS/pMMR BRAF Mutation Status: Wild-Type (no mutation) KRAS/NRAS Mutation Status: Wild-Type (no mutation) Preferred Therapy Approach: Standard Cytotoxic Therapy Standard Cytotoxic Line of Therapy: Second Line Standard Cytotoxic Therapy Bevacizumab Eligibility: Eligible Intent of Therapy: Non-Curative / Palliative Intent, Discussed with Patient

## 2022-03-24 NOTE — Progress Notes (Signed)
John Vance OFFICE PROGRESS NOTE   Diagnosis: Colon cancer  INTERVAL HISTORY:  John Vance returns as scheduled.  He feels well.  Good appetite.  No pain.  No difficulty with bowel function.  No complaint.  He is here today with his son and a Spanish interpreter.   Objective:  Vital signs in last 24 hours:  Blood pressure (!) 143/60, pulse 75, temperature 98.2 F (36.8 C), temperature source Oral, resp. rate 20, height _0  (1.727 m), weight 171 lb 12.8 oz (77.9 kg), SpO2 100 %.    Lymphatics: No cervical, supraclavicular, axillary, or inguinal nodes Resp: Lungs clear bilaterally Cardio: Regular rate and rhythm GI: No hepatosplenomegaly, no mass, nontender Vascular: No leg edema  Portacath/PICC-without erythema  Lab Results:  Lab Results  Component Value Date   WBC 4.8 11/17/2021   HGB 12.4 (L) 11/17/2021   HCT 35.7 (L) 11/17/2021   MCV 100.6 (H) 11/17/2021   PLT 137 (L) 11/17/2021   NEUTROABS 2.9 11/17/2021    CMP  Lab Results  Component Value Date   NA 141 03/21/2022   K 4.1 03/21/2022   CL 107 03/21/2022   CO2 26 03/21/2022   GLUCOSE 96 03/21/2022   BUN 17 03/21/2022   CREATININE 0.90 03/21/2022   CALCIUM 9.2 03/21/2022   PROT 6.4 (L) 11/17/2021   ALBUMIN 4.2 11/17/2021   AST 19 11/17/2021   ALT 10 11/17/2021   ALKPHOS 64 11/17/2021   BILITOT 0.9 11/17/2021   GFRNONAA >60 03/21/2022   GFRAA >60 12/01/2019    Lab Results  Component Value Date   CEA1 12.10 (H) 01/03/2021   CEA 30.56 (H) 03/21/2022    Lab Results  Component Value Date   INR 1.0 05/02/2019   LABPROT 13.2 05/02/2019    Imaging:  CT CHEST ABDOMEN PELVIS W CONTRAST  Result Date: 03/22/2022 CLINICAL DATA:  81 year old male with history of colon cancer. Currently undergoing chemotherapy. Follow-up study to assess for treatment response. * Tracking Code: BO * EXAM: CT CHEST, ABDOMEN, AND PELVIS WITH CONTRAST TECHNIQUE: Multidetector CT imaging of the chest,  abdomen and pelvis was performed following the standard protocol during bolus administration of intravenous contrast. RADIATION DOSE REDUCTION: This exam was performed according to the departmental dose-optimization program which includes automated exposure control, adjustment of the mA and/or kV according to patient size and/or use of iterative reconstruction technique. CONTRAST:  56m OMNIPAQUE IOHEXOL 300 MG/ML  SOLN COMPARISON:  CT of the chest, abdomen and pelvis 11/14/2021. PET-CT 01/27/2021. FINDINGS: CT CHEST FINDINGS Cardiovascular: Heart size is normal. There is no significant pericardial fluid, thickening or pericardial calcification. There is aortic atherosclerosis, as well as atherosclerosis of the great vessels of the mediastinum and the coronary arteries, including calcified atherosclerotic plaque in the left main, left anterior descending and right coronary arteries. Thickening and calcification of the aortic valve. Right internal jugular single-lumen Port-A-Cath with tip terminating in the right atrium. Mediastinum/Nodes: No pathologically enlarged mediastinal or hilar lymph nodes. Please note that accurate exclusion of hilar adenopathy is limited on noncontrast CT scans. Esophagus is unremarkable in appearance. No axillary lymphadenopathy. Lungs/Pleura: Calcified granuloma in the right middle lobe, unchanged. 4 mm right lower lobe pulmonary nodule at the base (axial image 98 of series 4), unchanged. No other suspicious appearing pulmonary nodules or masses are noted. No acute consolidative airspace disease. No pleural effusions. Musculoskeletal: Chronic appearing compression fracture of superior endplate of T5 with 200%loss of anterior vertebral body height, unchanged. Lucent areas in  the left side of the T1 and T3 vertebral bodies (sagittal image 76 of series 6), stable compared to numerous prior examinations and not hypermetabolic on prior PET-CT 24/23/5361. CT ABDOMEN PELVIS FINDINGS  Hepatobiliary: Numerous new hypovascular lesions scattered throughout the hepatic parenchyma, indicative of progressive metastatic disease to the liver, with the largest of these lesions in the superior aspect of the right lobe of the liver between segments 7 and 8 (axial image 40 of series 2) measuring 2.9 x 2.5 cm. No intra or extrahepatic biliary ductal dilatation. Tiny calcified gallstones are noted within the gallbladder lumen. Gallbladder is nearly completely decompressed, without wall thickening, surrounding pericholecystic fluid or adjacent inflammatory changes. Pancreas: No pancreatic mass. No pancreatic ductal dilatation. No pancreatic or peripancreatic fluid collections or inflammatory changes. Spleen: Unremarkable. Adrenals/Urinary Tract: Exophytic low-attenuation lesion in the posterolateral aspect of the upper pole of the left kidney measuring up to 9.2 cm in diameter, compatible with a large simple (Bosniak class 1) cysts. Other subcentimeter low-attenuation lesions in both kidneys, too small to definitively characterize, but statistically likely to represent tiny cysts (no imaging follow-up recommended). No hydroureteronephrosis. Urinary bladder is normal in appearance. Bilateral adrenal glands are normal in appearance. Stomach/Bowel: The appearance of the stomach is normal. There is no pathologic dilatation of small bowel or colon. Suture line in the sigmoid colon, without focal soft tissue prominence adjacent to the suture line to suggest locally recurrent disease. Status post right hemicolectomy. Vascular/Lymphatic: Extensive atherosclerosis throughout the abdominal and pelvic vasculature, without evidence of aneurysm or dissection. Large nodal mass or metastatic peritoneal implant again noted in the ileocolic mesentery (axial image 77 of series 2 and coronal image 67 of series 5), substantially increased in size compared to the prior study, currently measuring 7.0 x 4.7 x 5.8 cm. No other  definite lymphadenopathy noted in the abdomen or pelvis. Reproductive: Prostate gland and seminal vesicles are unremarkable in appearance. Other: Metastatic implant in the low anatomic pelvis anterior to the distal rectum (axial image 99 of series 2 and coronal image 41 of series 5), increased in size, currently measuring 3.2 x 1.7 x 2.5 cm (previously 1.2 x 1.5 x 1.5 cm when measured in a similar fashion on the prior examination). Slightly cephalad to this there is a additional smaller metastatic implant (axial image 96 of series 2) measuring 1.7 x 1.1 cm, which is new compared to the prior study, and in direct contact with the posterior wall of the urinary bladder, and the undersurface of the sigmoid colon. Musculoskeletal: There are no aggressive appearing lytic or blastic lesions noted in the visualized portions of the skeleton. IMPRESSION: 1. Today's study demonstrates progression of disease as demonstrated by enlarging metastatic implant and/or nodal mass in the ileocolic mesentery, enlarging metastatic peritoneal implants in the low anatomic pelvis, and new widespread metastatic disease throughout the liver. 2. No definitive findings to suggest metastatic disease in the thorax. 3. Aortic atherosclerosis, in addition to left main and 2 vessel coronary artery disease. 4. There are calcifications of the aortic valve. Echocardiographic correlation for evaluation of potential valvular dysfunction may be warranted if clinically indicated. 5. Cholelithiasis without evidence of acute cholecystitis. 6. Additional incidental findings, as above. Electronically Signed   By: Vinnie Langton M.D.   On: 03/22/2022 09:19    Medications: I have reviewed the patient's current medications.   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) status post extended right  hemicolectomy 05/07/2019, MSS Tumor invasive superficial portion of the muscle ("internal third "), no tumor perforation, no vascular invasion or perineural invasion, negative resection margins, 0/4 lymph nodes Elevated CEA 02/10/2019 CTs 03/05/2019, compared to outside CTs from 11/22/2018-worsened wall thickening in the cecum stable apple core lesion in the mid transverse colon, no evidence of recurrent tumor at the distal colonic anastomosis, no evidence of metastatic disease Colonoscopy 03/24/2019 20-2 malignant appearing masses noted in the colon, 1 filled the cecum measuring approximately 4 cm across and friable.  The other malignant appearing mass was circumferential creating a stricture with a 1.2 cm lumen located in the transverse segment approximately 4 to 5 cm in length.  There were 12-18 neoplastic appearing polyps in between the 2 obvious malignant masses ranging in size from 3 mm to 2 cm.  5 polyps distal to the transverse colon mass.  2 sigmoid colon polyps.  Cecum mass positive for adenocarcinoma.  Transverse colon mass positive for adenocarcinoma. Foundation 1 transverse colon-MSS, tumor mutation burden 7, KRAS wild-type, BRAF fusion 05/07/2019 laparoscopic extended right hemicolectomy, lysis of adhesions-5 cm invasive moderately differentiated adenocarcinoma involving cecum and proximal ascending colon, carcinoma invades into the pericolonic soft tissue; 7 cm invasive moderately differentiated carcinoma involving the transverse colon, carcinoma invades into the serosal surface (visceral peritoneum); all resection margins negative for carcinoma.  Lymphovascular invasion present.  In the proximal colon metastatic carcinoma involves 3 of 17 lymph nodes with 1 tumor deposit.  In the transverse colon 12 lymph nodes negative for metastatic carcinoma.  3 separate polyps: Tubular adenoma, inflammatory polyp and hyperplastic polyp Cycle 1 capecitabine 06/02/2019 Cycle 2 capecitabine 06/23/2019 Cycle 3  capecitabine 07/14/2019 Cycle 4 capecitabine 08/04/2019 Cycle 5 capecitabine 08/25/2019 Cycle 6 capecitabine 09/15/2019 Cycle 7 capecitabine 10/06/2019 Cycle 8 capecitabine 10/27/2019 Upper endoscopy 07/23/2020-negative Colonoscopy 07/23/2020-normal-appearing anastomoses, polyp removed from the descending colon-tubular adenoma CTs 01/18/2021-right abdominal mesenteric mass, mass in the low anatomic pelvis consistent with metastases Cycle 1 FOLFOX 02/21/2021 Cycle 2 FOLFOX 03/07/2021, dose reductions made due to mild neutropenia Cycle 3 FOLFOX 03/21/2021 Cycle 4 FOLFOX 04/04/2021 Cycle 5 FOLFOX 04/19/2021 Cycle 6 FOLFOX 05/02/2021 CTs 05/12/2021-decrease size of nodular right mesenteric mass, complete resolution of soft tissue enhancing nodule in the right inguinal canal, resolution of nodular soft tissue mass posterior to the bladder, enlargement of a left upper lobe nodule and new 4 mm left upper lobe nodule-indeterminate Cycle 7 FOLFOX 05/16/2021 Cycle 8 FOLFOX 05/30/2021, oxaliplatin and 5-FU bolus held due to neuropathy and thrombocytopenia Cycle 9 FOLFOX 06/13/2021, oxaliplatin dose reduced Cycle 10 FOLFOX 06/27/2021 CT 07/07/2021-decrease in right mesenteric mass, decreased size of lobulated left upper lobe nodule, resolution of previous "new "left upper lobe nodule, new area of wall thickening at the distal transverse colon Treatment changed to Xeloda 2 weeks on/1 week off beginning 07/18/2021 Cycle 2 Xeloda beginning 08/09/2021 Cycle 3 Xeloda beginning 08/30/2021 Cycle 4 Xeloda beginning 09/21/2021 Cycle 5 Xeloda beginning 10/12/2021 Cycle 6 Xeloda beginning 11/02/2021 CTs 11/14/2021-right lower quadrant spiculated small bowel mesenteric mass measured 2.8 x 3.7 cm, previously 1.0 x 2.3 cm; adjacent mesenteric haziness and nodularity measuring up to 8 mm superiorly.  New 4 mm nodular lesion posterior segment right upper lobe along the major fissure. Treatment break beginning 11/17/2021 CTs 03/21/2022-multiple  new liver metastases, enlarging implant in the right ileocolic mesentery, enlarging peritoneal implants in the low pelvis 2.   Microcytic anemia secondary to #1.    Resolved 3.   Family history of pancreas  and colon cancer, negative genetic testing per the [facility] panel, ATM variant of unknown significance 4.   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  The CEA is higher.  The restaging CTs reveal evidence of disease progression in the liver and abdomen/pelvis.  I discussed the CT findings and reviewed the images with [name] and his son. We discussed treatment options.  He understands no therapy will be curative.  I suspect the metastatic disease is related to the node positive right-sided colon cancer.  I recommend treatment with FOLFIRI/bevacizumab.  We reviewed potential toxicities associated with the FOLFIRI regimen including the chance of nausea/vomiting, mucositis, diarrhea, alopecia, hematologic toxicity, infection, and bleeding.  We discussed the acute and delayed diarrhea seen with irinotecan.  We discussed the allergic reaction, hypertension, bleeding, thromboembolic disease, bowel perforation, CNS toxicity, and nephrotoxicity associated with bevacizumab.  He agrees to proceed.  Mr Pearson Vance will be scheduled for cycle 1 FOLFIRI/Avastin on 03/29/2022.  He will return for an office visit and cycle 2 on 04/11/2022.  We will follow the CEA and a repeat CT as markers of disease response.  A chemotherapy plan was entered today.  Betsy Coder, MD  03/24/2022  10:09 AM

## 2022-03-25 ENCOUNTER — Other Ambulatory Visit: Payer: Self-pay

## 2022-03-26 ENCOUNTER — Other Ambulatory Visit: Payer: Self-pay | Admitting: Oncology

## 2022-03-29 ENCOUNTER — Inpatient Hospital Stay: Payer: Commercial Managed Care - HMO

## 2022-03-29 VITALS — BP 142/68 | HR 64 | Temp 98.1°F | Resp 18 | Ht 68.0 in | Wt 172.2 lb

## 2022-03-29 DIAGNOSIS — C189 Malignant neoplasm of colon, unspecified: Secondary | ICD-10-CM

## 2022-03-29 DIAGNOSIS — Z5111 Encounter for antineoplastic chemotherapy: Secondary | ICD-10-CM | POA: Diagnosis not present

## 2022-03-29 LAB — CBC WITH DIFFERENTIAL (CANCER CENTER ONLY)
Abs Immature Granulocytes: 0.02 10*3/uL (ref 0.00–0.07)
Basophils Absolute: 0 10*3/uL (ref 0.0–0.1)
Basophils Relative: 1 %
Eosinophils Absolute: 0.3 10*3/uL (ref 0.0–0.5)
Eosinophils Relative: 5 %
HCT: 42.8 % (ref 39.0–52.0)
Hemoglobin: 13.8 g/dL (ref 13.0–17.0)
Immature Granulocytes: 0 %
Lymphocytes Relative: 16 %
Lymphs Abs: 1.1 10*3/uL (ref 0.7–4.0)
MCH: 28.2 pg (ref 26.0–34.0)
MCHC: 32.2 g/dL (ref 30.0–36.0)
MCV: 87.3 fL (ref 80.0–100.0)
Monocytes Absolute: 0.8 10*3/uL (ref 0.1–1.0)
Monocytes Relative: 11 %
Neutro Abs: 4.8 10*3/uL (ref 1.7–7.7)
Neutrophils Relative %: 67 %
Platelet Count: 188 10*3/uL (ref 150–400)
RBC: 4.9 MIL/uL (ref 4.22–5.81)
RDW: 13.2 % (ref 11.5–15.5)
WBC Count: 7.1 10*3/uL (ref 4.0–10.5)
nRBC: 0 % (ref 0.0–0.2)

## 2022-03-29 LAB — CMP (CANCER CENTER ONLY)
ALT: 18 U/L (ref 0–44)
AST: 28 U/L (ref 15–41)
Albumin: 4.1 g/dL (ref 3.5–5.0)
Alkaline Phosphatase: 73 U/L (ref 38–126)
Anion gap: 9 (ref 5–15)
BUN: 17 mg/dL (ref 8–23)
CO2: 26 mmol/L (ref 22–32)
Calcium: 9.5 mg/dL (ref 8.9–10.3)
Chloride: 104 mmol/L (ref 98–111)
Creatinine: 0.89 mg/dL (ref 0.61–1.24)
GFR, Estimated: >60 mL/min (ref >60)
Glucose, Bld: 125 mg/dL — ABNORMAL HIGH (ref 70–99)
Potassium: 4.2 mmol/L (ref 3.5–5.1)
Sodium: 139 mmol/L (ref 135–145)
Total Bilirubin: 0.5 mg/dL (ref 0.3–1.2)
Total Protein: 7.2 g/dL (ref 6.5–8.1)

## 2022-03-29 LAB — TOTAL PROTEIN, URINE DIPSTICK: Protein, ur: NEGATIVE mg/dL

## 2022-03-29 MED ORDER — SODIUM CHLORIDE 0.9 % IV SOLN
300.0000 mg/m2 | Freq: Once | INTRAVENOUS | Status: AC
  Administered 2022-03-29: 580 mg via INTRAVENOUS
  Filled 2022-03-29: qty 29

## 2022-03-29 MED ORDER — PALONOSETRON HCL INJECTION 0.25 MG/5ML
0.2500 mg | Freq: Once | INTRAVENOUS | Status: AC
  Administered 2022-03-29: 0.25 mg via INTRAVENOUS
  Filled 2022-03-29: qty 5

## 2022-03-29 MED ORDER — SODIUM CHLORIDE 0.9 % IV SOLN
2000.0000 mg/m2 | INTRAVENOUS | Status: DC
  Administered 2022-03-29: 3850 mg via INTRAVENOUS
  Filled 2022-03-29: qty 77

## 2022-03-29 MED ORDER — ATROPINE SULFATE 1 MG/ML IV SOLN
0.5000 mg | Freq: Once | INTRAVENOUS | Status: AC | PRN
  Administered 2022-03-29: 0.5 mg via INTRAVENOUS
  Filled 2022-03-29: qty 1

## 2022-03-29 MED ORDER — SODIUM CHLORIDE 0.9 % IV SOLN: Freq: Once | INTRAVENOUS | Status: AC

## 2022-03-29 MED ORDER — FLUOROURACIL CHEMO INJECTION 2.5 GM/50ML
300.0000 mg/m2 | Freq: Once | INTRAVENOUS | Status: AC
  Administered 2022-03-29: 600 mg via INTRAVENOUS
  Filled 2022-03-29: qty 12

## 2022-03-29 MED ORDER — SODIUM CHLORIDE 0.9 % IV SOLN
5.0000 mg/kg | Freq: Once | INTRAVENOUS | Status: AC
  Administered 2022-03-29: 400 mg via INTRAVENOUS
  Filled 2022-03-29: qty 16

## 2022-03-29 MED ORDER — SODIUM CHLORIDE 0.9 % IV SOLN
180.0000 mg/m2 | Freq: Once | INTRAVENOUS | Status: AC
  Administered 2022-03-29: 340 mg via INTRAVENOUS
  Filled 2022-03-29: qty 15

## 2022-03-29 MED ORDER — SODIUM CHLORIDE 0.9 % IV SOLN
10.0000 mg | Freq: Once | INTRAVENOUS | Status: AC
  Administered 2022-03-29: 10 mg via INTRAVENOUS
  Filled 2022-03-29: qty 10

## 2022-03-29 NOTE — Patient Instructions (Addendum)
Instrucciones al darle de alta: Discharge Instructions Gracias por elegir al [facility] de Cncer de [place] para brindarle atencin mdica de oncologa y Music therapist.   Si usted tiene una cita de laboratorio con [place], por favor vaya directamente [place] y regstrese en el rea de Control and instrumentation engineer.   Use ropa cmoda y [place] para tener fcil acceso a las vas del Portacath (acceso venoso de Engineer, site duracin) o la lnea PICC (catter central colocado por va perifrica).   Nos esforzamos por ofrecerle tiempo de calidad con su proveedor. Es posible que tenga que volver a programar su cita si llega tarde (15 minutos o ms).  El llegar tarde le afecta a usted y a otros pacientes cuyas citas son posteriores a Merchandiser, retail.  Adems, si usted falta a tres o ms citas sin avisar a la oficina, puede ser retirado(a) de la clnica a discrecin del proveedor.      Para las solicitudes de renovacin de recetas, pida a su farmacia que se ponga en contacto con nuestra oficina y deje que transcurran 59 horas para que se complete el proceso de las renovaciones.    Hoy usted recibi los siguientes agentes de quimioterapia e/o inmunoterapia Bevacizumab-awwb (MVASI), Irinotecan (CAMPTOSAR), Leucovorin & Flourouracil (ADRUCIL).      Para ayudar a prevenir las nuseas y los vmitos despus de su tratamiento, le recomendamos que tome su medicamento para las nuseas segn las indicaciones.  LOS SNTOMAS QUE DEBEN COMUNICARSE INMEDIATAMENTE SE INDICAN A CONTINUACIN: *FIEBRE SUPERIOR A 100.4 F (38 C) O MS *ESCALOFROS O SUDORACIN *NUSEAS Y VMITOS QUE NO SE CONTROLAN CON EL MEDICAMENTO PARA LAS NUSEAS *DIFICULTAD INUSUAL PARA RESPIRAR  *MORETONES O HEMORRAGIAS NO HABITUALES *PROBLEMAS URINARIOS (dolor o ardor al Garment/textile technologist o frecuencia para Garment/textile technologist) *PROBLEMAS INTESTINALES (diarrea inusual, estreimiento, dolor cerca del ano) SENSIBILIDAD EN LA BOCA Y EN LA GARGANTA CON O SIN LA PRESENCIA DE LCERAS  (dolor de garganta, llagas en la boca o dolor de muelas/dientes) ERUPCIN, HINCHAZN O DOLORES INUSUALES FLUJO VAGINAL INUSUAL O PICAZN/RASQUIA    Los puntos marcados con un asterisco ( *) indican una posible emergencia y debe hacer un seguimiento tan pronto como le sea posible o vaya al Departamento de Emergencias si se le presenta algn problema.  Por favor, muestre la [place] DE ADVERTENCIA DE Windy Canny DE ADVERTENCIA DE Benay Spice al registrarse en [address] de Emergencias y a la enfermera de triaje.  Si tiene preguntas despus de su visita o necesita cancelar o volver a programar su cita, por favor pngase en contacto con Proctor  Dept: [phone]  y Nicholas instrucciones. Las horas de oficina son de 8:00 a.m. a 4:30 p.m. de lunes a viernes. Por favor, tenga en cuenta que los mensajes de voz que se dejan despus de las 4:00 p.m. posiblemente no se devolvern hasta el siguiente da de [place].  Cerramos los fines de semana y [employer]. En todo momento tiene acceso a una enfermera para preguntas urgentes. Por favor, llame al nmero principal de la clnica Dept: [phone] y [place] instrucciones.   Para cualquier pregunta que no sea de carcter urgente, tambin puede ponerse en contacto con su proveedor [employer]. Ahora ofrecemos visitas electrnicas para cualquier persona mayor de 18 aos que solicite atencin mdica en lnea para los sntomas que no sean urgentes. Para ms detalles vaya a [email or web address].   Tambin puede bajar la aplicacin de MyChart! Vaya a la tienda  de aplicaciones, busque "MyChart", abra la aplicacin, seleccione , e ingrese con su nombre de usuario y la contrasea de Pharmacist, community.  Las mscaras son opcionales en los centros de Hotel manager. Si desea que su equipo de cuidados mdicos use una [employer] atienden, por favor hgaselo saber al personal. Bethann Berkshire una  persona de apoyo que tenga por lo menos 16 aos para que le acompae a sus citas. Bevacizumab Injection Qu es este medicamento? El BEVACIZUMAB es un anticuerpo monoclonal. [place] para tratar muchos tipos de cncer. Este medicamento puede ser utilizado para otros usos; si tiene alguna pregunta consulte con su proveedor de atencin mdica o con su farmacutico. MARCAS COMUNES: Alymsys, Avastin, MVASI, Jacob Moores le debo informar a mi profesional de la salud antes de tomar este medicamento? Necesitan saber si usted presenta alguno de los [employer] o situaciones: diabetes enfermedad cardiaca presin sangunea alta antecedentes de tos con sangre quimioterapia previa con antraciclinas (p. ej.: doxorubicina, daunorubicina, epirubicina) terapia de radiacin en curso o reciente ciruga reciente o planes para realizarse una ciruga accidente cerebrovascular una reaccin alrgica o inusual al bevacizumab, a las protenas de Production designer, theatre/television/film, a las protenas de ratn, a otros medicamentos, alimentos, Scientist, water quality o conservantes si est embarazada o buscando quedar embarazada si est amamantando a un beb Cmo debo [employer]? Este medicamento se administra mediante infusin en una vena. Lo administra un profesional de Technical sales engineer en un hospital o en un entorno clnico. Hable con su pediatra para informarse acerca del uso de este medicamento en nios. Puede requerir atencin especial. Sobredosis: Pngase en contacto inmediatamente con un centro toxicolgico o una sala de urgencia si usted cree que haya tomado demasiado medicamento. ATENCIN: [employer] es solo para usted. No comparta este medicamento con nadie. Qu sucede si me olvido de una dosis? Es importante no olvidar ninguna dosis. Informe a su mdico o a su profesional de la salud si no puede asistir a Photographer. Qu puede interactuar con este medicamento? No se anticipan interacciones. Puede ser que esta lista no  menciona todas las posibles interacciones. Informe a su profesional de KB Home	[place] de [employer] Corporation productos a base de hierbas, medicamentos de [place] o suplementos nutritivos que est tomando. Si usted fuma, consume bebidas alcohlicas o si utiliza drogas ilegales, indqueselo tambin a su profesional de KB Home	[place]. Algunas sustancias pueden interactuar con su medicamento. A qu debo estar atento al usar Coca-Cola? Se supervisar su estado de salud atentamente mientras reciba este medicamento. Tendr que hacerse anlisis de sangre importantes y [employer] de [place] mientras est tomando este medicamento. Este medicamento podra aumentar el riesgo de moretones o sangrado. Consulte a su mdico o a su profesional de la salud si observa sangrados inusuales. Antes de realizarse una ciruga, hable con su proveedor de atencin mdica para asegurarse de que no hay ningn problema. Este frmaco puede aumentar el riesgo de que el sitio o la herida quirrgica no sanen correctamente. Deber dejar de usar este frmaco durante [address] antes de la [place]. Despus de la ciruga, espere al menos [address] antes de reiniciar el uso de este frmaco. Asegrese de que el sitio o la herida quirrgica haya sanado lo suficiente antes de reiniciar el uso del frmaco. Hable con su proveedor de atencin mdica si tiene alguna pregunta. No debe quedar embarazada mientras est usando este medicamento o por 6 meses despus de dejar de usarlo. Las mujeres deben informar a su mdico si estn buscando quedar embarazadas o  si creen que podran estar embarazadas. Existe la posibilidad de efectos secundarios graves en un beb sin nacer. Para obtener ms informacin, hable con su profesional de la salud o su farmacutico. No debe Economist a un beb mientras est usando este medicamento y durante 6 meses despus de la ltima dosis. Este medicamento ha causado insuficiencia ovrica en [employer]. Este medicamento puede interferir con la  capacidad de tener hijos. Usted debe hablar con su mdico o su profesional de la salud si est preocupado por su fertilidad. Qu efectos secundarios puedo tener al [employer] este medicamento? Efectos secundarios que debe informar a su mdico o a Barrister's clerk de la salud tan pronto como sea posible: Chief of Staff, tales como erupcin cutnea, comezn/picazn o urticaria, e hinchazn de la cara, los labios o Advertising account planner u opresin en el pecho escalofros tos con sangre fiebre alta convulsiones estreimiento grave signos y sntomas de sangrado, tales como heces con sangre o de color negro y aspecto alquitranado; orina de color rojo o marrn oscuro; escupir sangre o material marrn que tiene el aspecto de posos (residuos) de caf; Tree surgeon rojas en la piel; sangrado o moretones inusuales en los ojos, las encas o la nariz signos y sntomas de un cogulo sanguneo, tales como problemas respiratorios; Social research officer, government en el pecho; dolor de cabeza grave, repentino; dolor, hinchazn, calor en la pierna signos y sntomas de un accidente cerebrovascular, tales como cambios en la visin; confusin; dificultad para hablar o entender; dolores de cabeza intensos; entumecimiento o debilidad repentina de la cara, el brazo o la pierna; problemas al Writer; Chief of Staff; prdida de equilibrio o coordinacin dolor estomacal sudoracin hinchazn de piernas o tobillos vmito aumento de peso Efectos secundarios que generalmente no requieren atencin mdica (infrmelos a su mdico o a Barrister's clerk de la salud si persisten o si son molestos): dolor de espalda cambios en el sentido del gusto disminucin del apetito piel seca nuseas cansancio Puede ser que esta lista no menciona todos los posibles efectos secundarios. Comunquese a su mdico por asesoramiento mdico Humana Inc. Usted puede informar los efectos secundarios a la FDA por telfono al 1-800-FDA-1088. Dnde debo guardar mi  medicina? Este medicamento se administra en hospitales o clnicas, y no necesitar guardarlo en su domicilio. ATENCIN: Este folleto es un resumen. Puede ser que no cubra toda la posible informacin. Si usted tiene preguntas acerca de esta medicina, consulte con su mdico, su farmacutico o su profesional de Technical sales engineer.  2023 Elsevier/Gold Standard (2020-11-10 00:00:00) Irinotecan Injection Qu es este medicamento? El IRINOTECN es un agente quimioteraputico. Se utiliza en el tratamiento del cncer de colon y rectal. Este medicamento puede ser utilizado para otros usos; si tiene alguna pregunta consulte con su proveedor de atencin mdica o con su farmacutico. MARCAS COMUNES: Camptosar Qu le debo informar a mi profesional de la salud antes de tomar este medicamento? Necesitan saber si usted presenta alguno de los siguientes problemas o situaciones: deshidratacin diarrea infeccin (especialmente infecciones virales, como varicela, fuegos labiales o herpes) enfermedad heptica recuentos sanguneos bajos, como baja cantidad de glbulos blancos, plaquetas o glbulos rojos niveles bajos de calcio, magnesio o potasio en la sangre terapia de radiacin en curso o reciente una reaccin alrgica o inusual al irinotecn, a otros medicamentos, alimentos, colorantes o conservantes si est embarazada o buscando quedar embarazada si est amamantando a un beb Cmo debo utilizar este medicamento? Este medicamento se administra como infusin en una vena. Un profesional de la salud especialmente capacitado lo administra en  un hospital o clnica. Hable con su pediatra para informarse acerca del uso de este medicamento en nios. Puede requerir atencin especial. Sobredosis: Pngase en contacto inmediatamente con un centro toxicolgico o una sala de urgencia si usted cree que haya tomado demasiado medicamento. ATENCIN: [employer] es solo para usted. No comparta este medicamento con nadie. Qu  sucede si me olvido de una dosis? Es importante no olvidar ninguna dosis. Informe a su mdico o a su profesional de la salud si no puede asistir a Photographer. Qu puede interactuar con este medicamento? No use este medicamento con ninguno de los siguientes frmacos: cobicistat itraconazol Este medicamento podra interactuar con los siguientes frmacos: medicamentos antivirales para el VIH o SIDA ciertos antibiticos, tales como rifampicina o rifabutina ciertos medicamentos para infecciones micticas, tales como ketoconazol, posaconazol y voriconazol ciertos medicamentos para convulsiones, tales como [place], fenobarbital y fenitona claritromicina gemfibrozil nefazodona hierba de [place] Puede ser que esta lista no menciona todas las posibles interacciones. Informe a su profesional de KB Home	[place] de [employer] Corporation productos a base de hierbas, medicamentos de [place] o suplementos nutritivos que est tomando. Si usted fuma, consume bebidas alcohlicas o si utiliza drogas ilegales, indqueselo tambin a su profesional de KB Home	[place]. Algunas sustancias pueden interactuar con su medicamento. A qu debo estar atento al usar Coca-Cola? Se supervisar su estado de salud atentamente mientras reciba este medicamento. Tendr que hacerse anlisis de sangre importantes mientras est usando este medicamento. Este medicamento podra hacerle sentir un Nurse, mental health. Esto es normal, ya que la quimioterapia puede afectar tanto a las clulas sanas como a las clulas cancerosas. Si presenta algn efecto secundario, infrmelo. Contine con el tratamiento aun si se siente enfermo, a menos que su mdico le indique que lo suspenda. En algunos casos, podra recibir [employer]ands para ayudarlo con los efectos secundarios. Siga todas las instrucciones para usarlos. Puede experimentar somnolencia o mareos. No conduzca, no utilice maquinaria ni haga nada que Associate Professor en estado de alerta  hasta que sepa cmo le afecta este medicamento. No se siente ni se ponga de pie con rapidez, especialmente si es un paciente de edad avanzada. Esto reduce el riesgo de mareos o [employer]. Consulte a su profesional de la salud si tiene fiebre, escalofros, dolor de garganta o cualquier otro sntoma de resfro o gripe. No se trate usted mismo. Este medicamento reduce la capacidad del cuerpo para combatir infecciones. Trate de no acercarse a personas que estn enfermas. Evite usar productos que contienen aspirina, acetaminofeno, ibuprofeno, naproxeno o ketoprofeno, a menos que as lo indique su mdico. Estos productos pueden ocultar la fiebre. Este medicamento podra aumentar el riesgo de moretones o sangrado. Consulte a su mdico o a su profesional de la salud si observa sangrados inusuales. Proceda con cuidado al cepillar sus dientes, usar hilo dental o Risk manager palillos para los dientes, ya que podra contraer una infeccin o Therapist, art con mayor facilidad. Si se somete a algn tratamiento dental, informe a su dentista que est [employer]. No debe quedar embarazada mientras est usando este medicamento o por 6 meses despus de dejar de usarlo. Las mujeres deben informar a su profesional de la salud si estn buscando quedar embarazadas o si creen que podran estar embarazadas. Los hombres no deben Social worker a Social research officer, government estn recibiendo Coca-Cola y [place] 3 meses despus de dejar de usarlo. Existe la posibilidad de que ocurran efectos secundarios graves en un beb sin nacer. Para obtener ms informacin, hable con  su profesional [place]. No debe amamantar a un beb mientras est tomando Coca-Cola o por 7 das despus de dejar de usarlo. Este medicamento ha causado insuficiencia ovrica en [employer]. Este medicamento puede causar dificultades para [place]. Hable con su profesional de la salud si le preocupa su fertilidad. Este medicamento ha causado recuentos  de esperma reducidos en algunos hombres. Esto puede hacer ms difcil que un hombre embarace a Musician. Hable con su profesional de la salud si le preocupa su fertilidad. Qu efectos secundarios puedo tener al [employer] este medicamento? Efectos secundarios que debe informar a su mdico o a Barrister's clerk de la salud tan pronto como sea posible: Chief of Staff, tales como erupcin cutnea, comezn/picazn o urticaria, e hinchazn de la cara, los labios o la Holiday representative en el pecho diarrea enrojecimiento, goteo nasal, sudoracin durante la infusin recuentos sanguneos bajos: este medicamento podra reducir la cantidad de glbulos blancos, glbulos rojos y plaquetas. Su riesgo de infeccin y sangrado podra ser mayor. nuseas, vmito dolor, hinchazn, calor en la pierna signos de disminucin en la cantidad de plaquetas o sangrado: moretones, puntos rojos en la piel, heces de color negro y aspecto alquitranado, sangre en la orina signos de infeccin: fiebre o escalofros, tos, dolor de Investment banker, operational, Social research officer, government o dificultad para orinar signos de disminucin en la cantidad de glbulos rojos: debilidad o cansancio inusuales, desmayos, aturdimiento Efectos secundarios que generalmente no requieren Geophysical data processor (infrmelos a su mdico o a Barrister's clerk de la salud si persisten o si son molestos): estreimiento cada del cabello dolor de cabeza prdida del apetito llagas en la boca dolor estomacal Puede ser que esta lista no menciona todos los posibles efectos secundarios. Comunquese a su mdico por asesoramiento mdico [employer] Inc. Usted puede informar los efectos secundarios a la FDA por telfono al 1-800-FDA-1088. Dnde debo guardar mi medicina? Este medicamento se administra en hospitales o clnicas, y no necesitar guardarlo en su domicilio. ATENCIN: Este folleto es un resumen. Puede ser que no cubra toda la posible informacin. Si usted tiene preguntas acerca de esta  medicina, consulte con su mdico, su farmacutico o su profesional de Technical sales engineer.  2023 Elsevier/Gold Standard (2019-08-14 00:00:00) Leucovorin Injection Qu es este medicamento? La LEUCOVORINA se [place] para prevenir o tratar [employer] nocivos de ciertos medicamentos. Este medicamento tambin sirve para tratar la anemia provocada por una nivel bajo de cido flico en el cuerpo. Tambin se puede administrar con 5-fluorouracilo (5-FU), para tratar el cncer de colon. Este medicamento puede ser utilizado para otros usos; si tiene alguna pregunta consulte con su proveedor de atencin mdica o con su farmacutico. Qu le debo informar a mi profesional de la salud antes de tomar este medicamento? Necesita saber si usted presenta alguno de los siguientes problemas o situaciones: anemia debido a bajos niveles de vitamina B-12 en la sangre una reaccin alrgica o inusual a la leucovorina, cido flico, a otros medicamentos, alimentos, colorantes o conservantes si est embarazada o buscando quedar embarazada si est amamantando a un beb Cmo debo utilizar este medicamento? Este medicamento se administra mediante inyeccin por va intramuscular o intravenosa. Lo administra un profesional de Technical sales engineer en un hospital o en un entorno clnico. Hable con su pediatra para informarse acerca del uso de este medicamento en nios. Puede requerir atencin especial. Sobredosis: Pngase en contacto inmediatamente con un centro toxicolgico o una sala de urgencia si usted cree que haya tomado demasiado medicamento. ATENCIN: [employer] es solo para usted.  No comparta este medicamento con nadie. Qu sucede si me olvido de una dosis? No se aplica en este caso. Qu puede interactuar con este medicamento? capecitabina fluorouracilo fenobarbital fenitona primidona trimetoprima- sulfametoxasol Puede ser que esta lista no menciona todas las posibles interacciones. Informe a su profesional de KB Home	[place] de  [employer] Corporation productos a base de hierbas, medicamentos de [place] o suplementos nutritivos que est tomando. Si usted fuma, consume bebidas alcohlicas o si utiliza drogas ilegales, indqueselo tambin a su profesional de KB Home	[place]. Algunas sustancias pueden interactuar con su medicamento. A qu debo estar atento al usar Coca-Cola? Se supervisar su estado de salud atentamente mientras reciba este medicamento. Este medicamento puede aumentar los efectos secundarios de 5-fluorouracilo, 5-FU. Si tiene diarrea o [employer] boca que no mejoran o que [place], consulte a su mdico o a su profesional de KB Home	[place]. Qu efectos secundarios puedo tener al [employer] Corporation este medicamento? Efectos secundarios que debe informar a su mdico o a Barrister's clerk de la salud tan pronto como sea posible: Chief of Staff como erupcin cutnea, picazn o urticarias, hinchazn de la cara, labios o lengua problemas respiratorios fiebre, infeccin llagas en la boca sangrado, magulladuras inusuales cansancio o debilidad inusual Efectos secundarios que, por lo general, no requieren atencin mdica (debe informarlos a su mdico o a su profesional de la salud si persisten o si son molestos): estreimiento o diarrea prdida del apetito nuseas, vmito Puede ser que esta lista no menciona todos los posibles efectos secundarios. Comunquese a su mdico por asesoramiento mdico [employer]ana Inc. Usted puede informar los efectos secundarios a la FDA por telfono al 1-800-FDA-1088. Dnde debo guardar mi medicina? Este medicamento se administra en hospitales o clnicas y no necesitar guardarlo en su domicilio. ATENCIN: Este folleto es un resumen. Puede ser que no cubra toda la posible informacin. Si usted tiene preguntas acerca de esta medicina, consulte con su mdico, su farmacutico o su profesional de Technical sales engineer.  2023 Elsevier/Gold Standard (2014-06-02 00:00:00) Fluorouracil Injection Qu es este  medicamento? El [place], 5-FU es un agente quimioteraputico. Desacelera el crecimiento de clulas cancerosas. Este medicamento se [place] para tratar muchos tipos de cncer, tales como cncer de mama, cncer de colon o rectal, cncer de pncreas y cncer de estmago. Este medicamento puede ser utilizado para otros usos; si tiene alguna pregunta consulte con su proveedor de atencin mdica o con su farmacutico. MARCAS COMUNES: Adrucil Qu le debo informar a mi profesional de la salud antes de tomar este medicamento? Necesitan saber si usted presenta alguno de los siguientes problemas o situaciones: trastornos sanguneos deficiencia de dihidropirimidina deshidrogenasa (DPD) infeccin (especialmente infecciones virales, como varicela, fuegos labiales o herpes) enfermedad renal enfermedad heptica desnutricin, nutricin deficiente terapia de radiacin en curso o reciente una reaccin alrgica o inusual al fluorouracilo, a otros medicamentos quimioteraputicos, a otros medicamentos, alimentos, colorantes o conservantes si est embarazada o buscando quedar embarazada si est amamantando a un beb Cmo debo utilizar este medicamento? Este medicamento se administra como infusin o inyeccin en una vena. Un profesional de la salud especialmente capacitado lo administra en un hospital o clnica. Hable con su pediatra para informarse acerca del uso de este medicamento en nios. Puede requerir atencin especial. Sobredosis: Pngase en contacto inmediatamente con un centro toxicolgico o una sala de urgencia si usted cree que haya tomado demasiado medicamento. ATENCIN: [employer] es solo para usted. No comparta este medicamento con nadie. Qu sucede si me olvido de una dosis? Es importante no olvidar  ninguna dosis. Informe a su mdico o a su profesional de la salud si no puede asistir a Photographer. Qu puede interactuar con este medicamento? No use este medicamento con ninguno de los  siguientes frmacos: vacunas de virus vivos Este medicamento tambin puede interactuar con los siguientes frmacos: medicamentos que tratan o previenen cogulos sanguneos, tales como warfarina, enoxaparina y dalteparina Puede ser que esta lista no menciona todas las posibles interacciones. Informe a su profesional de KB Home	[place] de [employer] Corporation productos a base de hierbas, medicamentos de [place] o suplementos nutritivos que est tomando. Si usted fuma, consume bebidas alcohlicas o si utiliza drogas ilegales, indqueselo tambin a su profesional de KB Home	[place]. Algunas sustancias pueden interactuar con su medicamento. A qu debo estar atento al usar Coca-Cola? Visite a su mdico para que revise su evolucin. Este medicamento podra hacerle sentir un Nurse, mental health. Esto es normal, ya que la quimioterapia puede afectar tanto a las clulas sanas como a las clulas cancerosas. Si presenta algn efecto secundario, infrmelo. Contine con el tratamiento aun si se siente enfermo, a menos que su mdico le indique que lo suspenda. En algunos casos, podra recibir [employer] para ayudarlo con los efectos secundarios. Siga todas las instrucciones para usarlos. Llame a su mdico o a su profesional de la salud si tiene fiebre, escalofros o dolor de garganta, o cualquier otro sntoma de resfro o gripe. No se trate usted mismo. Este medicamento reduce la capacidad del cuerpo para combatir infecciones. Trate de no acercarse a personas que estn enfermas. Este medicamento podra aumentar el riesgo de moretones o sangrado. Consulte a su mdico o a su profesional de la salud si observa sangrados inusuales. Proceda con cuidado al cepillar sus dientes, usar hilo dental o Risk manager palillos para los dientes, ya que podra contraer una infeccin o Therapist, art con mayor facilidad. Si se somete a algn tratamiento dental, informe a su dentista que est [employer]. Evite usar productos que contienen  aspirina, acetaminofeno, ibuprofeno, naproxeno o ketoprofeno, a menos que as lo indique su mdico. Estos productos pueden ocultar la fiebre. No debe quedar embarazada mientras est [employer]. Las mujeres deben informar a su mdico si estn buscando quedar embarazadas o si creen que podran estar embarazadas. Existe la posibilidad de efectos secundarios graves en un beb sin nacer. Para obtener ms informacin, hable con su profesional de la salud o su farmacutico. No debe Economist a un beb mientras est usando este medicamento. Los hombres deben informar a su mdico si quieren tener hijos. Este medicamento puede reducir el recuento de esperma. No trate la diarrea con productos de [employer]. Contacte a su mdico si tiene diarrea por ms de 2 das, o si es grave y [place]. Este medicamento puede aumentar su sensibilidad al sol. Evite la [employer]. Si no la Product manager, utilice ropa protectora y crema de Photographer. No utilice lmparas solares, camas solares ni cabinas solares. Qu efectos secundarios puedo tener al [employer] este medicamento? Efectos secundarios que debe informar a su mdico o a Barrister's clerk de la salud tan pronto como sea posible: Chief of Staff, tales como erupcin cutnea, comezn/picazn o urticaria, e hinchazn de la cara, los labios o la lengua recuentos sanguneos bajos: este medicamento podra reducir la cantidad de glbulos blancos, glbulos rojos y plaquetas. Su riesgo de infeccin y sangrado podra ser mayor. signos de infeccin: fiebre o escalofros, tos, dolor de garganta, dolor o dificultad para orinar signos de disminucin en la cantidad de plaquetas  o sangrado: moretones, puntos rojos en la piel, heces de color negro y aspecto alquitranado, sangre en la orina signos de disminucin en la cantidad de glbulos rojos: debilidad o cansancio inusuales, Youth worker, aturdimiento problemas para respirar cambios en la visin Market researcher  en la boca nuseas y vmito dolor, hinchazn, enrojecimiento en TEFL teacher de Air cabin crew, hormigueo o entumecimiento de las manos o los pies enrojecimiento, hinchazn o Pension scheme manager en las manos o los pies dolor estomacal sangrado inusual Efectos secundarios que generalmente no requieren atencin mdica (infrmelos a su mdico o a su profesional de la salud si persisten o si son molestos): cambios en las uas de los pies o de las manos diarrea comezn/picazn o sequedad de la piel cada del cabello dolor de cabeza prdida del apetito sensibilidad de los ojos a la luz Tree surgeon estomacal ojos inusualmente llorosos Puede ser que esta lista no menciona todos los posibles efectos secundarios. Comunquese a su mdico por asesoramiento mdico [employer] Usted puede informar los efectos secundarios a la FDA por telfono al 1-800-FDA-1088. Dnde debo guardar mi medicina? Este medicamento se administra en hospitales o clnicas, y no necesitar guardarlo en su domicilio. ATENCIN: Este folleto es un resumen. Puede ser que no cubra toda la posible informacin. Si usted tiene preguntas acerca de esta medicina, consulte con su mdico, su farmacutico o su profesional de Technical sales engineer.  2023 Elsevier/Gold Standard (2019-08-14 00:00:00) The chemotherapy medication bag should finish at 46 hours, 96 hours, or 7 days. For example, if your pump is scheduled for 46 hours and it was put on at 4:00 p.m., it should finish at 2:00 p.m. the day it is scheduled to come off regardless of your appointment time.     Estimated time to finish at 10:30 a.m. on Friday 03/31/2022.   If the display on your pump reads "Low Volume" and it is beeping, take the batteries out of the pump and come to the cancer center for it to be taken off.   If the pump alarms go off prior to the pump reading "Low Volume" then call [phone] and someone can assist you.  If the plunger comes out and the chemotherapy  medication is leaking out, please use your home chemo spill kit to clean up the spill. Do NOT use paper towels or other household products.  If you have problems or questions regarding your pump, please call either [phone] (24 hours a day) or the cancer center Monday-Friday 8:00 a.m.- 4:30 p.m. at the clinic number and we will assist you. If you are unable to get assistance, then go to the nearest Emergency Department and ask the staff to contact the IV team for assistance.

## 2022-03-30 ENCOUNTER — Telehealth: Payer: Self-pay

## 2022-03-30 NOTE — Telephone Encounter (Signed)
[name]  Telephone call to patient post First Time Bevacizumab/FOLFIRI. Unable to reach patient. Left voice message.

## 2022-03-31 ENCOUNTER — Inpatient Hospital Stay: Payer: Commercial Managed Care - HMO

## 2022-03-31 VITALS — BP 168/63 | HR 63 | Temp 97.7°F | Resp 18

## 2022-03-31 DIAGNOSIS — C189 Malignant neoplasm of colon, unspecified: Secondary | ICD-10-CM

## 2022-03-31 DIAGNOSIS — Z5111 Encounter for antineoplastic chemotherapy: Secondary | ICD-10-CM | POA: Diagnosis not present

## 2022-03-31 MED ORDER — SODIUM CHLORIDE 0.9% FLUSH
10.0000 mL | INTRAVENOUS | Status: DC | PRN
  Administered 2022-03-31: 10 mL

## 2022-03-31 MED ORDER — HEPARIN SOD (PORK) LOCK FLUSH 100 UNIT/ML IV SOLN
500.0000 [IU] | Freq: Once | INTRAVENOUS | Status: AC | PRN
  Administered 2022-03-31: 500 [IU]

## 2022-03-31 NOTE — Patient Instructions (Signed)

## 2022-03-31 NOTE — Progress Notes (Signed)
Cheral Marker Interpreter from [place] at chairside for visit.

## 2022-04-11 ENCOUNTER — Inpatient Hospital Stay: Payer: Commercial Managed Care - HMO

## 2022-04-11 ENCOUNTER — Encounter: Payer: Self-pay | Admitting: Nurse Practitioner

## 2022-04-11 ENCOUNTER — Inpatient Hospital Stay: Payer: Commercial Managed Care - HMO | Admitting: Nurse Practitioner

## 2022-04-11 ENCOUNTER — Other Ambulatory Visit: Payer: Self-pay | Admitting: Nurse Practitioner

## 2022-04-11 VITALS — BP 144/68 | HR 62

## 2022-04-11 VITALS — BP 142/68 | HR 77 | Temp 98.1°F | Resp 18 | Ht 68.0 in | Wt 172.8 lb

## 2022-04-11 DIAGNOSIS — Z5111 Encounter for antineoplastic chemotherapy: Secondary | ICD-10-CM | POA: Diagnosis not present

## 2022-04-11 DIAGNOSIS — C189 Malignant neoplasm of colon, unspecified: Secondary | ICD-10-CM | POA: Diagnosis not present

## 2022-04-11 LAB — CMP (CANCER CENTER ONLY)
ALT: 14 U/L (ref 0–44)
AST: 20 U/L (ref 15–41)
Albumin: 4.2 g/dL (ref 3.5–5.0)
Alkaline Phosphatase: 87 U/L (ref 38–126)
Anion gap: 7 (ref 5–15)
BUN: 16 mg/dL (ref 8–23)
CO2: 26 mmol/L (ref 22–32)
Calcium: 9.4 mg/dL (ref 8.9–10.3)
Chloride: 105 mmol/L (ref 98–111)
Creatinine: 1.03 mg/dL (ref 0.61–1.24)
GFR, Estimated: >60 mL/min (ref >60)
Glucose, Bld: 116 mg/dL — ABNORMAL HIGH (ref 70–99)
Potassium: 4.1 mmol/L (ref 3.5–5.1)
Sodium: 138 mmol/L (ref 135–145)
Total Bilirubin: 0.5 mg/dL (ref 0.3–1.2)
Total Protein: 7.3 g/dL (ref 6.5–8.1)

## 2022-04-11 LAB — CBC WITH DIFFERENTIAL (CANCER CENTER ONLY)
Abs Immature Granulocytes: 0.01 10*3/uL (ref 0.00–0.07)
Basophils Absolute: 0 10*3/uL (ref 0.0–0.1)
Basophils Relative: 1 %
Eosinophils Absolute: 0.2 10*3/uL (ref 0.0–0.5)
Eosinophils Relative: 5 %
HCT: 40.6 % (ref 39.0–52.0)
Hemoglobin: 13.3 g/dL (ref 13.0–17.0)
Immature Granulocytes: 0 %
Lymphocytes Relative: 29 %
Lymphs Abs: 1.2 10*3/uL (ref 0.7–4.0)
MCH: 28.1 pg (ref 26.0–34.0)
MCHC: 32.8 g/dL (ref 30.0–36.0)
MCV: 85.7 fL (ref 80.0–100.0)
Monocytes Absolute: 0.6 10*3/uL (ref 0.1–1.0)
Monocytes Relative: 16 %
Neutro Abs: 2 10*3/uL (ref 1.7–7.7)
Neutrophils Relative %: 49 %
Platelet Count: 175 10*3/uL (ref 150–400)
RBC: 4.74 MIL/uL (ref 4.22–5.81)
RDW: 13.1 % (ref 11.5–15.5)
WBC Count: 4 10*3/uL (ref 4.0–10.5)
nRBC: 0 % (ref 0.0–0.2)

## 2022-04-11 MED ORDER — FLUOROURACIL CHEMO INJECTION 2.5 GM/50ML
300.0000 mg/m2 | Freq: Once | INTRAVENOUS | Status: AC
  Administered 2022-04-11: 600 mg via INTRAVENOUS
  Filled 2022-04-11: qty 12

## 2022-04-11 MED ORDER — SODIUM CHLORIDE 0.9 % IV SOLN
5.0000 mg/kg | Freq: Once | INTRAVENOUS | Status: AC
  Administered 2022-04-11: 400 mg via INTRAVENOUS
  Filled 2022-04-11: qty 16

## 2022-04-11 MED ORDER — ATROPINE SULFATE 1 MG/ML IV SOLN
0.5000 mg | Freq: Once | INTRAVENOUS | Status: AC | PRN
  Administered 2022-04-11: 0.5 mg via INTRAVENOUS
  Filled 2022-04-11: qty 1

## 2022-04-11 MED ORDER — SODIUM CHLORIDE 0.9 % IV SOLN
180.0000 mg/m2 | Freq: Once | INTRAVENOUS | Status: AC
  Administered 2022-04-11: 340 mg via INTRAVENOUS
  Filled 2022-04-11: qty 2

## 2022-04-11 MED ORDER — SODIUM CHLORIDE 0.9 % IV SOLN
2000.0000 mg/m2 | INTRAVENOUS | Status: DC
  Administered 2022-04-11: 3850 mg via INTRAVENOUS
  Filled 2022-04-11: qty 77

## 2022-04-11 MED ORDER — SODIUM CHLORIDE 0.9 % IV SOLN: Freq: Once | INTRAVENOUS | Status: AC

## 2022-04-11 MED ORDER — SODIUM CHLORIDE 0.9 % IV SOLN
10.0000 mg | Freq: Once | INTRAVENOUS | Status: AC
  Administered 2022-04-11: 10 mg via INTRAVENOUS
  Filled 2022-04-11: qty 10

## 2022-04-11 MED ORDER — LIDOCAINE-PRILOCAINE 2.5-2.5 % EX CREA: 1.0000 | TOPICAL_CREAM | CUTANEOUS | 2 refills | Status: DC | PRN

## 2022-04-11 MED ORDER — PROCHLORPERAZINE MALEATE 10 MG PO TABS: 10.0000 mg | ORAL_TABLET | Freq: Four times a day (QID) | ORAL | 3 refills | Status: DC | PRN

## 2022-04-11 MED ORDER — PALONOSETRON HCL INJECTION 0.25 MG/5ML
0.2500 mg | Freq: Once | INTRAVENOUS | Status: AC
  Administered 2022-04-11: 0.25 mg via INTRAVENOUS
  Filled 2022-04-11: qty 5

## 2022-04-11 MED ORDER — SODIUM CHLORIDE 0.9 % IV SOLN
300.0000 mg/m2 | Freq: Once | INTRAVENOUS | Status: AC
  Administered 2022-04-11: 580 mg via INTRAVENOUS
  Filled 2022-04-11: qty 29

## 2022-04-11 NOTE — Patient Instructions (Addendum)
Instrucciones al darle de alta:  The chemotherapy medication bag should finish at 46 hours, 96 hours, or 7 days. For example, if your pump is scheduled for 46 hours and it was put on at 4:00 p.m., it should finish at 2:00 p.m. the day it is scheduled to come off regardless of your appointment time.     Estimated time to finish at 10:45 Wednesday, April 13, 2022.   If the display on your pump reads "Low Volume" and it is beeping, take the batteries out of the pump and come to the cancer center for it to be taken off.   If the pump alarms go off prior to the pump reading "Low Volume" then call [phone] and someone can assist you.  If the plunger comes out and the chemotherapy medication is leaking out, please use your home chemo spill kit to clean up the spill. Do NOT use paper towels or other household products.  If you have problems or questions regarding your pump, please call either [phone] (24 hours a day) or the cancer center Monday-Friday 8:00 a.m.- 4:30 p.m. at the clinic number and we will assist you. If you are unable to get assistance, then go to the nearest Emergency Department and ask the staff to contact the IV team for assistance.  Discharge Instructions Gracias por elegir al [facility] de Cncer de [place] para brindarle atencin mdica de oncologa y Music therapist.   Si usted tiene una cita de laboratorio con [place], por favor vaya directamente Reeder y regstrese en el rea de Control and instrumentation engineer.   Use ropa cmoda y [place] para tener fcil acceso a las vas del Portacath (acceso venoso de Engineer, site duracin) o la lnea PICC (catter central colocado por va perifrica).   Nos esforzamos por ofrecerle tiempo de calidad con su proveedor. Es posible que tenga que volver a programar su cita si llega tarde (15 minutos o ms).  El llegar tarde le afecta a usted y a otros pacientes cuyas citas son posteriores a Merchandiser, retail.  Adems, si usted falta a tres o ms  citas sin avisar a la oficina, puede ser retirado(a) de la clnica a discrecin del proveedor.      Para las solicitudes de renovacin de recetas, pida a su farmacia que se ponga en contacto con nuestra oficina y deje que transcurran 81 horas para que se complete el proceso de las renovaciones.    Hoy usted recibi los siguientes agentes de quimioterapia e/o inmunoterapia Bevacizumab, Irinotecan, Leucovorin, Fluorouracil      Para ayudar a prevenir las nuseas y los vmitos despus de su tratamiento, le recomendamos que tome su medicamento para las nuseas segn las indicaciones.  LOS SNTOMAS QUE DEBEN COMUNICARSE INMEDIATAMENTE SE INDICAN A CONTINUACIN: *FIEBRE SUPERIOR A 100.4 F (38 C) O MS *ESCALOFROS O SUDORACIN *NUSEAS Y VMITOS QUE NO SE CONTROLAN CON EL MEDICAMENTO PARA LAS NUSEAS *DIFICULTAD INUSUAL PARA RESPIRAR  *MORETONES O HEMORRAGIAS NO HABITUALES *PROBLEMAS URINARIOS (dolor o ardor al Garment/textile technologist o frecuencia para Garment/textile technologist) *PROBLEMAS INTESTINALES (diarrea inusual, estreimiento, dolor cerca del ano) SENSIBILIDAD EN LA BOCA Y EN LA GARGANTA CON O SIN LA PRESENCIA DE LCERAS (dolor de garganta, llagas en la boca o dolor de muelas/dientes) ERUPCIN, HINCHAZN O DOLORES INUSUALES FLUJO VAGINAL INUSUAL O PICAZN/RASQUIA    Los puntos marcados con un asterisco ( *) indican una posible emergencia y debe hacer un seguimiento tan pronto como le sea posible o vaya al Departamento de Emergencias si se  le presenta algn problema.  Por favor, muestre la [place] DE ADVERTENCIA DE Windy Canny DE ADVERTENCIA DE Benay Spice al registrarse en [address] de Emergencias y a la enfermera de triaje.  Si tiene preguntas despus de su visita o necesita cancelar o volver a programar su cita, por favor pngase en contacto con [place]  Dept: [phone]  y [place] instrucciones. Las horas de oficina son de 8:00 a.m. a 4:30 p.m. de lunes a viernes.  Por favor, tenga en cuenta que los mensajes de voz que se dejan despus de las 4:00 p.m. posiblemente no se devolvern hasta el siguiente da de [place].  Cerramos los fines de semana y [employer]. En todo momento tiene acceso a una enfermera para preguntas urgentes. Por favor, llame al nmero principal de la clnica Dept: [phone] y [place] instrucciones.   Para cualquier pregunta que no sea de carcter urgente, tambin puede ponerse en contacto con su proveedor [employer] Ahora ofrecemos visitas electrnicas para cualquier persona mayor de 18 aos que solicite atencin mdica en lnea para los sntomas que no sean urgentes. Para ms detalles vaya a [email or web address].   Tambin puede bajar la aplicacin de MyChart! Vaya a la tienda de aplicaciones, busque "MyChart", abra la aplicacin, seleccione [employer], e ingrese con su nombre de usuario y la contrasea de Pharmacist, community.  Las mscaras son opcionales en los centros de Hotel manager. Si desea que su equipo de cuidados mdicos use una [employer] atienden, por favor hgaselo saber al personal. Bethann Berkshire una persona de apoyo que tenga por lo menos 16 aos para que le acompae a sus citas.  23-04-21 00:00:00) Bevacizumab Injection Qu es este medicamento? El BEVACIZUMAB es un anticuerpo monoclonal. Se [place] para tratar muchos tipos de cncer. Este medicamento puede ser utilizado para otros usos; si tiene alguna pregunta consulte con su proveedor de atencin mdica o con su farmacutico. MARCAS COMUNES: Alymsys, Avastin, MVASI, Jacob Moores le debo informar a mi profesional de la salud antes de tomar este medicamento? Necesitan saber si usted presenta alguno de los [employer] o situaciones: diabetes enfermedad cardiaca presin sangunea alta antecedentes de tos con sangre quimioterapia previa con antraciclinas (p. ej.: doxorubicina, daunorubicina, epirubicina) terapia de radiacin en curso o  reciente ciruga reciente o planes para realizarse una ciruga accidente cerebrovascular una reaccin alrgica o inusual al bevacizumab, a las protenas de Production designer, theatre/television/film, a las protenas de ratn, a otros medicamentos, alimentos, Scientist, water quality o conservantes si est embarazada o buscando quedar embarazada si est amamantando a un beb Cmo debo [employer]? Este medicamento se administra mediante infusin en una vena. Lo administra un profesional de Technical sales engineer en un hospital o en un entorno clnico. Hable con su pediatra para informarse acerca del uso de este medicamento en nios. Puede requerir atencin especial. Sobredosis: Pngase en contacto inmediatamente con un centro toxicolgico o una sala de urgencia si usted cree que haya tomado demasiado medicamento. ATENCIN: [employer] es solo para usted. No comparta este medicamento con nadie. Qu sucede si me olvido de una dosis? Es importante no olvidar ninguna dosis. Informe a su mdico o a su profesional de la salud si no puede asistir a Photographer. Qu puede interactuar con este medicamento? No se anticipan interacciones. Puede ser que esta lista no menciona todas las posibles interacciones. Informe a su profesional de KB Home	[place] de [employer] Corporation productos a base de hierbas, medicamentos de venta libre o suplementos nutritivos que  est tomando. Si usted fuma, consume bebidas alcohlicas o si utiliza drogas ilegales, indqueselo tambin a su profesional de KB Home	[place]. Algunas sustancias pueden interactuar con su medicamento. A qu debo estar atento al usar Coca-Cola? Se supervisar su estado de salud atentamente mientras reciba este medicamento. Tendr que hacerse anlisis de sangre importantes y [employer] de [place] mientras est tomando este medicamento. Este medicamento podra aumentar el riesgo de moretones o sangrado. Consulte a su mdico o a su profesional de la salud si observa sangrados inusuales. Antes de realizarse una ciruga,  hable con su proveedor de atencin mdica para asegurarse de que no hay ningn problema. Este frmaco puede aumentar el riesgo de que el sitio o la herida quirrgica no sanen correctamente. Deber dejar de usar este frmaco durante [address] antes de la [place]. Despus de la ciruga, espere al menos [address] antes de reiniciar el uso de este frmaco. Asegrese de que el sitio o la herida quirrgica haya sanado lo suficiente antes de reiniciar el uso del frmaco. Hable con su proveedor de atencin mdica si tiene alguna pregunta. No debe quedar embarazada mientras est usando este medicamento o por 6 meses despus de dejar de usarlo. Las mujeres deben informar a su mdico si estn buscando quedar embarazadas o si creen que podran estar embarazadas. Existe la posibilidad de efectos secundarios graves en un beb sin nacer. Para obtener ms informacin, hable con su profesional de la salud o su farmacutico. No debe Economist a un beb mientras est usando este medicamento y durante 6 meses despus de la ltima dosis. Este medicamento ha causado insuficiencia ovrica en [employer]. Este medicamento puede interferir con la capacidad de tener hijos. Usted debe hablar con su mdico o su profesional de la salud si est preocupado por su fertilidad. Qu efectos secundarios puedo tener al [employer] este medicamento? Efectos secundarios que debe informar a su mdico o a Barrister's clerk de la salud tan pronto como sea posible: Chief of Staff, tales como erupcin cutnea, comezn/picazn o urticaria, e hinchazn de la cara, los labios o Advertising account planner u opresin en el pecho escalofros tos con sangre fiebre alta convulsiones estreimiento grave signos y sntomas de sangrado, tales como heces con sangre o de color negro y aspecto alquitranado; orina de color rojo o marrn oscuro; escupir sangre o material marrn que tiene el aspecto de posos (residuos) de caf; Tree surgeon rojas en la piel; sangrado o moretones  inusuales en los ojos, las encas o la nariz signos y sntomas de un cogulo sanguneo, tales como problemas respiratorios; Social research officer, government en el pecho; dolor de cabeza grave, repentino; dolor, hinchazn, calor en la pierna signos y sntomas de un accidente cerebrovascular, tales como cambios en la visin; confusin; dificultad para hablar o entender; dolores de cabeza intensos; entumecimiento o debilidad repentina de la cara, el brazo o la pierna; problemas al Writer; Chief of Staff; prdida de equilibrio o coordinacin dolor estomacal sudoracin hinchazn de piernas o tobillos vmito aumento de peso Efectos secundarios que generalmente no requieren atencin mdica (infrmelos a su mdico o a Barrister's clerk de la salud si persisten o si son molestos): dolor de espalda cambios en el sentido del gusto disminucin del apetito piel seca nuseas cansancio Puede ser que esta lista no menciona todos los posibles efectos secundarios. Comunquese a su mdico por asesoramiento mdico [employer]ana Inc. Usted puede informar los efectos secundarios a la FDA por telfono al 1-800-FDA-1088. Dnde debo guardar mi medicina? Este medicamento se administra en hospitales o clnicas, y  no necesitar guardarlo en su domicilio. ATENCIN: Este folleto es un resumen. Puede ser que no cubra toda la posible informacin. Si usted tiene preguntas acerca de esta medicina, consulte con su mdico, su farmacutico o su profesional de Technical sales engineer.  2023 Elsevier/Gold Standard (2020-11-10 00:00:00)  Irinotecan Injection Qu es este medicamento? El IRINOTECN es un agente quimioteraputico. Se utiliza en el tratamiento del cncer de colon y rectal. Este medicamento puede ser utilizado para otros usos; si tiene alguna pregunta consulte con su proveedor de atencin mdica o con su farmacutico. MARCAS COMUNES: Camptosar Qu le debo informar a mi profesional de la salud antes de tomar este medicamento? Necesitan saber si usted  presenta alguno de los siguientes problemas o situaciones: deshidratacin diarrea infeccin (especialmente infecciones virales, como varicela, fuegos labiales o herpes) enfermedad heptica recuentos sanguneos bajos, como baja cantidad de glbulos blancos, plaquetas o glbulos rojos niveles bajos de calcio, magnesio o potasio en la sangre terapia de radiacin en curso o reciente una reaccin alrgica o inusual al irinotecn, a otros medicamentos, alimentos, colorantes o conservantes si est embarazada o buscando quedar embarazada si est amamantando a un beb Cmo debo utilizar este medicamento? Este medicamento se administra como infusin en una vena. Un profesional de la salud especialmente capacitado lo administra en un hospital o clnica. Hable con su pediatra para informarse acerca del uso de este medicamento en nios. Puede requerir atencin especial. Sobredosis: Pngase en contacto inmediatamente con un centro toxicolgico o una sala de urgencia si usted cree que haya tomado demasiado medicamento. ATENCIN: [employer] es solo para usted. No comparta este medicamento con nadie. Qu sucede si me olvido de una dosis? Es importante no olvidar ninguna dosis. Informe a su mdico o a su profesional de la salud si no puede asistir a Photographer. Qu puede interactuar con este medicamento? No use este medicamento con ninguno de los siguientes frmacos: cobicistat itraconazol Este medicamento podra interactuar con los siguientes frmacos: medicamentos antivirales para el VIH o SIDA ciertos antibiticos, tales como rifampicina o rifabutina ciertos medicamentos para infecciones micticas, tales como ketoconazol, posaconazol y voriconazol ciertos medicamentos para convulsiones, tales como [place], fenobarbital y fenitona claritromicina gemfibrozil nefazodona hierba de [place] Puede ser que esta lista no menciona todas las posibles interacciones. Informe a su profesional de [employer] de [employer] Corporation productos a base de hierbas, medicamentos de [place] o suplementos nutritivos que est tomando. Si usted fuma, consume bebidas alcohlicas o si utiliza drogas ilegales, indqueselo tambin a su profesional de KB Home	[place]. Algunas sustancias pueden interactuar con su medicamento. A qu debo estar atento al usar Coca-Cola? Se supervisar su estado de salud atentamente mientras reciba este medicamento. Tendr que hacerse anlisis de sangre importantes mientras est usando este medicamento. Este medicamento podra hacerle sentir un Nurse, mental health. Esto es normal, ya que la quimioterapia puede afectar tanto a las clulas sanas como a las clulas cancerosas. Si presenta algn efecto secundario, infrmelo. Contine con el tratamiento aun si se siente enfermo, a menos que su mdico le indique que lo suspenda. En algunos casos, podra recibir [employer]ands para ayudarlo con los efectos secundarios. Siga todas las instrucciones para usarlos. Puede experimentar somnolencia o mareos. No conduzca, no utilice maquinaria ni haga nada que Associate Professor en estado de alerta hasta que sepa cmo le afecta este medicamento. No se siente ni se ponga de pie con rapidez, especialmente si es un paciente de edad avanzada. Esto reduce el riesgo de mareos o [employer]. Consulte a  su profesional de la salud si tiene fiebre, escalofros, dolor de garganta o cualquier otro sntoma de resfro o gripe. No se trate usted mismo. Este medicamento reduce la capacidad del cuerpo para combatir infecciones. Trate de no acercarse a personas que estn enfermas. Evite usar productos que contienen aspirina, acetaminofeno, ibuprofeno, naproxeno o ketoprofeno, a menos que as lo indique su mdico. Estos productos pueden ocultar la fiebre. Este medicamento podra aumentar el riesgo de moretones o sangrado. Consulte a su mdico o a su profesional de la salud si observa sangrados inusuales. Proceda con cuidado  al cepillar sus dientes, usar hilo dental o Risk manager palillos para los dientes, ya que podra contraer una infeccin o Therapist, art con mayor facilidad. Si se somete a algn tratamiento dental, informe a su dentista que est [employer]. No debe quedar embarazada mientras est usando este medicamento o por 6 meses despus de dejar de usarlo. Las mujeres deben informar a su profesional de la salud si estn buscando quedar embarazadas o si creen que podran estar embarazadas. Los hombres no deben Social worker a Social research officer, government estn recibiendo Coca-Cola y [place] 3 meses despus de dejar de usarlo. Existe la posibilidad de que ocurran efectos secundarios graves en un beb sin nacer. Para obtener ms informacin, hable con su profesional de KB Home	[place]. No debe amamantar a un beb mientras est tomando Coca-Cola o por 7 das despus de dejar de usarlo. Este medicamento ha causado insuficiencia ovrica en [employer]. Este medicamento puede causar dificultades para [place]. Hable con su profesional de la salud si le preocupa su fertilidad. Este medicamento ha causado recuentos de esperma reducidos en algunos hombres. Esto puede hacer ms difcil que un hombre embarace a Musician. Hable con su profesional de la salud si le preocupa su fertilidad. Qu efectos secundarios puedo tener al [employer] este medicamento? Efectos secundarios que debe informar a su mdico o a Barrister's clerk de la salud tan pronto como sea posible: Chief of Staff, tales como erupcin cutnea, comezn/picazn o urticaria, e hinchazn de la cara, los labios o la Holiday representative en el pecho diarrea enrojecimiento, goteo nasal, sudoracin durante la infusin recuentos sanguneos bajos: este medicamento podra reducir la cantidad de glbulos blancos, glbulos rojos y plaquetas. Su riesgo de infeccin y sangrado podra ser mayor. nuseas, vmito dolor, hinchazn, calor en la pierna signos de disminucin en  la cantidad de plaquetas o sangrado: moretones, puntos rojos en la piel, heces de color negro y aspecto alquitranado, sangre en la orina signos de infeccin: fiebre o escalofros, tos, dolor de Investment banker, operational, Social research officer, government o dificultad para orinar signos de disminucin en la cantidad de glbulos rojos: debilidad o cansancio inusuales, desmayos, aturdimiento Efectos secundarios que generalmente no requieren Geophysical data processor (infrmelos a su mdico o a Barrister's clerk de la salud si persisten o si son molestos): estreimiento cada del cabello dolor de cabeza prdida del apetito llagas en la boca dolor estomacal Puede ser que esta lista no menciona todos los posibles efectos secundarios. Comunquese a su mdico por asesoramiento mdico [employer] Inc. Usted puede informar los efectos secundarios a la FDA por telfono al 1-800-FDA-1088. Dnde debo guardar mi medicina? Este medicamento se administra en hospitales o clnicas, y no necesitar guardarlo en su domicilio. ATENCIN: Este folleto es un resumen. Puede ser que no cubra toda la posible informacin. Si usted tiene preguntas acerca de esta medicina, consulte con su mdico, su farmacutico o su profesional de Technical sales engineer.  2023 Elsevier/Gold Standard (2019-08-14 00:00:00)  Leucovorin  Injection Qu es este medicamento? La LEUCOVORINA se [place] para prevenir o tratar [employer] nocivos de ciertos medicamentos. Este medicamento tambin sirve para tratar la anemia provocada por una nivel bajo de cido flico en el cuerpo. Tambin se puede administrar con 5-fluorouracilo (5-FU), para tratar el cncer de colon. Este medicamento puede ser utilizado para otros usos; si tiene alguna pregunta consulte con su proveedor de atencin mdica o con su farmacutico. Qu le debo informar a mi profesional de la salud antes de tomar este medicamento? Necesita saber si usted presenta alguno de los siguientes problemas o situaciones: anemia debido a bajos niveles  de vitamina B-12 en la sangre una reaccin alrgica o inusual a la leucovorina, cido flico, a otros medicamentos, alimentos, colorantes o conservantes si est embarazada o buscando quedar embarazada si est amamantando a un beb Cmo debo utilizar este medicamento? Este medicamento se administra mediante inyeccin por va intramuscular o intravenosa. Lo administra un profesional de Technical sales engineer en un hospital o en un entorno clnico. Hable con su pediatra para informarse acerca del uso de este medicamento en nios. Puede requerir atencin especial. Sobredosis: Pngase en contacto inmediatamente con un centro toxicolgico o una sala de urgencia si usted cree que haya tomado demasiado medicamento. ATENCIN: [employer] es solo para usted. No comparta este medicamento con nadie. Qu sucede si me olvido de una dosis? No se aplica en este caso. Qu puede interactuar con este medicamento? capecitabina fluorouracilo fenobarbital fenitona primidona trimetoprima- sulfametoxasol Puede ser que esta lista no menciona todas las posibles interacciones. Informe a su profesional de KB Home	[place] de [employer] Corporation productos a base de hierbas, medicamentos de [place] o suplementos nutritivos que est tomando. Si usted fuma, consume bebidas alcohlicas o si utiliza drogas ilegales, indqueselo tambin a su profesional de KB Home	[place]. Algunas sustancias pueden interactuar con su medicamento. A qu debo estar atento al usar Coca-Cola? Se supervisar su estado de salud atentamente mientras reciba este medicamento. Este medicamento puede aumentar los efectos secundarios de 5-fluorouracilo, 5-FU. Si tiene diarrea o [employer] boca que no mejoran o que [place], consulte a su mdico o a su profesional de KB Home	[place]. Qu efectos secundarios puedo tener al [employer] Corporation este medicamento? Efectos secundarios que debe informar a su mdico o a Barrister's clerk de la salud tan pronto como sea posible: Chief of Staff  como erupcin cutnea, picazn o urticarias, hinchazn de la cara, labios o lengua problemas respiratorios fiebre, infeccin llagas en la boca sangrado, magulladuras inusuales cansancio o debilidad inusual Efectos secundarios que, por lo general, no requieren atencin mdica (debe informarlos a su mdico o a su profesional de la salud si persisten o si son molestos): estreimiento o diarrea prdida del apetito nuseas, vmito Puede ser que esta lista no menciona todos los posibles efectos secundarios. Comunquese a su mdico por asesoramiento mdico [employer] Inc. Usted puede informar los efectos secundarios a la FDA por telfono al 1-800-FDA-1088. Dnde debo guardar mi medicina? Este medicamento se administra en hospitales o clnicas y no necesitar guardarlo en su domicilio. ATENCIN: Este folleto es un resumen. Puede ser que no cubra toda la posible informacin. Si usted tiene preguntas acerca de esta medicina, consulte con su mdico, su farmacutico o su profesional de Technical sales engineer. Fluorouracil Injection Qu es este medicamento? El Peerless, 5-FU es un agente quimioteraputico. Desacelera el crecimiento de clulas cancerosas. Este medicamento se [place] para tratar muchos tipos de cncer, tales como cncer de mama, cncer de colon o rectal, cncer de pncreas  y cncer de estmago. Este medicamento puede ser utilizado para otros usos; si tiene alguna pregunta consulte con su proveedor de atencin mdica o con su farmacutico. MARCAS COMUNES: Adrucil Qu le debo informar a mi profesional de la salud antes de tomar este medicamento? Necesitan saber si usted presenta alguno de los siguientes problemas o situaciones: trastornos sanguneos deficiencia de dihidropirimidina deshidrogenasa (DPD) infeccin (especialmente infecciones virales, como varicela, fuegos labiales o herpes) enfermedad renal enfermedad heptica desnutricin, nutricin deficiente terapia de radiacin en  curso o reciente una reaccin alrgica o inusual al fluorouracilo, a otros medicamentos quimioteraputicos, a otros medicamentos, alimentos, colorantes o conservantes si est embarazada o buscando quedar embarazada si est amamantando a un beb Cmo debo utilizar este medicamento? Este medicamento se administra como infusin o inyeccin en una vena. Un profesional de la salud especialmente capacitado lo administra en un hospital o clnica. Hable con su pediatra para informarse acerca del uso de este medicamento en nios. Puede requerir atencin especial. Sobredosis: Pngase en contacto inmediatamente con un centro toxicolgico o una sala de urgencia si usted cree que haya tomado demasiado medicamento. ATENCIN: [employer] es solo para usted. No comparta este medicamento con nadie. Qu sucede si me olvido de una dosis? Es importante no olvidar ninguna dosis. Informe a su mdico o a su profesional de la salud si no puede asistir a Photographer. Qu puede interactuar con este medicamento? No use este medicamento con ninguno de los siguientes frmacos: vacunas de virus vivos Este medicamento tambin puede interactuar con los siguientes frmacos: medicamentos que tratan o previenen cogulos sanguneos, tales como warfarina, enoxaparina y dalteparina Puede ser que esta lista no menciona todas las posibles interacciones. Informe a su profesional de [employer]	[place] de [employer] productos a base de hierbas, medicamentos de Grace o suplementos nutritivos que est tomando. Si usted fuma, consume bebidas alcohlicas o si utiliza drogas ilegales, indqueselo tambin a su profesional de [employer]	[place]. Algunas sustancias pueden interactuar con su medicamento. A qu debo estar atento al usar [employer]? Visite a su mdico para que revise su evolucin. Este medicamento podra hacerle sentir un Nurse, mental health. Esto es normal, ya que la quimioterapia puede afectar tanto a las clulas sanas como a las clulas  cancerosas. Si presenta algn efecto secundario, infrmelo. Contine con el tratamiento aun si se siente enfermo, a menos que su mdico le indique que lo suspenda. En algunos casos, podra recibir [employer] para ayudarlo con los efectos secundarios. Siga todas las instrucciones para usarlos. Llame a su mdico o a su profesional de la salud si tiene fiebre, escalofros o dolor de garganta, o cualquier otro sntoma de resfro o gripe. No se trate usted mismo. Este medicamento reduce la capacidad del cuerpo para combatir infecciones. Trate de no acercarse a personas que estn enfermas. Este medicamento podra aumentar el riesgo de moretones o sangrado. Consulte a su mdico o a su profesional de la salud si observa sangrados inusuales. Proceda con cuidado al cepillar sus dientes, usar hilo dental o Risk manager palillos para los dientes, ya que podra contraer una infeccin o Therapist, art con mayor facilidad. Si se somete a algn tratamiento dental, informe a su dentista que est [employer]. Evite usar productos que contienen aspirina, acetaminofeno, ibuprofeno, naproxeno o ketoprofeno, a menos que as lo indique su mdico. Estos productos pueden ocultar la fiebre. No debe quedar embarazada mientras est [employer]. Las mujeres deben informar a su mdico si estn buscando quedar embarazadas o si creen que podran estar  embarazadas. Existe la posibilidad de efectos secundarios graves en un beb sin nacer. Para obtener ms informacin, hable con su profesional de la salud o su farmacutico. No debe Economist a un beb mientras est usando este medicamento. Los hombres deben informar a su mdico si quieren tener hijos. Este medicamento puede reducir el recuento de esperma. No trate la diarrea con productos de [employer]. Contacte a su mdico si tiene diarrea por ms de 2 das, o si es grave y [place]. Este medicamento puede aumentar su sensibilidad al sol. Evite la [employer]. Si no  la Product manager, utilice ropa protectora y crema de Photographer. No utilice lmparas solares, camas solares ni cabinas solares. Qu efectos secundarios puedo tener al [employer] este medicamento? Efectos secundarios que debe informar a su mdico o a Barrister's clerk de la salud tan pronto como sea posible: Chief of Staff, tales como erupcin cutnea, comezn/picazn o urticaria, e hinchazn de la cara, los labios o la lengua recuentos sanguneos bajos: este medicamento podra reducir la cantidad de glbulos blancos, glbulos rojos y plaquetas. Su riesgo de infeccin y sangrado podra ser mayor. signos de infeccin: fiebre o escalofros, tos, dolor de garganta, dolor o dificultad para orinar signos de disminucin en la cantidad de plaquetas o sangrado: moretones, puntos rojos en la piel, heces de color negro y aspecto alquitranado, sangre en la orina signos de disminucin en la cantidad de glbulos rojos: debilidad o cansancio inusuales, Youth worker, aturdimiento problemas para respirar cambios en la visin Market researcher en la boca nuseas y vmito dolor, hinchazn, enrojecimiento en TEFL teacher de Air cabin crew, hormigueo o entumecimiento de las manos o los pies enrojecimiento, hinchazn o Pension scheme manager en las manos o los pies dolor estomacal sangrado inusual Efectos secundarios que generalmente no requieren atencin mdica (infrmelos a su mdico o a su profesional de la salud si persisten o si son molestos): cambios en las uas de los pies o de las manos diarrea comezn/picazn o sequedad de la piel cada del cabello dolor de cabeza prdida del apetito sensibilidad de los ojos a la luz Tree surgeon estomacal ojos inusualmente llorosos Puede ser que esta lista no menciona todos los posibles efectos secundarios. Comunquese a su mdico por asesoramiento mdico [employer]. Usted puede informar los efectos secundarios a la FDA por telfono al  1-800-FDA-1088. Dnde debo guardar mi medicina? Este medicamento se administra en hospitales o clnicas, y no necesitar guardarlo en su domicilio. ATENCIN: Este folleto es un resumen. Puede ser que no cubra toda la posible informacin. Si usted tiene preguntas acerca de esta medicina, consulte con su mdico, su farmacutico o su profesional de Technical sales engineer.  2023 Elsevier/Gold Standard (2019-08-14 00:00:00)   2023 Elsevier/Gold Standard (2014-06-02 00:00:00)

## 2022-04-11 NOTE — Progress Notes (Signed)
[place] OFFICE PROGRESS NOTE   Diagnosis: Colon cancer  INTERVAL HISTORY:   [name] returns as scheduled.  He is accompanied by his son and a Spanish interpreter.  He completed cycle 1 FOLFIRI/Avastin 03/29/2022.  He denies nausea/vomiting.  No mouth sores.  No diarrhea.  No hand or foot pain or redness.  He denies bleeding.  He had 2 episodes of mild dizziness over the past 2 weeks.  No unusual headaches.  No visual disturbance.  He has a good appetite.  He denies significant pain.  Objective:  Vital signs in last 24 hours:  Blood pressure (!) 142/68, pulse 77, temperature 98.1 F (36.7 C), temperature source Oral, resp. rate 18, height 5' 8" (1.727 m), weight 172 lb 12.8 oz (78.4 kg), SpO2 100 %.    HEENT: No thrush or ulcers. Resp: Lungs clear bilaterally. Cardio: Regular rate and rhythm. GI: Abdomen soft and nontender.  No hepatomegaly.  No mass. Vascular: No leg edema. Skin: Palms without erythema. Port-A-Cath without erythema.   Lab Results:  Lab Results  Component Value Date   WBC 4.0 04/11/2022   HGB 13.3 04/11/2022   HCT 40.6 04/11/2022   MCV 85.7 04/11/2022   PLT 175 04/11/2022   NEUTROABS 2.0 04/11/2022    Imaging:  No results found.  Medications: I have reviewed the patient's current medications.  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), transverse colon cancer stage IIB (T4aN0) status post extended right hemicolectomy 05/07/2019, MSS Tumor invasive superficial portion of the muscle ("internal third "), no tumor perforation, no vascular invasion or perineural invasion, negative resection margins, 0/4 lymph nodes Elevated CEA 02/10/2019 CTs 03/05/2019, compared to outside CTs from 11/22/2018-worsened wall thickening in the cecum stable apple core lesion in the mid transverse colon, no evidence of recurrent tumor at  the distal colonic anastomosis, no evidence of metastatic disease Colonoscopy 03/24/2019 20-2 malignant appearing masses noted in the colon, 1 filled the cecum measuring approximately 4 cm across and friable.  The other malignant appearing mass was circumferential creating a stricture with a 1.2 cm lumen located in the transverse segment approximately 4 to 5 cm in length.  There were 12-18 neoplastic appearing polyps in between the 2 obvious malignant masses ranging in size from 3 mm to 2 cm.  5 polyps distal to the transverse colon mass.  2 sigmoid colon polyps.  Cecum mass positive for adenocarcinoma.  Transverse colon mass positive for adenocarcinoma. Foundation 1 transverse colon-MSS, tumor mutation burden 7, KRAS wild-type, BRAF fusion 05/07/2019 laparoscopic extended right hemicolectomy, lysis of adhesions-5 cm invasive moderately differentiated adenocarcinoma involving cecum and proximal ascending colon, carcinoma invades into the pericolonic soft tissue; 7 cm invasive moderately differentiated carcinoma involving the transverse colon, carcinoma invades into the serosal surface (visceral peritoneum); all resection margins negative for carcinoma.  Lymphovascular invasion present.  In the proximal colon metastatic carcinoma involves 3 of 17 lymph nodes with 1 tumor deposit.  In the transverse colon 12 lymph nodes negative for metastatic carcinoma.  3 separate polyps: Tubular adenoma, inflammatory polyp and hyperplastic polyp Cycle 1 capecitabine 06/02/2019 Cycle 2 capecitabine 06/23/2019 Cycle 3 capecitabine 07/14/2019 Cycle 4 capecitabine 08/04/2019 Cycle 5 capecitabine 08/25/2019 Cycle 6 capecitabine 09/15/2019 Cycle 7 capecitabine 10/06/2019 Cycle 8 capecitabine 10/27/2019 Upper endoscopy 07/23/2020-negative Colonoscopy 07/23/2020-normal-appearing anastomoses, polyp removed from the descending colon-tubular adenoma CTs 01/18/2021-right abdominal mesenteric mass, mass in the low anatomic pelvis consistent with  metastases Cycle 1 FOLFOX 02/21/2021 Cycle 2 FOLFOX 03/07/2021, dose reductions made due to mild neutropenia Cycle 3 FOLFOX 03/21/2021 Cycle 4 FOLFOX 04/04/2021 Cycle 5 FOLFOX 04/19/2021 Cycle 6 FOLFOX 05/02/2021 CTs 05/12/2021-decrease size of nodular right mesenteric mass, complete resolution of soft tissue enhancing nodule in the right inguinal canal, resolution of nodular soft tissue mass posterior to the bladder, enlargement of a left upper lobe nodule and new 4 mm left upper lobe nodule-indeterminate Cycle 7 FOLFOX 05/16/2021 Cycle 8 FOLFOX 05/30/2021, oxaliplatin and 5-FU bolus held due to neuropathy and thrombocytopenia Cycle 9 FOLFOX 06/13/2021, oxaliplatin dose reduced Cycle 10 FOLFOX 06/27/2021 CT 07/07/2021-decrease in right mesenteric mass, decreased size of lobulated left upper lobe nodule, resolution of previous "new "left upper lobe nodule, new area of wall thickening at the distal transverse colon Treatment changed to Xeloda 2 weeks on/1 week off beginning 07/18/2021 Cycle 2 Xeloda beginning 08/09/2021 Cycle 3 Xeloda beginning 08/30/2021 Cycle 4 Xeloda beginning 09/21/2021 Cycle 5 Xeloda beginning 10/12/2021 Cycle 6 Xeloda beginning 11/02/2021 CTs 11/14/2021-right lower quadrant spiculated small bowel mesenteric mass measured 2.8 x 3.7 cm, previously 1.0 x 2.3 cm; adjacent mesenteric haziness and nodularity measuring up to 8 mm superiorly.  New 4 mm nodular lesion posterior segment right upper lobe along the major fissure. Treatment break beginning 11/17/2021 CTs 03/21/2022-multiple new liver metastases, enlarging implant in the right ileocolic mesentery, enlarging peritoneal implants in the low pelvis Cycle 1 FOLFIRI/Avastin 03/29/2022 Cycle 2 FOLFIRI/Avastin 04/11/2022 2.   Microcytic anemia secondary to #1.    Resolved 3.   Family history of pancreas and colon cancer, negative genetic testing per the [facility] panel, ATM variant of unknown significance 4.   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 appears stable.  He has completed 1 cycle of FOLFIRI/Avastin.  He tolerated well.  Plan to proceed with cycle 2 today as scheduled.  CBC and chemistry panel reviewed.  Labs adequate to proceed as above.  He will return for lab, follow-up, cycle 3 FOLFIRI/Avastin in 2 weeks.  He will contact the office in the interim with any problems.    Ned Card ANP/GNP-BC   04/11/2022  8:51 AM

## 2022-04-11 NOTE — Progress Notes (Signed)
Marcel at chairside as interpreter until 10:00

## 2022-04-11 NOTE — Progress Notes (Signed)
John Vance from [place] at chairside as interpreter.

## 2022-04-11 NOTE — Progress Notes (Signed)
Visit assisted today by interpreter w/[facility]

## 2022-04-11 NOTE — Addendum Note (Signed)
Addended by: Betsy Coder B on: 04/11/2022 09:25 AM   Modules accepted: Orders

## 2022-04-12 ENCOUNTER — Other Ambulatory Visit: Payer: Self-pay

## 2022-04-13 ENCOUNTER — Inpatient Hospital Stay: Payer: Commercial Managed Care - HMO

## 2022-04-13 VITALS — BP 164/66 | HR 60 | Temp 97.9°F | Resp 20

## 2022-04-13 DIAGNOSIS — C189 Malignant neoplasm of colon, unspecified: Secondary | ICD-10-CM

## 2022-04-13 DIAGNOSIS — Z5111 Encounter for antineoplastic chemotherapy: Secondary | ICD-10-CM | POA: Diagnosis not present

## 2022-04-13 MED ORDER — SODIUM CHLORIDE 0.9% FLUSH
10.0000 mL | INTRAVENOUS | Status: DC | PRN
  Administered 2022-04-13: 10 mL

## 2022-04-13 MED ORDER — HEPARIN SOD (PORK) LOCK FLUSH 100 UNIT/ML IV SOLN
500.0000 [IU] | Freq: Once | INTRAVENOUS | Status: AC | PRN
  Administered 2022-04-13: 500 [IU]

## 2022-04-13 NOTE — Progress Notes (Signed)
Interpreter Caro Laroche was present and assisted with patient's Pump Stop Appt.

## 2022-04-13 NOTE — Patient Instructions (Signed)

## 2022-04-24 ENCOUNTER — Other Ambulatory Visit: Payer: Self-pay | Admitting: Oncology

## 2022-04-24 DIAGNOSIS — C189 Malignant neoplasm of colon, unspecified: Secondary | ICD-10-CM

## 2022-04-26 ENCOUNTER — Inpatient Hospital Stay: Payer: Commercial Managed Care - HMO

## 2022-04-26 ENCOUNTER — Inpatient Hospital Stay: Payer: Commercial Managed Care - HMO | Admitting: Oncology

## 2022-04-26 ENCOUNTER — Inpatient Hospital Stay: Payer: Commercial Managed Care - HMO | Attending: Oncology

## 2022-04-26 ENCOUNTER — Encounter: Payer: Self-pay | Admitting: *Deleted

## 2022-04-26 VITALS — BP 143/62 | HR 66

## 2022-04-26 VITALS — BP 152/66 | HR 80 | Temp 98.1°F | Resp 18 | Ht 68.0 in | Wt 172.4 lb

## 2022-04-26 DIAGNOSIS — D509 Iron deficiency anemia, unspecified: Secondary | ICD-10-CM | POA: Diagnosis not present

## 2022-04-26 DIAGNOSIS — C189 Malignant neoplasm of colon, unspecified: Secondary | ICD-10-CM

## 2022-04-26 DIAGNOSIS — C787 Secondary malignant neoplasm of liver and intrahepatic bile duct: Secondary | ICD-10-CM | POA: Insufficient documentation

## 2022-04-26 DIAGNOSIS — L299 Pruritus, unspecified: Secondary | ICD-10-CM | POA: Diagnosis not present

## 2022-04-26 DIAGNOSIS — C187 Malignant neoplasm of sigmoid colon: Secondary | ICD-10-CM | POA: Insufficient documentation

## 2022-04-26 DIAGNOSIS — G620 Drug-induced polyneuropathy: Secondary | ICD-10-CM | POA: Diagnosis not present

## 2022-04-26 DIAGNOSIS — Z5111 Encounter for antineoplastic chemotherapy: Secondary | ICD-10-CM | POA: Insufficient documentation

## 2022-04-26 DIAGNOSIS — C180 Malignant neoplasm of cecum: Secondary | ICD-10-CM | POA: Diagnosis not present

## 2022-04-26 DIAGNOSIS — C182 Malignant neoplasm of ascending colon: Secondary | ICD-10-CM | POA: Insufficient documentation

## 2022-04-26 LAB — CMP (CANCER CENTER ONLY)
ALT: 11 U/L (ref 10–47)
AST: 16 U/L (ref 11–38)
Albumin: 3.9 g/dL (ref 3.5–5.0)
Alkaline Phosphatase: 70 U/L (ref 38–126)
Anion gap: 11 (ref 5–15)
BUN: 16 mg/dL (ref 8–23)
CO2: 25 mmol/L (ref 22–32)
Calcium: 9.3 mg/dL (ref 8.9–10.3)
Chloride: 104 mmol/L (ref 98–111)
Creatinine: 1 mg/dL (ref 0.60–1.20)
GFR, Estimated: >60 mL/min (ref >60)
Glucose, Bld: 131 mg/dL — ABNORMAL HIGH (ref 70–99)
Potassium: 4.2 mmol/L (ref 3.5–5.1)
Sodium: 140 mmol/L (ref 135–145)
Total Bilirubin: 0.3 mg/dL (ref 0.2–1.6)
Total Protein: 7.1 g/dL (ref 6.5–8.1)

## 2022-04-26 LAB — CBC WITH DIFFERENTIAL (CANCER CENTER ONLY)
Abs Immature Granulocytes: 0.01 10*3/uL (ref 0.00–0.07)
Basophils Absolute: 0 10*3/uL (ref 0.0–0.1)
Basophils Relative: 1 %
Eosinophils Absolute: 0.1 10*3/uL (ref 0.0–0.5)
Eosinophils Relative: 2 %
HCT: 37.9 % — ABNORMAL LOW (ref 39.0–52.0)
Hemoglobin: 12.5 g/dL — ABNORMAL LOW (ref 13.0–17.0)
Immature Granulocytes: 0 %
Lymphocytes Relative: 27 %
Lymphs Abs: 1 10*3/uL (ref 0.7–4.0)
MCH: 28.2 pg (ref 26.0–34.0)
MCHC: 33 g/dL (ref 30.0–36.0)
MCV: 85.4 fL (ref 80.0–100.0)
Monocytes Absolute: 0.7 10*3/uL (ref 0.1–1.0)
Monocytes Relative: 19 %
Neutro Abs: 1.9 10*3/uL (ref 1.7–7.7)
Neutrophils Relative %: 51 %
Platelet Count: 232 10*3/uL (ref 150–400)
RBC: 4.44 MIL/uL (ref 4.22–5.81)
RDW: 13.9 % (ref 11.5–15.5)
WBC Count: 3.6 10*3/uL — ABNORMAL LOW (ref 4.0–10.5)
nRBC: 0 % (ref 0.0–0.2)

## 2022-04-26 LAB — CEA (ACCESS): CEA (CHCC): 9.36 ng/mL — ABNORMAL HIGH (ref 0.00–5.00)

## 2022-04-26 LAB — TOTAL PROTEIN, URINE DIPSTICK: Protein, ur: NEGATIVE mg/dL

## 2022-04-26 MED ORDER — FLUOROURACIL CHEMO INJECTION 2.5 GM/50ML
300.0000 mg/m2 | Freq: Once | INTRAVENOUS | Status: AC
  Administered 2022-04-26: 600 mg via INTRAVENOUS
  Filled 2022-04-26: qty 12

## 2022-04-26 MED ORDER — SODIUM CHLORIDE 0.9 % IV SOLN
2000.0000 mg/m2 | INTRAVENOUS | Status: DC
  Administered 2022-04-26: 3850 mg via INTRAVENOUS
  Filled 2022-04-26: qty 77

## 2022-04-26 MED ORDER — SODIUM CHLORIDE 0.9 % IV SOLN
180.0000 mg/m2 | Freq: Once | INTRAVENOUS | Status: AC
  Administered 2022-04-26: 340 mg via INTRAVENOUS
  Filled 2022-04-26: qty 2

## 2022-04-26 MED ORDER — PALONOSETRON HCL INJECTION 0.25 MG/5ML
0.2500 mg | Freq: Once | INTRAVENOUS | Status: AC
  Administered 2022-04-26: 0.25 mg via INTRAVENOUS
  Filled 2022-04-26: qty 5

## 2022-04-26 MED ORDER — DOXYCYCLINE HYCLATE 100 MG PO TABS: 100.0000 mg | ORAL_TABLET | Freq: Two times a day (BID) | ORAL | 0 refills | Status: DC

## 2022-04-26 MED ORDER — SODIUM CHLORIDE 0.9 % IV SOLN
300.0000 mg/m2 | Freq: Once | INTRAVENOUS | Status: AC
  Administered 2022-04-26: 580 mg via INTRAVENOUS
  Filled 2022-04-26: qty 29

## 2022-04-26 MED ORDER — SODIUM CHLORIDE 0.9 % IV SOLN
10.0000 mg | Freq: Once | INTRAVENOUS | Status: AC
  Administered 2022-04-26: 10 mg via INTRAVENOUS
  Filled 2022-04-26: qty 10

## 2022-04-26 MED ORDER — SODIUM CHLORIDE 0.9 % IV SOLN: Freq: Once | INTRAVENOUS | Status: AC

## 2022-04-26 MED ORDER — ATROPINE SULFATE 1 MG/ML IV SOLN
0.5000 mg | Freq: Once | INTRAVENOUS | Status: AC | PRN
  Administered 2022-04-26: 0.5 mg via INTRAVENOUS
  Filled 2022-04-26: qty 1

## 2022-04-26 MED ORDER — SODIUM CHLORIDE 0.9 % IV SOLN
5.0000 mg/kg | Freq: Once | INTRAVENOUS | Status: AC
  Administered 2022-04-26: 400 mg via INTRAVENOUS
  Filled 2022-04-26: qty 16

## 2022-04-26 NOTE — Progress Notes (Signed)
[place] OFFICE PROGRESS NOTE   Diagnosis: Colon cancer  INTERVAL HISTORY:   John Vance returns as scheduled.  He completed another cycle of FOLFIRI-bevacizumab on 04/11/2022.  No nausea/vomiting, mouth sores, or diarrhea.  He reports constipation relieved with eating fruit.  He has mild cold sensitivity in the hands. He has developed a "rash "at the posterior scalp.  He had recent nasal congestion and rhinorrhea.  No bleeding or symptom of thrombosis.  Objective:  Vital signs in last 24 hours:  Blood pressure (!) 152/66, pulse 80, temperature 98.1 F (36.7 C), temperature source Oral, resp. rate 18, height _0  (1.727 m), weight 172 lb 6.4 oz (78.2 kg), SpO2 99 %.    HEENT: No thrush or ulcers Resp: Lungs clear bilaterally Cardio: Regular rate and rhythm GI: No hepatosplenomegaly, nontender Vascular: No leg edema  Skin: Pustular bilateral rash at the occipital/parietal scalp  Portacath/PICC-without erythema  Lab Results:  Lab Results  Component Value Date   WBC 3.6 (L) 04/26/2022   HGB 12.5 (L) 04/26/2022   HCT 37.9 (L) 04/26/2022   MCV 85.4 04/26/2022   PLT 232 04/26/2022   NEUTROABS 1.9 04/26/2022    CMP  Lab Results  Component Value Date   NA 138 04/11/2022   K 4.1 04/11/2022   CL 105 04/11/2022   CO2 26 04/11/2022   GLUCOSE 116 (H) 04/11/2022   BUN 16 04/11/2022   CREATININE 1.03 04/11/2022   CALCIUM 9.4 04/11/2022   PROT 7.3 04/11/2022   ALBUMIN 4.2 04/11/2022   AST 20 04/11/2022   ALT 14 04/11/2022   ALKPHOS 87 04/11/2022   BILITOT 0.5 04/11/2022   GFRNONAA >60 04/11/2022   GFRAA >60 12/01/2019    Lab Results  Component Value Date   CEA1 12.10 (H) 01/03/2021   CEA 30.56 (H) 03/21/2022      Medications: I have reviewed the patient's current medications.   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),  transverse colon cancer stage IIB (T4aN0) status post extended right hemicolectomy 05/07/2019, MSS Tumor invasive superficial portion of the muscle ("internal third "), no tumor perforation, no vascular invasion or perineural invasion, negative resection margins, 0/4 lymph nodes Elevated CEA 02/10/2019 CTs 03/05/2019, compared to outside CTs from 11/22/2018-worsened wall thickening in the cecum stable apple core lesion in the mid transverse colon, no evidence of recurrent tumor at the distal colonic anastomosis, no evidence of metastatic disease Colonoscopy 03/24/2019 20-2 malignant appearing masses noted in the colon, 1 filled the cecum measuring approximately 4 cm across and friable.  The other malignant appearing mass was circumferential creating a stricture with a 1.2 cm lumen located in the transverse segment approximately 4 to 5 cm in length.  There were 12-18 neoplastic appearing polyps in between the 2 obvious malignant masses ranging in size from 3 mm to 2 cm.  5 polyps distal to the transverse colon mass.  2 sigmoid colon polyps.  Cecum mass positive for adenocarcinoma.  Transverse colon mass positive for adenocarcinoma. Foundation 1 transverse colon-MSS, tumor mutation burden 7, KRAS wild-type, BRAF fusion 05/07/2019 laparoscopic extended right hemicolectomy, lysis of adhesions-5 cm invasive moderately differentiated adenocarcinoma involving cecum and proximal ascending colon, carcinoma invades into the pericolonic soft tissue; 7 cm invasive moderately differentiated carcinoma involving the transverse colon, carcinoma invades into the serosal surface (visceral peritoneum); all resection margins negative for carcinoma.  Lymphovascular invasion present.  In the proximal colon  metastatic carcinoma involves 3 of 17 lymph nodes with 1 tumor deposit.  In the transverse colon 12 lymph nodes negative for metastatic carcinoma.  3 separate polyps: Tubular adenoma, inflammatory polyp and hyperplastic polyp Cycle  1 capecitabine 06/02/2019 Cycle 2 capecitabine 06/23/2019 Cycle 3 capecitabine 07/14/2019 Cycle 4 capecitabine 08/04/2019 Cycle 5 capecitabine 08/25/2019 Cycle 6 capecitabine 09/15/2019 Cycle 7 capecitabine 10/06/2019 Cycle 8 capecitabine 10/27/2019 Upper endoscopy 07/23/2020-negative Colonoscopy 07/23/2020-normal-appearing anastomoses, polyp removed from the descending colon-tubular adenoma CTs 01/18/2021-right abdominal mesenteric mass, mass in the low anatomic pelvis consistent with metastases Cycle 1 FOLFOX 02/21/2021 Cycle 2 FOLFOX 03/07/2021, dose reductions made due to mild neutropenia Cycle 3 FOLFOX 03/21/2021 Cycle 4 FOLFOX 04/04/2021 Cycle 5 FOLFOX 04/19/2021 Cycle 6 FOLFOX 05/02/2021 CTs 05/12/2021-decrease size of nodular right mesenteric mass, complete resolution of soft tissue enhancing nodule in the right inguinal canal, resolution of nodular soft tissue mass posterior to the bladder, enlargement of a left upper lobe nodule and new 4 mm left upper lobe nodule-indeterminate Cycle 7 FOLFOX 05/16/2021 Cycle 8 FOLFOX 05/30/2021, oxaliplatin and 5-FU bolus held due to neuropathy and thrombocytopenia Cycle 9 FOLFOX 06/13/2021, oxaliplatin dose reduced Cycle 10 FOLFOX 06/27/2021 CT 07/07/2021-decrease in right mesenteric mass, decreased size of lobulated left upper lobe nodule, resolution of previous "new "left upper lobe nodule, new area of wall thickening at the distal transverse colon Treatment changed to Xeloda 2 weeks on/1 week off beginning 07/18/2021 Cycle 2 Xeloda beginning 08/09/2021 Cycle 3 Xeloda beginning 08/30/2021 Cycle 4 Xeloda beginning 09/21/2021 Cycle 5 Xeloda beginning 10/12/2021 Cycle 6 Xeloda beginning 11/02/2021 CTs 11/14/2021-right lower quadrant spiculated small bowel mesenteric mass measured 2.8 x 3.7 cm, previously 1.0 x 2.3 cm; adjacent mesenteric haziness and nodularity measuring up to 8 mm superiorly.  New 4 mm nodular lesion posterior segment right upper lobe along the major  fissure. Treatment break beginning 11/17/2021 CTs 03/21/2022-multiple new liver metastases, enlarging implant in the right ileocolic mesentery, enlarging peritoneal implants in the low pelvis Cycle 1 FOLFIRI/Avastin 03/29/2022 Cycle 2 FOLFIRI/Avastin 04/11/2022 Cycle 3 FOLFIRI/Avastin 04/26/2021 2.   Microcytic anemia secondary to #1.    Resolved 3.   Family history of pancreas and colon cancer, negative genetic testing per the [facility] panel, ATM variant of unknown significance 4.   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  He will complete cycle 3 today.  We will follow-up on the CEA from today.  He will return for an office visit and cycle 4 FOLFIRI/bevacizumab in 2 weeks.  He has developed an erythematous pustular rash at the posterior scalp.  He will complete a course of doxycycline for treatment of apparent folliculitis.  Betsy Coder, MD  04/26/2022  8:48 AM

## 2022-04-26 NOTE — Progress Notes (Signed)
Spanish Interpreter Cresenciano Genre was present during patient's flush and infusion appointment.

## 2022-04-26 NOTE — Progress Notes (Signed)
Patient seen by Dr. Benay Spice today  Vitals are within treatment parameters.No intervention needed for BP 152/66   Labs reviewed by Dr. Benay Spice and are within treatment parameters.  Per physician team, patient is ready for treatment and there are NO modifications to the treatment plan.

## 2022-04-26 NOTE — Progress Notes (Signed)
Visit today assisted by [facility] interpreter, [facility] as well as patient' son.

## 2022-04-26 NOTE — Patient Instructions (Addendum)
Instrucciones al darle de alta:  The chemotherapy medication bag should finish at 46 hours, 96 hours, or 7 days. For example, if your pump is scheduled for 46 hours and it was put on at 4:00 p.m., it should finish at 2:00 p.m. the day it is scheduled to come off regardless of your appointment time.     Estimated time to finish at 11:15am Friday 04/28/22.   If the display on your pump reads "Low Volume" and it is beeping, take the batteries out of the pump and come to the cancer center for it to be taken off.   If the pump alarms go off prior to the pump reading "Low Volume" then call [phone] and someone can assist you.  If the plunger comes out and the chemotherapy medication is leaking out, please use your home chemo spill kit to clean up the spill. Do NOT use paper towels or other household products.  If you have problems or questions regarding your pump, please call either [phone] (24 hours a day) or the cancer center Monday-Friday 8:00 a.m.- 4:30 p.m. at the clinic number and we will assist you. If you are unable to get assistance, then go to the nearest Emergency Department and ask the staff to contact the IV team for assistance.  Discharge Instructions Gracias por elegir al [facility] de Cncer de [place] para brindarle atencin mdica de oncologa y Music therapist.   Si usted tiene una cita de laboratorio con [place], por favor vaya directamente [place] y regstrese en el rea de Control and instrumentation engineer.   Use ropa cmoda y [place] para tener fcil acceso a las vas del Portacath (acceso venoso de Engineer, site duracin) o la lnea PICC (catter central colocado por va perifrica).   Nos esforzamos por ofrecerle tiempo de calidad con su proveedor. Es posible que tenga que volver a programar su cita si llega tarde (15 minutos o ms).  El llegar tarde le afecta a usted y a otros pacientes cuyas citas son posteriores a Merchandiser, retail.  Adems, si usted falta a tres o ms citas sin avisar  a la oficina, puede ser retirado(a) de la clnica a discrecin del proveedor.      Para las solicitudes de renovacin de recetas, pida a su farmacia que se ponga en contacto con nuestra oficina y deje que transcurran 17 horas para que se complete el proceso de las renovaciones.    Hoy usted recibi los siguientes agentes de quimioterapia e/o inmunoterapia Bevacizumab, Irinotecan, Leucovorin, Fluorouracil      Para ayudar a prevenir las nuseas y los vmitos despus de su tratamiento, le recomendamos que tome su medicamento para las nuseas segn las indicaciones.  LOS SNTOMAS QUE DEBEN COMUNICARSE INMEDIATAMENTE SE INDICAN A CONTINUACIN: *FIEBRE SUPERIOR A 100.4 F (38 C) O MS *ESCALOFROS O SUDORACIN *NUSEAS Y VMITOS QUE NO SE CONTROLAN CON EL MEDICAMENTO PARA LAS NUSEAS *DIFICULTAD INUSUAL PARA RESPIRAR  *MORETONES O HEMORRAGIAS NO HABITUALES *PROBLEMAS URINARIOS (dolor o ardor al Garment/textile technologist o frecuencia para Garment/textile technologist) *PROBLEMAS INTESTINALES (diarrea inusual, estreimiento, dolor cerca del ano) SENSIBILIDAD EN LA BOCA Y EN LA GARGANTA CON O SIN LA PRESENCIA DE LCERAS (dolor de garganta, llagas en la boca o dolor de muelas/dientes) ERUPCIN, HINCHAZN O DOLORES INUSUALES FLUJO VAGINAL INUSUAL O PICAZN/RASQUIA    Los puntos marcados con un asterisco ( *) indican una posible emergencia y debe hacer un seguimiento tan pronto como le sea posible o vaya al Departamento de Emergencias si se le presenta  algn problema.  Por favor, muestre la Nikolai DE ADVERTENCIA DE Windy Canny DE ADVERTENCIA DE Benay Spice al registrarse en [address] de Emergencias y a la enfermera de triaje.  Si tiene preguntas despus de su visita o necesita cancelar o volver a programar su cita, por favor pngase en contacto con [place]  Dept: [phone]  y [place] instrucciones. Las horas de oficina son de 8:00 a.m. a 4:30 p.m. de lunes a viernes. Por favor, tenga  en cuenta que los mensajes de voz que se dejan despus de las 4:00 p.m. posiblemente no se devolvern hasta el siguiente da de [place].  Cerramos los fines de semana y [employer]. En todo momento tiene acceso a una enfermera para preguntas urgentes. Por favor, llame al nmero principal de la clnica Dept: [phone] y [place] instrucciones.   Para cualquier pregunta que no sea de carcter urgente, tambin puede ponerse en contacto con su proveedor [employer]. Ahora ofrecemos visitas electrnicas para cualquier persona mayor de 18 aos que solicite atencin mdica en lnea para los sntomas que no sean urgentes. Para ms detalles vaya a [email or web address].   Tambin puede bajar la aplicacin de MyChart! Vaya a la tienda de aplicaciones, busque "MyChart", abra la aplicacin, seleccione [place], e ingrese con su nombre de usuario y la contrasea de Pharmacist, community.  Las mscaras son opcionales en los centros de Hotel manager. Si desea que su equipo de cuidados mdicos use una [employer] atienden, por favor hgaselo saber al personal. Bethann Berkshire una persona de apoyo que tenga por lo menos 16 aos para que le acompae a sus citas.  23-04-21 00:00:00) Bevacizumab Injection Qu es este medicamento? El BEVACIZUMAB es un anticuerpo monoclonal. Se [place] para tratar muchos tipos de cncer. Este medicamento puede ser utilizado para otros usos; si tiene alguna pregunta consulte con su proveedor de atencin mdica o con su farmacutico. MARCAS COMUNES: Alymsys, Avastin, MVASI, Jacob Moores le debo informar a mi profesional de la salud antes de tomar este medicamento? Necesitan saber si usted presenta alguno de los [employer] o situaciones: diabetes enfermedad cardiaca presin sangunea alta antecedentes de tos con sangre quimioterapia previa con antraciclinas (p. ej.: doxorubicina, daunorubicina, epirubicina) terapia de radiacin en curso o reciente ciruga  reciente o planes para realizarse una ciruga accidente cerebrovascular una reaccin alrgica o inusual al bevacizumab, a las protenas de Production designer, theatre/television/film, a las protenas de ratn, a otros medicamentos, alimentos, Scientist, water quality o conservantes si est embarazada o buscando quedar embarazada si est amamantando a un beb Cmo debo [employer]? Este medicamento se administra mediante infusin en una vena. Lo administra un profesional de Technical sales engineer en un hospital o en un entorno clnico. Hable con su pediatra para informarse acerca del uso de este medicamento en nios. Puede requerir atencin especial. Sobredosis: Pngase en contacto inmediatamente con un centro toxicolgico o una sala de urgencia si usted cree que haya tomado demasiado medicamento. ATENCIN: [employer] es solo para usted. No comparta este medicamento con nadie. Qu sucede si me olvido de una dosis? Es importante no olvidar ninguna dosis. Informe a su mdico o a su profesional de la salud si no puede asistir a Photographer. Qu puede interactuar con este medicamento? No se anticipan interacciones. Puede ser que esta lista no menciona todas las posibles interacciones. Informe a su profesional de KB Home	[place] de [employer] Corporation productos a base de hierbas, medicamentos de [place] o suplementos nutritivos que est tomando.  Si usted fuma, consume bebidas alcohlicas o si utiliza drogas ilegales, indqueselo tambin a su profesional de KB Home	[place]. Algunas sustancias pueden interactuar con su medicamento. A qu debo estar atento al usar Coca-Cola? Se supervisar su estado de salud atentamente mientras reciba este medicamento. Tendr que hacerse anlisis de sangre importantes y [employer] de [place] mientras est tomando este medicamento. Este medicamento podra aumentar el riesgo de moretones o sangrado. Consulte a su mdico o a su profesional de la salud si observa sangrados inusuales. Antes de realizarse una ciruga, hable con su  proveedor de atencin mdica para asegurarse de que no hay ningn problema. Este frmaco puede aumentar el riesgo de que el sitio o la herida quirrgica no sanen correctamente. Deber dejar de usar este frmaco durante [address] antes de la [place]. Despus de la ciruga, espere al menos [address] antes de reiniciar el uso de este frmaco. Asegrese de que el sitio o la herida quirrgica haya sanado lo suficiente antes de reiniciar el uso del frmaco. Hable con su proveedor de atencin mdica si tiene alguna pregunta. No debe quedar embarazada mientras est usando este medicamento o por 6 meses despus de dejar de usarlo. Las mujeres deben informar a su mdico si estn buscando quedar embarazadas o si creen que podran estar embarazadas. Existe la posibilidad de efectos secundarios graves en un beb sin nacer. Para obtener ms informacin, hable con su profesional de la salud o su farmacutico. No debe Economist a un beb mientras est usando este medicamento y durante 6 meses despus de la ltima dosis. Este medicamento ha causado insuficiencia ovrica en [employer]. Este medicamento puede interferir con la capacidad de tener hijos. Usted debe hablar con su mdico o su profesional de la salud si est preocupado por su fertilidad. Qu efectos secundarios puedo tener al [employer] este medicamento? Efectos secundarios que debe informar a su mdico o a Barrister's clerk de la salud tan pronto como sea posible: Chief of Staff, tales como erupcin cutnea, comezn/picazn o urticaria, e hinchazn de la cara, los labios o Advertising account planner u opresin en el pecho escalofros tos con sangre fiebre alta convulsiones estreimiento grave signos y sntomas de sangrado, tales como heces con sangre o de color negro y aspecto alquitranado; orina de color rojo o marrn oscuro; escupir sangre o material marrn que tiene el aspecto de posos (residuos) de caf; Tree surgeon rojas en la piel; sangrado o moretones inusuales en  los ojos, las encas o la nariz signos y sntomas de un cogulo sanguneo, tales como problemas respiratorios; Social research officer, government en el pecho; dolor de cabeza grave, repentino; dolor, hinchazn, calor en la pierna signos y sntomas de un accidente cerebrovascular, tales como cambios en la visin; confusin; dificultad para hablar o entender; dolores de cabeza intensos; entumecimiento o debilidad repentina de la cara, el brazo o la pierna; problemas al Writer; Chief of Staff; prdida de equilibrio o coordinacin dolor estomacal sudoracin hinchazn de piernas o tobillos vmito aumento de peso Efectos secundarios que generalmente no requieren atencin mdica (infrmelos a su mdico o a Barrister's clerk de la salud si persisten o si son molestos): dolor de espalda cambios en el sentido del gusto disminucin del apetito piel seca nuseas cansancio Puede ser que esta lista no menciona todos los posibles efectos secundarios. Comunquese a su mdico por asesoramiento mdico Humana Inc. Usted puede informar los efectos secundarios a la FDA por telfono al 1-800-FDA-1088. Dnde debo guardar mi medicina? Este medicamento se administra en hospitales o clnicas, y no necesitar  guardarlo en su domicilio. ATENCIN: Este folleto es un resumen. Puede ser que no cubra toda la posible informacin. Si usted tiene preguntas acerca de esta medicina, consulte con su mdico, su farmacutico o su profesional de Technical sales engineer.  2023 Elsevier/Gold Standard (2020-11-10 00:00:00)  Irinotecan Injection Qu es este medicamento? El IRINOTECN es un agente quimioteraputico. Se utiliza en el tratamiento del cncer de colon y rectal. Este medicamento puede ser utilizado para otros usos; si tiene alguna pregunta consulte con su proveedor de atencin mdica o con su farmacutico. MARCAS COMUNES: Camptosar Qu le debo informar a mi profesional de la salud antes de tomar este medicamento? Necesitan saber si usted presenta alguno  de los siguientes problemas o situaciones: deshidratacin diarrea infeccin (especialmente infecciones virales, como varicela, fuegos labiales o herpes) enfermedad heptica recuentos sanguneos bajos, como baja cantidad de glbulos blancos, plaquetas o glbulos rojos niveles bajos de calcio, magnesio o potasio en la sangre terapia de radiacin en curso o reciente una reaccin alrgica o inusual al irinotecn, a otros medicamentos, alimentos, colorantes o conservantes si est embarazada o buscando quedar embarazada si est amamantando a un beb Cmo debo utilizar este medicamento? Este medicamento se administra como infusin en una vena. Un profesional de la salud especialmente capacitado lo administra en un hospital o clnica. Hable con su pediatra para informarse acerca del uso de este medicamento en nios. Puede requerir atencin especial. Sobredosis: Pngase en contacto inmediatamente con un centro toxicolgico o una sala de urgencia si usted cree que haya tomado demasiado medicamento. ATENCIN: [employer] es solo para usted. No comparta este medicamento con nadie. Qu sucede si me olvido de una dosis? Es importante no olvidar ninguna dosis. Informe a su mdico o a su profesional de la salud si no puede asistir a Photographer. Qu puede interactuar con este medicamento? No use este medicamento con ninguno de los siguientes frmacos: cobicistat itraconazol Este medicamento podra interactuar con los siguientes frmacos: medicamentos antivirales para el VIH o SIDA ciertos antibiticos, tales como rifampicina o rifabutina ciertos medicamentos para infecciones micticas, tales como ketoconazol, posaconazol y voriconazol ciertos medicamentos para convulsiones, tales como [place], fenobarbital y fenitona claritromicina gemfibrozil nefazodona hierba de [place] Puede ser que esta lista no menciona todas las posibles interacciones. Informe a su profesional de KB Home	[place] de [employer] productos a base de hierbas, medicamentos de [place] o suplementos nutritivos que est tomando. Si usted fuma, consume bebidas alcohlicas o si utiliza drogas ilegales, indqueselo tambin a su profesional de KB Home	[place]. Algunas sustancias pueden interactuar con su medicamento. A qu debo estar atento al usar Coca-Cola? Se supervisar su estado de salud atentamente mientras reciba este medicamento. Tendr que hacerse anlisis de sangre importantes mientras est usando este medicamento. Este medicamento podra hacerle sentir un Nurse, mental health. Esto es normal, ya que la quimioterapia puede afectar tanto a las clulas sanas como a las clulas cancerosas. Si presenta algn efecto secundario, infrmelo. Contine con el tratamiento aun si se siente enfermo, a menos que su mdico le indique que lo suspenda. En algunos casos, podra recibir [employer] Brands para ayudarlo con los efectos secundarios. Siga todas las instrucciones para usarlos. Puede experimentar somnolencia o mareos. No conduzca, no utilice maquinaria ni haga nada que Associate Professor en estado de alerta hasta que sepa cmo le afecta este medicamento. No se siente ni se ponga de pie con rapidez, especialmente si es un paciente de edad avanzada. Esto reduce el riesgo de mareos o [employer]. Consulte a Barrister's clerk  de la salud si tiene fiebre, escalofros, dolor de garganta o cualquier otro sntoma de resfro o gripe. No se trate usted mismo. Este medicamento reduce la capacidad del cuerpo para combatir infecciones. Trate de no acercarse a personas que estn enfermas. Evite usar productos que contienen aspirina, acetaminofeno, ibuprofeno, naproxeno o ketoprofeno, a menos que as lo indique su mdico. Estos productos pueden ocultar la fiebre. Este medicamento podra aumentar el riesgo de moretones o sangrado. Consulte a su mdico o a su profesional de la salud si observa sangrados inusuales. Proceda con cuidado al cepillar sus  dientes, usar hilo dental o Risk manager palillos para los dientes, ya que podra contraer una infeccin o Therapist, art con mayor facilidad. Si se somete a algn tratamiento dental, informe a su dentista que est [employer]. No debe quedar embarazada mientras est usando este medicamento o por 6 meses despus de dejar de usarlo. Las mujeres deben informar a su profesional de la salud si estn buscando quedar embarazadas o si creen que podran estar embarazadas. Los hombres no deben Social worker a Social research officer, government estn recibiendo Coca-Cola y [place] 3 meses despus de dejar de usarlo. Existe la posibilidad de que ocurran efectos secundarios graves en un beb sin nacer. Para obtener ms informacin, hable con su profesional de KB Home	[place]. No debe amamantar a un beb mientras est tomando Coca-Cola o por 7 das despus de dejar de usarlo. Este medicamento ha causado insuficiencia ovrica en [employer]. Este medicamento puede causar dificultades para [place]. Hable con su profesional de la salud si le preocupa su fertilidad. Este medicamento ha causado recuentos de esperma reducidos en algunos hombres. Esto puede hacer ms difcil que un hombre embarace a Musician. Hable con su profesional de la salud si le preocupa su fertilidad. Qu efectos secundarios puedo tener al [employer] este medicamento? Efectos secundarios que debe informar a su mdico o a Barrister's clerk de la salud tan pronto como sea posible: Chief of Staff, tales como erupcin cutnea, comezn/picazn o urticaria, e hinchazn de la cara, los labios o la Holiday representative en el pecho diarrea enrojecimiento, goteo nasal, sudoracin durante la infusin recuentos sanguneos bajos: este medicamento podra reducir la cantidad de glbulos blancos, glbulos rojos y plaquetas. Su riesgo de infeccin y sangrado podra ser mayor. nuseas, vmito dolor, hinchazn, calor en la pierna signos de disminucin en la cantidad de  plaquetas o sangrado: moretones, puntos rojos en la piel, heces de color negro y aspecto alquitranado, sangre en la orina signos de infeccin: fiebre o escalofros, tos, dolor de Investment banker, operational, Social research officer, government o dificultad para orinar signos de disminucin en la cantidad de glbulos rojos: debilidad o cansancio inusuales, desmayos, aturdimiento Efectos secundarios que generalmente no requieren Geophysical data processor (infrmelos a su mdico o a Barrister's clerk de la salud si persisten o si son molestos): estreimiento cada del cabello dolor de cabeza prdida del apetito llagas en la boca dolor estomacal Puede ser que esta lista no menciona todos los posibles efectos secundarios. Comunquese a su mdico por asesoramiento mdico [employer]ana Inc. Usted puede informar los efectos secundarios a la FDA por telfono al 1-800-FDA-1088. Dnde debo guardar mi medicina? Este medicamento se administra en hospitales o clnicas, y no necesitar guardarlo en su domicilio. ATENCIN: Este folleto es un resumen. Puede ser que no cubra toda la posible informacin. Si usted tiene preguntas acerca de esta medicina, consulte con su mdico, su farmacutico o su profesional de Technical sales engineer.  2023 Elsevier/Gold Standard (2019-08-14 00:00:00)  Leucovorin Injection Sander Nephew  es este medicamento? La LEUCOVORINA se [place] para prevenir o tratar [employer] nocivos de ciertos medicamentos. Este medicamento tambin sirve para tratar la anemia provocada por una nivel bajo de cido flico en el cuerpo. Tambin se puede administrar con 5-fluorouracilo (5-FU), para tratar el cncer de colon. Este medicamento puede ser utilizado para otros usos; si tiene alguna pregunta consulte con su proveedor de atencin mdica o con su farmacutico. Qu le debo informar a mi profesional de la salud antes de tomar este medicamento? Necesita saber si usted presenta alguno de los siguientes problemas o situaciones: anemia debido a bajos niveles de vitamina  B-12 en la sangre una reaccin alrgica o inusual a la leucovorina, cido flico, a otros medicamentos, alimentos, colorantes o conservantes si est embarazada o buscando quedar embarazada si est amamantando a un beb Cmo debo utilizar este medicamento? Este medicamento se administra mediante inyeccin por va intramuscular o intravenosa. Lo administra un profesional de Technical sales engineer en un hospital o en un entorno clnico. Hable con su pediatra para informarse acerca del uso de este medicamento en nios. Puede requerir atencin especial. Sobredosis: Pngase en contacto inmediatamente con un centro toxicolgico o una sala de urgencia si usted cree que haya tomado demasiado medicamento. ATENCIN: [employer] es solo para usted. No comparta este medicamento con nadie. Qu sucede si me olvido de una dosis? No se aplica en este caso. Qu puede interactuar con este medicamento? capecitabina fluorouracilo fenobarbital fenitona primidona trimetoprima- sulfametoxasol Puede ser que esta lista no menciona todas las posibles interacciones. Informe a su profesional de KB Home	[place] de [employer] Corporation productos a base de hierbas, medicamentos de [place] o suplementos nutritivos que est tomando. Si usted fuma, consume bebidas alcohlicas o si utiliza drogas ilegales, indqueselo tambin a su profesional de KB Home	[place]. Algunas sustancias pueden interactuar con su medicamento. A qu debo estar atento al usar Coca-Cola? Se supervisar su estado de salud atentamente mientras reciba este medicamento. Este medicamento puede aumentar los efectos secundarios de 5-fluorouracilo, 5-FU. Si tiene diarrea o [employer] boca que no mejoran o que [place], consulte a su mdico o a su profesional de KB Home	[place]. Qu efectos secundarios puedo tener al [employer] Corporation este medicamento? Efectos secundarios que debe informar a su mdico o a Barrister's clerk de la salud tan pronto como sea posible: Chief of Staff como  erupcin cutnea, picazn o urticarias, hinchazn de la cara, labios o lengua problemas respiratorios fiebre, infeccin llagas en la boca sangrado, magulladuras inusuales cansancio o debilidad inusual Efectos secundarios que, por lo general, no requieren atencin mdica (debe informarlos a su mdico o a su profesional de la salud si persisten o si son molestos): estreimiento o diarrea prdida del apetito nuseas, vmito Puede ser que esta lista no menciona todos los posibles efectos secundarios. Comunquese a su mdico por asesoramiento mdico [employer]ana Inc. Usted puede informar los efectos secundarios a la FDA por telfono al 1-800-FDA-1088. Dnde debo guardar mi medicina? Este medicamento se administra en hospitales o clnicas y no necesitar guardarlo en su domicilio. ATENCIN: Este folleto es un resumen. Puede ser que no cubra toda la posible informacin. Si usted tiene preguntas acerca de esta medicina, consulte con su mdico, su farmacutico o su profesional de Technical sales engineer. Fluorouracil Injection Qu es este medicamento? El [place], 5-FU es un agente quimioteraputico. Desacelera el crecimiento de clulas cancerosas. Este medicamento se [place] para tratar muchos tipos de cncer, tales como cncer de mama, cncer de colon o rectal, cncer de pncreas y cncer  de estmago. Este medicamento puede ser utilizado para otros usos; si tiene alguna pregunta consulte con su proveedor de atencin mdica o con su farmacutico. MARCAS COMUNES: Adrucil Qu le debo informar a mi profesional de la salud antes de tomar este medicamento? Necesitan saber si usted presenta alguno de los siguientes problemas o situaciones: trastornos sanguneos deficiencia de dihidropirimidina deshidrogenasa (DPD) infeccin (especialmente infecciones virales, como varicela, fuegos labiales o herpes) enfermedad renal enfermedad heptica desnutricin, nutricin deficiente terapia de radiacin en curso o  reciente una reaccin alrgica o inusual al fluorouracilo, a otros medicamentos quimioteraputicos, a otros medicamentos, alimentos, colorantes o conservantes si est embarazada o buscando quedar embarazada si est amamantando a un beb Cmo debo utilizar este medicamento? Este medicamento se administra como infusin o inyeccin en una vena. Un profesional de la salud especialmente capacitado lo administra en un hospital o clnica. Hable con su pediatra para informarse acerca del uso de este medicamento en nios. Puede requerir atencin especial. Sobredosis: Pngase en contacto inmediatamente con un centro toxicolgico o una sala de urgencia si usted cree que haya tomado demasiado medicamento. ATENCIN: [employer] es solo para usted. No comparta este medicamento con nadie. Qu sucede si me olvido de una dosis? Es importante no olvidar ninguna dosis. Informe a su mdico o a su profesional de la salud si no puede asistir a Photographer. Qu puede interactuar con este medicamento? No use este medicamento con ninguno de los siguientes frmacos: vacunas de virus vivos Este medicamento tambin puede interactuar con los siguientes frmacos: medicamentos que tratan o previenen cogulos sanguneos, tales como warfarina, enoxaparina y dalteparina Puede ser que esta lista no menciona todas las posibles interacciones. Informe a su profesional de [employer]	[place] de [employer] productos a base de hierbas, medicamentos de [place] o suplementos nutritivos que est tomando. Si usted fuma, consume bebidas alcohlicas o si utiliza drogas ilegales, indqueselo tambin a su profesional de [employer]	[place]. Algunas sustancias pueden interactuar con su medicamento. A qu debo estar atento al usar [employer]? Visite a su mdico para que revise su evolucin. Este medicamento podra hacerle sentir un Nurse, mental health. Esto es normal, ya que la quimioterapia puede afectar tanto a las clulas sanas como a las clulas  cancerosas. Si presenta algn efecto secundario, infrmelo. Contine con el tratamiento aun si se siente enfermo, a menos que su mdico le indique que lo suspenda. En algunos casos, podra recibir [employer] para ayudarlo con los efectos secundarios. Siga todas las instrucciones para usarlos. Llame a su mdico o a su profesional de la salud si tiene fiebre, escalofros o dolor de garganta, o cualquier otro sntoma de resfro o gripe. No se trate usted mismo. Este medicamento reduce la capacidad del cuerpo para combatir infecciones. Trate de no acercarse a personas que estn enfermas. Este medicamento podra aumentar el riesgo de moretones o sangrado. Consulte a su mdico o a su profesional de la salud si observa sangrados inusuales. Proceda con cuidado al cepillar sus dientes, usar hilo dental o Risk manager palillos para los dientes, ya que podra contraer una infeccin o Therapist, art con mayor facilidad. Si se somete a algn tratamiento dental, informe a su dentista que est [employer]. Evite usar productos que contienen aspirina, acetaminofeno, ibuprofeno, naproxeno o ketoprofeno, a menos que as lo indique su mdico. Estos productos pueden ocultar la fiebre. No debe quedar embarazada mientras est [employer]. Las mujeres deben informar a su mdico si estn buscando quedar embarazadas o si creen que podran estar embarazadas. Existe  la posibilidad de efectos secundarios graves en un beb sin nacer. Para obtener ms informacin, hable con su profesional de la salud o su farmacutico. No debe Economist a un beb mientras est usando este medicamento. Los hombres deben informar a su mdico si quieren tener hijos. Este medicamento puede reducir el recuento de esperma. No trate la diarrea con productos de [employer]. Contacte a su mdico si tiene diarrea por ms de 2 das, o si es grave y [place]. Este medicamento puede aumentar su sensibilidad al sol. Evite la [employer]. Si no  la Product manager, utilice ropa protectora y crema de Photographer. No utilice lmparas solares, camas solares ni cabinas solares. Qu efectos secundarios puedo tener al [employer] este medicamento? Efectos secundarios que debe informar a su mdico o a Barrister's clerk de la salud tan pronto como sea posible: Chief of Staff, tales como erupcin cutnea, comezn/picazn o urticaria, e hinchazn de la cara, los labios o la lengua recuentos sanguneos bajos: este medicamento podra reducir la cantidad de glbulos blancos, glbulos rojos y plaquetas. Su riesgo de infeccin y sangrado podra ser mayor. signos de infeccin: fiebre o escalofros, tos, dolor de garganta, dolor o dificultad para orinar signos de disminucin en la cantidad de plaquetas o sangrado: moretones, puntos rojos en la piel, heces de color negro y aspecto alquitranado, sangre en la orina signos de disminucin en la cantidad de glbulos rojos: debilidad o cansancio inusuales, Youth worker, aturdimiento problemas para respirar cambios en la visin Market researcher en la boca nuseas y vmito dolor, hinchazn, enrojecimiento en TEFL teacher de Air cabin crew, hormigueo o entumecimiento de las manos o los pies enrojecimiento, hinchazn o Pension scheme manager en las manos o los pies dolor estomacal sangrado inusual Efectos secundarios que generalmente no requieren atencin mdica (infrmelos a su mdico o a su profesional de la salud si persisten o si son molestos): cambios en las uas de los pies o de las manos diarrea comezn/picazn o sequedad de la piel cada del cabello dolor de cabeza prdida del apetito sensibilidad de los ojos a la luz Tree surgeon estomacal ojos inusualmente llorosos Puede ser que esta lista no menciona todos los posibles efectos secundarios. Comunquese a su mdico por asesoramiento mdico [employer] Usted puede informar los efectos secundarios a la FDA por telfono al  1-800-FDA-1088. Dnde debo guardar mi medicina? Este medicamento se administra en hospitales o clnicas, y no necesitar guardarlo en su domicilio. ATENCIN: Este folleto es un resumen. Puede ser que no cubra toda la posible informacin. Si usted tiene preguntas acerca de esta medicina, consulte con su mdico, su farmacutico o su profesional de Technical sales engineer.  2023 Elsevier/Gold Standard (2019-08-14 00:00:00)   2023 Elsevier/Gold Standard (2014-06-02 00:00:00)

## 2022-04-26 NOTE — Patient Instructions (Signed)

## 2022-04-27 ENCOUNTER — Other Ambulatory Visit: Payer: Self-pay

## 2022-04-28 ENCOUNTER — Inpatient Hospital Stay: Payer: Commercial Managed Care - HMO

## 2022-04-28 VITALS — BP 165/61 | HR 61 | Temp 98.1°F | Resp 18

## 2022-04-28 DIAGNOSIS — C189 Malignant neoplasm of colon, unspecified: Secondary | ICD-10-CM

## 2022-04-28 DIAGNOSIS — Z5111 Encounter for antineoplastic chemotherapy: Secondary | ICD-10-CM | POA: Diagnosis not present

## 2022-04-28 MED ORDER — HEPARIN SOD (PORK) LOCK FLUSH 100 UNIT/ML IV SOLN
500.0000 [IU] | Freq: Once | INTRAVENOUS | Status: AC | PRN
  Administered 2022-04-28: 500 [IU]

## 2022-04-28 MED ORDER — SODIUM CHLORIDE 0.9% FLUSH
10.0000 mL | INTRAVENOUS | Status: DC | PRN
  Administered 2022-04-28: 10 mL

## 2022-04-28 NOTE — Patient Instructions (Signed)

## 2022-05-07 ENCOUNTER — Other Ambulatory Visit: Payer: Self-pay | Admitting: Oncology

## 2022-05-07 DIAGNOSIS — C189 Malignant neoplasm of colon, unspecified: Secondary | ICD-10-CM

## 2022-05-10 ENCOUNTER — Inpatient Hospital Stay: Payer: Commercial Managed Care - HMO

## 2022-05-10 ENCOUNTER — Encounter: Payer: Self-pay | Admitting: Nurse Practitioner

## 2022-05-10 ENCOUNTER — Inpatient Hospital Stay: Payer: Commercial Managed Care - HMO | Admitting: Nurse Practitioner

## 2022-05-10 VITALS — BP 158/71 | HR 75 | Temp 98.1°F | Resp 18 | Wt 172.0 lb

## 2022-05-10 VITALS — BP 142/63 | HR 65 | Resp 20

## 2022-05-10 DIAGNOSIS — C189 Malignant neoplasm of colon, unspecified: Secondary | ICD-10-CM | POA: Diagnosis not present

## 2022-05-10 DIAGNOSIS — Z5111 Encounter for antineoplastic chemotherapy: Secondary | ICD-10-CM | POA: Diagnosis not present

## 2022-05-10 LAB — CBC WITH DIFFERENTIAL (CANCER CENTER ONLY)
Abs Immature Granulocytes: 0.01 10*3/uL (ref 0.00–0.07)
Basophils Absolute: 0 10*3/uL (ref 0.0–0.1)
Basophils Relative: 1 %
Eosinophils Absolute: 0.1 10*3/uL (ref 0.0–0.5)
Eosinophils Relative: 3 %
HCT: 38.3 % — ABNORMAL LOW (ref 39.0–52.0)
Hemoglobin: 12.8 g/dL — ABNORMAL LOW (ref 13.0–17.0)
Immature Granulocytes: 0 %
Lymphocytes Relative: 29 %
Lymphs Abs: 1.2 10*3/uL (ref 0.7–4.0)
MCH: 28.8 pg (ref 26.0–34.0)
MCHC: 33.4 g/dL (ref 30.0–36.0)
MCV: 86.1 fL (ref 80.0–100.0)
Monocytes Absolute: 0.6 10*3/uL (ref 0.1–1.0)
Monocytes Relative: 14 %
Neutro Abs: 2.3 10*3/uL (ref 1.7–7.7)
Neutrophils Relative %: 53 %
Platelet Count: 153 10*3/uL (ref 150–400)
RBC: 4.45 MIL/uL (ref 4.22–5.81)
RDW: 15.2 % (ref 11.5–15.5)
WBC Count: 4.2 10*3/uL (ref 4.0–10.5)
nRBC: 0 % (ref 0.0–0.2)

## 2022-05-10 LAB — CMP (CANCER CENTER ONLY)
ALT: 11 U/L (ref 0–44)
AST: 17 U/L (ref 15–41)
Albumin: 4 g/dL (ref 3.5–5.0)
Alkaline Phosphatase: 73 U/L (ref 38–126)
Anion gap: 6 (ref 5–15)
BUN: 19 mg/dL (ref 8–23)
CO2: 26 mmol/L (ref 22–32)
Calcium: 9.4 mg/dL (ref 8.9–10.3)
Chloride: 107 mmol/L (ref 98–111)
Creatinine: 0.98 mg/dL (ref 0.61–1.24)
GFR, Estimated: >60 mL/min (ref >60)
Glucose, Bld: 113 mg/dL — ABNORMAL HIGH (ref 70–99)
Potassium: 4 mmol/L (ref 3.5–5.1)
Sodium: 139 mmol/L (ref 135–145)
Total Bilirubin: 0.4 mg/dL (ref 0.3–1.2)
Total Protein: 6.9 g/dL (ref 6.5–8.1)

## 2022-05-10 LAB — CEA (ACCESS): CEA (CHCC): 10.77 ng/mL — ABNORMAL HIGH (ref 0.00–5.00)

## 2022-05-10 MED ORDER — SODIUM CHLORIDE 0.9 % IV SOLN
180.0000 mg/m2 | Freq: Once | INTRAVENOUS | Status: AC
  Administered 2022-05-10: 340 mg via INTRAVENOUS
  Filled 2022-05-10: qty 2

## 2022-05-10 MED ORDER — SODIUM CHLORIDE 0.9 % IV SOLN: Freq: Once | INTRAVENOUS | Status: AC

## 2022-05-10 MED ORDER — FLUOROURACIL CHEMO INJECTION 2.5 GM/50ML
300.0000 mg/m2 | Freq: Once | INTRAVENOUS | Status: AC
  Administered 2022-05-10: 600 mg via INTRAVENOUS
  Filled 2022-05-10: qty 12

## 2022-05-10 MED ORDER — SODIUM CHLORIDE 0.9 % IV SOLN
2000.0000 mg/m2 | INTRAVENOUS | Status: DC
  Administered 2022-05-10: 3850 mg via INTRAVENOUS
  Filled 2022-05-10: qty 77

## 2022-05-10 MED ORDER — ATROPINE SULFATE 1 MG/ML IV SOLN
0.5000 mg | Freq: Once | INTRAVENOUS | Status: AC | PRN
  Administered 2022-05-10: 0.5 mg via INTRAVENOUS
  Filled 2022-05-10: qty 1

## 2022-05-10 MED ORDER — PALONOSETRON HCL INJECTION 0.25 MG/5ML
0.2500 mg | Freq: Once | INTRAVENOUS | Status: AC
  Administered 2022-05-10: 0.25 mg via INTRAVENOUS
  Filled 2022-05-10: qty 5

## 2022-05-10 MED ORDER — SODIUM CHLORIDE 0.9 % IV SOLN
10.0000 mg | Freq: Once | INTRAVENOUS | Status: AC
  Administered 2022-05-10: 10 mg via INTRAVENOUS
  Filled 2022-05-10: qty 10

## 2022-05-10 MED ORDER — SODIUM CHLORIDE 0.9 % IV SOLN
300.0000 mg/m2 | Freq: Once | INTRAVENOUS | Status: AC
  Administered 2022-05-10: 580 mg via INTRAVENOUS
  Filled 2022-05-10: qty 29

## 2022-05-10 MED ORDER — SODIUM CHLORIDE 0.9 % IV SOLN
5.0000 mg/kg | Freq: Once | INTRAVENOUS | Status: AC
  Administered 2022-05-10: 400 mg via INTRAVENOUS
  Filled 2022-05-10: qty 16

## 2022-05-10 NOTE — Progress Notes (Signed)
Patient seen by Lisa Thomas, NP today  Vitals are within treatment parameters.   Labs reviewed by Lisa Thomas, NP and are within treatment parameters.  Per physician team, patient is ready for treatment and there are NO modifications to the treatment plan. 

## 2022-05-10 NOTE — Progress Notes (Deleted)
[place] OFFICE PROGRESS NOTE   Diagnosis:   INTERVAL HISTORY:   ***  Objective:  Vital signs in last 24 hours:  There were no vitals taken for this visit.    HEENT: *** Lymphatics: *** Resp: *** Cardio: *** GI: *** Vascular: *** Neuro:***  Skin:***   Portacath/PICC-without erythema  Lab Results:  Lab Results  Component Value Date   WBC 3.6 (L) 04/26/2022   HGB 12.5 (L) 04/26/2022   HCT 37.9 (L) 04/26/2022   MCV 85.4 04/26/2022   PLT 232 04/26/2022   NEUTROABS 1.9 04/26/2022    CMP  Lab Results  Component Value Date   NA 140 04/26/2022   K 4.2 04/26/2022   CL 104 04/26/2022   CO2 25 04/26/2022   GLUCOSE 131 (H) 04/26/2022   BUN 16 04/26/2022   CREATININE 1.00 04/26/2022   CALCIUM 9.3 04/26/2022   PROT 7.1 04/26/2022   ALBUMIN 3.9 04/26/2022   AST 16 04/26/2022   ALT 11 04/26/2022   ALKPHOS 70 04/26/2022   BILITOT 0.3 04/26/2022   GFRNONAA >60 04/26/2022   GFRAA >60 12/01/2019    Lab Results  Component Value Date   CEA1 12.10 (H) 01/03/2021   CEA 9.36 (H) 04/26/2022    Medications: I have reviewed the patient's current medications.   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), transverse colon cancer stage IIB (T4aN0) status post extended right hemicolectomy 05/07/2019, MSS Tumor invasive superficial portion of the muscle ("internal third "), no tumor perforation, no vascular invasion or perineural invasion, negative resection margins, 0/4 lymph nodes Elevated CEA 02/10/2019 CTs 03/05/2019, compared to outside CTs from 11/22/2018-worsened wall thickening in the cecum stable apple core lesion in the mid transverse colon, no evidence of recurrent tumor at the distal colonic anastomosis, no evidence of metastatic disease Colonoscopy 03/24/2019 20-2 malignant appearing masses noted in the colon, 1 filled the cecum  measuring approximately 4 cm across and friable.  The other malignant appearing mass was circumferential creating a stricture with a 1.2 cm lumen located in the transverse segment approximately 4 to 5 cm in length.  There were 12-18 neoplastic appearing polyps in between the 2 obvious malignant masses ranging in size from 3 mm to 2 cm.  5 polyps distal to the transverse colon mass.  2 sigmoid colon polyps.  Cecum mass positive for adenocarcinoma.  Transverse colon mass positive for adenocarcinoma. Foundation 1 transverse colon-MSS, tumor mutation burden 7, KRAS wild-type, BRAF fusion 05/07/2019 laparoscopic extended right hemicolectomy, lysis of adhesions-5 cm invasive moderately differentiated adenocarcinoma involving cecum and proximal ascending colon, carcinoma invades into the pericolonic soft tissue; 7 cm invasive moderately differentiated carcinoma involving the transverse colon, carcinoma invades into the serosal surface (visceral peritoneum); all resection margins negative for carcinoma.  Lymphovascular invasion present.  In the proximal colon metastatic carcinoma involves 3 of 17 lymph nodes with 1 tumor deposit.  In the transverse colon 12 lymph nodes negative for metastatic carcinoma.  3 separate polyps: Tubular adenoma, inflammatory polyp and hyperplastic polyp Cycle 1 capecitabine 06/02/2019 Cycle 2 capecitabine 06/23/2019 Cycle 3 capecitabine 07/14/2019 Cycle 4 capecitabine 08/04/2019 Cycle 5 capecitabine 08/25/2019 Cycle 6 capecitabine 09/15/2019 Cycle 7 capecitabine 10/06/2019 Cycle 8 capecitabine 10/27/2019 Upper endoscopy 07/23/2020-negative Colonoscopy 07/23/2020-normal-appearing anastomoses, polyp removed from the descending colon-tubular adenoma CTs 01/18/2021-right abdominal mesenteric mass, mass in the low anatomic pelvis consistent with metastases Cycle 1 FOLFOX 02/21/2021 Cycle 2 FOLFOX 03/07/2021, dose reductions  made due to mild neutropenia Cycle 3 FOLFOX 03/21/2021 Cycle 4 FOLFOX  04/04/2021 Cycle 5 FOLFOX 04/19/2021 Cycle 6 FOLFOX 05/02/2021 CTs 05/12/2021-decrease size of nodular right mesenteric mass, complete resolution of soft tissue enhancing nodule in the right inguinal canal, resolution of nodular soft tissue mass posterior to the bladder, enlargement of a left upper lobe nodule and new 4 mm left upper lobe nodule-indeterminate Cycle 7 FOLFOX 05/16/2021 Cycle 8 FOLFOX 05/30/2021, oxaliplatin and 5-FU bolus held due to neuropathy and thrombocytopenia Cycle 9 FOLFOX 06/13/2021, oxaliplatin dose reduced Cycle 10 FOLFOX 06/27/2021 CT 07/07/2021-decrease in right mesenteric mass, decreased size of lobulated left upper lobe nodule, resolution of previous "new "left upper lobe nodule, new area of wall thickening at the distal transverse colon Treatment changed to Xeloda 2 weeks on/1 week off beginning 07/18/2021 Cycle 2 Xeloda beginning 08/09/2021 Cycle 3 Xeloda beginning 08/30/2021 Cycle 4 Xeloda beginning 09/21/2021 Cycle 5 Xeloda beginning 10/12/2021 Cycle 6 Xeloda beginning 11/02/2021 CTs 11/14/2021-right lower quadrant spiculated small bowel mesenteric mass measured 2.8 x 3.7 cm, previously 1.0 x 2.3 cm; adjacent mesenteric haziness and nodularity measuring up to 8 mm superiorly.  New 4 mm nodular lesion posterior segment right upper lobe along the major fissure. Treatment break beginning 11/17/2021 CTs 03/21/2022-multiple new liver metastases, enlarging implant in the right ileocolic mesentery, enlarging peritoneal implants in the low pelvis Cycle 1 FOLFIRI/Avastin 03/29/2022 Cycle 2 FOLFIRI/Avastin 04/11/2022 Cycle 3 FOLFIRI/Avastin 04/26/2021 2.   Microcytic anemia secondary to #1.    Resolved 3.   Family history of pancreas and colon cancer, negative genetic testing per the [facility] panel, ATM variant of unknown significance 4.   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

## 2022-05-10 NOTE — Patient Instructions (Signed)
Instrucciones al darle de alta: Discharge Instructions Gracias por elegir al [facility] de Cncer de [place] para brindarle atencin mdica de oncologa y Music therapist.   Si usted tiene una cita de laboratorio con [place], por favor vaya directamente [place] y regstrese en el rea de Control and instrumentation engineer.   Use ropa cmoda y [place] para tener fcil acceso a las vas del Portacath (acceso venoso de Engineer, site duracin) o la lnea PICC (catter central colocado por va perifrica).   Nos esforzamos por ofrecerle tiempo de calidad con su proveedor. Es posible que tenga que volver a programar su cita si llega tarde (15 minutos o ms).  El llegar tarde le afecta a usted y a otros pacientes cuyas citas son posteriores a Merchandiser, retail.  Adems, si usted falta a tres o ms citas sin avisar a la oficina, puede ser retirado(a) de la clnica a discrecin del proveedor.      Para las solicitudes de renovacin de recetas, pida a su farmacia que se ponga en contacto con nuestra oficina y deje que transcurran 59 horas para que se complete el proceso de las renovaciones.    Hoy usted recibi los siguientes agentes de quimioterapia e/o inmunoterapia Bevacizumab-awwb (MVASI), Irinotecan (CAMPTOSAR), Leucovorin & Flourouracil (ADRUCIL).      Para ayudar a prevenir las nuseas y los vmitos despus de su tratamiento, le recomendamos que tome su medicamento para las nuseas segn las indicaciones.  LOS SNTOMAS QUE DEBEN COMUNICARSE INMEDIATAMENTE SE INDICAN A CONTINUACIN: *FIEBRE SUPERIOR A 100.4 F (38 C) O MS *ESCALOFROS O SUDORACIN *NUSEAS Y VMITOS QUE NO SE CONTROLAN CON EL MEDICAMENTO PARA LAS NUSEAS *DIFICULTAD INUSUAL PARA RESPIRAR  *MORETONES O HEMORRAGIAS NO HABITUALES *PROBLEMAS URINARIOS (dolor o ardor al Garment/textile technologist o frecuencia para Garment/textile technologist) *PROBLEMAS INTESTINALES (diarrea inusual, estreimiento, dolor cerca del ano) SENSIBILIDAD EN LA BOCA Y EN LA GARGANTA CON O SIN LA PRESENCIA DE LCERAS  (dolor de garganta, llagas en la boca o dolor de muelas/dientes) ERUPCIN, HINCHAZN O DOLORES INUSUALES FLUJO VAGINAL INUSUAL O PICAZN/RASQUIA    Los puntos marcados con un asterisco ( *) indican una posible emergencia y debe hacer un seguimiento tan pronto como le sea posible o vaya al Departamento de Emergencias si se le presenta algn problema.  Por favor, muestre la [place] DE ADVERTENCIA DE Windy Canny DE ADVERTENCIA DE Benay Spice al registrarse en [address] de Emergencias y a la enfermera de triaje.  Si tiene preguntas despus de su visita o necesita cancelar o volver a programar su cita, por favor pngase en contacto con Proctor  Dept: [phone]  y [place] instrucciones. Las horas de oficina son de 8:00 a.m. a 4:30 p.m. de lunes a viernes. Por favor, tenga en cuenta que los mensajes de voz que se dejan despus de las 4:00 p.m. posiblemente no se devolvern hasta el siguiente da de [place].  Cerramos los fines de semana y [employer]. En todo momento tiene acceso a una enfermera para preguntas urgentes. Por favor, llame al nmero principal de la clnica Dept: [phone] y [place] instrucciones.   Para cualquier pregunta que no sea de carcter urgente, tambin puede ponerse en contacto con su proveedor [employer]. Ahora ofrecemos visitas electrnicas para cualquier persona mayor de 18 aos que solicite atencin mdica en lnea para los sntomas que no sean urgentes. Para ms detalles vaya a [email or web address].   Tambin puede bajar la aplicacin de MyChart! Vaya a la tienda  de aplicaciones, busque "MyChart", abra la aplicacin, seleccione [place], e ingrese con su nombre de usuario y la contrasea de Pharmacist, community.  Bevacizumab Injection Qu es este medicamento? El BEVACIZUMAB es un anticuerpo monoclonal. Se [place] para tratar muchos tipos de cncer. Este medicamento puede ser utilizado para otros  usos; si tiene alguna pregunta consulte con su proveedor de atencin mdica o con su farmacutico. MARCAS COMUNES: Alymsys, Avastin, MVASI, Jacob Moores le debo informar a mi profesional de la salud antes de tomar este medicamento? Necesitan saber si usted presenta alguno de los [employer] o situaciones: diabetes enfermedad cardiaca presin sangunea alta antecedentes de tos con sangre quimioterapia previa con antraciclinas (p. ej.: doxorubicina, daunorubicina, epirubicina) terapia de radiacin en curso o reciente ciruga reciente o planes para realizarse una ciruga accidente cerebrovascular una reaccin alrgica o inusual al bevacizumab, a las protenas de Production designer, theatre/television/film, a las protenas de ratn, a otros medicamentos, alimentos, Scientist, water quality o conservantes si est embarazada o buscando quedar embarazada si est amamantando a un beb Cmo debo [employer]? Este medicamento se administra mediante infusin en una vena. Lo administra un profesional de Technical sales engineer en un hospital o en un entorno clnico. Hable con su pediatra para informarse acerca del uso de este medicamento en nios. Puede requerir atencin especial. Sobredosis: Pngase en contacto inmediatamente con un centro toxicolgico o una sala de urgencia si usted cree que haya tomado demasiado medicamento. ATENCIN: [employer] es solo para usted. No comparta este medicamento con nadie. Qu sucede si me olvido de una dosis? Es importante no olvidar ninguna dosis. Informe a su mdico o a su profesional de la salud si no puede asistir a Photographer. Qu puede interactuar con este medicamento? No se anticipan interacciones. Puede ser que esta lista no menciona todas las posibles interacciones. Informe a su profesional de KB Home	[place] de [employer] Corporation productos a base de hierbas, medicamentos de [place] o suplementos nutritivos que est tomando. Si usted fuma, consume bebidas alcohlicas o si utiliza drogas ilegales,  indqueselo tambin a su profesional de KB Home	[place]. Algunas sustancias pueden interactuar con su medicamento. A qu debo estar atento al usar Coca-Cola? Se supervisar su estado de salud atentamente mientras reciba este medicamento. Tendr que hacerse anlisis de sangre importantes y [employer] de [place] mientras est tomando este medicamento. Este medicamento podra aumentar el riesgo de moretones o sangrado. Consulte a su mdico o a su profesional de la salud si observa sangrados inusuales. Antes de realizarse una ciruga, hable con su proveedor de atencin mdica para asegurarse de que no hay ningn problema. Este frmaco puede aumentar el riesgo de que el sitio o la herida quirrgica no sanen correctamente. Deber dejar de usar este frmaco durante [address] antes de la [place]. Despus de la ciruga, espere al menos [address] antes de reiniciar el uso de este frmaco. Asegrese de que el sitio o la herida quirrgica haya sanado lo suficiente antes de reiniciar el uso del frmaco. Hable con su proveedor de atencin mdica si tiene alguna pregunta. No debe quedar embarazada mientras est usando este medicamento o por 6 meses despus de dejar de usarlo. Las mujeres deben informar a su mdico si estn buscando quedar embarazadas o si creen que podran estar embarazadas. Existe la posibilidad de efectos secundarios graves en un beb sin nacer. Para obtener ms informacin, hable con su profesional de la salud o su farmacutico. No debe Economist a un beb mientras est usando este medicamento y durante 6 meses despus de  la ltima dosis. Este medicamento ha causado insuficiencia ovrica en [employer]. Este medicamento puede interferir con la capacidad de tener hijos. Usted debe hablar con su mdico o su profesional de la salud si est preocupado por su fertilidad. Qu efectos secundarios puedo tener al [employer] este medicamento? Efectos secundarios que debe informar a su mdico o a Barrister's clerk de la  salud tan pronto como sea posible: Chief of Staff, tales como erupcin cutnea, comezn/picazn o urticaria, e hinchazn de la cara, los labios o Advertising account planner u opresin en el pecho escalofros tos con sangre fiebre alta convulsiones estreimiento grave signos y sntomas de sangrado, tales como heces con sangre o de color negro y aspecto alquitranado; orina de color rojo o marrn oscuro; escupir sangre o material marrn que tiene el aspecto de posos (residuos) de caf; Tree surgeon rojas en la piel; sangrado o moretones inusuales en los ojos, las encas o la nariz signos y sntomas de un cogulo sanguneo, tales como problemas respiratorios; Social research officer, government en el pecho; dolor de cabeza grave, repentino; dolor, hinchazn, calor en la pierna signos y sntomas de un accidente cerebrovascular, tales como cambios en la visin; confusin; dificultad para hablar o entender; dolores de cabeza intensos; entumecimiento o debilidad repentina de la cara, el brazo o la pierna; problemas al Writer; Chief of Staff; prdida de equilibrio o coordinacin dolor estomacal sudoracin hinchazn de piernas o tobillos vmito aumento de peso Efectos secundarios que generalmente no requieren atencin mdica (infrmelos a su mdico o a Barrister's clerk de la salud si persisten o si son molestos): dolor de espalda cambios en el sentido del gusto disminucin del apetito piel seca nuseas cansancio Puede ser que esta lista no menciona todos los posibles efectos secundarios. Comunquese a su mdico por asesoramiento mdico Humana Inc. Usted puede informar los efectos secundarios a la FDA por telfono al 1-800-FDA-1088. Dnde debo guardar mi medicina? Este medicamento se administra en hospitales o clnicas, y no necesitar guardarlo en su domicilio. ATENCIN: Este folleto es un resumen. Puede ser que no cubra toda la posible informacin. Si usted tiene preguntas acerca de esta medicina, consulte con su mdico, su  farmacutico o su profesional de Technical sales engineer.  2023 Elsevier/Gold Standard (2020-11-10 00:00:00)  Irinotecan Injection Qu es este medicamento? El IRINOTECN es un agente quimioteraputico. Se utiliza en el tratamiento del cncer de colon y rectal. Este medicamento puede ser utilizado para otros usos; si tiene alguna pregunta consulte con su proveedor de atencin mdica o con su farmacutico. MARCAS COMUNES: Camptosar Qu le debo informar a mi profesional de la salud antes de tomar este medicamento? Necesitan saber si usted presenta alguno de los siguientes problemas o situaciones: deshidratacin diarrea infeccin (especialmente infecciones virales, como varicela, fuegos labiales o herpes) enfermedad heptica recuentos sanguneos bajos, como baja cantidad de glbulos blancos, plaquetas o glbulos rojos niveles bajos de calcio, magnesio o potasio en la sangre terapia de radiacin en curso o reciente una reaccin alrgica o inusual al irinotecn, a otros medicamentos, alimentos, colorantes o conservantes si est embarazada o buscando quedar embarazada si est amamantando a un beb Cmo debo utilizar este medicamento? Este medicamento se administra como infusin en una vena. Un profesional de la salud especialmente capacitado lo administra en un hospital o clnica. Hable con su pediatra para informarse acerca del uso de este medicamento en nios. Puede requerir atencin especial. Sobredosis: Pngase en contacto inmediatamente con un centro toxicolgico o una sala de urgencia si usted cree que haya tomado demasiado medicamento. ATENCIN: [employer] Foods es  solo para usted. No comparta este medicamento con nadie. Qu sucede si me olvido de una dosis? Es importante no olvidar ninguna dosis. Informe a su mdico o a su profesional de la salud si no puede asistir a Photographer. Qu puede interactuar con este medicamento? No use este medicamento con ninguno de los siguientes  frmacos: cobicistat itraconazol Este medicamento podra interactuar con los siguientes frmacos: medicamentos antivirales para el VIH o SIDA ciertos antibiticos, tales como rifampicina o rifabutina ciertos medicamentos para infecciones micticas, tales como ketoconazol, posaconazol y voriconazol ciertos medicamentos para convulsiones, tales como [place], fenobarbital y fenitona claritromicina gemfibrozil nefazodona hierba de [place] Puede ser que esta lista no menciona todas las posibles interacciones. Informe a su profesional de KB Home	[place] de [employer] Corporation productos a base de hierbas, medicamentos de [place] o suplementos nutritivos que est tomando. Si usted fuma, consume bebidas alcohlicas o si utiliza drogas ilegales, indqueselo tambin a su profesional de KB Home	[place]. Algunas sustancias pueden interactuar con su medicamento. A qu debo estar atento al usar Coca-Cola? Se supervisar su estado de salud atentamente mientras reciba este medicamento. Tendr que hacerse anlisis de sangre importantes mientras est usando este medicamento. Este medicamento podra hacerle sentir un Nurse, mental health. Esto es normal, ya que la quimioterapia puede afectar tanto a las clulas sanas como a las clulas cancerosas. Si presenta algn efecto secundario, infrmelo. Contine con el tratamiento aun si se siente enfermo, a menos que su mdico le indique que lo suspenda. En algunos casos, podra recibir [employer]ands para ayudarlo con los efectos secundarios. Siga todas las instrucciones para usarlos. Puede experimentar somnolencia o mareos. No conduzca, no utilice maquinaria ni haga nada que Associate Professor en estado de alerta hasta que sepa cmo le afecta este medicamento. No se siente ni se ponga de pie con rapidez, especialmente si es un paciente de edad avanzada. Esto reduce el riesgo de mareos o [employer]. Consulte a su profesional de la salud si tiene fiebre, escalofros, dolor  de garganta o cualquier otro sntoma de resfro o gripe. No se trate usted mismo. Este medicamento reduce la capacidad del cuerpo para combatir infecciones. Trate de no acercarse a personas que estn enfermas. Evite usar productos que contienen aspirina, acetaminofeno, ibuprofeno, naproxeno o ketoprofeno, a menos que as lo indique su mdico. Estos productos pueden ocultar la fiebre. Este medicamento podra aumentar el riesgo de moretones o sangrado. Consulte a su mdico o a su profesional de la salud si observa sangrados inusuales. Proceda con cuidado al cepillar sus dientes, usar hilo dental o Risk manager palillos para los dientes, ya que podra contraer una infeccin o Therapist, art con mayor facilidad. Si se somete a algn tratamiento dental, informe a su dentista que est [employer]. No debe quedar embarazada mientras est usando este medicamento o por 6 meses despus de dejar de usarlo. Las mujeres deben informar a su profesional de la salud si estn buscando quedar embarazadas o si creen que podran estar embarazadas. Los hombres no deben Social worker a Social research officer, government estn recibiendo Coca-Cola y [place] 3 meses despus de dejar de usarlo. Existe la posibilidad de que ocurran efectos secundarios graves en un beb sin nacer. Para obtener ms informacin, hable con su profesional de KB Home	[place]. No debe amamantar a un beb mientras est tomando Coca-Cola o por 7 das despus de dejar de usarlo. Este medicamento ha causado insuficiencia ovrica en [employer]. Este medicamento puede causar dificultades para [place]. Hable con su profesional de la  salud si le preocupa su fertilidad. Este medicamento ha causado recuentos de esperma reducidos en algunos hombres. Esto puede hacer ms difcil que un hombre embarace a Musician. Hable con su profesional de la salud si le preocupa su fertilidad. Qu efectos secundarios puedo tener al [employer] este medicamento? Efectos secundarios  que debe informar a su mdico o a Barrister's clerk de la salud tan pronto como sea posible: Chief of Staff, tales como erupcin cutnea, comezn/picazn o urticaria, e hinchazn de la cara, los labios o la Holiday representative en el pecho diarrea enrojecimiento, goteo nasal, sudoracin durante la infusin recuentos sanguneos bajos: este medicamento podra reducir la cantidad de glbulos blancos, glbulos rojos y plaquetas. Su riesgo de infeccin y sangrado podra ser mayor. nuseas, vmito dolor, hinchazn, calor en la pierna signos de disminucin en la cantidad de plaquetas o sangrado: moretones, puntos rojos en la piel, heces de color negro y aspecto alquitranado, sangre en la orina signos de infeccin: fiebre o escalofros, tos, dolor de Investment banker, operational, Social research officer, government o dificultad para orinar signos de disminucin en la cantidad de glbulos rojos: debilidad o cansancio inusuales, desmayos, aturdimiento Efectos secundarios que generalmente no requieren Geophysical data processor (infrmelos a su mdico o a Barrister's clerk de la salud si persisten o si son molestos): estreimiento cada del cabello dolor de cabeza prdida del apetito llagas en la boca dolor estomacal Puede ser que esta lista no menciona todos los posibles efectos secundarios. Comunquese a su mdico por asesoramiento mdico [employer]ana Inc. Usted puede informar los efectos secundarios a la FDA por telfono al 1-800-FDA-1088. Dnde debo guardar mi medicina? Este medicamento se administra en hospitales o clnicas, y no necesitar guardarlo en su domicilio. ATENCIN: Este folleto es un resumen. Puede ser que no cubra toda la posible informacin. Si usted tiene preguntas acerca de esta medicina, consulte con su mdico, su farmacutico o su profesional de Technical sales engineer.  2023 Elsevier/Gold Standard (2019-08-14 00:00:00)  Leucovorin Injection Qu es este medicamento? La LEUCOVORINA se [place] para prevenir o tratar [employer] nocivos de ciertos  medicamentos. Este medicamento tambin sirve para tratar la anemia provocada por una nivel bajo de cido flico en el cuerpo. Tambin se puede administrar con 5-fluorouracilo (5-FU), para tratar el cncer de colon. Este medicamento puede ser utilizado para otros usos; si tiene alguna pregunta consulte con su proveedor de atencin mdica o con su farmacutico. Qu le debo informar a mi profesional de la salud antes de tomar este medicamento? Necesita saber si usted presenta alguno de los siguientes problemas o situaciones: anemia debido a bajos niveles de vitamina B-12 en la sangre una reaccin alrgica o inusual a la leucovorina, cido flico, a otros medicamentos, alimentos, colorantes o conservantes si est embarazada o buscando quedar embarazada si est amamantando a un beb Cmo debo utilizar este medicamento? Este medicamento se administra mediante inyeccin por va intramuscular o intravenosa. Lo administra un profesional de Technical sales engineer en un hospital o en un entorno clnico. Hable con su pediatra para informarse acerca del uso de este medicamento en nios. Puede requerir atencin especial. Sobredosis: Pngase en contacto inmediatamente con un centro toxicolgico o una sala de urgencia si usted cree que haya tomado demasiado medicamento. ATENCIN: [employer] Foods es solo para usted. No comparta este medicamento con nadie. Qu sucede si me olvido de una dosis? No se aplica en este caso. Qu puede interactuar con este medicamento? capecitabina fluorouracilo fenobarbital fenitona primidona trimetoprima- sulfametoxasol Puede ser que esta lista no menciona todas las posibles interacciones. Informe a su  profesional de la salud de todos los productos a base de hierbas, medicamentos de venta libre o suplementos nutritivos que est tomando. Si usted fuma, consume bebidas alcohlicas o si utiliza drogas ilegales, indqueselo tambin a su profesional de KB Home	[place]. Algunas sustancias pueden  interactuar con su medicamento. A qu debo estar atento al usar Coca-Cola? Se supervisar su estado de salud atentamente mientras reciba este medicamento. Este medicamento puede aumentar los efectos secundarios de 5-fluorouracilo, 5-FU. Si tiene diarrea o [employer] boca que no mejoran o que [place], consulte a su mdico o a su profesional de KB Home	[place]. Qu efectos secundarios puedo tener al [employer] Corporation este medicamento? Efectos secundarios que debe informar a su mdico o a Barrister's clerk de la salud tan pronto como sea posible: Chief of Staff como erupcin cutnea, picazn o urticarias, hinchazn de la cara, labios o lengua problemas respiratorios fiebre, infeccin llagas en la boca sangrado, magulladuras inusuales cansancio o debilidad inusual Efectos secundarios que, por lo general, no requieren atencin mdica (debe informarlos a su mdico o a su profesional de la salud si persisten o si son molestos): estreimiento o diarrea prdida del apetito nuseas, vmito Puede ser que esta lista no menciona todos los posibles efectos secundarios. Comunquese a su mdico por asesoramiento mdico [employer]ana Inc. Usted puede informar los efectos secundarios a la FDA por telfono al 1-800-FDA-1088. Dnde debo guardar mi medicina? Este medicamento se administra en hospitales o clnicas y no necesitar guardarlo en su domicilio. ATENCIN: Este folleto es un resumen. Puede ser que no cubra toda la posible informacin. Si usted tiene preguntas acerca de esta medicina, consulte con su mdico, su farmacutico o su profesional de Technical sales engineer.  2023 Elsevier/Gold Standard (2014-06-02 00:00:00)  Fluorouracil Injection Qu es este medicamento? El [place], 5-FU es un agente quimioteraputico. Desacelera el crecimiento de clulas cancerosas. Este medicamento se [place] para tratar muchos tipos de cncer, tales como cncer de mama, cncer de colon o rectal, cncer de pncreas y cncer  de estmago. Este medicamento puede ser utilizado para otros usos; si tiene alguna pregunta consulte con su proveedor de atencin mdica o con su farmacutico. MARCAS COMUNES: Adrucil Qu le debo informar a mi profesional de la salud antes de tomar este medicamento? Necesitan saber si usted presenta alguno de los siguientes problemas o situaciones: trastornos sanguneos deficiencia de dihidropirimidina deshidrogenasa (DPD) infeccin (especialmente infecciones virales, como varicela, fuegos labiales o herpes) enfermedad renal enfermedad heptica desnutricin, nutricin deficiente terapia de radiacin en curso o reciente una reaccin alrgica o inusual al fluorouracilo, a otros medicamentos quimioteraputicos, a otros medicamentos, alimentos, colorantes o conservantes si est embarazada o buscando quedar embarazada si est amamantando a un beb Cmo debo utilizar este medicamento? Este medicamento se administra como infusin o inyeccin en una vena. Un profesional de la salud especialmente capacitado lo administra en un hospital o clnica. Hable con su pediatra para informarse acerca del uso de este medicamento en nios. Puede requerir atencin especial. Sobredosis: Pngase en contacto inmediatamente con un centro toxicolgico o una sala de urgencia si usted cree que haya tomado demasiado medicamento. ATENCIN: [employer] es solo para usted. No comparta este medicamento con nadie. Qu sucede si me olvido de una dosis? Es importante no olvidar ninguna dosis. Informe a su mdico o a su profesional de la salud si no puede asistir a Photographer. Qu puede interactuar con este medicamento? No use este medicamento con ninguno de los siguientes frmacos: vacunas de virus vivos Este medicamento tambin puede Counselling psychologist con  los siguientes frmacos: medicamentos que tratan o previenen cogulos sanguneos, tales como warfarina, enoxaparina y dalteparina Puede ser que esta lista no menciona  todas las posibles interacciones. Informe a su profesional de KB Home	[place] de [employer] Corporation productos a base de hierbas, medicamentos de [place] o suplementos nutritivos que est tomando. Si usted fuma, consume bebidas alcohlicas o si utiliza drogas ilegales, indqueselo tambin a su profesional de KB Home	[place]. Algunas sustancias pueden interactuar con su medicamento. A qu debo estar atento al usar Coca-Cola? Visite a su mdico para que revise su evolucin. Este medicamento podra hacerle sentir un Nurse, mental health. Esto es normal, ya que la quimioterapia puede afectar tanto a las clulas sanas como a las clulas cancerosas. Si presenta algn efecto secundario, infrmelo. Contine con el tratamiento aun si se siente enfermo, a menos que su mdico le indique que lo suspenda. En algunos casos, podra recibir [employer] para ayudarlo con los efectos secundarios. Siga todas las instrucciones para usarlos. Llame a su mdico o a su profesional de la salud si tiene fiebre, escalofros o dolor de garganta, o cualquier otro sntoma de resfro o gripe. No se trate usted mismo. Este medicamento reduce la capacidad del cuerpo para combatir infecciones. Trate de no acercarse a personas que estn enfermas. Este medicamento podra aumentar el riesgo de moretones o sangrado. Consulte a su mdico o a su profesional de la salud si observa sangrados inusuales. Proceda con cuidado al cepillar sus dientes, usar hilo dental o Risk manager palillos para los dientes, ya que podra contraer una infeccin o Therapist, art con mayor facilidad. Si se somete a algn tratamiento dental, informe a su dentista que est [employer]. Evite usar productos que contienen aspirina, acetaminofeno, ibuprofeno, naproxeno o ketoprofeno, a menos que as lo indique su mdico. Estos productos pueden ocultar la fiebre. No debe quedar embarazada mientras est [employer]. Las mujeres deben informar a su mdico si estn  buscando quedar embarazadas o si creen que podran estar embarazadas. Existe la posibilidad de efectos secundarios graves en un beb sin nacer. Para obtener ms informacin, hable con su profesional de la salud o su farmacutico. No debe Economist a un beb mientras est usando este medicamento. Los hombres deben informar a su mdico si quieren tener hijos. Este medicamento puede reducir el recuento de esperma. No trate la diarrea con productos de [employer]. Contacte a su mdico si tiene diarrea por ms de 2 das, o si es grave y [place]. Este medicamento puede aumentar su sensibilidad al sol. Evite la [employer]. Si no la Product manager, utilice ropa protectora y crema de Photographer. No utilice lmparas solares, camas solares ni cabinas solares. Qu efectos secundarios puedo tener al [employer] este medicamento? Efectos secundarios que debe informar a su mdico o a Barrister's clerk de la salud tan pronto como sea posible: Chief of Staff, tales como erupcin cutnea, comezn/picazn o urticaria, e hinchazn de la cara, los labios o la lengua recuentos sanguneos bajos: este medicamento podra reducir la cantidad de glbulos blancos, glbulos rojos y plaquetas. Su riesgo de infeccin y sangrado podra ser mayor. signos de infeccin: fiebre o escalofros, tos, dolor de garganta, dolor o dificultad para orinar signos de disminucin en la cantidad de plaquetas o sangrado: moretones, puntos rojos en la piel, heces de color negro y aspecto alquitranado, sangre en la orina signos de disminucin en la cantidad de glbulos rojos: debilidad o cansancio inusuales, Youth worker, aturdimiento problemas para respirar cambios en la visin Market researcher  en la boca nuseas y vmito dolor, hinchazn, enrojecimiento en el lugar de Air cabin crew, hormigueo o entumecimiento de las manos o los pies enrojecimiento, hinchazn o Pension scheme manager en las manos o los pies dolor estomacal sangrado inusual Efectos  secundarios que generalmente no requieren atencin mdica (infrmelos a su mdico o a Barrister's clerk de la salud si persisten o si son molestos): cambios en las uas de los pies o de las manos diarrea comezn/picazn o sequedad de la piel cada del cabello dolor de cabeza prdida del apetito sensibilidad de los ojos a la luz Tree surgeon estomacal ojos inusualmente llorosos Puede ser que esta lista no menciona todos los posibles efectos secundarios. Comunquese a su mdico por asesoramiento mdico [employer] Usted puede informar los efectos secundarios a la FDA por telfono al 1-800-FDA-1088. Dnde debo guardar mi medicina? Este medicamento se administra en hospitales o clnicas, y no necesitar guardarlo en su domicilio. ATENCIN: Este folleto es un resumen. Puede ser que no cubra toda la posible informacin. Si usted tiene preguntas acerca de esta medicina, consulte con su mdico, su farmacutico o su profesional de Technical sales engineer.  2023 Elsevier/Gold Standard (2019-08-14 00:00:00)  The chemotherapy medication bag should finish at 46 hours, 96 hours, or 7 days. For example, if your pump is scheduled for 46 hours and it was put on at 4:00 p.m., it should finish at 2:00 p.m. the day it is scheduled to come off regardless of your appointment time.     Estimated time to finish at 10:15 a.m. on Friday 05/12/2022.   If the display on your pump reads "Low Volume" and it is beeping, take the batteries out of the pump and come to the cancer center for it to be taken off.   If the pump alarms go off prior to the pump reading "Low Volume" then call [phone] and someone can assist you.  If the plunger comes out and the chemotherapy medication is leaking out, please use your home chemo spill kit to clean up the spill. Do NOT use paper towels or other household products.  If you have problems or questions regarding your pump, please call either [phone] (24 hours a day) or the  cancer center Monday-Friday 8:00 a.m.- 4:30 p.m. at the clinic number and we will assist you. If you are unable to get assistance, then go to the nearest Emergency Department and ask the staff to contact the IV team for assistance.

## 2022-05-10 NOTE — Progress Notes (Signed)
Patient was assisted today during his Flush appointment and 20 minutes of his infusion appointment by interpreter Truitt Merle from [place].

## 2022-05-10 NOTE — Progress Notes (Signed)
[facility] OFFICE PROGRESS NOTE   Diagnosis:  Colon cancer  INTERVAL HISTORY:   John Vance returns as scheduled.  He completed cycle 3 FOLFIRI/Avastin 04/26/2022.  He denies nausea/vomiting.  No mouth sores.  No diarrhea.  He denies bleeding.  He denies pain.  He has a good appetite.  He notes mild pruritus on the arms and legs.  Scalp rash has resolved.  Objective:  Vital signs in last 24 hours:  Blood pressure (!) 158/71, pulse 75, temperature 98.1 F (36.7 C), temperature source Tympanic, resp. rate 18, weight 172 lb (78 kg), SpO2 100 %.    HEENT: No thrush or ulcers. Resp: Lungs clear bilaterally. Cardio: Regular rate and rhythm. GI: Abdomen soft and nontender.  No hepatosplenomegaly.  No mass. Vascular: No leg edema. Skin: Palms without erythema.  No rash noted lower arms, lower legs. Port-A-Cath without erythema.   Lab Results:  Lab Results  Component Value Date   WBC 4.2 05/10/2022   HGB 12.8 (L) 05/10/2022   HCT 38.3 (L) 05/10/2022   MCV 86.1 05/10/2022   PLT 153 05/10/2022   NEUTROABS 2.3 05/10/2022    Imaging:  No results found.  Medications: I have reviewed the patient's current medications.  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), transverse colon cancer stage IIB (T4aN0) status post extended right hemicolectomy 05/07/2019, MSS Tumor invasive superficial portion of the muscle ("internal third "), no tumor perforation, no vascular invasion or perineural invasion, negative resection margins, 0/4 lymph nodes Elevated CEA 02/10/2019 CTs 03/05/2019, compared to outside CTs from 11/22/2018-worsened wall thickening in the cecum stable apple core lesion in the mid transverse colon, no evidence of recurrent tumor at the distal colonic anastomosis, no evidence of metastatic disease Colonoscopy 03/24/2019 20-2 malignant appearing masses  noted in the colon, 1 filled the cecum measuring approximately 4 cm across and friable.  The other malignant appearing mass was circumferential creating a stricture with a 1.2 cm lumen located in the transverse segment approximately 4 to 5 cm in length.  There were 12-18 neoplastic appearing polyps in between the 2 obvious malignant masses ranging in size from 3 mm to 2 cm.  5 polyps distal to the transverse colon mass.  2 sigmoid colon polyps.  Cecum mass positive for adenocarcinoma.  Transverse colon mass positive for adenocarcinoma. Foundation 1 transverse colon-MSS, tumor mutation burden 7, KRAS wild-type, BRAF fusion 05/07/2019 laparoscopic extended right hemicolectomy, lysis of adhesions-5 cm invasive moderately differentiated adenocarcinoma involving cecum and proximal ascending colon, carcinoma invades into the pericolonic soft tissue; 7 cm invasive moderately differentiated carcinoma involving the transverse colon, carcinoma invades into the serosal surface (visceral peritoneum); all resection margins negative for carcinoma.  Lymphovascular invasion present.  In the proximal colon metastatic carcinoma involves 3 of 17 lymph nodes with 1 tumor deposit.  In the transverse colon 12 lymph nodes negative for metastatic carcinoma.  3 separate polyps: Tubular adenoma, inflammatory polyp and hyperplastic polyp Cycle 1 capecitabine 06/02/2019 Cycle 2 capecitabine 06/23/2019 Cycle 3 capecitabine 07/14/2019 Cycle 4 capecitabine 08/04/2019 Cycle 5 capecitabine 08/25/2019 Cycle 6 capecitabine 09/15/2019 Cycle 7 capecitabine 10/06/2019 Cycle 8 capecitabine 10/27/2019 Upper endoscopy 07/23/2020-negative Colonoscopy 07/23/2020-normal-appearing anastomoses, polyp removed from the descending colon-tubular adenoma CTs 01/18/2021-right abdominal mesenteric mass, mass in the low anatomic pelvis consistent with metastases Cycle 1 FOLFOX 02/21/2021 Cycle 2 FOLFOX 03/07/2021, dose reductions made due to mild neutropenia Cycle 3  FOLFOX 03/21/2021 Cycle 4 FOLFOX  04/04/2021 Cycle 5 FOLFOX 04/19/2021 Cycle 6 FOLFOX 05/02/2021 CTs 05/12/2021-decrease size of nodular right mesenteric mass, complete resolution of soft tissue enhancing nodule in the right inguinal canal, resolution of nodular soft tissue mass posterior to the bladder, enlargement of a left upper lobe nodule and new 4 mm left upper lobe nodule-indeterminate Cycle 7 FOLFOX 05/16/2021 Cycle 8 FOLFOX 05/30/2021, oxaliplatin and 5-FU bolus held due to neuropathy and thrombocytopenia Cycle 9 FOLFOX 06/13/2021, oxaliplatin dose reduced Cycle 10 FOLFOX 06/27/2021 CT 07/07/2021-decrease in right mesenteric mass, decreased size of lobulated left upper lobe nodule, resolution of previous "new "left upper lobe nodule, new area of wall thickening at the distal transverse colon Treatment changed to Xeloda 2 weeks on/1 week off beginning 07/18/2021 Cycle 2 Xeloda beginning 08/09/2021 Cycle 3 Xeloda beginning 08/30/2021 Cycle 4 Xeloda beginning 09/21/2021 Cycle 5 Xeloda beginning 10/12/2021 Cycle 6 Xeloda beginning 11/02/2021 CTs 11/14/2021-right lower quadrant spiculated small bowel mesenteric mass measured 2.8 x 3.7 cm, previously 1.0 x 2.3 cm; adjacent mesenteric haziness and nodularity measuring up to 8 mm superiorly.  New 4 mm nodular lesion posterior segment right upper lobe along the major fissure. Treatment break beginning 11/17/2021 CTs 03/21/2022-multiple new liver metastases, enlarging implant in the right ileocolic mesentery, enlarging peritoneal implants in the low pelvis Cycle 1 FOLFIRI/Avastin 03/29/2022 Cycle 2 FOLFIRI/Avastin 04/11/2022 Cycle 3 FOLFIRI/Avastin 04/26/2022 Cycle 4 FOLFIRI/Avastin 05/10/2022 2.   Microcytic anemia secondary to #1.    Resolved 3.   Family history of pancreas and colon cancer, negative genetic testing per the [facility] panel, ATM variant of unknown significance 4.   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  He has completed 3 cycles of FOLFIRI/Avastin.  He continues to tolerate treatment well.  Plan to proceed with cycle 4 today as scheduled.  CBC and chemistry panel reviewed.  Labs adequate to proceed as above.  The pruritus on the arms and legs is likely related to 5-fluorouracil, seasonal dry skin.  No rash noted.  He will apply lotion liberally.  He will return for lab, follow-up, cycle 5 FOLFIRI/Avastin in 2 weeks.  We are available to see him sooner if needed.    Ned Card ANP/GNP-BC   05/10/2022  8:47 AM

## 2022-05-12 ENCOUNTER — Inpatient Hospital Stay: Payer: Commercial Managed Care - HMO

## 2022-05-12 ENCOUNTER — Other Ambulatory Visit: Payer: Self-pay

## 2022-05-12 VITALS — BP 153/68 | HR 62 | Temp 97.7°F | Resp 18

## 2022-05-12 DIAGNOSIS — C189 Malignant neoplasm of colon, unspecified: Secondary | ICD-10-CM

## 2022-05-12 DIAGNOSIS — Z5111 Encounter for antineoplastic chemotherapy: Secondary | ICD-10-CM | POA: Diagnosis not present

## 2022-05-12 MED ORDER — SODIUM CHLORIDE 0.9% FLUSH
10.0000 mL | INTRAVENOUS | Status: DC | PRN
  Administered 2022-05-12: 10 mL

## 2022-05-12 MED ORDER — HEPARIN SOD (PORK) LOCK FLUSH 100 UNIT/ML IV SOLN
500.0000 [IU] | Freq: Once | INTRAVENOUS | Status: AC | PRN
  Administered 2022-05-12: 500 [IU]

## 2022-05-12 NOTE — Patient Instructions (Signed)

## 2022-05-21 ENCOUNTER — Other Ambulatory Visit: Payer: Self-pay | Admitting: Oncology

## 2022-05-24 ENCOUNTER — Inpatient Hospital Stay: Payer: Commercial Managed Care - HMO

## 2022-05-24 ENCOUNTER — Encounter: Payer: Self-pay | Admitting: *Deleted

## 2022-05-24 ENCOUNTER — Inpatient Hospital Stay: Payer: Commercial Managed Care - HMO | Admitting: Oncology

## 2022-05-24 VITALS — BP 163/67 | HR 63

## 2022-05-24 DIAGNOSIS — C189 Malignant neoplasm of colon, unspecified: Secondary | ICD-10-CM | POA: Diagnosis not present

## 2022-05-24 DIAGNOSIS — Z5111 Encounter for antineoplastic chemotherapy: Secondary | ICD-10-CM | POA: Diagnosis not present

## 2022-05-24 LAB — CBC WITH DIFFERENTIAL (CANCER CENTER ONLY)
Abs Immature Granulocytes: 0.02 10*3/uL (ref 0.00–0.07)
Basophils Absolute: 0 10*3/uL (ref 0.0–0.1)
Basophils Relative: 0 %
Eosinophils Absolute: 0.1 10*3/uL (ref 0.0–0.5)
Eosinophils Relative: 3 %
HCT: 39.8 % (ref 39.0–52.0)
Hemoglobin: 13.3 g/dL (ref 13.0–17.0)
Immature Granulocytes: 0 %
Lymphocytes Relative: 26 %
Lymphs Abs: 1.3 10*3/uL (ref 0.7–4.0)
MCH: 28.8 pg (ref 26.0–34.0)
MCHC: 33.4 g/dL (ref 30.0–36.0)
MCV: 86.1 fL (ref 80.0–100.0)
Monocytes Absolute: 0.7 10*3/uL (ref 0.1–1.0)
Monocytes Relative: 14 %
Neutro Abs: 2.7 10*3/uL (ref 1.7–7.7)
Neutrophils Relative %: 57 %
Platelet Count: 163 10*3/uL (ref 150–400)
RBC: 4.62 MIL/uL (ref 4.22–5.81)
RDW: 16.3 % — ABNORMAL HIGH (ref 11.5–15.5)
WBC Count: 4.8 10*3/uL (ref 4.0–10.5)
nRBC: 0 % (ref 0.0–0.2)

## 2022-05-24 LAB — CMP (CANCER CENTER ONLY)
ALT: 12 U/L (ref 0–44)
AST: 18 U/L (ref 15–41)
Albumin: 4.2 g/dL (ref 3.5–5.0)
Alkaline Phosphatase: 76 U/L (ref 38–126)
Anion gap: 7 (ref 5–15)
BUN: 17 mg/dL (ref 8–23)
CO2: 26 mmol/L (ref 22–32)
Calcium: 9.7 mg/dL (ref 8.9–10.3)
Chloride: 107 mmol/L (ref 98–111)
Creatinine: 1.01 mg/dL (ref 0.61–1.24)
GFR, Estimated: >60 mL/min (ref >60)
Glucose, Bld: 107 mg/dL — ABNORMAL HIGH (ref 70–99)
Potassium: 4.2 mmol/L (ref 3.5–5.1)
Sodium: 140 mmol/L (ref 135–145)
Total Bilirubin: 0.5 mg/dL (ref 0.3–1.2)
Total Protein: 7.3 g/dL (ref 6.5–8.1)

## 2022-05-24 LAB — TOTAL PROTEIN, URINE DIPSTICK: Protein, ur: NEGATIVE mg/dL

## 2022-05-24 LAB — CEA (ACCESS): CEA (CHCC): 10.56 ng/mL — ABNORMAL HIGH (ref 0.00–5.00)

## 2022-05-24 MED ORDER — SODIUM CHLORIDE 0.9 % IV SOLN
300.0000 mg/m2 | Freq: Once | INTRAVENOUS | Status: AC
  Administered 2022-05-24: 580 mg via INTRAVENOUS
  Filled 2022-05-24: qty 29

## 2022-05-24 MED ORDER — SODIUM CHLORIDE 0.9 % IV SOLN
2000.0000 mg/m2 | INTRAVENOUS | Status: DC
  Administered 2022-05-24: 3850 mg via INTRAVENOUS
  Filled 2022-05-24: qty 77

## 2022-05-24 MED ORDER — SODIUM CHLORIDE 0.9 % IV SOLN
5.0000 mg/kg | Freq: Once | INTRAVENOUS | Status: AC
  Administered 2022-05-24: 400 mg via INTRAVENOUS
  Filled 2022-05-24: qty 16

## 2022-05-24 MED ORDER — SODIUM CHLORIDE 0.9 % IV SOLN
10.0000 mg | Freq: Once | INTRAVENOUS | Status: AC
  Administered 2022-05-24: 10 mg via INTRAVENOUS
  Filled 2022-05-24: qty 10

## 2022-05-24 MED ORDER — SODIUM CHLORIDE 0.9 % IV SOLN: Freq: Once | INTRAVENOUS | Status: AC

## 2022-05-24 MED ORDER — FLUOROURACIL CHEMO INJECTION 2.5 GM/50ML
300.0000 mg/m2 | Freq: Once | INTRAVENOUS | Status: AC
  Administered 2022-05-24: 600 mg via INTRAVENOUS
  Filled 2022-05-24: qty 12

## 2022-05-24 MED ORDER — ATROPINE SULFATE 1 MG/ML IV SOLN
0.5000 mg | Freq: Once | INTRAVENOUS | Status: AC | PRN
  Administered 2022-05-24: 0.5 mg via INTRAVENOUS
  Filled 2022-05-24: qty 1

## 2022-05-24 MED ORDER — PALONOSETRON HCL INJECTION 0.25 MG/5ML
0.2500 mg | Freq: Once | INTRAVENOUS | Status: AC
  Administered 2022-05-24: 0.25 mg via INTRAVENOUS
  Filled 2022-05-24: qty 5

## 2022-05-24 MED ORDER — SODIUM CHLORIDE 0.9 % IV SOLN
180.0000 mg/m2 | Freq: Once | INTRAVENOUS | Status: AC
  Administered 2022-05-24: 340 mg via INTRAVENOUS
  Filled 2022-05-24: qty 15

## 2022-05-24 NOTE — Progress Notes (Signed)
[facility] OFFICE PROGRESS NOTE   Diagnosis: Colon cancer  INTERVAL HISTORY:   [name] completed another cycle of FOLFIRI/bevacizumab on 05/10/2022.  No nausea/vomiting, mouth sores, or diarrhea.  He feels well.  Good appetite.  His only complaint is insomnia.  He is here today with his daughter and a Spanish interpreter.  Objective:  Vital signs in last 24 hours:  Blood pressure (!) 147/73, pulse 94, temperature 98.2 F (36.8 C), temperature source Oral, resp. rate 18, height '5\' 8"'$  (1.727 m), weight 171 lb 12.8 oz (77.9 kg), SpO2 96 %.    HEENT: No thrush or ulcers Resp: Lungs clear bilaterally Cardio: Regular rate and rhythm GI: Nontender, no mass, no hepatosplenomegaly Vascular: No leg edema  Skin: Palms without erythema, mild hyperpigmentation  Portacath/PICC-without erythema  Lab Results:  Lab Results  Component Value Date   WBC 4.8 05/24/2022   HGB 13.3 05/24/2022   HCT 39.8 05/24/2022   MCV 86.1 05/24/2022   PLT 163 05/24/2022   NEUTROABS 2.7 05/24/2022    CMP  Lab Results  Component Value Date   NA 139 05/10/2022   K 4.0 05/10/2022   CL 107 05/10/2022   CO2 26 05/10/2022   GLUCOSE 113 (H) 05/10/2022   BUN 19 05/10/2022   CREATININE 0.98 05/10/2022   CALCIUM 9.4 05/10/2022   PROT 6.9 05/10/2022   ALBUMIN 4.0 05/10/2022   AST 17 05/10/2022   ALT 11 05/10/2022   ALKPHOS 73 05/10/2022   BILITOT 0.4 05/10/2022   GFRNONAA >60 05/10/2022   GFRAA >60 12/01/2019    Lab Results  Component Value Date   CEA1 12.10 (H) 01/03/2021   CEA 10.56 (H) 05/24/2022    Medications: I have reviewed the patient's current medications.   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), transverse colon cancer stage IIB (T4aN0) status post extended right hemicolectomy 05/07/2019, MSS Tumor invasive superficial portion of the muscle  ("internal third "), no tumor perforation, no vascular invasion or perineural invasion, negative resection margins, 0/4 lymph nodes Elevated CEA 02/10/2019 CTs 03/05/2019, compared to outside CTs from 11/22/2018-worsened wall thickening in the cecum stable apple core lesion in the mid transverse colon, no evidence of recurrent tumor at the distal colonic anastomosis, no evidence of metastatic disease Colonoscopy 03/24/2019 20-2 malignant appearing masses noted in the colon, 1 filled the cecum measuring approximately 4 cm across and friable.  The other malignant appearing mass was circumferential creating a stricture with a 1.2 cm lumen located in the transverse segment approximately 4 to 5 cm in length.  There were 12-18 neoplastic appearing polyps in between the 2 obvious malignant masses ranging in size from 3 mm to 2 cm.  5 polyps distal to the transverse colon mass.  2 sigmoid colon polyps.  Cecum mass positive for adenocarcinoma.  Transverse colon mass positive for adenocarcinoma. Foundation 1 transverse colon-MSS, tumor mutation burden 7, KRAS wild-type, BRAF fusion 05/07/2019 laparoscopic extended right hemicolectomy, lysis of adhesions-5 cm invasive moderately differentiated adenocarcinoma involving cecum and proximal ascending colon, carcinoma invades into the pericolonic soft tissue; 7 cm invasive moderately differentiated carcinoma involving the transverse colon, carcinoma invades into the serosal surface (visceral peritoneum); all resection margins negative for carcinoma.  Lymphovascular invasion present.  In the proximal colon metastatic carcinoma involves 3 of 17 lymph nodes with 1 tumor deposit.  In the transverse colon 12 lymph nodes negative for metastatic carcinoma.  3  separate polyps: Tubular adenoma, inflammatory polyp and hyperplastic polyp Cycle 1 capecitabine 06/02/2019 Cycle 2 capecitabine 06/23/2019 Cycle 3 capecitabine 07/14/2019 Cycle 4 capecitabine 08/04/2019 Cycle 5 capecitabine  08/25/2019 Cycle 6 capecitabine 09/15/2019 Cycle 7 capecitabine 10/06/2019 Cycle 8 capecitabine 10/27/2019 Upper endoscopy 07/23/2020-negative Colonoscopy 07/23/2020-normal-appearing anastomoses, polyp removed from the descending colon-tubular adenoma CTs 01/18/2021-right abdominal mesenteric mass, mass in the low anatomic pelvis consistent with metastases Cycle 1 FOLFOX 02/21/2021 Cycle 2 FOLFOX 03/07/2021, dose reductions made due to mild neutropenia Cycle 3 FOLFOX 03/21/2021 Cycle 4 FOLFOX 04/04/2021 Cycle 5 FOLFOX 04/19/2021 Cycle 6 FOLFOX 05/02/2021 CTs 05/12/2021-decrease size of nodular right mesenteric mass, complete resolution of soft tissue enhancing nodule in the right inguinal canal, resolution of nodular soft tissue mass posterior to the bladder, enlargement of a left upper lobe nodule and new 4 mm left upper lobe nodule-indeterminate Cycle 7 FOLFOX 05/16/2021 Cycle 8 FOLFOX 05/30/2021, oxaliplatin and 5-FU bolus held due to neuropathy and thrombocytopenia Cycle 9 FOLFOX 06/13/2021, oxaliplatin dose reduced Cycle 10 FOLFOX 06/27/2021 CT 07/07/2021-decrease in right mesenteric mass, decreased size of lobulated left upper lobe nodule, resolution of previous "new "left upper lobe nodule, new area of wall thickening at the distal transverse colon Treatment changed to Xeloda 2 weeks on/1 week off beginning 07/18/2021 Cycle 2 Xeloda beginning 08/09/2021 Cycle 3 Xeloda beginning 08/30/2021 Cycle 4 Xeloda beginning 09/21/2021 Cycle 5 Xeloda beginning 10/12/2021 Cycle 6 Xeloda beginning 11/02/2021 CTs 11/14/2021-right lower quadrant spiculated small bowel mesenteric mass measured 2.8 x 3.7 cm, previously 1.0 x 2.3 cm; adjacent mesenteric haziness and nodularity measuring up to 8 mm superiorly.  New 4 mm nodular lesion posterior segment right upper lobe along the major fissure. Treatment break beginning 11/17/2021 CTs 03/21/2022-multiple new liver metastases, enlarging implant in the right ileocolic mesentery,  enlarging peritoneal implants in the low pelvis Cycle 1 FOLFIRI/Avastin 03/29/2022 Cycle 2 FOLFIRI/Avastin 04/11/2022 Cycle 3 FOLFIRI/Avastin 04/26/2022 Cycle 4 FOLFIRI/Avastin 05/10/2022 Cycle 5 FOLFIRI/Avastin 05/24/2022 2.   Microcytic anemia secondary to #1.    Resolved 3.   Family history of pancreas and colon cancer, negative genetic testing per the [facility] panel, ATM variant of unknown significance 4.   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  He has tolerated the treatment well.  He will complete cycle 5 today.  He will undergo restaging CT evaluation prior to an office visit in 2 weeks.  Betsy Coder, MD  05/24/2022  9:18 AM

## 2022-05-24 NOTE — Patient Instructions (Signed)
Instrucciones al darle de alta: Discharge Instructions Gracias por elegir al [facility] de Cncer de [place] para brindarle atencin mdica de oncologa y Music therapist.   Si usted tiene una cita de laboratorio con [place], por favor vaya directamente [place] y regstrese en el rea de Control and instrumentation engineer.   Use ropa cmoda y [place] para tener fcil acceso a las vas del Portacath (acceso venoso de Engineer, site duracin) o la lnea PICC (catter central colocado por va perifrica).   Nos esforzamos por ofrecerle tiempo de calidad con su proveedor. Es posible que tenga que volver a programar su cita si llega tarde (15 minutos o ms).  El llegar tarde le afecta a usted y a otros pacientes cuyas citas son posteriores a Merchandiser, retail.  Adems, si usted falta a tres o ms citas sin avisar a la oficina, puede ser retirado(a) de la clnica a discrecin del proveedor.      Para las solicitudes de renovacin de recetas, pida a su farmacia que se ponga en contacto con nuestra oficina y deje que transcurran 34 horas para que se complete el proceso de las renovaciones.    Hoy usted recibi los siguientes agentes de quimioterapia e/o inmunoterapia Bevacizumab-awwb (MVASI), Irinotecan (CAMPTOSAR), Leucovorin & Flourouracil (ADRUCIL).      Para ayudar a prevenir las nuseas y los vmitos despus de su tratamiento, le recomendamos que tome su medicamento para las nuseas segn las indicaciones.  LOS SNTOMAS QUE DEBEN COMUNICARSE INMEDIATAMENTE SE INDICAN A CONTINUACIN: *FIEBRE SUPERIOR A 100.4 F (38 C) O MS *ESCALOFROS O SUDORACIN *NUSEAS Y VMITOS QUE NO SE CONTROLAN CON EL MEDICAMENTO PARA LAS NUSEAS *DIFICULTAD INUSUAL PARA RESPIRAR  *MORETONES O HEMORRAGIAS NO HABITUALES *PROBLEMAS URINARIOS (dolor o ardor al Garment/textile technologist o frecuencia para Garment/textile technologist) *PROBLEMAS INTESTINALES (diarrea inusual, estreimiento, dolor cerca del ano) SENSIBILIDAD EN LA BOCA Y EN LA GARGANTA CON O SIN LA PRESENCIA DE LCERAS  (dolor de garganta, llagas en la boca o dolor de muelas/dientes) ERUPCIN, HINCHAZN O DOLORES INUSUALES FLUJO VAGINAL INUSUAL O PICAZN/RASQUIA    Los puntos marcados con un asterisco ( *) indican una posible emergencia y debe hacer un seguimiento tan pronto como le sea posible o vaya al Departamento de Emergencias si se le presenta algn problema.  Por favor, muestre la [place] DE ADVERTENCIA DE Windy Canny DE ADVERTENCIA DE Benay Spice al registrarse en [address] de Emergencias y a la enfermera de triaje.  Si tiene preguntas despus de su visita o necesita cancelar o volver a programar su cita, por favor pngase en contacto con [place]  Dept: [phone]  y [place] instrucciones. Las horas de oficina son de 8:00 a.m. a 4:30 p.m. de lunes a viernes. Por favor, tenga en cuenta que los mensajes de voz que se dejan despus de las 4:00 p.m. posiblemente no se devolvern hasta el siguiente da de [place].  Cerramos los fines de semana y [employer]. En todo momento tiene acceso a una enfermera para preguntas urgentes. Por favor, llame al nmero principal de la clnica Dept: [phone] y [place] instrucciones.   Para cualquier pregunta que no sea de carcter urgente, tambin puede ponerse en contacto con su proveedor [employer]. Ahora ofrecemos visitas electrnicas para cualquier persona mayor de 18 aos que solicite atencin mdica en lnea para los sntomas que no sean urgentes. Para ms detalles vaya a [email or web address].   Tambin puede bajar la aplicacin de MyChart! Vaya a la  tienda de aplicaciones, busque "MyChart", abra la aplicacin, seleccione [place], e ingrese con su nombre de usuario y la contrasea de Pharmacist, community.  Bevacizumab Injection Qu es este medicamento? El BEVACIZUMAB es un anticuerpo monoclonal. Se [place] para tratar muchos tipos de cncer. Este medicamento puede ser utilizado para  otros usos; si tiene alguna pregunta consulte con su proveedor de atencin mdica o con su farmacutico. MARCAS COMUNES: Alymsys, Avastin, MVASI, Jacob Moores le debo informar a mi profesional de la salud antes de tomar este medicamento? Necesitan saber si usted presenta alguno de los [employer] o situaciones: diabetes enfermedad cardiaca presin sangunea alta antecedentes de tos con sangre quimioterapia previa con antraciclinas (p. ej.: doxorubicina, daunorubicina, epirubicina) terapia de radiacin en curso o reciente ciruga reciente o planes para realizarse una ciruga accidente cerebrovascular una reaccin alrgica o inusual al bevacizumab, a las protenas de Production designer, theatre/television/film, a las protenas de ratn, a otros medicamentos, alimentos, Scientist, water quality o conservantes si est embarazada o buscando quedar embarazada si est amamantando a un beb Cmo debo [employer]? Este medicamento se administra mediante infusin en una vena. Lo administra un profesional de Technical sales engineer en un hospital o en un entorno clnico. Hable con su pediatra para informarse acerca del uso de este medicamento en nios. Puede requerir atencin especial. Sobredosis: Pngase en contacto inmediatamente con un centro toxicolgico o una sala de urgencia si usted cree que haya tomado demasiado medicamento. ATENCIN: [employer] Foods es solo para usted. No comparta este medicamento con nadie. Qu sucede si me olvido de una dosis? Es importante no olvidar ninguna dosis. Informe a su mdico o a su profesional de la salud si no puede asistir a Photographer. Qu puede interactuar con este medicamento? No se anticipan interacciones. Puede ser que esta lista no menciona todas las posibles interacciones. Informe a su profesional de KB Home	[place] de [employer] Corporation productos a base de hierbas, medicamentos de [place] o suplementos nutritivos que est tomando. Si usted fuma, consume bebidas alcohlicas o si utiliza drogas ilegales,  indqueselo tambin a su profesional de KB Home	[place]. Algunas sustancias pueden interactuar con su medicamento. A qu debo estar atento al usar Coca-Cola? Se supervisar su estado de salud atentamente mientras reciba este medicamento. Tendr que hacerse anlisis de sangre importantes y [employer] de [place] mientras est tomando este medicamento. Este medicamento podra aumentar el riesgo de moretones o sangrado. Consulte a su mdico o a su profesional de la salud si observa sangrados inusuales. Antes de realizarse una ciruga, hable con su proveedor de atencin mdica para asegurarse de que no hay ningn problema. Este frmaco puede aumentar el riesgo de que el sitio o la herida quirrgica no sanen correctamente. Deber dejar de usar este frmaco durante [address] antes de la [place]. Despus de la ciruga, espere al menos [address] antes de reiniciar el uso de este frmaco. Asegrese de que el sitio o la herida quirrgica haya sanado lo suficiente antes de reiniciar el uso del frmaco. Hable con su proveedor de atencin mdica si tiene alguna pregunta. No debe quedar embarazada mientras est usando este medicamento o por 6 meses despus de dejar de usarlo. Las mujeres deben informar a su mdico si estn buscando quedar embarazadas o si creen que podran estar embarazadas. Existe la posibilidad de efectos secundarios graves en un beb sin nacer. Para obtener ms informacin, hable con su profesional de la salud o su farmacutico. No debe Economist a un beb mientras est usando este medicamento y durante 6 meses despus  de la ltima dosis. Este medicamento ha causado insuficiencia ovrica en [employer]. Este medicamento puede interferir con la capacidad de tener hijos. Usted debe hablar con su mdico o su profesional de la salud si est preocupado por su fertilidad. Qu efectos secundarios puedo tener al [employer] este medicamento? Efectos secundarios que debe informar a su mdico o a Barrister's clerk de la  salud tan pronto como sea posible: Chief of Staff, tales como erupcin cutnea, comezn/picazn o urticaria, e hinchazn de la cara, los labios o Advertising account planner u opresin en el pecho escalofros tos con sangre fiebre alta convulsiones estreimiento grave signos y sntomas de sangrado, tales como heces con sangre o de color negro y aspecto alquitranado; orina de color rojo o marrn oscuro; escupir sangre o material marrn que tiene el aspecto de posos (residuos) de caf; Tree surgeon rojas en la piel; sangrado o moretones inusuales en los ojos, las encas o la nariz signos y sntomas de un cogulo sanguneo, tales como problemas respiratorios; Social research officer, government en el pecho; dolor de cabeza grave, repentino; dolor, hinchazn, calor en la pierna signos y sntomas de un accidente cerebrovascular, tales como cambios en la visin; confusin; dificultad para hablar o entender; dolores de cabeza intensos; entumecimiento o debilidad repentina de la cara, el brazo o la pierna; problemas al Writer; Chief of Staff; prdida de equilibrio o coordinacin dolor estomacal sudoracin hinchazn de piernas o tobillos vmito aumento de peso Efectos secundarios que generalmente no requieren atencin mdica (infrmelos a su mdico o a Barrister's clerk de la salud si persisten o si son molestos): dolor de espalda cambios en el sentido del gusto disminucin del apetito piel seca nuseas cansancio Puede ser que esta lista no menciona todos los posibles efectos secundarios. Comunquese a su mdico por asesoramiento mdico [employer] Inc. Usted puede informar los efectos secundarios a la FDA por telfono al 1-800-FDA-1088. Dnde debo guardar mi medicina? Este medicamento se administra en hospitales o clnicas, y no necesitar guardarlo en su domicilio. ATENCIN: Este folleto es un resumen. Puede ser que no cubra toda la posible informacin. Si usted tiene preguntas acerca de esta medicina, consulte con su mdico, su  farmacutico o su profesional de Technical sales engineer.  2023 Elsevier/Gold Standard (2020-11-10 00:00:00)  Irinotecan Injection Qu es este medicamento? El IRINOTECN es un agente quimioteraputico. Se utiliza en el tratamiento del cncer de colon y rectal. Este medicamento puede ser utilizado para otros usos; si tiene alguna pregunta consulte con su proveedor de atencin mdica o con su farmacutico. MARCAS COMUNES: Camptosar Qu le debo informar a mi profesional de la salud antes de tomar este medicamento? Necesitan saber si usted presenta alguno de los siguientes problemas o situaciones: deshidratacin diarrea infeccin (especialmente infecciones virales, como varicela, fuegos labiales o herpes) enfermedad heptica recuentos sanguneos bajos, como baja cantidad de glbulos blancos, plaquetas o glbulos rojos niveles bajos de calcio, magnesio o potasio en la sangre terapia de radiacin en curso o reciente una reaccin alrgica o inusual al irinotecn, a otros medicamentos, alimentos, colorantes o conservantes si est embarazada o buscando quedar embarazada si est amamantando a un beb Cmo debo utilizar este medicamento? Este medicamento se administra como infusin en una vena. Un profesional de la salud especialmente capacitado lo administra en un hospital o clnica. Hable con su pediatra para informarse acerca del uso de este medicamento en nios. Puede requerir atencin especial. Sobredosis: Pngase en contacto inmediatamente con un centro toxicolgico o una sala de urgencia si usted cree que haya tomado demasiado medicamento. ATENCIN: Luna Fuse  es solo para usted. No comparta este medicamento con nadie. Qu sucede si me olvido de una dosis? Es importante no olvidar ninguna dosis. Informe a su mdico o a su profesional de la salud si no puede asistir a Photographer. Qu puede interactuar con este medicamento? No use este medicamento con ninguno de los siguientes  frmacos: cobicistat itraconazol Este medicamento podra interactuar con los siguientes frmacos: medicamentos antivirales para el VIH o SIDA ciertos antibiticos, tales como rifampicina o rifabutina ciertos medicamentos para infecciones micticas, tales como ketoconazol, posaconazol y voriconazol ciertos medicamentos para convulsiones, tales como [place], fenobarbital y fenitona claritromicina gemfibrozil nefazodona hierba de [place] Puede ser que esta lista no menciona todas las posibles interacciones. Informe a su profesional de KB Home	[place] de [employer] Corporation productos a base de hierbas, medicamentos de [place] o suplementos nutritivos que est tomando. Si usted fuma, consume bebidas alcohlicas o si utiliza drogas ilegales, indqueselo tambin a su profesional de KB Home	[place]. Algunas sustancias pueden interactuar con su medicamento. A qu debo estar atento al usar Coca-Cola? Se supervisar su estado de salud atentamente mientras reciba este medicamento. Tendr que hacerse anlisis de sangre importantes mientras est usando este medicamento. Este medicamento podra hacerle sentir un Nurse, mental health. Esto es normal, ya que la quimioterapia puede afectar tanto a las clulas sanas como a las clulas cancerosas. Si presenta algn efecto secundario, infrmelo. Contine con el tratamiento aun si se siente enfermo, a menos que su mdico le indique que lo suspenda. En algunos casos, podra recibir [employer]ands para ayudarlo con los efectos secundarios. Siga todas las instrucciones para usarlos. Puede experimentar somnolencia o mareos. No conduzca, no utilice maquinaria ni haga nada que Associate Professor en estado de alerta hasta que sepa cmo le afecta este medicamento. No se siente ni se ponga de pie con rapidez, especialmente si es un paciente de edad avanzada. Esto reduce el riesgo de mareos o [employer]. Consulte a su profesional de la salud si tiene fiebre, escalofros, dolor  de garganta o cualquier otro sntoma de resfro o gripe. No se trate usted mismo. Este medicamento reduce la capacidad del cuerpo para combatir infecciones. Trate de no acercarse a personas que estn enfermas. Evite usar productos que contienen aspirina, acetaminofeno, ibuprofeno, naproxeno o ketoprofeno, a menos que as lo indique su mdico. Estos productos pueden ocultar la fiebre. Este medicamento podra aumentar el riesgo de moretones o sangrado. Consulte a su mdico o a su profesional de la salud si observa sangrados inusuales. Proceda con cuidado al cepillar sus dientes, usar hilo dental o Risk manager palillos para los dientes, ya que podra contraer una infeccin o Therapist, art con mayor facilidad. Si se somete a algn tratamiento dental, informe a su dentista que est [employer]. No debe quedar embarazada mientras est usando este medicamento o por 6 meses despus de dejar de usarlo. Las mujeres deben informar a su profesional de la salud si estn buscando quedar embarazadas o si creen que podran estar embarazadas. Los hombres no deben Social worker a Social research officer, government estn recibiendo Coca-Cola y [place] 3 meses despus de dejar de usarlo. Existe la posibilidad de que ocurran efectos secundarios graves en un beb sin nacer. Para obtener ms informacin, hable con su profesional de KB Home	[place]. No debe amamantar a un beb mientras est tomando Coca-Cola o por 7 das despus de dejar de usarlo. Este medicamento ha causado insuficiencia ovrica en [employer]. Este medicamento puede causar dificultades para [place]. Hable con su profesional de  la salud si le preocupa su fertilidad. Este medicamento ha causado recuentos de esperma reducidos en algunos hombres. Esto puede hacer ms difcil que un hombre embarace a Musician. Hable con su profesional de la salud si le preocupa su fertilidad. Qu efectos secundarios puedo tener al [employer] este medicamento? Efectos secundarios  que debe informar a su mdico o a Barrister's clerk de la salud tan pronto como sea posible: Chief of Staff, tales como erupcin cutnea, comezn/picazn o urticaria, e hinchazn de la cara, los labios o la Holiday representative en el pecho diarrea enrojecimiento, goteo nasal, sudoracin durante la infusin recuentos sanguneos bajos: este medicamento podra reducir la cantidad de glbulos blancos, glbulos rojos y plaquetas. Su riesgo de infeccin y sangrado podra ser mayor. nuseas, vmito dolor, hinchazn, calor en la pierna signos de disminucin en la cantidad de plaquetas o sangrado: moretones, puntos rojos en la piel, heces de color negro y aspecto alquitranado, sangre en la orina signos de infeccin: fiebre o escalofros, tos, dolor de Investment banker, operational, Social research officer, government o dificultad para orinar signos de disminucin en la cantidad de glbulos rojos: debilidad o cansancio inusuales, desmayos, aturdimiento Efectos secundarios que generalmente no requieren Geophysical data processor (infrmelos a su mdico o a Barrister's clerk de la salud si persisten o si son molestos): estreimiento cada del cabello dolor de cabeza prdida del apetito llagas en la boca dolor estomacal Puede ser que esta lista no menciona todos los posibles efectos secundarios. Comunquese a su mdico por asesoramiento mdico [employer] Inc. Usted puede informar los efectos secundarios a la FDA por telfono al 1-800-FDA-1088. Dnde debo guardar mi medicina? Este medicamento se administra en hospitales o clnicas, y no necesitar guardarlo en su domicilio. ATENCIN: Este folleto es un resumen. Puede ser que no cubra toda la posible informacin. Si usted tiene preguntas acerca de esta medicina, consulte con su mdico, su farmacutico o su profesional de Technical sales engineer.  2023 Elsevier/Gold Standard (2019-08-14 00:00:00)  Leucovorin Injection Qu es este medicamento? La LEUCOVORINA se [place] para prevenir o tratar [employer] nocivos de ciertos  medicamentos. Este medicamento tambin sirve para tratar la anemia provocada por una nivel bajo de cido flico en el cuerpo. Tambin se puede administrar con 5-fluorouracilo (5-FU), para tratar el cncer de colon. Este medicamento puede ser utilizado para otros usos; si tiene alguna pregunta consulte con su proveedor de atencin mdica o con su farmacutico. Qu le debo informar a mi profesional de la salud antes de tomar este medicamento? Necesita saber si usted presenta alguno de los siguientes problemas o situaciones: anemia debido a bajos niveles de vitamina B-12 en la sangre una reaccin alrgica o inusual a la leucovorina, cido flico, a otros medicamentos, alimentos, colorantes o conservantes si est embarazada o buscando quedar embarazada si est amamantando a un beb Cmo debo utilizar este medicamento? Este medicamento se administra mediante inyeccin por va intramuscular o intravenosa. Lo administra un profesional de Technical sales engineer en un hospital o en un entorno clnico. Hable con su pediatra para informarse acerca del uso de este medicamento en nios. Puede requerir atencin especial. Sobredosis: Pngase en contacto inmediatamente con un centro toxicolgico o una sala de urgencia si usted cree que haya tomado demasiado medicamento. ATENCIN: [employer] Foods es solo para usted. No comparta este medicamento con nadie. Qu sucede si me olvido de una dosis? No se aplica en este caso. Qu puede interactuar con este medicamento? capecitabina fluorouracilo fenobarbital fenitona primidona trimetoprima- sulfametoxasol Puede ser que esta lista no menciona todas las posibles interacciones. Informe a  su profesional de la salud de todos los productos a base de hierbas, medicamentos de venta libre o suplementos nutritivos que est tomando. Si usted fuma, consume bebidas alcohlicas o si utiliza drogas ilegales, indqueselo tambin a su profesional de KB Home	[place]. Algunas sustancias pueden  interactuar con su medicamento. A qu debo estar atento al usar Coca-Cola? Se supervisar su estado de salud atentamente mientras reciba este medicamento. Este medicamento puede aumentar los efectos secundarios de 5-fluorouracilo, 5-FU. Si tiene diarrea o [employer] boca que no mejoran o que [place], consulte a su mdico o a su profesional de KB Home	[place]. Qu efectos secundarios puedo tener al [employer] Corporation este medicamento? Efectos secundarios que debe informar a su mdico o a Barrister's clerk de la salud tan pronto como sea posible: Chief of Staff como erupcin cutnea, picazn o urticarias, hinchazn de la cara, labios o lengua problemas respiratorios fiebre, infeccin llagas en la boca sangrado, magulladuras inusuales cansancio o debilidad inusual Efectos secundarios que, por lo general, no requieren atencin mdica (debe informarlos a su mdico o a su profesional de la salud si persisten o si son molestos): estreimiento o diarrea prdida del apetito nuseas, vmito Puede ser que esta lista no menciona todos los posibles efectos secundarios. Comunquese a su mdico por asesoramiento mdico [employer] Inc. Usted puede informar los efectos secundarios a la FDA por telfono al 1-800-FDA-1088. Dnde debo guardar mi medicina? Este medicamento se administra en hospitales o clnicas y no necesitar guardarlo en su domicilio. ATENCIN: Este folleto es un resumen. Puede ser que no cubra toda la posible informacin. Si usted tiene preguntas acerca de esta medicina, consulte con su mdico, su farmacutico o su profesional de Technical sales engineer.  2023 Elsevier/Gold Standard (2014-06-02 00:00:00)  Fluorouracil Injection Qu es este medicamento? El [place], 5-FU es un agente quimioteraputico. Desacelera el crecimiento de clulas cancerosas. Este medicamento se [place] para tratar muchos tipos de cncer, tales como cncer de mama, cncer de colon o rectal, cncer de pncreas y cncer  de estmago. Este medicamento puede ser utilizado para otros usos; si tiene alguna pregunta consulte con su proveedor de atencin mdica o con su farmacutico. MARCAS COMUNES: Adrucil Qu le debo informar a mi profesional de la salud antes de tomar este medicamento? Necesitan saber si usted presenta alguno de los siguientes problemas o situaciones: trastornos sanguneos deficiencia de dihidropirimidina deshidrogenasa (DPD) infeccin (especialmente infecciones virales, como varicela, fuegos labiales o herpes) enfermedad renal enfermedad heptica desnutricin, nutricin deficiente terapia de radiacin en curso o reciente una reaccin alrgica o inusual al fluorouracilo, a otros medicamentos quimioteraputicos, a otros medicamentos, alimentos, colorantes o conservantes si est embarazada o buscando quedar embarazada si est amamantando a un beb Cmo debo utilizar este medicamento? Este medicamento se administra como infusin o inyeccin en una vena. Un profesional de la salud especialmente capacitado lo administra en un hospital o clnica. Hable con su pediatra para informarse acerca del uso de este medicamento en nios. Puede requerir atencin especial. Sobredosis: Pngase en contacto inmediatamente con un centro toxicolgico o una sala de urgencia si usted cree que haya tomado demasiado medicamento. ATENCIN: [employer] es solo para usted. No comparta este medicamento con nadie. Qu sucede si me olvido de una dosis? Es importante no olvidar ninguna dosis. Informe a su mdico o a su profesional de la salud si no puede asistir a Photographer. Qu puede interactuar con este medicamento? No use este medicamento con ninguno de los siguientes frmacos: vacunas de virus vivos Este medicamento tambin puede Counselling psychologist  con los siguientes frmacos: medicamentos que tratan o previenen cogulos sanguneos, tales como warfarina, enoxaparina y dalteparina Puede ser que esta lista no menciona  todas las posibles interacciones. Informe a su profesional de KB Home	[place] de [employer] Corporation productos a base de hierbas, medicamentos de [place] o suplementos nutritivos que est tomando. Si usted fuma, consume bebidas alcohlicas o si utiliza drogas ilegales, indqueselo tambin a su profesional de KB Home	[place]. Algunas sustancias pueden interactuar con su medicamento. A qu debo estar atento al usar Coca-Cola? Visite a su mdico para que revise su evolucin. Este medicamento podra hacerle sentir un Nurse, mental health. Esto es normal, ya que la quimioterapia puede afectar tanto a las clulas sanas como a las clulas cancerosas. Si presenta algn efecto secundario, infrmelo. Contine con el tratamiento aun si se siente enfermo, a menos que su mdico le indique que lo suspenda. En algunos casos, podra recibir [employer] para ayudarlo con los efectos secundarios. Siga todas las instrucciones para usarlos. Llame a su mdico o a su profesional de la salud si tiene fiebre, escalofros o dolor de garganta, o cualquier otro sntoma de resfro o gripe. No se trate usted mismo. Este medicamento reduce la capacidad del cuerpo para combatir infecciones. Trate de no acercarse a personas que estn enfermas. Este medicamento podra aumentar el riesgo de moretones o sangrado. Consulte a su mdico o a su profesional de la salud si observa sangrados inusuales. Proceda con cuidado al cepillar sus dientes, usar hilo dental o Risk manager palillos para los dientes, ya que podra contraer una infeccin o Therapist, art con mayor facilidad. Si se somete a algn tratamiento dental, informe a su dentista que est [employer]. Evite usar productos que contienen aspirina, acetaminofeno, ibuprofeno, naproxeno o ketoprofeno, a menos que as lo indique su mdico. Estos productos pueden ocultar la fiebre. No debe quedar embarazada mientras est [employer]. Las mujeres deben informar a su mdico si estn  buscando quedar embarazadas o si creen que podran estar embarazadas. Existe la posibilidad de efectos secundarios graves en un beb sin nacer. Para obtener ms informacin, hable con su profesional de la salud o su farmacutico. No debe Economist a un beb mientras est usando este medicamento. Los hombres deben informar a su mdico si quieren tener hijos. Este medicamento puede reducir el recuento de esperma. No trate la diarrea con productos de [employer]. Contacte a su mdico si tiene diarrea por ms de 2 das, o si es grave y [place]. Este medicamento puede aumentar su sensibilidad al sol. Evite la [employer]. Si no la Product manager, utilice ropa protectora y crema de Photographer. No utilice lmparas solares, camas solares ni cabinas solares. Qu efectos secundarios puedo tener al [employer] este medicamento? Efectos secundarios que debe informar a su mdico o a Barrister's clerk de la salud tan pronto como sea posible: Chief of Staff, tales como erupcin cutnea, comezn/picazn o urticaria, e hinchazn de la cara, los labios o la lengua recuentos sanguneos bajos: este medicamento podra reducir la cantidad de glbulos blancos, glbulos rojos y plaquetas. Su riesgo de infeccin y sangrado podra ser mayor. signos de infeccin: fiebre o escalofros, tos, dolor de garganta, dolor o dificultad para orinar signos de disminucin en la cantidad de plaquetas o sangrado: moretones, puntos rojos en la piel, heces de color negro y aspecto alquitranado, sangre en la orina signos de disminucin en la cantidad de glbulos rojos: debilidad o cansancio inusuales, Youth worker, aturdimiento problemas para respirar cambios en la visin Tourist information centre manager  llagas en la boca nuseas y vmito dolor, hinchazn, enrojecimiento en el lugar de Air cabin crew, hormigueo o entumecimiento de las manos o los pies enrojecimiento, hinchazn o Pension scheme manager en las manos o los pies dolor estomacal sangrado inusual Efectos  secundarios que generalmente no requieren atencin mdica (infrmelos a su mdico o a Barrister's clerk de la salud si persisten o si son molestos): cambios en las uas de los pies o de las manos diarrea comezn/picazn o sequedad de la piel cada del cabello dolor de cabeza prdida del apetito sensibilidad de los ojos a la luz Tree surgeon estomacal ojos inusualmente llorosos Puede ser que esta lista no menciona todos los posibles efectos secundarios. Comunquese a su mdico por asesoramiento mdico [employer] Usted puede informar los efectos secundarios a la FDA por telfono al 1-800-FDA-1088. Dnde debo guardar mi medicina? Este medicamento se administra en hospitales o clnicas, y no necesitar guardarlo en su domicilio. ATENCIN: Este folleto es un resumen. Puede ser que no cubra toda la posible informacin. Si usted tiene preguntas acerca de esta medicina, consulte con su mdico, su farmacutico o su profesional de Technical sales engineer.  2023 Elsevier/Gold Standard (2019-08-14 00:00:00)  The chemotherapy medication bag should finish at 46 hours, 96 hours, or 7 days. For example, if your pump is scheduled for 46 hours and it was put on at 4:00 p.m., it should finish at 2:00 p.m. the day it is scheduled to come off regardless of your appointment time.     Estimated time to finish at 10:15 a.m. on Friday 05/26/2022.   If the display on your pump reads "Low Volume" and it is beeping, take the batteries out of the pump and come to the cancer center for it to be taken off.   If the pump alarms go off prior to the pump reading "Low Volume" then call [phone] and someone can assist you.  If the plunger comes out and the chemotherapy medication is leaking out, please use your home chemo spill kit to clean up the spill. Do NOT use paper towels or other household products.  If you have problems or questions regarding your pump, please call either [phone] (24 hours a day) or the  cancer center Monday-Friday 8:00 a.m.- 4:30 p.m. at the clinic number and we will assist you. If you are unable to get assistance, then go to the nearest Emergency Department and ask the staff to contact the IV team for assistance.

## 2022-05-24 NOTE — Progress Notes (Signed)
Patient seen by Dr. Sherrill today ? ?Vitals are within treatment parameters. ? ?Labs reviewed by Dr. Sherrill and are within treatment parameters. ? ?Per physician team, patient is ready for treatment and there are NO modifications to the treatment plan.  ?

## 2022-05-24 NOTE — Progress Notes (Signed)
Spanish Interpreter Raquel present during patient's infusion appointment.

## 2022-05-24 NOTE — Progress Notes (Signed)
Spanish Interpreter present during flush appointment.

## 2022-05-24 NOTE — Patient Instructions (Signed)

## 2022-05-24 NOTE — Progress Notes (Signed)
Visit today assisted by interpreter, Laural Golden with [facility].

## 2022-05-26 ENCOUNTER — Inpatient Hospital Stay: Payer: Commercial Managed Care - HMO | Attending: Oncology

## 2022-05-26 VITALS — BP 154/60 | HR 57 | Temp 97.9°F | Resp 20

## 2022-05-26 DIAGNOSIS — G620 Drug-induced polyneuropathy: Secondary | ICD-10-CM | POA: Diagnosis not present

## 2022-05-26 DIAGNOSIS — C189 Malignant neoplasm of colon, unspecified: Secondary | ICD-10-CM

## 2022-05-26 DIAGNOSIS — C184 Malignant neoplasm of transverse colon: Secondary | ICD-10-CM | POA: Diagnosis not present

## 2022-05-26 DIAGNOSIS — Z5111 Encounter for antineoplastic chemotherapy: Secondary | ICD-10-CM | POA: Insufficient documentation

## 2022-05-26 DIAGNOSIS — C787 Secondary malignant neoplasm of liver and intrahepatic bile duct: Secondary | ICD-10-CM | POA: Diagnosis not present

## 2022-05-26 DIAGNOSIS — C180 Malignant neoplasm of cecum: Secondary | ICD-10-CM | POA: Insufficient documentation

## 2022-05-26 DIAGNOSIS — C182 Malignant neoplasm of ascending colon: Secondary | ICD-10-CM | POA: Insufficient documentation

## 2022-05-26 MED ORDER — SODIUM CHLORIDE 0.9% FLUSH
10.0000 mL | INTRAVENOUS | Status: DC | PRN
  Administered 2022-05-26: 10 mL

## 2022-05-26 MED ORDER — HEPARIN SOD (PORK) LOCK FLUSH 100 UNIT/ML IV SOLN
500.0000 [IU] | Freq: Once | INTRAVENOUS | Status: AC | PRN
  Administered 2022-05-26: 500 [IU]

## 2022-05-26 NOTE — Progress Notes (Signed)
Patient was assisted today for his flush appointment by Interpreter Rebeca Allegra.

## 2022-05-26 NOTE — Patient Instructions (Signed)

## 2022-06-02 ENCOUNTER — Ambulatory Visit (HOSPITAL_BASED_OUTPATIENT_CLINIC_OR_DEPARTMENT_OTHER)
Admission: RE | Admit: 2022-06-02 | Discharge: 2022-06-02 | Disposition: A | Discharge status: Home / Self Care | Payer: Commercial Managed Care - HMO | Source: Ambulatory Visit | Attending: Oncology | Admitting: Oncology

## 2022-06-02 ENCOUNTER — Inpatient Hospital Stay: Payer: Commercial Managed Care - HMO

## 2022-06-02 DIAGNOSIS — C189 Malignant neoplasm of colon, unspecified: Secondary | ICD-10-CM | POA: Insufficient documentation

## 2022-06-02 MED ORDER — IOHEXOL 300 MG/ML  SOLN
100.0000 mL | Freq: Once | INTRAMUSCULAR | Status: AC | PRN
  Administered 2022-06-02: 80 mL via INTRAVENOUS

## 2022-06-04 ENCOUNTER — Other Ambulatory Visit: Payer: Self-pay | Admitting: Oncology

## 2022-06-06 ENCOUNTER — Encounter: Payer: Self-pay | Admitting: *Deleted

## 2022-06-06 ENCOUNTER — Inpatient Hospital Stay (HOSPITAL_BASED_OUTPATIENT_CLINIC_OR_DEPARTMENT_OTHER): Payer: Commercial Managed Care - HMO | Admitting: Oncology

## 2022-06-06 ENCOUNTER — Inpatient Hospital Stay: Payer: Commercial Managed Care - HMO

## 2022-06-06 VITALS — BP 156/67 | HR 72 | Temp 98.2°F | Resp 18 | Ht 68.0 in | Wt 174.0 lb

## 2022-06-06 VITALS — BP 150/74 | HR 60

## 2022-06-06 DIAGNOSIS — C189 Malignant neoplasm of colon, unspecified: Secondary | ICD-10-CM

## 2022-06-06 DIAGNOSIS — Z5111 Encounter for antineoplastic chemotherapy: Secondary | ICD-10-CM | POA: Diagnosis not present

## 2022-06-06 LAB — CBC WITH DIFFERENTIAL (CANCER CENTER ONLY)
Abs Immature Granulocytes: 0.01 10*3/uL (ref 0.00–0.07)
Basophils Absolute: 0 10*3/uL (ref 0.0–0.1)
Basophils Relative: 0 %
Eosinophils Absolute: 0.1 10*3/uL (ref 0.0–0.5)
Eosinophils Relative: 2 %
HCT: 37.1 % — ABNORMAL LOW (ref 39.0–52.0)
Hemoglobin: 12.5 g/dL — ABNORMAL LOW (ref 13.0–17.0)
Immature Granulocytes: 0 %
Lymphocytes Relative: 23 %
Lymphs Abs: 1.2 10*3/uL (ref 0.7–4.0)
MCH: 29.9 pg (ref 26.0–34.0)
MCHC: 33.7 g/dL (ref 30.0–36.0)
MCV: 88.8 fL (ref 80.0–100.0)
Monocytes Absolute: 0.6 10*3/uL (ref 0.1–1.0)
Monocytes Relative: 12 %
Neutro Abs: 3.4 10*3/uL (ref 1.7–7.7)
Neutrophils Relative %: 63 %
Platelet Count: 149 10*3/uL — ABNORMAL LOW (ref 150–400)
RBC: 4.18 MIL/uL — ABNORMAL LOW (ref 4.22–5.81)
RDW: 17.5 % — ABNORMAL HIGH (ref 11.5–15.5)
WBC Count: 5.4 10*3/uL (ref 4.0–10.5)
nRBC: 0 % (ref 0.0–0.2)

## 2022-06-06 LAB — CMP (CANCER CENTER ONLY)
ALT: 11 U/L (ref 0–44)
AST: 17 U/L (ref 15–41)
Albumin: 4 g/dL (ref 3.5–5.0)
Alkaline Phosphatase: 69 U/L (ref 38–126)
Anion gap: 7 (ref 5–15)
BUN: 19 mg/dL (ref 8–23)
CO2: 24 mmol/L (ref 22–32)
Calcium: 9.3 mg/dL (ref 8.9–10.3)
Chloride: 108 mmol/L (ref 98–111)
Creatinine: 0.95 mg/dL (ref 0.61–1.24)
GFR, Estimated: >60 mL/min (ref >60)
Glucose, Bld: 111 mg/dL — ABNORMAL HIGH (ref 70–99)
Potassium: 4.1 mmol/L (ref 3.5–5.1)
Sodium: 139 mmol/L (ref 135–145)
Total Bilirubin: 0.4 mg/dL (ref 0.3–1.2)
Total Protein: 6.9 g/dL (ref 6.5–8.1)

## 2022-06-06 LAB — TOTAL PROTEIN, URINE DIPSTICK: Protein, ur: NEGATIVE mg/dL

## 2022-06-06 LAB — CEA (ACCESS): CEA (CHCC): 11.03 ng/mL — ABNORMAL HIGH (ref 0.00–5.00)

## 2022-06-06 MED ORDER — SODIUM CHLORIDE 0.9 % IV SOLN: Freq: Once | INTRAVENOUS | Status: AC

## 2022-06-06 MED ORDER — SODIUM CHLORIDE 0.9 % IV SOLN
10.0000 mg | Freq: Once | INTRAVENOUS | Status: AC
  Administered 2022-06-06: 10 mg via INTRAVENOUS
  Filled 2022-06-06: qty 10

## 2022-06-06 MED ORDER — SODIUM CHLORIDE 0.9 % IV SOLN
2000.0000 mg/m2 | INTRAVENOUS | Status: DC
  Administered 2022-06-06: 3850 mg via INTRAVENOUS
  Filled 2022-06-06: qty 77

## 2022-06-06 MED ORDER — SODIUM CHLORIDE 0.9 % IV SOLN
5.0000 mg/kg | Freq: Once | INTRAVENOUS | Status: AC
  Administered 2022-06-06: 400 mg via INTRAVENOUS
  Filled 2022-06-06: qty 16

## 2022-06-06 MED ORDER — SODIUM CHLORIDE 0.9 % IV SOLN
180.0000 mg/m2 | Freq: Once | INTRAVENOUS | Status: AC
  Administered 2022-06-06: 340 mg via INTRAVENOUS
  Filled 2022-06-06: qty 15

## 2022-06-06 MED ORDER — ATROPINE SULFATE 1 MG/ML IV SOLN
0.5000 mg | Freq: Once | INTRAVENOUS | Status: AC | PRN
  Administered 2022-06-06: 0.5 mg via INTRAVENOUS
  Filled 2022-06-06: qty 1

## 2022-06-06 MED ORDER — FLUOROURACIL CHEMO INJECTION 2.5 GM/50ML
300.0000 mg/m2 | Freq: Once | INTRAVENOUS | Status: AC
  Administered 2022-06-06: 600 mg via INTRAVENOUS
  Filled 2022-06-06: qty 12

## 2022-06-06 MED ORDER — SODIUM CHLORIDE 0.9 % IV SOLN
300.0000 mg/m2 | Freq: Once | INTRAVENOUS | Status: AC
  Administered 2022-06-06: 580 mg via INTRAVENOUS
  Filled 2022-06-06: qty 29

## 2022-06-06 MED ORDER — PALONOSETRON HCL INJECTION 0.25 MG/5ML
0.2500 mg | Freq: Once | INTRAVENOUS | Status: AC
  Administered 2022-06-06: 0.25 mg via INTRAVENOUS
  Filled 2022-06-06: qty 5

## 2022-06-06 NOTE — Progress Notes (Signed)
[facility] OFFICE PROGRESS NOTE   Diagnosis: Colon cancer  INTERVAL HISTORY:   [name] completed another cycle of FOLFIRI/bevacizumab on 05/24/2022.  No nausea, mouth sores, or diarrhea.  He has mild numbness and cold sensitivity in the fingers.  Good appetite.  No other complaint.  Objective:  Vital signs in last 24 hours:  Blood pressure (!) 156/67, pulse 72, temperature 98.2 F (36.8 C), temperature source Oral, resp. rate 18, height 5' 8"$  (1.727 m), weight 174 lb (78.9 kg), SpO2 100 %.    HEENT: No thrush or ulcers Resp: Lungs clear bilaterally Cardio: Regular rate and rhythm GI: No hepatosplenomegaly, nontender Vascular: No leg edema  Skin: Mild erythema, skin thickening, and dryness of the fingers  Portacath/PICC-without erythema  Lab Results:  Lab Results  Component Value Date   WBC 5.4 06/06/2022   HGB 12.5 (L) 06/06/2022   HCT 37.1 (L) 06/06/2022   MCV 88.8 06/06/2022   PLT 149 (L) 06/06/2022   NEUTROABS 3.4 06/06/2022    CMP  Lab Results  Component Value Date   NA 139 06/06/2022   K 4.1 06/06/2022   CL 108 06/06/2022   CO2 24 06/06/2022   GLUCOSE 111 (H) 06/06/2022   BUN 19 06/06/2022   CREATININE 0.95 06/06/2022   CALCIUM 9.3 06/06/2022   PROT 6.9 06/06/2022   ALBUMIN 4.0 06/06/2022   AST 17 06/06/2022   ALT 11 06/06/2022   ALKPHOS 69 06/06/2022   BILITOT 0.4 06/06/2022   GFRNONAA >60 06/06/2022   GFRAA >60 12/01/2019    Lab Results  Component Value Date   CEA1 12.10 (H) 01/03/2021   CEA 11.03 (H) 06/06/2022    Lab Results  Component Value Date   INR 1.0 05/02/2019   LABPROT 13.2 05/02/2019    Imaging:  CT ABDOMEN PELVIS W CONTRAST  Result Date: 06/03/2022 CLINICAL DATA:  Restaging colon cancer * Tracking Code: BO * EXAM: CT ABDOMEN AND PELVIS WITH CONTRAST TECHNIQUE: Multidetector CT imaging of the abdomen and pelvis was performed using the standard protocol following bolus administration of intravenous  contrast. RADIATION DOSE REDUCTION: This exam was performed according to the departmental dose-optimization program which includes automated exposure control, adjustment of the mA and/or kV according to patient size and/or use of iterative reconstruction technique. CONTRAST:  75m OMNIPAQUE IOHEXOL 300 MG/ML  SOLN COMPARISON:  03/13/2022 and older FINDINGS: Lower chest: Mild linear opacity lung bases likely scar or atelectasis. No pleural effusion. Hepatobiliary: Diffuse fatty liver infiltration identified. Patent portal vein. Contracted gallbladder with dependent stones. There are several metastatic liver lesions identified. Once again 15-20 lesions are seen. Specific lesions will be followed for continuity. Several are showing more low-density in appearance in the prior. Lesion measured 2.9 x 2.5 cm in the dome between segment 7 and 8, today on series 2, image 6 measures 2.4 by 2.1 cm. A second lesion is measured. This abutting the left hepatic vein in segment 2 on series 2, image 10 today measures 2.4 by 2.4 cm and previously this would have measured 2.3 by 2.2 cm. Similar in size. Other lesions are similar to slightly smaller. Distribution is similar. Pancreas: Unremarkable. No pancreatic ductal dilatation or surrounding inflammatory changes. Spleen: Normal in size without focal abnormality. Adrenals/Urinary Tract: Adrenal glands are preserved. Once again large Bosniak 1 exophytic left-sided renal cyst is seen measuring proximally 9.2 cm in diameter. Other smaller foci also identified. No specific imaging follow-up. No collecting system dilatation of either kidney. The uterus has a  normal course and caliber down to the bladder. Overall preserved contours of the urinary bladder. Stomach/Bowel: Surgical changes noted from right hemicolectomy as well as separate resection and primary anastomosis in the sigmoid colon region. Scattered stool. The small and large bowel are nondilated. Stomach is nondilated. Once again  there is abnormal soft tissue in the right hemi abdominal mesentery. Previously this measured 7.0 x 4.7 cm and today when measured in a similar fashion this area would measure 5.3 by 3.7 cm on series 2, image 44. This does have some retraction of the adjacent mesentery with spiculations as well as involvement of some loops of small bowel best appreciated on coronal imaging such as series 5, image 57. No signs obstruction. Other peritoneal nodules are also identified in the pelvis such as series 2, image 64 measuring 2.4 by 1.4 cm posterior to the bladder which previously measured 3.2 x 1.7 cm. Vascular/Lymphatic: Scattered vascular calcifications. Normal caliber aorta and IVC. No separate lymph node enlargement identified in the abdomen and pelvis. Reproductive: Enlarged prostate with some mass effect along the bladder. Other: No ascites. Small fat containing inguinal hernias, right-greater-than-left. Musculoskeletal: Curvature and degenerative changes noted along the spine. Degenerative changes of the pelvis. IMPRESSION: Overall slight improvement compared to previous. Multiple liver metastases again seen. Similar distribution to previous. Lesions are similar to slightly smaller. Peritoneal and mesenteric nodules are smaller today. The dominant mesenteric mass in the right hemi abdominal mesentery does appear to involve the margins of small bowel loops but no signs of obstruction. No significant ascites.  No free air. Gallstones Electronically Signed   By: Jill Side M.D.   On: 06/03/2022 11:22    Medications: I have reviewed the patient's current medications.   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), transverse colon cancer stage IIB (T4aN0) status post extended right hemicolectomy 05/07/2019, MSS Tumor invasive superficial portion of the muscle ("internal third "), no tumor perforation,  no vascular invasion or perineural invasion, negative resection margins, 0/4 lymph nodes Elevated CEA 02/10/2019 CTs 03/05/2019, compared to outside CTs from 11/22/2018-worsened wall thickening in the cecum stable apple core lesion in the mid transverse colon, no evidence of recurrent tumor at the distal colonic anastomosis, no evidence of metastatic disease Colonoscopy 03/24/2019 20-2 malignant appearing masses noted in the colon, 1 filled the cecum measuring approximately 4 cm across and friable.  The other malignant appearing mass was circumferential creating a stricture with a 1.2 cm lumen located in the transverse segment approximately 4 to 5 cm in length.  There were 12-18 neoplastic appearing polyps in between the 2 obvious malignant masses ranging in size from 3 mm to 2 cm.  5 polyps distal to the transverse colon mass.  2 sigmoid colon polyps.  Cecum mass positive for adenocarcinoma.  Transverse colon mass positive for adenocarcinoma. Foundation 1 transverse colon-MSS, tumor mutation burden 7, KRAS wild-type, BRAF fusion 05/07/2019 laparoscopic extended right hemicolectomy, lysis of adhesions-5 cm invasive moderately differentiated adenocarcinoma involving cecum and proximal ascending colon, carcinoma invades into the pericolonic soft tissue; 7 cm invasive moderately differentiated carcinoma involving the transverse colon, carcinoma invades into the serosal surface (visceral peritoneum); all resection margins negative for carcinoma.  Lymphovascular invasion present.  In the proximal colon metastatic carcinoma involves 3 of 17 lymph nodes with 1 tumor deposit.  In the transverse colon 12 lymph nodes negative for metastatic carcinoma.  3 separate polyps: Tubular adenoma, inflammatory polyp and  hyperplastic polyp Cycle 1 capecitabine 06/02/2019 Cycle 2 capecitabine 06/23/2019 Cycle 3 capecitabine 07/14/2019 Cycle 4 capecitabine 08/04/2019 Cycle 5 capecitabine 08/25/2019 Cycle 6 capecitabine 09/15/2019 Cycle 7  capecitabine 10/06/2019 Cycle 8 capecitabine 10/27/2019 Upper endoscopy 07/23/2020-negative Colonoscopy 07/23/2020-normal-appearing anastomoses, polyp removed from the descending colon-tubular adenoma CTs 01/18/2021-right abdominal mesenteric mass, mass in the low anatomic pelvis consistent with metastases Cycle 1 FOLFOX 02/21/2021 Cycle 2 FOLFOX 03/07/2021, dose reductions made due to mild neutropenia Cycle 3 FOLFOX 03/21/2021 Cycle 4 FOLFOX 04/04/2021 Cycle 5 FOLFOX 04/19/2021 Cycle 6 FOLFOX 05/02/2021 CTs 05/12/2021-decrease size of nodular right mesenteric mass, complete resolution of soft tissue enhancing nodule in the right inguinal canal, resolution of nodular soft tissue mass posterior to the bladder, enlargement of a left upper lobe nodule and new 4 mm left upper lobe nodule-indeterminate Cycle 7 FOLFOX 05/16/2021 Cycle 8 FOLFOX 05/30/2021, oxaliplatin and 5-FU bolus held due to neuropathy and thrombocytopenia Cycle 9 FOLFOX 06/13/2021, oxaliplatin dose reduced Cycle 10 FOLFOX 06/27/2021 CT 07/07/2021-decrease in right mesenteric mass, decreased size of lobulated left upper lobe nodule, resolution of previous "new "left upper lobe nodule, new area of wall thickening at the distal transverse colon Treatment changed to Xeloda 2 weeks on/1 week off beginning 07/18/2021 Cycle 2 Xeloda beginning 08/09/2021 Cycle 3 Xeloda beginning 08/30/2021 Cycle 4 Xeloda beginning 09/21/2021 Cycle 5 Xeloda beginning 10/12/2021 Cycle 6 Xeloda beginning 11/02/2021 CTs 11/14/2021-right lower quadrant spiculated small bowel mesenteric mass measured 2.8 x 3.7 cm, previously 1.0 x 2.3 cm; adjacent mesenteric haziness and nodularity measuring up to 8 mm superiorly.  New 4 mm nodular lesion posterior segment right upper lobe along the major fissure. Treatment break beginning 11/17/2021 CTs 03/21/2022-multiple new liver metastases, enlarging implant in the right ileocolic mesentery, enlarging peritoneal implants in the low pelvis Cycle  1 FOLFIRI/Avastin 03/29/2022 Cycle 2 FOLFIRI/Avastin 04/11/2022 Cycle 3 FOLFIRI/Avastin 04/26/2022 Cycle 4 FOLFIRI/Avastin 05/10/2022 Cycle 5 FOLFIRI/Avastin 05/24/2022 CT abdomen/pelvis 06/02/2022-liver metastases are stable and smaller, smaller peritoneal/mesenteric nodules 2.   Microcytic anemia secondary to #1.    Resolved 3.   Family history of pancreas and colon cancer, negative genetic testing per the [facility] panel, ATM variant of unknown significance 4.   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  He has tolerated the treatment well.  The restaging CTs are consistent with a response to therapy.  I reviewed the CT findings and images with him.  We measured multiple liver and peritoneal lesions.  They all measure smaller.  The plan is to proceed with 5 additional cycles of FOLFIRI/bevacizumab on a 2-week schedule.  He will undergo a restaging CT evaluation after 5 more cycles of chemotherapy.  He will complete another cycle of FOLFIRI/bevacizumab today.  He will return for an office visit and chemotherapy in 2 weeks.  Betsy Coder, MD  06/06/2022  9:13 AM

## 2022-06-06 NOTE — Patient Instructions (Signed)

## 2022-06-06 NOTE — Patient Instructions (Addendum)
Instrucciones al darle de alta: Discharge Instructions Gracias por elegir al [facility] de Cncer de [place] para brindarle atencin mdica de oncologa y Music therapist.   Si usted tiene una cita de laboratorio con [place], por favor vaya directamente [place] y regstrese en el rea de Control and instrumentation engineer.   Use ropa cmoda y [place] para tener fcil acceso a las vas del Portacath (acceso venoso de Engineer, site duracin) o la lnea PICC (catter central colocado por va perifrica).   Nos esforzamos por ofrecerle tiempo de calidad con su proveedor. Es posible que tenga que volver a programar su cita si llega tarde (15 minutos o ms).  El llegar tarde le afecta a usted y a otros pacientes cuyas citas son posteriores a Merchandiser, retail.  Adems, si usted falta a tres o ms citas sin avisar a la oficina, puede ser retirado(a) de la clnica a discrecin del proveedor.      Para las solicitudes de renovacin de recetas, pida a su farmacia que se ponga en contacto con nuestra oficina y deje que transcurran 34 horas para que se complete el proceso de las renovaciones.    Hoy usted recibi los siguientes agentes de quimioterapia e/o inmunoterapia Bevacizumab-awwb (MVASI), Irinotecan (CAMPTOSAR), Leucovorin & Flourouracil (ADRUCIL).      Para ayudar a prevenir las nuseas y los vmitos despus de su tratamiento, le recomendamos que tome su medicamento para las nuseas segn las indicaciones.  LOS SNTOMAS QUE DEBEN COMUNICARSE INMEDIATAMENTE SE INDICAN A CONTINUACIN: *FIEBRE SUPERIOR A 100.4 F (38 C) O MS *ESCALOFROS O SUDORACIN *NUSEAS Y VMITOS QUE NO SE CONTROLAN CON EL MEDICAMENTO PARA LAS NUSEAS *DIFICULTAD INUSUAL PARA RESPIRAR  *MORETONES O HEMORRAGIAS NO HABITUALES *PROBLEMAS URINARIOS (dolor o ardor al Garment/textile technologist o frecuencia para Garment/textile technologist) *PROBLEMAS INTESTINALES (diarrea inusual, estreimiento, dolor cerca del ano) SENSIBILIDAD EN LA BOCA Y EN LA GARGANTA CON O SIN LA PRESENCIA DE LCERAS  (dolor de garganta, llagas en la boca o dolor de muelas/dientes) ERUPCIN, HINCHAZN O DOLORES INUSUALES FLUJO VAGINAL INUSUAL O PICAZN/RASQUIA    Los puntos marcados con un asterisco ( *) indican una posible emergencia y debe hacer un seguimiento tan pronto como le sea posible o vaya al Departamento de Emergencias si se le presenta algn problema.  Por favor, muestre la [place] DE ADVERTENCIA DE Windy Canny DE ADVERTENCIA DE Benay Spice al registrarse en [address] de Emergencias y a la enfermera de triaje.  Si tiene preguntas despus de su visita o necesita cancelar o volver a programar su cita, por favor pngase en contacto con [place]  Dept: [phone]  y [place] instrucciones. Las horas de oficina son de 8:00 a.m. a 4:30 p.m. de lunes a viernes. Por favor, tenga en cuenta que los mensajes de voz que se dejan despus de las 4:00 p.m. posiblemente no se devolvern hasta el siguiente da de [place].  Cerramos los fines de semana y [employer]. En todo momento tiene acceso a una enfermera para preguntas urgentes. Por favor, llame al nmero principal de la clnica Dept: [phone] y [place] instrucciones.   Para cualquier pregunta que no sea de carcter urgente, tambin puede ponerse en contacto con su proveedor [employer]. Ahora ofrecemos visitas electrnicas para cualquier persona mayor de 18 aos que solicite atencin mdica en lnea para los sntomas que no sean urgentes. Para ms detalles vaya a [email or web address].   Tambin puede bajar la aplicacin de MyChart! Vaya a la  tienda de aplicaciones, busque "MyChart", abra la aplicacin, seleccione [place], e ingrese con su nombre de usuario y la contrasea de Pharmacist, community.  Bevacizumab Injection Qu es este medicamento? El BEVACIZUMAB es un anticuerpo monoclonal. Se [place] para tratar muchos tipos de cncer. Este medicamento puede ser utilizado para  otros usos; si tiene alguna pregunta consulte con su proveedor de atencin mdica o con su farmacutico. MARCAS COMUNES: Alymsys, Avastin, MVASI, Jacob Moores le debo informar a mi profesional de la salud antes de tomar este medicamento? Necesitan saber si usted presenta alguno de los [employer] o situaciones: diabetes enfermedad cardiaca presin sangunea alta antecedentes de tos con sangre quimioterapia previa con antraciclinas (p. ej.: doxorubicina, daunorubicina, epirubicina) terapia de radiacin en curso o reciente ciruga reciente o planes para realizarse una ciruga accidente cerebrovascular una reaccin alrgica o inusual al bevacizumab, a las protenas de Production designer, theatre/television/film, a las protenas de ratn, a otros medicamentos, alimentos, Scientist, water quality o conservantes si est embarazada o buscando quedar embarazada si est amamantando a un beb Cmo debo [employer]? Este medicamento se administra mediante infusin en una vena. Lo administra un profesional de Technical sales engineer en un hospital o en un entorno clnico. Hable con su pediatra para informarse acerca del uso de este medicamento en nios. Puede requerir atencin especial. Sobredosis: Pngase en contacto inmediatamente con un centro toxicolgico o una sala de urgencia si usted cree que haya tomado demasiado medicamento. ATENCIN: [employer] Foods es solo para usted. No comparta este medicamento con nadie. Qu sucede si me olvido de una dosis? Es importante no olvidar ninguna dosis. Informe a su mdico o a su profesional de la salud si no puede asistir a Photographer. Qu puede interactuar con este medicamento? No se anticipan interacciones. Puede ser que esta lista no menciona todas las posibles interacciones. Informe a su profesional de KB Home	[place] de [employer] Corporation productos a base de hierbas, medicamentos de [place] o suplementos nutritivos que est tomando. Si usted fuma, consume bebidas alcohlicas o si utiliza drogas ilegales,  indqueselo tambin a su profesional de KB Home	[place]. Algunas sustancias pueden interactuar con su medicamento. A qu debo estar atento al usar Coca-Cola? Se supervisar su estado de salud atentamente mientras reciba este medicamento. Tendr que hacerse anlisis de sangre importantes y [employer] de [place] mientras est tomando este medicamento. Este medicamento podra aumentar el riesgo de moretones o sangrado. Consulte a su mdico o a su profesional de la salud si observa sangrados inusuales. Antes de realizarse una ciruga, hable con su proveedor de atencin mdica para asegurarse de que no hay ningn problema. Este frmaco puede aumentar el riesgo de que el sitio o la herida quirrgica no sanen correctamente. Deber dejar de usar este frmaco durante [address] antes de la [place]. Despus de la ciruga, espere al menos [address] antes de reiniciar el uso de este frmaco. Asegrese de que el sitio o la herida quirrgica haya sanado lo suficiente antes de reiniciar el uso del frmaco. Hable con su proveedor de atencin mdica si tiene alguna pregunta. No debe quedar embarazada mientras est usando este medicamento o por 6 meses despus de dejar de usarlo. Las mujeres deben informar a su mdico si estn buscando quedar embarazadas o si creen que podran estar embarazadas. Existe la posibilidad de efectos secundarios graves en un beb sin nacer. Para obtener ms informacin, hable con su profesional de la salud o su farmacutico. No debe Economist a un beb mientras est usando este medicamento y durante 6 meses despus  de la ltima dosis. Este medicamento ha causado insuficiencia ovrica en [employer]. Este medicamento puede interferir con la capacidad de tener hijos. Usted debe hablar con su mdico o su profesional de la salud si est preocupado por su fertilidad. Qu efectos secundarios puedo tener al [employer] Corporation este medicamento? Efectos secundarios que debe informar a su mdico o a Barrister's clerk de la  salud tan pronto como sea posible: Chief of Staff, tales como erupcin cutnea, comezn/picazn o urticaria, e hinchazn de la cara, los labios o Advertising account planner u opresin en el pecho escalofros tos con sangre fiebre alta convulsiones estreimiento grave signos y sntomas de sangrado, tales como heces con sangre o de color negro y aspecto alquitranado; orina de color rojo o marrn oscuro; escupir sangre o material marrn que tiene el aspecto de posos (residuos) de caf; Tree surgeon rojas en la piel; sangrado o moretones inusuales en los ojos, las encas o la nariz signos y sntomas de un cogulo sanguneo, tales como problemas respiratorios; Social research officer, government en el pecho; dolor de cabeza grave, repentino; dolor, hinchazn, calor en la pierna signos y sntomas de un accidente cerebrovascular, tales como cambios en la visin; confusin; dificultad para hablar o entender; dolores de cabeza intensos; entumecimiento o debilidad repentina de la cara, el brazo o la pierna; problemas al Writer; Chief of Staff; prdida de equilibrio o coordinacin dolor estomacal sudoracin hinchazn de piernas o tobillos vmito aumento de peso Efectos secundarios que generalmente no requieren atencin mdica (infrmelos a su mdico o a Barrister's clerk de la salud si persisten o si son molestos): dolor de espalda cambios en el sentido del gusto disminucin del apetito piel seca nuseas cansancio Puede ser que esta lista no menciona todos los posibles efectos secundarios. Comunquese a su mdico por asesoramiento mdico [employer] Inc. Usted puede informar los efectos secundarios a la FDA por telfono al 1-800-FDA-1088. Dnde debo guardar mi medicina? Este medicamento se administra en hospitales o clnicas, y no necesitar guardarlo en su domicilio. ATENCIN: Este folleto es un resumen. Puede ser que no cubra toda la posible informacin. Si usted tiene preguntas acerca de esta medicina, consulte con su mdico, su  farmacutico o su profesional de Technical sales engineer.  2023 Elsevier/Gold Standard (2020-11-10 00:00:00)  Irinotecan Injection Qu es este medicamento? El IRINOTECN es un agente quimioteraputico. Se utiliza en el tratamiento del cncer de colon y rectal. Este medicamento puede ser utilizado para otros usos; si tiene alguna pregunta consulte con su proveedor de atencin mdica o con su farmacutico. MARCAS COMUNES: Camptosar Qu le debo informar a mi profesional de la salud antes de tomar este medicamento? Necesitan saber si usted presenta alguno de los siguientes problemas o situaciones: deshidratacin diarrea infeccin (especialmente infecciones virales, como varicela, fuegos labiales o herpes) enfermedad heptica recuentos sanguneos bajos, como baja cantidad de glbulos blancos, plaquetas o glbulos rojos niveles bajos de calcio, magnesio o potasio en la sangre terapia de radiacin en curso o reciente una reaccin alrgica o inusual al irinotecn, a otros medicamentos, alimentos, colorantes o conservantes si est embarazada o buscando quedar embarazada si est amamantando a un beb Cmo debo utilizar este medicamento? Este medicamento se administra como infusin en una vena. Un profesional de la salud especialmente capacitado lo administra en un hospital o clnica. Hable con su pediatra para informarse acerca del uso de este medicamento en nios. Puede requerir atencin especial. Sobredosis: Pngase en contacto inmediatamente con un centro toxicolgico o una sala de urgencia si usted cree que haya tomado demasiado medicamento. ATENCIN: Luna Fuse  es solo para usted. No comparta este medicamento con nadie. Qu sucede si me olvido de una dosis? Es importante no olvidar ninguna dosis. Informe a su mdico o a su profesional de la salud si no puede asistir a Photographer. Qu puede interactuar con este medicamento? No use este medicamento con ninguno de los siguientes  frmacos: cobicistat itraconazol Este medicamento podra interactuar con los siguientes frmacos: medicamentos antivirales para el VIH o SIDA ciertos antibiticos, tales como rifampicina o rifabutina ciertos medicamentos para infecciones micticas, tales como ketoconazol, posaconazol y voriconazol ciertos medicamentos para convulsiones, tales como [place], fenobarbital y fenitona claritromicina gemfibrozil nefazodona hierba de [place] Puede ser que esta lista no menciona todas las posibles interacciones. Informe a su profesional de KB Home	[place] de [employer] Corporation productos a base de hierbas, medicamentos de [place] o suplementos nutritivos que est tomando. Si usted fuma, consume bebidas alcohlicas o si utiliza drogas ilegales, indqueselo tambin a su profesional de KB Home	[place]. Algunas sustancias pueden interactuar con su medicamento. A qu debo estar atento al usar Coca-Cola? Se supervisar su estado de salud atentamente mientras reciba este medicamento. Tendr que hacerse anlisis de sangre importantes mientras est usando este medicamento. Este medicamento podra hacerle sentir un Nurse, mental health. Esto es normal, ya que la quimioterapia puede afectar tanto a las clulas sanas como a las clulas cancerosas. Si presenta algn efecto secundario, infrmelo. Contine con el tratamiento aun si se siente enfermo, a menos que su mdico le indique que lo suspenda. En algunos casos, podra recibir [employer]ands para ayudarlo con los efectos secundarios. Siga todas las instrucciones para usarlos. Puede experimentar somnolencia o mareos. No conduzca, no utilice maquinaria ni haga nada que Associate Professor en estado de alerta hasta que sepa cmo le afecta este medicamento. No se siente ni se ponga de pie con rapidez, especialmente si es un paciente de edad avanzada. Esto reduce el riesgo de mareos o [employer]. Consulte a su profesional de la salud si tiene fiebre, escalofros, dolor  de garganta o cualquier otro sntoma de resfro o gripe. No se trate usted mismo. Este medicamento reduce la capacidad del cuerpo para combatir infecciones. Trate de no acercarse a personas que estn enfermas. Evite usar productos que contienen aspirina, acetaminofeno, ibuprofeno, naproxeno o ketoprofeno, a menos que as lo indique su mdico. Estos productos pueden ocultar la fiebre. Este medicamento podra aumentar el riesgo de moretones o sangrado. Consulte a su mdico o a su profesional de la salud si observa sangrados inusuales. Proceda con cuidado al cepillar sus dientes, usar hilo dental o Risk manager palillos para los dientes, ya que podra contraer una infeccin o Therapist, art con mayor facilidad. Si se somete a algn tratamiento dental, informe a su dentista que est [employer]. No debe quedar embarazada mientras est usando este medicamento o por 6 meses despus de dejar de usarlo. Las mujeres deben informar a su profesional de la salud si estn buscando quedar embarazadas o si creen que podran estar embarazadas. Los hombres no deben Social worker a Social research officer, government estn recibiendo Coca-Cola y [place] 3 meses despus de dejar de usarlo. Existe la posibilidad de que ocurran efectos secundarios graves en un beb sin nacer. Para obtener ms informacin, hable con su profesional de KB Home	[place]. No debe amamantar a un beb mientras est tomando Coca-Cola o por 7 das despus de dejar de usarlo. Este medicamento ha causado insuficiencia ovrica en [employer]. Este medicamento puede causar dificultades para [place]. Hable con su profesional de  la salud si le preocupa su fertilidad. Este medicamento ha causado recuentos de esperma reducidos en algunos hombres. Esto puede hacer ms difcil que un hombre embarace a Musician. Hable con su profesional de la salud si le preocupa su fertilidad. Qu efectos secundarios puedo tener al [employer] Corporation este medicamento? Efectos secundarios  que debe informar a su mdico o a Barrister's clerk de la salud tan pronto como sea posible: Chief of Staff, tales como erupcin cutnea, comezn/picazn o urticaria, e hinchazn de la cara, los labios o la Holiday representative en el pecho diarrea enrojecimiento, goteo nasal, sudoracin durante la infusin recuentos sanguneos bajos: este medicamento podra reducir la cantidad de glbulos blancos, glbulos rojos y plaquetas. Su riesgo de infeccin y sangrado podra ser mayor. nuseas, vmito dolor, hinchazn, calor en la pierna signos de disminucin en la cantidad de plaquetas o sangrado: moretones, puntos rojos en la piel, heces de color negro y aspecto alquitranado, sangre en la orina signos de infeccin: fiebre o escalofros, tos, dolor de Investment banker, operational, Social research officer, government o dificultad para orinar signos de disminucin en la cantidad de glbulos rojos: debilidad o cansancio inusuales, desmayos, aturdimiento Efectos secundarios que generalmente no requieren Geophysical data processor (infrmelos a su mdico o a Barrister's clerk de la salud si persisten o si son molestos): estreimiento cada del cabello dolor de cabeza prdida del apetito llagas en la boca dolor estomacal Puede ser que esta lista no menciona todos los posibles efectos secundarios. Comunquese a su mdico por asesoramiento mdico [employer]ana Inc. Usted puede informar los efectos secundarios a la FDA por telfono al 1-800-FDA-1088. Dnde debo guardar mi medicina? Este medicamento se administra en hospitales o clnicas, y no necesitar guardarlo en su domicilio. ATENCIN: Este folleto es un resumen. Puede ser que no cubra toda la posible informacin. Si usted tiene preguntas acerca de esta medicina, consulte con su mdico, su farmacutico o su profesional de Technical sales engineer.  2023 Elsevier/Gold Standard (2019-08-14 00:00:00)  Leucovorin Injection Qu es este medicamento? La LEUCOVORINA se [place] para prevenir o tratar [employer] nocivos de ciertos  medicamentos. Este medicamento tambin sirve para tratar la anemia provocada por una nivel bajo de cido flico en el cuerpo. Tambin se puede administrar con 5-fluorouracilo (5-FU), para tratar el cncer de colon. Este medicamento puede ser utilizado para otros usos; si tiene alguna pregunta consulte con su proveedor de atencin mdica o con su farmacutico. Qu le debo informar a mi profesional de la salud antes de tomar este medicamento? Necesita saber si usted presenta alguno de los siguientes problemas o situaciones: anemia debido a bajos niveles de vitamina B-12 en la sangre una reaccin alrgica o inusual a la leucovorina, cido flico, a otros medicamentos, alimentos, colorantes o conservantes si est embarazada o buscando quedar embarazada si est amamantando a un beb Cmo debo utilizar este medicamento? Este medicamento se administra mediante inyeccin por va intramuscular o intravenosa. Lo administra un profesional de Technical sales engineer en un hospital o en un entorno clnico. Hable con su pediatra para informarse acerca del uso de este medicamento en nios. Puede requerir atencin especial. Sobredosis: Pngase en contacto inmediatamente con un centro toxicolgico o una sala de urgencia si usted cree que haya tomado demasiado medicamento. ATENCIN: [employer] Foods es solo para usted. No comparta este medicamento con nadie. Qu sucede si me olvido de una dosis? No se aplica en este caso. Qu puede interactuar con este medicamento? capecitabina fluorouracilo fenobarbital fenitona primidona trimetoprima- sulfametoxasol Puede ser que esta lista no menciona todas las posibles interacciones. Informe a  su profesional de la salud de todos los productos a base de hierbas, medicamentos de venta libre o suplementos nutritivos que est tomando. Si usted fuma, consume bebidas alcohlicas o si utiliza drogas ilegales, indqueselo tambin a su profesional de KB Home	[place]. Algunas sustancias pueden  interactuar con su medicamento. A qu debo estar atento al usar Coca-Cola? Se supervisar su estado de salud atentamente mientras reciba este medicamento. Este medicamento puede aumentar los efectos secundarios de 5-fluorouracilo, 5-FU. Si tiene diarrea o [employer] boca que no mejoran o que [place], consulte a su mdico o a su profesional de KB Home	[place]. Qu efectos secundarios puedo tener al [employer]co Corporation este medicamento? Efectos secundarios que debe informar a su mdico o a Barrister's clerk de la salud tan pronto como sea posible: Chief of Staff como erupcin cutnea, picazn o urticarias, hinchazn de la cara, labios o lengua problemas respiratorios fiebre, infeccin llagas en la boca sangrado, magulladuras inusuales cansancio o debilidad inusual Efectos secundarios que, por lo general, no requieren atencin mdica (debe informarlos a su mdico o a su profesional de la salud si persisten o si son molestos): estreimiento o diarrea prdida del apetito nuseas, vmito Puede ser que esta lista no menciona todos los posibles efectos secundarios. Comunquese a su mdico por asesoramiento mdico [employer]ana Inc. Usted puede informar los efectos secundarios a la FDA por telfono al 1-800-FDA-1088. Dnde debo guardar mi medicina? Este medicamento se administra en hospitales o clnicas y no necesitar guardarlo en su domicilio. ATENCIN: Este folleto es un resumen. Puede ser que no cubra toda la posible informacin. Si usted tiene preguntas acerca de esta medicina, consulte con su mdico, su farmacutico o su profesional de Technical sales engineer.  2023 Elsevier/Gold Standard (2014-06-02 00:00:00)  Fluorouracil Injection Qu es este medicamento? El [place], 5-FU es un agente quimioteraputico. Desacelera el crecimiento de clulas cancerosas. Este medicamento se [place] para tratar muchos tipos de cncer, tales como cncer de mama, cncer de colon o rectal, cncer de pncreas y cncer  de estmago. Este medicamento puede ser utilizado para otros usos; si tiene alguna pregunta consulte con su proveedor de atencin mdica o con su farmacutico. MARCAS COMUNES: Adrucil Qu le debo informar a mi profesional de la salud antes de tomar este medicamento? Necesitan saber si usted presenta alguno de los siguientes problemas o situaciones: trastornos sanguneos deficiencia de dihidropirimidina deshidrogenasa (DPD) infeccin (especialmente infecciones virales, como varicela, fuegos labiales o herpes) enfermedad renal enfermedad heptica desnutricin, nutricin deficiente terapia de radiacin en curso o reciente una reaccin alrgica o inusual al fluorouracilo, a otros medicamentos quimioteraputicos, a otros medicamentos, alimentos, colorantes o conservantes si est embarazada o buscando quedar embarazada si est amamantando a un beb Cmo debo utilizar este medicamento? Este medicamento se administra como infusin o inyeccin en una vena. Un profesional de la salud especialmente capacitado lo administra en un hospital o clnica. Hable con su pediatra para informarse acerca del uso de este medicamento en nios. Puede requerir atencin especial. Sobredosis: Pngase en contacto inmediatamente con un centro toxicolgico o una sala de urgencia si usted cree que haya tomado demasiado medicamento. ATENCIN: [employer] Foods es solo para usted. No comparta este medicamento con nadie. Qu sucede si me olvido de una dosis? Es importante no olvidar ninguna dosis. Informe a su mdico o a su profesional de la salud si no puede asistir a Photographer. Qu puede interactuar con este medicamento? No use este medicamento con ninguno de los siguientes frmacos: vacunas de virus vivos Este medicamento tambin puede Counselling psychologist  con los siguientes frmacos: medicamentos que tratan o previenen cogulos sanguneos, tales como warfarina, enoxaparina y dalteparina Puede ser que esta lista no menciona  todas las posibles interacciones. Informe a su profesional de KB Home	[place] de [employer] Corporation productos a base de hierbas, medicamentos de [place] o suplementos nutritivos que est tomando. Si usted fuma, consume bebidas alcohlicas o si utiliza drogas ilegales, indqueselo tambin a su profesional de KB Home	[place]. Algunas sustancias pueden interactuar con su medicamento. A qu debo estar atento al usar Coca-Cola? Visite a su mdico para que revise su evolucin. Este medicamento podra hacerle sentir un Nurse, mental health. Esto es normal, ya que la quimioterapia puede afectar tanto a las clulas sanas como a las clulas cancerosas. Si presenta algn efecto secundario, infrmelo. Contine con el tratamiento aun si se siente enfermo, a menos que su mdico le indique que lo suspenda. En algunos casos, podra recibir [employer] para ayudarlo con los efectos secundarios. Siga todas las instrucciones para usarlos. Llame a su mdico o a su profesional de la salud si tiene fiebre, escalofros o dolor de garganta, o cualquier otro sntoma de resfro o gripe. No se trate usted mismo. Este medicamento reduce la capacidad del cuerpo para combatir infecciones. Trate de no acercarse a personas que estn enfermas. Este medicamento podra aumentar el riesgo de moretones o sangrado. Consulte a su mdico o a su profesional de la salud si observa sangrados inusuales. Proceda con cuidado al cepillar sus dientes, usar hilo dental o Risk manager palillos para los dientes, ya que podra contraer una infeccin o Therapist, art con mayor facilidad. Si se somete a algn tratamiento dental, informe a su dentista que est [employer]. Evite usar productos que contienen aspirina, acetaminofeno, ibuprofeno, naproxeno o ketoprofeno, a menos que as lo indique su mdico. Estos productos pueden ocultar la fiebre. No debe quedar embarazada mientras est [employer]. Las mujeres deben informar a su mdico si estn  buscando quedar embarazadas o si creen que podran estar embarazadas. Existe la posibilidad de efectos secundarios graves en un beb sin nacer. Para obtener ms informacin, hable con su profesional de la salud o su farmacutico. No debe Economist a un beb mientras est usando este medicamento. Los hombres deben informar a su mdico si quieren tener hijos. Este medicamento puede reducir el recuento de esperma. No trate la diarrea con productos de [employer]. Contacte a su mdico si tiene diarrea por ms de 2 das, o si es grave y [place]. Este medicamento puede aumentar su sensibilidad al sol. Evite la [employer]. Si no la Product manager, utilice ropa protectora y crema de Photographer. No utilice lmparas solares, camas solares ni cabinas solares. Qu efectos secundarios puedo tener al [employer] este medicamento? Efectos secundarios que debe informar a su mdico o a Barrister's clerk de la salud tan pronto como sea posible: Chief of Staff, tales como erupcin cutnea, comezn/picazn o urticaria, e hinchazn de la cara, los labios o la lengua recuentos sanguneos bajos: este medicamento podra reducir la cantidad de glbulos blancos, glbulos rojos y plaquetas. Su riesgo de infeccin y sangrado podra ser mayor. signos de infeccin: fiebre o escalofros, tos, dolor de garganta, dolor o dificultad para orinar signos de disminucin en la cantidad de plaquetas o sangrado: moretones, puntos rojos en la piel, heces de color negro y aspecto alquitranado, sangre en la orina signos de disminucin en la cantidad de glbulos rojos: debilidad o cansancio inusuales, Youth worker, aturdimiento problemas para respirar cambios en la visin Tourist information centre manager  llagas en la boca nuseas y vmito dolor, hinchazn, enrojecimiento en el lugar de Air cabin crew, hormigueo o entumecimiento de las manos o los pies enrojecimiento, hinchazn o Pension scheme manager en las manos o los pies dolor estomacal sangrado inusual Efectos  secundarios que generalmente no requieren atencin mdica (infrmelos a su mdico o a Barrister's clerk de la salud si persisten o si son molestos): cambios en las uas de los pies o de las manos diarrea comezn/picazn o sequedad de la piel cada del cabello dolor de cabeza prdida del apetito sensibilidad de los ojos a la luz Tree surgeon estomacal ojos inusualmente llorosos Puede ser que esta lista no menciona todos los posibles efectos secundarios. Comunquese a su mdico por asesoramiento mdico [employer] Usted puede informar los efectos secundarios a la FDA por telfono al 1-800-FDA-1088. Dnde debo guardar mi medicina? Este medicamento se administra en hospitales o clnicas, y no necesitar guardarlo en su domicilio. ATENCIN: Este folleto es un resumen. Puede ser que no cubra toda la posible informacin. Si usted tiene preguntas acerca de esta medicina, consulte con su mdico, su farmacutico o su profesional de Technical sales engineer.  2023 Elsevier/Gold Standard (2019-08-14 00:00:00)  The chemotherapy medication bag should finish at 46 hours, 96 hours, or 7 days. For example, if your pump is scheduled for 46 hours and it was put on at 4:00 p.m., it should finish at 2:00 p.m. the day it is scheduled to come off regardless of your appointment time.     Estimated time to finish at 10:45 a.m. on Thursday 06/08/22.   If the display on your pump reads "Low Volume" and it is beeping, take the batteries out of the pump and come to the cancer center for it to be taken off.   If the pump alarms go off prior to the pump reading "Low Volume" then call [phone] and someone can assist you.  If the plunger comes out and the chemotherapy medication is leaking out, please use your home chemo spill kit to clean up the spill. Do NOT use paper towels or other household products.  If you have problems or questions regarding your pump, please call either [phone] (24 hours a day) or the  cancer center Monday-Friday 8:00 a.m.- 4:30 p.m. at the clinic number and we will assist you. If you are unable to get assistance, then go to the nearest Emergency Department and ask the staff to contact the IV team for assistance.

## 2022-06-06 NOTE — Progress Notes (Signed)
Visit today assisted by interpreter, Alis with [facility]

## 2022-06-06 NOTE — Progress Notes (Signed)
Patient seen by Dr. Sherrill today ? ?Vitals are within treatment parameters. ? ?Labs reviewed by Dr. Sherrill and are within treatment parameters. ? ?Per physician team, patient is ready for treatment and there are NO modifications to the treatment plan.  ?

## 2022-06-07 ENCOUNTER — Other Ambulatory Visit: Payer: Self-pay

## 2022-06-08 ENCOUNTER — Inpatient Hospital Stay: Payer: Commercial Managed Care - HMO

## 2022-06-08 VITALS — BP 150/64 | HR 62 | Temp 97.8°F | Resp 20

## 2022-06-08 DIAGNOSIS — C189 Malignant neoplasm of colon, unspecified: Secondary | ICD-10-CM

## 2022-06-08 DIAGNOSIS — Z5111 Encounter for antineoplastic chemotherapy: Secondary | ICD-10-CM | POA: Diagnosis not present

## 2022-06-08 MED ORDER — HEPARIN SOD (PORK) LOCK FLUSH 100 UNIT/ML IV SOLN
500.0000 [IU] | Freq: Once | INTRAVENOUS | Status: AC | PRN
  Administered 2022-06-08: 500 [IU]

## 2022-06-08 MED ORDER — SODIUM CHLORIDE 0.9% FLUSH
10.0000 mL | INTRAVENOUS | Status: DC | PRN
  Administered 2022-06-08: 10 mL

## 2022-06-08 NOTE — Patient Instructions (Signed)

## 2022-06-18 ENCOUNTER — Other Ambulatory Visit: Payer: Self-pay | Admitting: Oncology

## 2022-06-20 ENCOUNTER — Inpatient Hospital Stay (HOSPITAL_BASED_OUTPATIENT_CLINIC_OR_DEPARTMENT_OTHER): Payer: Commercial Managed Care - HMO | Admitting: Nurse Practitioner

## 2022-06-20 ENCOUNTER — Inpatient Hospital Stay: Payer: Commercial Managed Care - HMO

## 2022-06-20 ENCOUNTER — Encounter: Payer: Self-pay | Admitting: Nurse Practitioner

## 2022-06-20 VITALS — BP 159/75 | HR 63 | Resp 18

## 2022-06-20 VITALS — BP 146/67 | HR 63 | Temp 98.2°F | Resp 18 | Ht 68.0 in | Wt 171.0 lb

## 2022-06-20 DIAGNOSIS — C189 Malignant neoplasm of colon, unspecified: Secondary | ICD-10-CM

## 2022-06-20 DIAGNOSIS — Z5111 Encounter for antineoplastic chemotherapy: Secondary | ICD-10-CM | POA: Diagnosis not present

## 2022-06-20 LAB — CMP (CANCER CENTER ONLY)
ALT: 13 U/L (ref 0–44)
AST: 19 U/L (ref 15–41)
Albumin: 4.1 g/dL (ref 3.5–5.0)
Alkaline Phosphatase: 66 U/L (ref 38–126)
Anion gap: 6 (ref 5–15)
BUN: 17 mg/dL (ref 8–23)
CO2: 26 mmol/L (ref 22–32)
Calcium: 9.4 mg/dL (ref 8.9–10.3)
Chloride: 107 mmol/L (ref 98–111)
Creatinine: 0.95 mg/dL (ref 0.61–1.24)
GFR, Estimated: >60 mL/min (ref >60)
Glucose, Bld: 117 mg/dL — ABNORMAL HIGH (ref 70–99)
Potassium: 3.9 mmol/L (ref 3.5–5.1)
Sodium: 139 mmol/L (ref 135–145)
Total Bilirubin: 0.4 mg/dL (ref 0.3–1.2)
Total Protein: 7 g/dL (ref 6.5–8.1)

## 2022-06-20 LAB — CBC WITH DIFFERENTIAL (CANCER CENTER ONLY)
Abs Immature Granulocytes: 0.01 10*3/uL (ref 0.00–0.07)
Basophils Absolute: 0 10*3/uL (ref 0.0–0.1)
Basophils Relative: 1 %
Eosinophils Absolute: 0.1 10*3/uL (ref 0.0–0.5)
Eosinophils Relative: 4 %
HCT: 37.8 % — ABNORMAL LOW (ref 39.0–52.0)
Hemoglobin: 12.7 g/dL — ABNORMAL LOW (ref 13.0–17.0)
Immature Granulocytes: 0 %
Lymphocytes Relative: 30 %
Lymphs Abs: 1.2 10*3/uL (ref 0.7–4.0)
MCH: 29.5 pg (ref 26.0–34.0)
MCHC: 33.6 g/dL (ref 30.0–36.0)
MCV: 87.9 fL (ref 80.0–100.0)
Monocytes Absolute: 0.7 10*3/uL (ref 0.1–1.0)
Monocytes Relative: 16 %
Neutro Abs: 2 10*3/uL (ref 1.7–7.7)
Neutrophils Relative %: 49 %
Platelet Count: 177 10*3/uL (ref 150–400)
RBC: 4.3 MIL/uL (ref 4.22–5.81)
RDW: 17.2 % — ABNORMAL HIGH (ref 11.5–15.5)
WBC Count: 4 10*3/uL (ref 4.0–10.5)
nRBC: 0 % (ref 0.0–0.2)

## 2022-06-20 LAB — TOTAL PROTEIN, URINE DIPSTICK: Protein, ur: NEGATIVE mg/dL

## 2022-06-20 MED ORDER — ATROPINE SULFATE 1 MG/ML IV SOLN
0.5000 mg | Freq: Once | INTRAVENOUS | Status: AC | PRN
  Administered 2022-06-20: 0.5 mg via INTRAVENOUS
  Filled 2022-06-20: qty 1

## 2022-06-20 MED ORDER — SODIUM CHLORIDE 0.9 % IV SOLN
300.0000 mg/m2 | Freq: Once | INTRAVENOUS | Status: AC
  Administered 2022-06-20: 580 mg via INTRAVENOUS
  Filled 2022-06-20: qty 29

## 2022-06-20 MED ORDER — PALONOSETRON HCL INJECTION 0.25 MG/5ML
0.2500 mg | Freq: Once | INTRAVENOUS | Status: AC
  Administered 2022-06-20: 0.25 mg via INTRAVENOUS
  Filled 2022-06-20: qty 5

## 2022-06-20 MED ORDER — FLUOROURACIL CHEMO INJECTION 2.5 GM/50ML
300.0000 mg/m2 | Freq: Once | INTRAVENOUS | Status: AC
  Administered 2022-06-20: 600 mg via INTRAVENOUS
  Filled 2022-06-20: qty 12

## 2022-06-20 MED ORDER — SODIUM CHLORIDE 0.9 % IV SOLN
180.0000 mg/m2 | Freq: Once | INTRAVENOUS | Status: AC
  Administered 2022-06-20: 340 mg via INTRAVENOUS
  Filled 2022-06-20: qty 15

## 2022-06-20 MED ORDER — SODIUM CHLORIDE 0.9 % IV SOLN
5.0000 mg/kg | Freq: Once | INTRAVENOUS | Status: AC
  Administered 2022-06-20: 400 mg via INTRAVENOUS
  Filled 2022-06-20: qty 16

## 2022-06-20 MED ORDER — SODIUM CHLORIDE 0.9 % IV SOLN: Freq: Once | INTRAVENOUS | Status: AC

## 2022-06-20 MED ORDER — SODIUM CHLORIDE 0.9 % IV SOLN
10.0000 mg | Freq: Once | INTRAVENOUS | Status: AC
  Administered 2022-06-20: 10 mg via INTRAVENOUS
  Filled 2022-06-20: qty 1

## 2022-06-20 MED ORDER — SODIUM CHLORIDE 0.9 % IV SOLN
2000.0000 mg/m2 | INTRAVENOUS | Status: DC
  Administered 2022-06-20: 3850 mg via INTRAVENOUS
  Filled 2022-06-20: qty 77

## 2022-06-20 NOTE — Patient Instructions (Signed)
Instrucciones al darle de alta: Discharge Instructions Gracias por elegir al [facility] de Cncer de [place] para brindarle atencin mdica de oncologa y Music therapist.   Si usted tiene una cita de laboratorio con [place], por favor vaya directamente [place] y regstrese en el rea de Control and instrumentation engineer.   Use ropa cmoda y [place] para tener fcil acceso a las vas del Portacath (acceso venoso de Engineer, site duracin) o la lnea PICC (catter central colocado por va perifrica).   Nos esforzamos por ofrecerle tiempo de calidad con su proveedor. Es posible que tenga que volver a programar su cita si llega tarde (15 minutos o ms).  El llegar tarde le afecta a usted y a otros pacientes cuyas citas son posteriores a Merchandiser, retail.  Adems, si usted falta a tres o ms citas sin avisar a la oficina, puede ser retirado(a) de la clnica a discrecin del proveedor.      Para las solicitudes de renovacin de recetas, pida a su farmacia que se ponga en contacto con nuestra oficina y deje que transcurran 22 horas para que se complete el proceso de las renovaciones.    Hoy usted recibi los siguientes agentes de quimioterapia e/o inmunoterapia Bevacizumab-awwb (MVASI), Irinotecan (CAMPTOSAR), Leucovorin & Flourouracil (ADRUCIL).      Para ayudar a prevenir las nuseas y los vmitos despus de su tratamiento, le recomendamos que tome su medicamento para las nuseas segn las indicaciones.  LOS SNTOMAS QUE DEBEN COMUNICARSE INMEDIATAMENTE SE INDICAN A CONTINUACIN: *FIEBRE SUPERIOR A 100.4 F (38 C) O MS *ESCALOFROS O SUDORACIN *NUSEAS Y VMITOS QUE NO SE CONTROLAN CON EL MEDICAMENTO PARA LAS NUSEAS *DIFICULTAD INUSUAL PARA RESPIRAR  *MORETONES O HEMORRAGIAS NO HABITUALES *PROBLEMAS URINARIOS (dolor o ardor al Garment/textile technologist o frecuencia para Garment/textile technologist) *PROBLEMAS INTESTINALES (diarrea inusual, estreimiento, dolor cerca del ano) SENSIBILIDAD EN LA BOCA Y EN LA GARGANTA CON O SIN LA PRESENCIA DE LCERAS  (dolor de garganta, llagas en la boca o dolor de muelas/dientes) ERUPCIN, HINCHAZN O DOLORES INUSUALES FLUJO VAGINAL INUSUAL O PICAZN/RASQUIA    Los puntos marcados con un asterisco ( *) indican una posible emergencia y debe hacer un seguimiento tan pronto como le sea posible o vaya al Departamento de Emergencias si se le presenta algn problema.  Por favor, muestre la [place] DE ADVERTENCIA DE Windy Canny DE ADVERTENCIA DE Benay Spice al registrarse en [address] de Emergencias y a la enfermera de triaje.  Si tiene preguntas despus de su visita o necesita cancelar o volver a programar su cita, por favor pngase en contacto con [place]  Dept: [phone]  y [place] instrucciones. Las horas de oficina son de 8:00 a.m. a 4:30 p.m. de lunes a viernes. Por favor, tenga en cuenta que los mensajes de voz que se dejan despus de las 4:00 p.m. posiblemente no se devolvern hasta el siguiente da de [place].  Cerramos los fines de semana y [employer]. En todo momento tiene acceso a una enfermera para preguntas urgentes. Por favor, llame al nmero principal de la clnica Dept: [phone] y [place] instrucciones.   Para cualquier pregunta que no sea de carcter urgente, tambin puede ponerse en contacto con su proveedor [employer]. Ahora ofrecemos visitas electrnicas para cualquier persona mayor de 18 aos que solicite atencin mdica en lnea para los sntomas que no sean urgentes. Para ms detalles vaya a [email or web address].   Tambin puede bajar la aplicacin de MyChart! Vaya a la  tienda de aplicaciones, busque "MyChart", abra la aplicacin, seleccione [place], e ingrese con su nombre de usuario y la contrasea de Pharmacist, community.  Bevacizumab Injection Qu es este medicamento? El BEVACIZUMAB trata algunos tipos de cncer. Acta bloqueando una protena que hace que las clulas cancerosas crezcan y se  multipliquen. Esto ayuda a Music therapist propagacin de las clulas cancerosas. Es un anticuerpo monoclonal. Cindra Presume medicamento puede ser utilizado para otros usos; si tiene alguna pregunta consulte con su proveedor de atencin mdica o con su farmacutico. MARCAS COMUNES: Alymsys, Avastin, MVASI, Jacob Moores le debo informar a mi profesional de la salud antes de tomar este medicamento? Necesitan saber si usted presenta alguno de los siguientes problemas o situaciones: Cogulos sanguneos Tos con sangre Ciruga programada o reciente Insuficiencia cardiaca Presin arterial alta Antecedentes de una conexin entre 2 o ms partes del cuerpo que por lo general no estn conectadas (fstula) Antecedentes de un desgarro en el estmago o los intestinos Protena en la orina Una reaccin alrgica o inusual al bevacizumab, a otros medicamentos, alimentos, colorantes o conservantes Si est embarazada o buscando quedar embarazada Si est amamantando a un beb Cmo debo utilizar este medicamento? Este medicamento se inyecta en una vena. Su equipo de atencin lo [employer] en un hospital o en un entorno clnico. Hable con su equipo de atencin sobre el uso de este medicamento en nios. Puede requerir atencin especial. Sobredosis: Pngase en contacto inmediatamente con un centro toxicolgico o una sala de urgencia si usted cree que haya tomado demasiado medicamento. ATENCIN: [employer] es solo para usted. No comparta este medicamento con nadie. Qu sucede si me olvido de una dosis? Cumpla con las citas para dosis de seguimiento. Es importante no olvidar ninguna dosis. Llame a su equipo de atencin si no puede asistir a una cita. Qu puede interactuar con este medicamento? No se anticipan interacciones. Puede ser que esta lista no menciona todas las posibles interacciones. Informe a su profesional de KB Home	[place] de [employer] Corporation productos a base de hierbas, medicamentos de [place] o  suplementos nutritivos que est tomando. Si usted fuma, consume bebidas alcohlicas o si utiliza drogas ilegales, indqueselo tambin a su profesional de KB Home	[place]. Algunas sustancias pueden interactuar con su medicamento. A qu debo estar atento al usar Coca-Cola? Se supervisar su estado de salud atentamente mientras reciba este medicamento. Usted podra necesitar realizarse [employer] de sangre mientras est usando [place]. Este medicamento podra hacerle sentir un Nurse, mental health. Esto no es inusual, ya que la quimioterapia puede afectar tanto a las clulas sanas como a las clulas cancerosas. Si presenta algn efecto secundario, infrmelo. Contine con el tratamiento incluso si se siente enfermo, a menos que su equipo de [employer] lo suspenda. Este medicamento podra aumentar el riesgo de moretones o sangrado. Llame a su equipo de atencin si observa sangrados inusuales. Antes de realizarse una ciruga, hable con su equipo de atencin para asegurarse de que no hay ningn problema. Este medicamento puede aumentar el riesgo de que el sitio o la herida quirrgica no sanen correctamente. Tendr que dejar de usar este medicamento durante [address] antes de la [place]. Despus de la [place], espere al menos [address] antes de reiniciar el uso de [place]. Asegrese de que el sitio o la herida quirrgica haya sanado lo suficiente antes de Retail buyer el uso del medicamento. Hable con su equipo de atencin si tiene alguna pregunta. Hable con su equipo de atencin si podra estar embarazada. [employer]  medicamento puede causar defectos congnitos graves si se [place] durante el embarazo y por 6 meses despus de la ltima dosis. Se recomienda utilizar un mtodo anticonceptivo mientras est usando este medicamento y por 6 meses despus de la ltima dosis. Su equipo de atencin mdica puede ayudarle a Pension scheme manager la opcin que mejor se adapte a sus necesidades. No debe amamantar a un beb mientras  [place] este medicamento y por 6 meses despus de la ltima dosis. Este medicamento puede causar infertilidad. Hable con su equipo de atencin si le preocupa su fertilidad. Qu efectos secundarios puedo tener al [employer] este medicamento? Efectos secundarios que debe informar a su equipo de atencin tan pronto como sea posible: Reacciones alrgicas: erupcin cutnea, comezn/picazn, urticaria, hinchazn de la cara, los labios, la lengua o la garganta Sangrado: heces con [place], o de color negro y Curator alquitranado, vomitar sangre o material marrn que tiene el aspecto de posos (residuos) de caf, [place] de color rojo o marrn oscuro, pequeas manchas rojas o moradas en la piel, sangrado o moretones inusuales Cogulo sanguneo: dolor, hinchazn, calor en una pierna, falta de aire, dolor en el pecho Ataque cardiaco: dolor u opresin en el pecho, los hombros, los brazos o la [employer], nuseas, falta de [place], piel fra o sudorosa, sensacin de [place] o aturdimiento Insuficiencia cardiaca: falta de aire, hinchazn de los tobillos, los pies o las manos, aumento de peso repentino, debilidad o fatiga inusuales Aumento de la presin arterial Infeccin: fiebre, escalofros, tos, dolor de garganta, heridas que no sanan, dolor o problemas para Garment/textile technologist, sensacin general de molestia o IT sales professional a la infusin: Tourist information centre manager, falta de aire o dificultad para respirar, sensacin de desmayo o aturdimiento [employer] riones: disminucin en la cantidad de orina, hinchazn de los tobillos, las manos o los pies Dolor de estmago intenso que no desaparece o Ambulance person Accidente cerebrovascular: entumecimiento o debilidad repentinos de la cara, un brazo o una pierna, dificultad para hablar, confusin, dificultad para caminar, prdida de equilibrio o coordinacin, mareos, dolor de cabeza intenso, cambio en la visin Dolor de cabeza repentino e intenso, confusin, cambio en la visin, convulsiones, que podran  ser signos de sndrome de encefalopata posterior reversible Efectos secundarios que generalmente no requieren atencin mdica (debe informarlos a su equipo de atencin si persisten o si son molestos): Dolor de Sports coach en el sentido del gusto Diarrea Piel seca Aumento de lgrimas Sangrado por la nariz Puede ser que esta lista no menciona todos los posibles efectos secundarios. Comunquese a su mdico por asesoramiento mdico [employer] Inc. Usted puede informar los efectos secundarios a la FDA por telfono al 1-800-FDA-1088. Dnde debo guardar mi medicina? Este medicamento se administra en hospitales o clnicas. No se guarda en su casa. ATENCIN: Este folleto es un resumen. Puede ser que no cubra toda la posible informacin. Si usted tiene preguntas acerca de esta medicina, consulte con su mdico, su farmacutico o su profesional de Technical sales engineer.  2023 Elsevier/Gold Standard (2022-03-01 00:00:00)  Irinotecan Injection Qu es este medicamento? El IRINOTECN trata algunos tipos de cncer. Acta desacelerando el crecimiento de clulas cancerosas. Este medicamento puede ser utilizado para otros usos; si tiene alguna pregunta consulte con su proveedor de atencin mdica o con su farmacutico. MARCAS COMUNES: Camptosar Qu le debo informar a mi profesional de la salud antes de tomar este medicamento? Necesitan saber si usted presenta alguno de los [employer] International o situaciones: Deshidratacin Diarrea Infeccin, especialmente una infeccin viral, tales como varicela, fuegos labiales,  herpes Enfermedad heptica Niveles bajos de clulas sanguneas (glbulos blancos, glbulos rojos y plaquetas) Niveles bajos de Brewing technologist, tales como calcio, magnesio o potasio en la sangre Radiacin en curso o reciente Una reaccin alrgica o inusual al irinotecn, a otros medicamentos, alimentos, colorantes o conservantes Si usted o su pareja est embarazada o intentando quedar  embarazada Si est amamantando a un beb Cmo debo Insurance account manager medicamento? Este medicamento se inyecta en una vena. Su equipo de atencin lo [employer] en un hospital o en un entorno clnico. Hable con su equipo de atencin sobre el uso de este medicamento en nios. Puede requerir atencin especial. Sobredosis: Pngase en contacto inmediatamente con un centro toxicolgico o una sala de urgencia si usted cree que haya tomado demasiado medicamento. ATENCIN: [employer] es solo para usted. No comparta este medicamento con nadie. Qu sucede si me olvido de una dosis? Cumpla con las citas para dosis de seguimiento. Es importante no olvidar ninguna dosis. Llame a su equipo de atencin si no puede asistir a una cita. Qu puede interactuar con este medicamento? No use este medicamento con ninguno de los siguientes productos: Cobicistat Itraconazol Este medicamento tambin podra interactuar con los siguientes productos: Ciertos antibiticos, tales como claritromicina, rifampicina, rifabutina Ciertos medicamentos antivirales para el VIH o SIDA Ciertos medicamentos para las infecciones micticas, tales como ketoconazol, posaconazol, voriconazol Ciertos medicamentos para convulsiones, tales como [place], fenobarbital, fenitona Gemfibrozil Nefazodona Hierba de [place] Puede ser que esta lista no menciona todas las posibles interacciones. Informe a su profesional de KB Home	[place] de [employer] Corporation productos a base de hierbas, medicamentos de [place] o suplementos nutritivos que est tomando. Si usted fuma, consume bebidas alcohlicas o si utiliza drogas ilegales, indqueselo tambin a su profesional de KB Home	[place]. Algunas sustancias pueden interactuar con su medicamento. A qu debo estar atento al usar Coca-Cola? Se supervisar su estado de salud atentamente mientras reciba este medicamento. Usted podra necesitar realizarse [employer] de sangre mientras est usando [place]. Este medicamento podra hacerle sentir un Nurse, mental health. Esto no es inusual, ya que la quimioterapia puede afectar tanto a las clulas sanas como a las clulas cancerosas. Si presenta algn efecto secundario, infrmelo. Contine con el tratamiento incluso si se siente enfermo, a menos que su equipo de [employer] lo suspenda. Este medicamento puede causar efectos secundarios graves. Para reducir Catering manager, su equipo de atencin puede darle otros medicamentos que deber usar antes de Health and safety inspector. Asegrese de seguir las instrucciones de su equipo de atencin. Este medicamento podra afectar su coordinacin, tiempo de reaccin o juicio. No conduzca ni opere maquinaria pesada hasta que sepa cmo le afecta este medicamento. Pngase de pie o levntese lentamente para reducir el riesgo de mareos o [place]. Beber alcohol con Coca-Cola puede aumentar el riesgo de estos efectos secundarios. Este medicamento puede aumentar su riesgo de contraer una infeccin. Llame para pedir consejo a su equipo de atencin si tiene fiebre, escalofros, dolor de garganta o cualquier otro sntoma de resfriado o gripe. No se trate usted mismo. Trate de no acercarse a personas que estn enfermas. Evite usar medicamentos que contengan aspirina, acetaminofeno, ibuprofeno, naproxeno o ketoprofeno, a menos que as lo indique su equipo de atencin. Estos medicamentos pueden ocultar la fiebre. Este medicamento podra aumentar el riesgo de moretones o sangrado. Llame a su equipo de atencin si observa sangrados inusuales. Proceda con cuidado al cepillar sus dientes, usar hilo dental o Risk manager palillos para los dientes, ya que podra contraer [place]  infeccin o sangrar con mayor facilidad. Si recibe algn tratamiento dental, informe a su dentista que est [employer]. Informe a su equipo de atencin si usted o su pareja est embarazada o si cree que alguna de las dos podra estar embarazada. Este  medicamento puede causar defectos congnitos graves si se [place] durante el embarazo y por 6 meses despus de la ltima dosis. Deber realizarse una prueba de embarazo y obtener resultado negativo antes de Medical laboratory scientific officer a Solicitor. Se recomienda utilizar un mtodo anticonceptivo mientras est usando este medicamento y por 6 meses despus de la ltima dosis. Su equipo de atencin mdica puede ayudarle a Pension scheme manager la opcin que mejor se adapte a sus necesidades. No embarace a Social research officer, government est usando este medicamento y durante 3 meses despus de la ltima dosis. Use un condn como anticonceptivo [employer]. No debe amamantar a un beb mientras [place] este medicamento y por 7 das despus de la ltima dosis. Este medicamento podra causar infertilidad. Hable con su equipo de atencin si le preocupa su fertilidad. Qu efectos secundarios puedo tener al [employer] este medicamento? Efectos secundarios que debe informar a su equipo de atencin tan pronto como sea posible: Reacciones alrgicas: erupcin cutnea, comezn/picazn, urticaria, hinchazn de la cara, los labios, la lengua o la garganta Tos seca, falta de aire o problemas para respirar Aumento de saliva o lgrimas, aumento de la sudoracin, calambres estomacales, diarrea, pupilas pequeas, debilidad o fatiga inusuales, frecuencia cardiaca lenta Infeccin: fiebre, escalofros, tos, dolor de garganta, heridas que no sanan, dolor o problemas para Garment/textile technologist, sensacin general de molestia o malestar [employer] riones: disminucin en la cantidad de orina, hinchazn de los tobillos, las manos o los pies Recuento bajo de glbulos rojos: debilidad o fatiga inusuales, Chief of Staff, Social research officer, government de cabeza, dificultad para respirar Diarrea grave o prolongada Sangrado o moretones inusuales Efectos secundarios que generalmente no requieren atencin mdica (debe informarlos a su equipo de atencin si persisten o si son molestos): Estreimiento Diarrea Cada  del cabello Prdida del apetito Nuseas Dolor estomacal Puede ser que esta lista no menciona todos los posibles efectos secundarios. Comunquese a su mdico por asesoramiento mdico [employer] Inc. Usted puede informar los efectos secundarios a la FDA por telfono al 1-800-FDA-1088. Dnde debo guardar mi medicina? Este medicamento se administra en hospitales o clnicas. No se guarda en su casa. ATENCIN: Este folleto es un resumen. Puede ser que no cubra toda la posible informacin. Si usted tiene preguntas acerca de esta medicina, consulte con su mdico, su farmacutico o su profesional de Technical sales engineer.  2023 Elsevier/Gold Standard (2022-03-01 00:00:00)  Leucovorin Injection Qu es este medicamento? La LEUCOVORINA previene los efectos secundarios de ciertos medicamentos, Interior and spatial designer. Acta [employer] niveles de folato. Esto ayuda a proteger las clulas sanas del cuerpo. Tambin podra usarse para tratar la anemia causada por niveles bajos de folato. Tambin se puede Risk manager con fluorouracilo, un tipo de quimioterapia, para Dance movement psychotherapist. Acta [employer] del fluorouracilo en el cuerpo. Este medicamento puede ser utilizado para otros usos; si tiene alguna pregunta consulte con su proveedor de atencin mdica o con su farmacutico. Qu le debo informar a mi profesional de la salud antes de tomar este medicamento? Necesitan saber si usted presenta alguno de los W[employer] International o situaciones: Anemia por niveles bajos de vitamina B12 en la sangre Una reaccin alrgica o inusual a la leucovorina, al cido flico, a otros medicamentos, alimentos, colorantes o conservantes Si est  embarazada o buscando quedar embarazada Si est amamantando a un beb Cmo debo utilizar este medicamento? Este medicamento se inyecta en una vena o un msculo. Su equipo de atencin lo [employer] en un hospital o en un entorno clnico. Hable con su equipo de  atencin sobre el uso de este medicamento en nios. Puede requerir atencin especial. Sobredosis: Pngase en contacto inmediatamente con un centro toxicolgico o una sala de urgencia si usted cree que haya tomado demasiado medicamento. ATENCIN: [employer] es solo para usted. No comparta este medicamento con nadie. Qu sucede si me olvido de una dosis? Cumpla con las citas para dosis de seguimiento. Es importante no olvidar ninguna dosis. Llame a su equipo de atencin si no puede asistir a una cita. Qu puede interactuar con este medicamento? Capecitabina Fluorouracilo Fenobarbital Fenitona Primidona Trimetoprima;sulfametoxasol Puede ser que esta lista no menciona todas las posibles interacciones. Informe a su profesional de KB Home	[place] de [employer] Corporation productos a base de hierbas, medicamentos de [place] o suplementos nutritivos que est tomando. Si usted fuma, consume bebidas alcohlicas o si utiliza drogas ilegales, indqueselo tambin a su profesional de KB Home	[place]. Algunas sustancias pueden interactuar con su medicamento. A qu debo estar atento al usar Coca-Cola? Se supervisar su estado de salud atentamente mientras reciba este medicamento. Este medicamento podra aumentar los efectos secundarios del 5-fluorouracilo. Informe a su equipo de atencin si tiene diarrea o llagas en la boca que no mejoran o empeoran. Qu efectos secundarios puedo tener al [employer] este medicamento? Efectos secundarios que debe informar a su equipo de atencin tan pronto como sea posible: Reacciones alrgicas: erupcin cutnea, comezn/picazn, urticaria, hinchazn de la cara, los labios, la lengua o la garganta Puede ser que esta lista no menciona todos los posibles efectos secundarios. Comunquese a su mdico por asesoramiento mdico [employer] Inc. Usted puede informar los efectos secundarios a la FDA por telfono al 1-800-FDA-1088. Dnde debo guardar mi medicina? Este medicamento  se administra en hospitales o clnicas. No se guarda en su casa. ATENCIN: Este folleto es un resumen. Puede ser que no cubra toda la posible informacin. Si usted tiene preguntas acerca de esta medicina, consulte con su mdico, su farmacutico o su profesional de Technical sales engineer.  2023 Elsevier/Gold Standard (2022-03-01 00:00:00)  Fluorouracil Injection Qu es este medicamento? El FLUOROURACILO trata algunos tipos de cncer. Acta desacelerando el crecimiento de clulas cancerosas. Este medicamento puede ser utilizado para otros usos; si tiene alguna pregunta consulte con su proveedor de atencin mdica o con su farmacutico. MARCAS COMUNES: Adrucil Qu le debo informar a mi profesional de la salud antes de tomar este medicamento? Necesitan saber si usted presenta alguno de los siguientes problemas o situaciones: Trastornos sanguneos Deficiencia de dihidropirimidina deshidrogenasa (DPD) Infecciones, tales como varicela, fuegos labiales, herpes Enfermedad renal Enfermedad heptica Desnutricin [place] de radiacin en curso o reciente [place] reaccin alrgica o inusual al fluorouracilo, a otros medicamentos, alimentos, colorantes o conservantes Si usted o su pareja est embarazada o intentando quedar embarazada Si est amamantando a un beb Cmo debo Insurance account manager medicamento? Este medicamento se inyecta en una vena. Su equipo de atencin lo [employer] en un hospital o en un entorno clnico. Hable con su equipo de atencin sobre el uso de este medicamento en nios. Puede requerir atencin especial. Sobredosis: Pngase en contacto inmediatamente con un centro toxicolgico o una sala de urgencia si usted cree que haya tomado demasiado medicamento. ATENCIN: [employer] es solo para usted. No comparta este medicamento con nadie. Sander Nephew  sucede si me olvido de una dosis? Cumpla con las citas para dosis de seguimiento. Es importante no olvidar ninguna dosis. Llame a su equipo de atencin si no  puede asistir a una cita. Qu puede interactuar con este medicamento? No use este medicamento con ninguno de los siguientes productos: Clinical biochemist de virus vivos Este medicamento tambin podra Counselling psychologist con los siguientes productos: Medicamentos que tratan o previenen cogulos sanguneos, tales como warfarina, enoxaparina, dalteparina Puede ser que esta lista no menciona todas las posibles interacciones. Informe a su profesional de KB Home	[place] de [employer] productos a base de hierbas, medicamentos de [place] o suplementos nutritivos que est tomando. Si usted fuma, consume bebidas alcohlicas o si utiliza drogas ilegales, indqueselo tambin a su profesional de KB Home	[place]. Algunas sustancias pueden interactuar con su medicamento. A qu debo estar atento al usar [employer]? Se supervisar su estado de salud atentamente mientras reciba este medicamento. Este medicamento podra hacerle sentir un Nurse, mental health. Esto no es inusual, ya que la quimioterapia puede afectar tanto a las clulas sanas como a las clulas cancerosas. Si presenta algn efecto secundario, infrmelo. Contine con el tratamiento incluso si se siente enfermo, a menos que su equipo de [employer] lo suspenda. En algunos casos, podra recibir [employer] para ayudarle con los efectos secundarios. Siga todas las instrucciones para usarlos. Este medicamento puede aumentar su riesgo de contraer una infeccin. Llame para pedir consejo a su equipo de atencin si tiene fiebre, escalofros, dolor de garganta o cualquier otro sntoma de resfriado o gripe. No se trate usted mismo. Trate de no acercarse a personas que estn enfermas. Este medicamento podra aumentar el riesgo de moretones o sangrado. Llame a su equipo de atencin si observa sangrados inusuales. Proceda con cuidado al cepillar sus dientes, usar hilo dental o Risk manager palillos para los dientes, ya que podra contraer una infeccin o Therapist, art con mayor  facilidad. Si recibe algn tratamiento dental, informe a su dentista que est [employer]. Evite usar medicamentos que contengan aspirina, acetaminofeno, ibuprofeno, naproxeno o ketoprofeno, a menos que as lo indique su equipo de atencin. Estos medicamentos pueden ocultar la fiebre. No trate la diarrea con productos de [employer]. Contacte a su equipo de atencin si tiene diarrea por ms de 2 das, o si es grave y [place]. Este medicamento puede aumentar su sensibilidad al sol. Evite la [employer]. Si no la Product manager, utilice ropa protectora y crema de Photographer. No utilice lmparas solares, camas solares ni cabinas solares. Informe a su equipo de atencin si usted o su pareja est buscando un embarazo o si cree que podra estar embarazada. Este medicamento puede causar defectos congnitos graves si se [place] durante el embarazo y por 3 meses despus de la ltima dosis. Se recomienda Risk manager un mtodo anticonceptivo confiable mientras est usando este medicamento y durante 3 meses despus de la ltima dosis. Hable con su equipo de atencin sobre mtodos anticonceptivos eficaces. No embarace a Social research officer, government est usando este medicamento y durante 3 meses despus de la ltima dosis. Use un condn durante las relaciones sexuales que mantenga en [place]. No debe amamantar a un beb mientras est [employer]. Este medicamento podra causar infertilidad. Hable con su equipo de atencin si le preocupa su fertilidad. Qu efectos secundarios puedo tener al [employer] este medicamento? Efectos secundarios que debe informar a su equipo de atencin tan pronto como sea posible: Reacciones alrgicas: erupcin cutnea, comezn/picazn, urticaria, hinchazn de la cara, los  labios, la lengua o la garganta Ataque cardiaco: dolor u opresin en el pecho, los hombros, los brazos o la [place], nuseas, falta de [place], piel fra o sudorosa, sensacin de [place] o  aturdimiento Insuficiencia cardiaca: falta de aire, hinchazn de los tobillos, los pies o las manos, aumento de peso repentino, debilidad o fatiga inusuales Cambios en el ritmo cardiaco: frecuencia cardiaca rpida o irregular, mareos, sensacin de desmayo o aturdimiento, dolor en el pecho, dificultad para respirar Niveles elevados de amonaco: debilidad o fatiga inusuales, confusin, prdida de apetito, nuseas, vmitos, convulsiones Infeccin: fiebre, escalofros, tos, dolor de garganta, heridas que no sanan, dolor o problemas para Garment/textile technologist, sensacin general de molestia o malestar Recuento bajo de glbulos rojos: debilidad o fatiga inusuales, mareo, dolor de cabeza, dificultad para Contractor, hormigueo o entumecimiento en las manos o los pies, debilidad muscular, cambios en la visin, confusin o dificultad para hablar, prdida del equilibrio o la coordinacin, dificultad para caminar, convulsiones Enrojecimiento, hinchazn y [employer] en la piel de las manos y los pies Diarrea grave o prolongada Sangrado o moretones inusuales Efectos secundarios que generalmente no requieren atencin mdica (debe informarlos a su equipo de atencin si persisten o si son molestos): Piel seca Dolor de cabeza Aumento de Museum/gallery curator, enrojecimiento o hinchazn con llagas dentro de la boca o la garganta Sensibilidad a la luz [place] Puede ser que esta lista no menciona todos los posibles efectos secundarios. Comunquese a su mdico por asesoramiento mdico [employer]. Usted puede informar los efectos secundarios a la FDA por telfono al 1-800-FDA-1088. Dnde debo guardar mi medicina? Este medicamento se administra en hospitales o clnicas. No se guarda en su casa. ATENCIN: Este folleto es un resumen. Puede ser que no cubra toda la posible informacin. Si usted tiene preguntas acerca de esta medicina, consulte con su mdico, su farmacutico o su profesional de Technical sales engineer.  2023  Elsevier/Gold Standard (2022-03-01 00:00:00)  The chemotherapy medication bag should finish at 46 hours, 96 hours, or 7 days. For example, if your pump is scheduled for 46 hours and it was put on at 4:00 p.m., it should finish at 2:00 p.m. the day it is scheduled to come off regardless of your appointment time.     Estimated time to finish at 11:15 a.m. on Thursday 06/22/2022.   If the display on your pump reads "Low Volume" and it is beeping, take the batteries out of the pump and come to the cancer center for it to be taken off.   If the pump alarms go off prior to the pump reading "Low Volume" then call [phone] and someone can assist you.  If the plunger comes out and the chemotherapy medication is leaking out, please use your home chemo spill kit to clean up the spill. Do NOT use paper towels or other household products.  If you have problems or questions regarding your pump, please call either [phone] (24 hours a day) or the cancer center Monday-Friday 8:00 a.m.- 4:30 p.m. at the clinic number and we will assist you. If you are unable to get assistance, then go to the nearest Emergency Department and ask the staff to contact the IV team for assistance.

## 2022-06-20 NOTE — Progress Notes (Signed)
[facility] OFFICE PROGRESS NOTE   Diagnosis: Colon cancer  INTERVAL HISTORY:   John Vance returns as scheduled.  He completed cycle 6 FOLFIRI/bevacizumab 06/06/2022.  He denies nausea/vomiting.  No mouth sores.  No diarrhea.  No bleeding except mild blood with nose blowing.  This has not occurred recently.  He denies abdominal pain.  He has a good appetite.  He reports he is scheduled for cataract surgery 06/30/2022.  Objective:  Vital signs in last 24 hours:  Blood pressure (!) 146/67, pulse 63, temperature 98.2 F (36.8 C), temperature source Oral, resp. rate 18, height '5\' 8"'$  (1.727 m), weight 171 lb (77.6 kg), SpO2 100 %.    HEENT: No thrush or ulcers. Resp: Lungs clear bilaterally. Cardio: Regular rate and rhythm. GI: Abdomen soft and nontender.  No hepatosplenomegaly. Vascular: No leg edema. Skin: Palms with mild hyperpigmentation.  No erythema or skin breakdown. Port-A-Cath without erythema.   Lab Results:  Lab Results  Component Value Date   WBC 4.0 06/20/2022   HGB 12.7 (L) 06/20/2022   HCT 37.8 (L) 06/20/2022   MCV 87.9 06/20/2022   PLT 177 06/20/2022   NEUTROABS 2.0 06/20/2022    Imaging:  No results found.  Medications: I have reviewed the patient's current medications.  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), transverse colon cancer stage IIB (T4aN0) status post extended right hemicolectomy 05/07/2019, MSS Tumor invasive superficial portion of the muscle ("internal third "), no tumor perforation, no vascular invasion or perineural invasion, negative resection margins, 0/4 lymph nodes Elevated CEA 02/10/2019 CTs 03/05/2019, compared to outside CTs from 11/22/2018-worsened wall thickening in the cecum stable apple core lesion in the mid transverse colon, no evidence of recurrent tumor at the distal colonic anastomosis, no  evidence of metastatic disease Colonoscopy 03/24/2019 20-2 malignant appearing masses noted in the colon, 1 filled the cecum measuring approximately 4 cm across and friable.  The other malignant appearing mass was circumferential creating a stricture with a 1.2 cm lumen located in the transverse segment approximately 4 to 5 cm in length.  There were 12-18 neoplastic appearing polyps in between the 2 obvious malignant masses ranging in size from 3 mm to 2 cm.  5 polyps distal to the transverse colon mass.  2 sigmoid colon polyps.  Cecum mass positive for adenocarcinoma.  Transverse colon mass positive for adenocarcinoma. Foundation 1 transverse colon-MSS, tumor mutation burden 7, KRAS wild-type, BRAF fusion 05/07/2019 laparoscopic extended right hemicolectomy, lysis of adhesions-5 cm invasive moderately differentiated adenocarcinoma involving cecum and proximal ascending colon, carcinoma invades into the pericolonic soft tissue; 7 cm invasive moderately differentiated carcinoma involving the transverse colon, carcinoma invades into the serosal surface (visceral peritoneum); all resection margins negative for carcinoma.  Lymphovascular invasion present.  In the proximal colon metastatic carcinoma involves 3 of 17 lymph nodes with 1 tumor deposit.  In the transverse colon 12 lymph nodes negative for metastatic carcinoma.  3 separate polyps: Tubular adenoma, inflammatory polyp and hyperplastic polyp Cycle 1 capecitabine 06/02/2019 Cycle 2 capecitabine 06/23/2019 Cycle 3 capecitabine 07/14/2019 Cycle 4 capecitabine 08/04/2019 Cycle 5 capecitabine 08/25/2019 Cycle 6 capecitabine 09/15/2019 Cycle 7 capecitabine 10/06/2019 Cycle 8 capecitabine 10/27/2019 Upper endoscopy 07/23/2020-negative Colonoscopy 07/23/2020-normal-appearing anastomoses, polyp removed from the descending colon-tubular adenoma CTs 01/18/2021-right abdominal mesenteric mass, mass in the low anatomic pelvis consistent with metastases Cycle 1 FOLFOX  02/21/2021 Cycle 2 FOLFOX 03/07/2021, dose reductions made due to mild  neutropenia Cycle 3 FOLFOX 03/21/2021 Cycle 4 FOLFOX 04/04/2021 Cycle 5 FOLFOX 04/19/2021 Cycle 6 FOLFOX 05/02/2021 CTs 05/12/2021-decrease size of nodular right mesenteric mass, complete resolution of soft tissue enhancing nodule in the right inguinal canal, resolution of nodular soft tissue mass posterior to the bladder, enlargement of a left upper lobe nodule and new 4 mm left upper lobe nodule-indeterminate Cycle 7 FOLFOX 05/16/2021 Cycle 8 FOLFOX 05/30/2021, oxaliplatin and 5-FU bolus held due to neuropathy and thrombocytopenia Cycle 9 FOLFOX 06/13/2021, oxaliplatin dose reduced Cycle 10 FOLFOX 06/27/2021 CT 07/07/2021-decrease in right mesenteric mass, decreased size of lobulated left upper lobe nodule, resolution of previous "new "left upper lobe nodule, new area of wall thickening at the distal transverse colon Treatment changed to Xeloda 2 weeks on/1 week off beginning 07/18/2021 Cycle 2 Xeloda beginning 08/09/2021 Cycle 3 Xeloda beginning 08/30/2021 Cycle 4 Xeloda beginning 09/21/2021 Cycle 5 Xeloda beginning 10/12/2021 Cycle 6 Xeloda beginning 11/02/2021 CTs 11/14/2021-right lower quadrant spiculated small bowel mesenteric mass measured 2.8 x 3.7 cm, previously 1.0 x 2.3 cm; adjacent mesenteric haziness and nodularity measuring up to 8 mm superiorly.  New 4 mm nodular lesion posterior segment right upper lobe along the major fissure. Treatment break beginning 11/17/2021 CTs 03/21/2022-multiple new liver metastases, enlarging implant in the right ileocolic mesentery, enlarging peritoneal implants in the low pelvis Cycle 1 FOLFIRI/Avastin 03/29/2022 Cycle 2 FOLFIRI/Avastin 04/11/2022 Cycle 3 FOLFIRI/Avastin 04/26/2022 Cycle 4 FOLFIRI/Avastin 05/10/2022 Cycle 5 FOLFIRI/Avastin 05/24/2022 CT abdomen/pelvis 06/02/2022-liver metastases are stable and smaller, smaller peritoneal/mesenteric nodules Cycle 6 FOLFIRI/Avastin 06/06/2022 Cycle 7  FOLFIRI/Avastin 06/20/2022 2.   Microcytic anemia secondary to #1.    Resolved 3.   Family history of pancreas and colon cancer, negative genetic testing per the [facility] panel, ATM variant of unknown significance 4.   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  He has completed 6 cycles of FOLFIRI/Avastin.  He continues to tolerate treatment well.  Plan to proceed with cycle 7 today as scheduled.  CBC and chemistry panel reviewed.  Labs adequate to proceed as above.  He is scheduled for cataract surgery 06/30/2022.  He will alert the ophthalmologist of the current treatment regimen of FOLFIRI/Avastin.  He will return for lab, follow-up, cycle 8 FOLFIRI/Avastin in 2 weeks.  He will contact the office in the interim with any problems.    Ned Card ANP/GNP-BC   06/20/2022  9:41 AM

## 2022-06-20 NOTE — Progress Notes (Signed)
Mariel interpreter at chairside with patient.

## 2022-06-20 NOTE — Progress Notes (Signed)
Patient seen by Lisa Thomas NP today  Vitals are within treatment parameters.  Labs reviewed by Lisa Thomas NP and are within treatment parameters.  Per physician team, patient is ready for treatment and there are NO modifications to the treatment plan.  

## 2022-06-21 ENCOUNTER — Other Ambulatory Visit: Payer: Self-pay

## 2022-06-22 ENCOUNTER — Inpatient Hospital Stay: Payer: Commercial Managed Care - HMO

## 2022-06-22 VITALS — BP 155/58 | HR 58 | Temp 97.1°F | Resp 18

## 2022-06-22 DIAGNOSIS — Z5111 Encounter for antineoplastic chemotherapy: Secondary | ICD-10-CM | POA: Diagnosis not present

## 2022-06-22 DIAGNOSIS — C189 Malignant neoplasm of colon, unspecified: Secondary | ICD-10-CM

## 2022-06-22 MED ORDER — SODIUM CHLORIDE 0.9% FLUSH
10.0000 mL | INTRAVENOUS | Status: DC | PRN
  Administered 2022-06-22: 10 mL

## 2022-06-22 MED ORDER — HEPARIN SOD (PORK) LOCK FLUSH 100 UNIT/ML IV SOLN
500.0000 [IU] | Freq: Once | INTRAVENOUS | Status: AC | PRN
  Administered 2022-06-22: 500 [IU]

## 2022-06-22 NOTE — Patient Instructions (Signed)

## 2022-06-22 NOTE — Progress Notes (Signed)
Spanish Interpreter Sheppard Coil present during pump stop appointment.

## 2022-07-02 ENCOUNTER — Other Ambulatory Visit: Payer: Self-pay | Admitting: Oncology

## 2022-07-04 ENCOUNTER — Inpatient Hospital Stay: Payer: Commercial Managed Care - HMO

## 2022-07-04 ENCOUNTER — Encounter: Payer: Self-pay | Admitting: *Deleted

## 2022-07-04 ENCOUNTER — Inpatient Hospital Stay (HOSPITAL_BASED_OUTPATIENT_CLINIC_OR_DEPARTMENT_OTHER): Payer: Commercial Managed Care - HMO | Admitting: Oncology

## 2022-07-04 ENCOUNTER — Inpatient Hospital Stay: Payer: Commercial Managed Care - HMO | Attending: Oncology

## 2022-07-04 VITALS — BP 134/54 | HR 58

## 2022-07-04 VITALS — BP 125/58 | HR 83 | Temp 98.2°F | Resp 18 | Ht 68.0 in | Wt 169.2 lb

## 2022-07-04 DIAGNOSIS — D509 Iron deficiency anemia, unspecified: Secondary | ICD-10-CM | POA: Insufficient documentation

## 2022-07-04 DIAGNOSIS — C787 Secondary malignant neoplasm of liver and intrahepatic bile duct: Secondary | ICD-10-CM | POA: Insufficient documentation

## 2022-07-04 DIAGNOSIS — C189 Malignant neoplasm of colon, unspecified: Secondary | ICD-10-CM | POA: Diagnosis not present

## 2022-07-04 DIAGNOSIS — C187 Malignant neoplasm of sigmoid colon: Secondary | ICD-10-CM | POA: Insufficient documentation

## 2022-07-04 DIAGNOSIS — G620 Drug-induced polyneuropathy: Secondary | ICD-10-CM | POA: Diagnosis not present

## 2022-07-04 DIAGNOSIS — Z800 Family history of malignant neoplasm of digestive organs: Secondary | ICD-10-CM | POA: Insufficient documentation

## 2022-07-04 DIAGNOSIS — Z5111 Encounter for antineoplastic chemotherapy: Secondary | ICD-10-CM | POA: Insufficient documentation

## 2022-07-04 LAB — CBC WITH DIFFERENTIAL (CANCER CENTER ONLY)
Abs Immature Granulocytes: 0 10*3/uL (ref 0.00–0.07)
Basophils Absolute: 0 10*3/uL (ref 0.0–0.1)
Basophils Relative: 1 %
Eosinophils Absolute: 0.1 10*3/uL (ref 0.0–0.5)
Eosinophils Relative: 3 %
HCT: 38.8 % — ABNORMAL LOW (ref 39.0–52.0)
Hemoglobin: 13 g/dL (ref 13.0–17.0)
Immature Granulocytes: 0 %
Lymphocytes Relative: 29 %
Lymphs Abs: 1 10*3/uL (ref 0.7–4.0)
MCH: 30.2 pg (ref 26.0–34.0)
MCHC: 33.5 g/dL (ref 30.0–36.0)
MCV: 90 fL (ref 80.0–100.0)
Monocytes Absolute: 0.6 10*3/uL (ref 0.1–1.0)
Monocytes Relative: 18 %
Neutro Abs: 1.8 10*3/uL (ref 1.7–7.7)
Neutrophils Relative %: 49 %
Platelet Count: 139 10*3/uL — ABNORMAL LOW (ref 150–400)
RBC: 4.31 MIL/uL (ref 4.22–5.81)
RDW: 17.1 % — ABNORMAL HIGH (ref 11.5–15.5)
WBC Count: 3.6 10*3/uL — ABNORMAL LOW (ref 4.0–10.5)
nRBC: 0 % (ref 0.0–0.2)

## 2022-07-04 LAB — CMP (CANCER CENTER ONLY)
ALT: 14 U/L (ref 0–44)
AST: 20 U/L (ref 15–41)
Albumin: 4.1 g/dL (ref 3.5–5.0)
Alkaline Phosphatase: 58 U/L (ref 38–126)
Anion gap: 7 (ref 5–15)
BUN: 20 mg/dL (ref 8–23)
CO2: 24 mmol/L (ref 22–32)
Calcium: 9.6 mg/dL (ref 8.9–10.3)
Chloride: 105 mmol/L (ref 98–111)
Creatinine: 1.09 mg/dL (ref 0.61–1.24)
GFR, Estimated: >60 mL/min (ref >60)
Glucose, Bld: 137 mg/dL — ABNORMAL HIGH (ref 70–99)
Potassium: 4.2 mmol/L (ref 3.5–5.1)
Sodium: 136 mmol/L (ref 135–145)
Total Bilirubin: 0.5 mg/dL (ref 0.3–1.2)
Total Protein: 7.3 g/dL (ref 6.5–8.1)

## 2022-07-04 LAB — TOTAL PROTEIN, URINE DIPSTICK: Protein, ur: NEGATIVE mg/dL

## 2022-07-04 LAB — CEA (ACCESS): CEA (CHCC): 12.47 ng/mL — ABNORMAL HIGH (ref 0.00–5.00)

## 2022-07-04 MED ORDER — FLUOROURACIL CHEMO INJECTION 2.5 GM/50ML
300.0000 mg/m2 | Freq: Once | INTRAVENOUS | Status: AC
  Administered 2022-07-04: 600 mg via INTRAVENOUS
  Filled 2022-07-04: qty 12

## 2022-07-04 MED ORDER — SODIUM CHLORIDE 0.9 % IV SOLN
2000.0000 mg/m2 | INTRAVENOUS | Status: DC
  Administered 2022-07-04: 3850 mg via INTRAVENOUS
  Filled 2022-07-04: qty 77

## 2022-07-04 MED ORDER — SODIUM CHLORIDE 0.9 % IV SOLN
5.0000 mg/kg | Freq: Once | INTRAVENOUS | Status: AC
  Administered 2022-07-04: 400 mg via INTRAVENOUS
  Filled 2022-07-04: qty 16

## 2022-07-04 MED ORDER — SODIUM CHLORIDE 0.9 % IV SOLN
10.0000 mg | Freq: Once | INTRAVENOUS | Status: AC
  Administered 2022-07-04: 10 mg via INTRAVENOUS
  Filled 2022-07-04: qty 1

## 2022-07-04 MED ORDER — SODIUM CHLORIDE 0.9 % IV SOLN
300.0000 mg/m2 | Freq: Once | INTRAVENOUS | Status: AC
  Administered 2022-07-04: 580 mg via INTRAVENOUS
  Filled 2022-07-04: qty 29

## 2022-07-04 MED ORDER — SODIUM CHLORIDE 0.9 % IV SOLN
180.0000 mg/m2 | Freq: Once | INTRAVENOUS | Status: AC
  Administered 2022-07-04: 340 mg via INTRAVENOUS
  Filled 2022-07-04: qty 15

## 2022-07-04 MED ORDER — SODIUM CHLORIDE 0.9 % IV SOLN: Freq: Once | INTRAVENOUS | Status: AC

## 2022-07-04 MED ORDER — ATROPINE SULFATE 1 MG/ML IV SOLN
0.5000 mg | Freq: Once | INTRAVENOUS | Status: AC | PRN
  Administered 2022-07-04: 0.5 mg via INTRAVENOUS
  Filled 2022-07-04: qty 1

## 2022-07-04 MED ORDER — PALONOSETRON HCL INJECTION 0.25 MG/5ML
0.2500 mg | Freq: Once | INTRAVENOUS | Status: AC
  Administered 2022-07-04: 0.25 mg via INTRAVENOUS
  Filled 2022-07-04: qty 5

## 2022-07-04 NOTE — Patient Instructions (Addendum)
Instrucciones al darle de alta: The chemotherapy medication bag should finish at 46 hours, 96 hours, or 7 days. For example, if your pump is scheduled for 46 hours and it was put on at 4:00 p.m., it should finish at 2:00 p.m. the day it is scheduled to come off regardless of your appointment time.     Estimated time to finish at 11:30 Thursday, July 06, 2022.   If the display on your pump reads "Low Volume" and it is beeping, take the batteries out of the pump and come to the cancer center for it to be taken off.   If the pump alarms go off prior to the pump reading "Low Volume" then call [phone] and someone can assist you.  If the plunger comes out and the chemotherapy medication is leaking out, please use your home chemo spill kit to clean up the spill. Do NOT use paper towels or other household products.  If you have problems or questions regarding your pump, please call either [phone] (24 hours a day) or the cancer center Monday-Friday 8:00 a.m.- 4:30 p.m. at the clinic number and we will assist you. If you are unable to get assistance, then go to the nearest Emergency Department and ask the staff to contact the IV team for assistance.  Discharge Instructions Gracias por elegir al [facility] de [facility] [place] para brindarle atencin mdica de oncologa y Music therapist.   Si usted tiene una cita de laboratorio con [place], por favor vaya directamente [place] y regstrese en el rea de Control and instrumentation engineer.   Use ropa cmoda y [place] para tener fcil acceso a las vas del Portacath (acceso venoso de Engineer, site duracin) o la lnea PICC (catter central colocado por va perifrica).   Nos esforzamos por ofrecerle tiempo de calidad con su proveedor. Es posible que tenga que volver a programar su cita si llega tarde (15 minutos o ms).  El llegar tarde le afecta a usted y a otros pacientes cuyas citas son posteriores a Merchandiser, retail.  Adems, si usted falta a tres o ms citas sin  avisar a la oficina, puede ser retirado(a) de la clnica a discrecin del proveedor.      Para las solicitudes de renovacin de recetas, pida a su farmacia que se ponga en contacto con nuestra oficina y deje que transcurran 24 horas para que se complete el proceso de las renovaciones.    Hoy usted recibi los siguientes agentes de quimioterapia e/o inmunoterapia Bevacizumab, Irinotecan, Leucovorin, Fluorouracil.      Para ayudar a prevenir las nuseas y los vmitos despus de su tratamiento, le recomendamos que tome su medicamento para las nuseas segn las indicaciones.  LOS SNTOMAS QUE DEBEN COMUNICARSE INMEDIATAMENTE SE INDICAN A CONTINUACIN: *FIEBRE SUPERIOR A 100.4 F (38 C) O MS *ESCALOFROS O SUDORACIN *NUSEAS Y VMITOS QUE NO SE CONTROLAN CON EL MEDICAMENTO PARA LAS NUSEAS *DIFICULTAD INUSUAL PARA RESPIRAR  *MORETONES O HEMORRAGIAS NO HABITUALES *PROBLEMAS URINARIOS (dolor o ardor al Garment/textile technologist o frecuencia para Garment/textile technologist) *PROBLEMAS INTESTINALES (diarrea inusual, estreimiento, dolor cerca del ano) SENSIBILIDAD EN LA BOCA Y EN LA GARGANTA CON O SIN LA PRESENCIA DE LCERAS (dolor de garganta, llagas en la boca o dolor de muelas/dientes) ERUPCIN, HINCHAZN O DOLORES INUSUALES FLUJO VAGINAL INUSUAL O PICAZN/RASQUIA    Los puntos marcados con un asterisco ( *) indican una posible emergencia y debe hacer un seguimiento tan pronto como le sea posible o vaya al Departamento de Emergencias si se le  presenta algn problema.  Por favor, muestre la [place] DE ADVERTENCIA DE [place] DE ADVERTENCIA DE Benay Spice al registrarse en [address] de Emergencias y a la enfermera de triaje.  Si tiene preguntas despus de su visita o necesita cancelar o volver a programar su cita, por favor pngase en contacto con [facility]  Dept: [phone]  y [place] instrucciones. Las horas de oficina son de 8:00 a.m. a 4:30 p.m. de lunes a viernes.  Por favor, tenga en cuenta que los mensajes de voz que se dejan despus de las 4:00 p.m. posiblemente no se devolvern hasta el siguiente da de [place].  Cerramos los fines de semana y [employer]. En todo momento tiene acceso a una enfermera para preguntas urgentes. Por favor, llame al nmero principal de la clnica Dept: [phone] y [place] instrucciones.   Para cualquier pregunta que no sea de carcter urgente, tambin puede ponerse en contacto con su proveedor [employer] Ahora ofrecemos visitas electrnicas para cualquier persona mayor de 18 aos que solicite atencin mdica en lnea para los sntomas que no sean urgentes. Para ms detalles vaya a [email or web address].   Tambin puede bajar la aplicacin de MyChart! Vaya a la tienda de aplicaciones, busque "MyChart", abra la aplicacin, seleccione [place], e ingrese con su nombre de usuario y la contrasea de Pharmacist, community.  Bevacizumab Injection What is this medication? BEVACIZUMAB (be va SIZ yoo mab) treats some types of cancer. It works by blocking a protein that causes cancer cells to grow and multiply. This helps to slow or stop the spread of cancer cells. It is a monoclonal antibody. This medicine may be used for other purposes; ask your health care provider or pharmacist if you have questions. COMMON BRAND NAME(S): Alymsys, Avastin, MVASI, Noah Charon What should I tell my care team before I take this medication? They need to know if you have any of these conditions: Blood clots Coughing up blood Having or recent surgery Heart failure High blood pressure History of a connection between 2 or more body parts that do not usually connect (fistula) History of a tear in your stomach or intestines Protein in your urine An unusual or allergic reaction to bevacizumab, other medications, foods, dyes, or preservatives Pregnant or trying to get pregnant Breast-feeding How should I use this medication? This  medication is injected into a vein. It is given by your care team in a hospital or clinic setting. Talk to your care team the use of this medication in children. Special care may be needed. Overdosage: If you think you have taken too much of this medicine contact a poison control center or emergency room at once. NOTE: This medicine is only for you. Do not share this medicine with others. What if I miss a dose? Keep appointments for follow-up doses. It is important not to miss your dose. Call your care team if you are unable to keep an appointment. What may interact with this medication? Interactions are not expected. This list may not describe all possible interactions. Give your health care provider a list of all the medicines, herbs, non-prescription drugs, or dietary supplements you use. Also tell them if you smoke, drink alcohol, or use illegal drugs. Some items may interact with your medicine. What should I watch for while using this medication? Your condition will be monitored carefully while you are receiving this medication. You may need blood work while taking this medication. This medication may make you  feel generally unwell. This is not uncommon as chemotherapy can affect healthy cells as well as cancer cells. Report any side effects. Continue your course of treatment even though you feel ill unless your care team tells you to stop. This medication may increase your risk to bruise or bleed. Call your care team if you notice any unusual bleeding. Before having surgery, talk to your care team to make sure it is ok. This medication can increase the risk of poor healing of your surgical site or wound. You will need to stop this medication for 28 days before surgery. After surgery, wait at least 28 days before restarting this medication. Make sure the surgical site or wound is healed enough before restarting this medication. Talk to your care team if questions. Talk to your care team if you may  be pregnant. Serious birth defects can occur if you take this medication during pregnancy and for 6 months after the last dose. Contraception is recommended while taking this medication and for 6 months after the last dose. Your care team can help you find the option that works for you. Do not breastfeed while taking this medication and for 6 months after the last dose. This medication can cause infertility. Talk to your care team if you are concerned about your fertility. What side effects may I notice from receiving this medication? Side effects that you should report to your care team as soon as possible: Allergic reactions--skin rash, itching, hives, swelling of the face, lips, tongue, or throat Bleeding--bloody or black, tar-like stools, vomiting blood or brown material that looks like coffee grounds, red or dark brown urine, small red or purple spots on skin, unusual bruising or bleeding Blood clot--pain, swelling, or warmth in the leg, shortness of breath, chest pain Heart attack--pain or tightness in the chest, shoulders, arms, or jaw, nausea, shortness of breath, cold or clammy skin, feeling faint or lightheaded Heart failure--shortness of breath, swelling of the ankles, feet, or hands, sudden weight gain, unusual weakness or fatigue Increase in blood pressure Infection--fever, chills, cough, sore throat, wounds that don't heal, pain or trouble when passing urine, general feeling of discomfort or being unwell Infusion reactions--chest pain, shortness of breath or trouble breathing, feeling faint or lightheaded Kidney injury--decrease in the amount of urine, swelling of the ankles, hands, or feet Stomach pain that is severe, does not go away, or gets worse Stroke--sudden numbness or weakness of the face, arm, or leg, trouble speaking, confusion, trouble walking, loss of balance or coordination, dizziness, severe headache, change in vision Sudden and severe headache, confusion, change in  vision, seizures, which may be signs of posterior reversible encephalopathy syndrome (PRES) Side effects that usually do not require medical attention (report to your care team if they continue or are bothersome): Back pain Change in taste Diarrhea Dry skin Increased tears Nosebleed This list may not describe all possible side effects. Call your doctor for medical advice about side effects. You may report side effects to FDA at 1-800-FDA-1088. Where should I keep my medication? This medication is given in a hospital or clinic. It will not be stored at home. NOTE: This sheet is a summary. It may not cover all possible information. If you have questions about this medicine, talk to your doctor, pharmacist, or health care provider.  2023 Elsevier/Gold Standard (2021-08-12 00:00:00)  Irinotecan Injection What is this medication? IRINOTECAN (ir in oh TEE kan) treats some types of cancer. It works by slowing down the growth of cancer cells.  This medicine may be used for other purposes; ask your health care provider or pharmacist if you have questions. COMMON BRAND NAME(S): Camptosar What should I tell my care team before I take this medication? They need to know if you have any of these conditions: Dehydration Diarrhea Infection, especially a viral infection, such as chickenpox, cold sores, herpes Liver disease Low blood cell levels (white cells, red cells, and platelets) Low levels of electrolytes, such as calcium, magnesium, or potassium in your blood Recent or ongoing radiation An unusual or allergic reaction to irinotecan, other medications, foods, dyes, or preservatives If you or your partner are pregnant or trying to get pregnant Breast-feeding How should I use this medication? This medication is injected into a vein. It is given by your care team in a hospital or clinic setting. Talk to your care team about the use of this medication in children. Special care may be  needed. Overdosage: If you think you have taken too much of this medicine contact a poison control center or emergency room at once. NOTE: This medicine is only for you. Do not share this medicine with others. What if I miss a dose? Keep appointments for follow-up doses. It is important not to miss your dose. Call your care team if you are unable to keep an appointment. What may interact with this medication? Do not take this medication with any of the following: Cobicistat Itraconazole This medication may also interact with the following: Certain antibiotics, such as clarithromycin, rifampin, rifabutin Certain antivirals for HIV or AIDS Certain medications for fungal infections, such as ketoconazole, posaconazole, voriconazole Certain medications for seizures, such as carbamazepine, phenobarbital, phenytoin Gemfibrozil Nefazodone St. John's wort This list may not describe all possible interactions. Give your health care provider a list of all the medicines, herbs, non-prescription drugs, or dietary supplements you use. Also tell them if you smoke, drink alcohol, or use illegal drugs. Some items may interact with your medicine. What should I watch for while using this medication? Your condition will be monitored carefully while you are receiving this medication. You may need blood work while taking this medication. This medication may make you feel generally unwell. This is not uncommon as chemotherapy can affect healthy cells as well as cancer cells. Report any side effects. Continue your course of treatment even though you feel ill unless your care team tells you to stop. This medication can cause serious side effects. To reduce the risk, your care team may give you other medications to take before receiving this one. Be sure to follow the directions from your care team. This medication may affect your coordination, reaction time, or judgement. Do not drive or operate machinery until you know  how this medication affects you. Sit up or stand slowly to reduce the risk of dizzy or fainting spells. Drinking alcohol with this medication can increase the risk of these side effects. This medication may increase your risk of getting an infection. Call your care team for advice if you get a fever, chills, sore throat, or other symptoms of a cold or flu. Do not treat yourself. Try to avoid being around people who are sick. Avoid taking medications that contain aspirin, acetaminophen, ibuprofen, naproxen, or ketoprofen unless instructed by your care team. These medications may hide a fever. This medication may increase your risk to bruise or bleed. Call your care team if you notice any unusual bleeding. Be careful brushing or flossing your teeth or using a toothpick because you may get  an infection or bleed more easily. If you have any dental work done, tell your dentist you are receiving this medication. Talk to your care team if you or your partner are pregnant or think either of you might be pregnant. This medication can cause serious birth defects if taken during pregnancy and for 6 months after the last dose. You will need a negative pregnancy test before starting this medication. Contraception is recommended while taking this medication and for 6 months after the last dose. Your care team can help you find the option that works for you. Do not father a child while taking this medication and for 3 months after the last dose. Use a condom for contraception during this time period. Do not breastfeed while taking this medication and for 7 days after the last dose. This medication may cause infertility. Talk to your care team if you are concerned about your fertility. What side effects may I notice from receiving this medication? Side effects that you should report to your care team as soon as possible: Allergic reactions--skin rash, itching, hives, swelling of the face, lips, tongue, or throat Dry  cough, shortness of breath or trouble breathing Increased saliva or tears, increased sweating, stomach cramping, diarrhea, small pupils, unusual weakness or fatigue, slow heartbeat Infection--fever, chills, cough, sore throat, wounds that don't heal, pain or trouble when passing urine, general feeling of discomfort or being unwell Kidney injury--decrease in the amount of urine, swelling of the ankles, hands, or feet Low red blood cell level--unusual weakness or fatigue, dizziness, headache, trouble breathing Severe or prolonged diarrhea Unusual bruising or bleeding Side effects that usually do not require medical attention (report to your care team if they continue or are bothersome): Constipation Diarrhea Hair loss Loss of appetite Nausea Stomach pain This list may not describe all possible side effects. Call your doctor for medical advice about side effects. You may report side effects to FDA at 1-800-FDA-1088. Where should I keep my medication? This medication is given in a hospital or clinic. It will not be stored at home. NOTE: This sheet is a summary. It may not cover all possible information. If you have questions about this medicine, talk to your doctor, pharmacist, or health care provider.  2023 Elsevier/Gold Standard (2021-08-18 00:00:00)  Leucovorin Injection What is this medication? LEUCOVORIN (loo koe VOR in) prevents side effects from certain medications, such as methotrexate. It works by increasing folate levels. This helps protect healthy cells in your body. It may also be used to treat anemia caused by low levels of folate. It can also be used with fluorouracil, a type of chemotherapy, to treat colorectal cancer. It works by increasing the effects of fluorouracil in the body. This medicine may be used for other purposes; ask your health care provider or pharmacist if you have questions. What should I tell my care team before I take this medication? They need to know if you  have any of these conditions: Anemia from low levels of vitamin B12 in the blood An unusual or allergic reaction to leucovorin, folic acid, other medications, foods, dyes, or preservatives Pregnant or trying to get pregnant Breastfeeding How should I use this medication? This medication is injected into a vein or a muscle. It is given by your care team in a hospital or clinic setting. Talk to your care team about the use of this medication in children. Special care may be needed. Overdosage: If you think you have taken too much of this medicine contact  a poison control center or emergency room at once. NOTE: This medicine is only for you. Do not share this medicine with others. What if I miss a dose? Keep appointments for follow-up doses. It is important not to miss your dose. Call your care team if you are unable to keep an appointment. What may interact with this medication? Capecitabine Fluorouracil Phenobarbital Phenytoin Primidone Trimethoprim;sulfamethoxazole This list may not describe all possible interactions. Give your health care provider a list of all the medicines, herbs, non-prescription drugs, or dietary supplements you use. Also tell them if you smoke, drink alcohol, or use illegal drugs. Some items may interact with your medicine. What should I watch for while using this medication? Your condition will be monitored carefully while you are receiving this medication. This medication may increase the side effects of 5-fluorouracil. Tell your care team if you have diarrhea or mouth sores that do not get better or that get worse. What side effects may I notice from receiving this medication? Side effects that you should report to your care team as soon as possible: Allergic reactions--skin rash, itching, hives, swelling of the face, lips, tongue, or throat This list may not describe all possible side effects. Call your doctor for medical advice about side effects. You may report  side effects to FDA at 1-800-FDA-1088. Where should I keep my medication? This medication is given in a hospital or clinic. It will not be stored at home. NOTE: This sheet is a summary. It may not cover all possible information. If you have questions about this medicine, talk to your doctor, pharmacist, or health care provider.  2023 Elsevier/Gold Standard (2021-08-19 00:00:00)  Fluorouracil Injection What is this medication? FLUOROURACIL (flure oh YOOR a sil) treats some types of cancer. It works by slowing down the growth of cancer cells. This medicine may be used for other purposes; ask your health care provider or pharmacist if you have questions. COMMON BRAND NAME(S): Adrucil What should I tell my care team before I take this medication? They need to know if you have any of these conditions: Blood disorders Dihydropyrimidine dehydrogenase (DPD) deficiency Infection, such as chickenpox, cold sores, herpes Kidney disease Liver disease Poor nutrition Recent or ongoing radiation therapy An unusual or allergic reaction to fluorouracil, other medications, foods, dyes, or preservatives If you or your partner are pregnant or trying to get pregnant Breast-feeding How should I use this medication? This medication is injected into a vein. It is administered by your care team in a hospital or clinic setting. Talk to your care team about the use of this medication in children. Special care may be needed. Overdosage: If you think you have taken too much of this medicine contact a poison control center or emergency room at once. NOTE: This medicine is only for you. Do not share this medicine with others. What if I miss a dose? Keep appointments for follow-up doses. It is important not to miss your dose. Call your care team if you are unable to keep an appointment. What may interact with this medication? Do not take this medication with any of the following: Live virus vaccines This medication  may also interact with the following: Medications that treat or prevent blood clots, such as warfarin, enoxaparin, dalteparin This list may not describe all possible interactions. Give your health care provider a list of all the medicines, herbs, non-prescription drugs, or dietary supplements you use. Also tell them if you smoke, drink alcohol, or use illegal drugs. Some items  may interact with your medicine. What should I watch for while using this medication? Your condition will be monitored carefully while you are receiving this medication. This medication may make you feel generally unwell. This is not uncommon as chemotherapy can affect healthy cells as well as cancer cells. Report any side effects. Continue your course of treatment even though you feel ill unless your care team tells you to stop. In some cases, you may be given additional medications to help with side effects. Follow all directions for their use. This medication may increase your risk of getting an infection. Call your care team for advice if you get a fever, chills, sore throat, or other symptoms of a cold or flu. Do not treat yourself. Try to avoid being around people who are sick. This medication may increase your risk to bruise or bleed. Call your care team if you notice any unusual bleeding. Be careful brushing or flossing your teeth or using a toothpick because you may get an infection or bleed more easily. If you have any dental work done, tell your dentist you are receiving this medication. Avoid taking medications that contain aspirin, acetaminophen, ibuprofen, naproxen, or ketoprofen unless instructed by your care team. These medications may hide a fever. Do not treat diarrhea with over the counter products. Contact your care team if you have diarrhea that lasts more than 2 days or if it is severe and watery. This medication can make you more sensitive to the sun. Keep out of the sun. If you cannot avoid being in the sun,  wear protective clothing and sunscreen. Do not use sun lamps, tanning beds, or tanning booths. Talk to your care team if you or your partner wish to become pregnant or think you might be pregnant. This medication can cause serious birth defects if taken during pregnancy and for 3 months after the last dose. A reliable form of contraception is recommended while taking this medication and for 3 months after the last dose. Talk to your care team about effective forms of contraception. Do not father a child while taking this medication and for 3 months after the last dose. Use a condom while having sex during this time period. Do not breastfeed while taking this medication. This medication may cause infertility. Talk to your care team if you are concerned about your fertility. What side effects may I notice from receiving this medication? Side effects that you should report to your care team as soon as possible: Allergic reactions--skin rash, itching, hives, swelling of the face, lips, tongue, or throat Heart attack--pain or tightness in the chest, shoulders, arms, or jaw, nausea, shortness of breath, cold or clammy skin, feeling faint or lightheaded Heart failure--shortness of breath, swelling of the ankles, feet, or hands, sudden weight gain, unusual weakness or fatigue Heart rhythm changes--fast or irregular heartbeat, dizziness, feeling faint or lightheaded, chest pain, trouble breathing High ammonia level--unusual weakness or fatigue, confusion, loss of appetite, nausea, vomiting, seizures Infection--fever, chills, cough, sore throat, wounds that don't heal, pain or trouble when passing urine, general feeling of discomfort or being unwell Low red blood cell level--unusual weakness or fatigue, dizziness, headache, trouble breathing Pain, tingling, or numbness in the hands or feet, muscle weakness, change in vision, confusion or trouble speaking, loss of balance or coordination, trouble walking,  seizures Redness, swelling, and blistering of the skin over hands and feet Severe or prolonged diarrhea Unusual bruising or bleeding Side effects that usually do not require medical attention (report to  your care team if they continue or are bothersome): Dry skin Headache Increased tears Nausea Pain, redness, or swelling with sores inside the mouth or throat Sensitivity to light Vomiting This list may not describe all possible side effects. Call your doctor for medical advice about side effects. You may report side effects to FDA at 1-800-FDA-1088. Where should I keep my medication? This medication is given in a hospital or clinic. It will not be stored at home. NOTE: This sheet is a summary. It may not cover all possible information. If you have questions about this medicine, talk to your doctor, pharmacist, or health care provider.  2023 Elsevier/Gold Standard (2021-08-09 00:00:00)

## 2022-07-04 NOTE — Progress Notes (Signed)
Patient seen by Dr. Sherrill today ? ?Vitals are within treatment parameters. ? ?Labs reviewed by Dr. Sherrill and are within treatment parameters. ? ?Per physician team, patient is ready for treatment and there are NO modifications to the treatment plan.  ?

## 2022-07-04 NOTE — Progress Notes (Signed)
Mariel from [facility] at chairside during flush and infusion appointment interpreting.

## 2022-07-04 NOTE — Progress Notes (Signed)
Visit today assisted by Spanish interpreter, [facility] w/[facility]

## 2022-07-04 NOTE — Progress Notes (Signed)
[place] OFFICE PROGRESS NOTE   Diagnosis: Colon cancer  INTERVAL HISTORY:   John Vance completed a cycle of FOLFIRI/Avastin on 06/20/2022.  No nausea, mouth sores, or diarrhea.  He has cold sensitivity in the fingers.  He feels well. He underwent cataract surgery last week.  His vision has improved.  He plans to have the other cataract removed next week.  Objective:  Vital signs in last 24 hours:  Blood pressure (!) 125/58, pulse 83, temperature 98.2 F (36.8 C), temperature source Oral, resp. rate 18, height '5\' 8"'$  (1.727 m), weight 169 lb 3.2 oz (76.7 kg), SpO2 100 %.    HEENT: No thrush or ulcers Resp: Lungs clear bilaterally Cardio: Regular rate and rhythm GI: No hepatosplenomegaly, nontender, no mass Vascular: No leg edema Skin: Skin thickening and mild hyperpigmentation of the hands  Portacath/PICC-without erythema  Lab Results:  Lab Results  Component Value Date   WBC 3.6 (L) 07/04/2022   HGB 13.0 07/04/2022   HCT 38.8 (L) 07/04/2022   MCV 90.0 07/04/2022   PLT 139 (L) 07/04/2022   NEUTROABS 1.8 07/04/2022    CMP  Lab Results  Component Value Date   NA 136 07/04/2022   K 4.2 07/04/2022   CL 105 07/04/2022   CO2 24 07/04/2022   GLUCOSE 137 (H) 07/04/2022   BUN 20 07/04/2022   CREATININE 1.09 07/04/2022   CALCIUM 9.6 07/04/2022   PROT 7.3 07/04/2022   ALBUMIN 4.1 07/04/2022   AST 20 07/04/2022   ALT 14 07/04/2022   ALKPHOS 58 07/04/2022   BILITOT 0.5 07/04/2022   GFRNONAA >60 07/04/2022   GFRAA >60 12/01/2019    Lab Results  Component Value Date   CEA1 12.10 (H) 01/03/2021   CEA 11.03 (H) 06/06/2022    Medications: I have reviewed the patient's current medications.   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), transverse colon cancer stage IIB (T4aN0) status post extended right hemicolectomy 05/07/2019,  MSS Tumor invasive superficial portion of the muscle ("internal third "), no tumor perforation, no vascular invasion or perineural invasion, negative resection margins, 0/4 lymph nodes Elevated CEA 02/10/2019 CTs 03/05/2019, compared to outside CTs from 11/22/2018-worsened wall thickening in the cecum stable apple core lesion in the mid transverse colon, no evidence of recurrent tumor at the distal colonic anastomosis, no evidence of metastatic disease Colonoscopy 03/24/2019 20-2 malignant appearing masses noted in the colon, 1 filled the cecum measuring approximately 4 cm across and friable.  The other malignant appearing mass was circumferential creating a stricture with a 1.2 cm lumen located in the transverse segment approximately 4 to 5 cm in length.  There were 12-18 neoplastic appearing polyps in between the 2 obvious malignant masses ranging in size from 3 mm to 2 cm.  5 polyps distal to the transverse colon mass.  2 sigmoid colon polyps.  Cecum mass positive for adenocarcinoma.  Transverse colon mass positive for adenocarcinoma. Foundation 1 transverse colon-MSS, tumor mutation burden 7, KRAS wild-type, BRAF fusion 05/07/2019 laparoscopic extended right hemicolectomy, lysis of adhesions-5 cm invasive moderately differentiated adenocarcinoma involving cecum and proximal ascending colon, carcinoma invades into the pericolonic soft tissue; 7 cm invasive moderately differentiated carcinoma involving the transverse colon, carcinoma invades into the serosal surface (visceral peritoneum); all resection margins negative for carcinoma.  Lymphovascular invasion present.  In the proximal colon metastatic carcinoma involves 3 of 17 lymph nodes with 1 tumor  deposit.  In the transverse colon 12 lymph nodes negative for metastatic carcinoma.  3 separate polyps: Tubular adenoma, inflammatory polyp and hyperplastic polyp Cycle 1 capecitabine 06/02/2019 Cycle 2 capecitabine 06/23/2019 Cycle 3 capecitabine 07/14/2019 Cycle  4 capecitabine 08/04/2019 Cycle 5 capecitabine 08/25/2019 Cycle 6 capecitabine 09/15/2019 Cycle 7 capecitabine 10/06/2019 Cycle 8 capecitabine 10/27/2019 Upper endoscopy 07/23/2020-negative Colonoscopy 07/23/2020-normal-appearing anastomoses, polyp removed from the descending colon-tubular adenoma CTs 01/18/2021-right abdominal mesenteric mass, mass in the low anatomic pelvis consistent with metastases Cycle 1 FOLFOX 02/21/2021 Cycle 2 FOLFOX 03/07/2021, dose reductions made due to mild neutropenia Cycle 3 FOLFOX 03/21/2021 Cycle 4 FOLFOX 04/04/2021 Cycle 5 FOLFOX 04/19/2021 Cycle 6 FOLFOX 05/02/2021 CTs 05/12/2021-decrease size of nodular right mesenteric mass, complete resolution of soft tissue enhancing nodule in the right inguinal canal, resolution of nodular soft tissue mass posterior to the bladder, enlargement of a left upper lobe nodule and new 4 mm left upper lobe nodule-indeterminate Cycle 7 FOLFOX 05/16/2021 Cycle 8 FOLFOX 05/30/2021, oxaliplatin and 5-FU bolus held due to neuropathy and thrombocytopenia Cycle 9 FOLFOX 06/13/2021, oxaliplatin dose reduced Cycle 10 FOLFOX 06/27/2021 CT 07/07/2021-decrease in right mesenteric mass, decreased size of lobulated left upper lobe nodule, resolution of previous "new "left upper lobe nodule, new area of wall thickening at the distal transverse colon Treatment changed to Xeloda 2 weeks on/1 week off beginning 07/18/2021 Cycle 2 Xeloda beginning 08/09/2021 Cycle 3 Xeloda beginning 08/30/2021 Cycle 4 Xeloda beginning 09/21/2021 Cycle 5 Xeloda beginning 10/12/2021 Cycle 6 Xeloda beginning 11/02/2021 CTs 11/14/2021-right lower quadrant spiculated small bowel mesenteric mass measured 2.8 x 3.7 cm, previously 1.0 x 2.3 cm; adjacent mesenteric haziness and nodularity measuring up to 8 mm superiorly.  New 4 mm nodular lesion posterior segment right upper lobe along the major fissure. Treatment break beginning 11/17/2021 CTs 03/21/2022-multiple new liver metastases, enlarging  implant in the right ileocolic mesentery, enlarging peritoneal implants in the low pelvis Cycle 1 FOLFIRI/Avastin 03/29/2022 Cycle 2 FOLFIRI/Avastin 04/11/2022 Cycle 3 FOLFIRI/Avastin 04/26/2022 Cycle 4 FOLFIRI/Avastin 05/10/2022 Cycle 5 FOLFIRI/Avastin 05/24/2022 CT abdomen/pelvis 06/02/2022-liver metastases are stable and smaller, smaller peritoneal/mesenteric nodules Cycle 6 FOLFIRI/Avastin 06/06/2022 Cycle 7 FOLFIRI/Avastin 06/20/2022 Cycle 8 FOLFIRI/Avastin 07/04/2022 2.   Microcytic anemia secondary to #1.    Resolved 3.   Family history of pancreas and colon cancer, negative genetic testing per the [employer] panel, ATM variant of unknown significance 4.   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  He continues to tolerate the FOLFIRI/Avastin well.  He will pleat another cycle today.  He will be referred for restaging CTs after cycle 10.  He will return for an office visit and chemotherapy in 2 weeks.  Betsy Coder, MD  07/04/2022  9:33 AM

## 2022-07-06 ENCOUNTER — Inpatient Hospital Stay: Payer: Commercial Managed Care - HMO

## 2022-07-06 VITALS — BP 156/68 | HR 57 | Temp 98.4°F | Resp 20

## 2022-07-06 DIAGNOSIS — C189 Malignant neoplasm of colon, unspecified: Secondary | ICD-10-CM

## 2022-07-06 DIAGNOSIS — Z5111 Encounter for antineoplastic chemotherapy: Secondary | ICD-10-CM | POA: Diagnosis not present

## 2022-07-06 MED ORDER — SODIUM CHLORIDE 0.9% FLUSH
10.0000 mL | INTRAVENOUS | Status: DC | PRN
  Administered 2022-07-06: 10 mL

## 2022-07-06 MED ORDER — HEPARIN SOD (PORK) LOCK FLUSH 100 UNIT/ML IV SOLN
500.0000 [IU] | Freq: Once | INTRAVENOUS | Status: AC | PRN
  Administered 2022-07-06: 500 [IU]

## 2022-07-06 NOTE — Progress Notes (Signed)
Interpreter Zoila from [facility] at chairside interpreting for patient.

## 2022-07-16 ENCOUNTER — Other Ambulatory Visit: Payer: Self-pay | Admitting: Oncology

## 2022-07-16 DIAGNOSIS — C189 Malignant neoplasm of colon, unspecified: Secondary | ICD-10-CM

## 2022-07-18 ENCOUNTER — Inpatient Hospital Stay: Payer: Commercial Managed Care - HMO

## 2022-07-18 ENCOUNTER — Inpatient Hospital Stay: Payer: Commercial Managed Care - HMO | Admitting: Oncology

## 2022-07-18 ENCOUNTER — Encounter: Payer: Self-pay | Admitting: *Deleted

## 2022-07-18 ENCOUNTER — Encounter: Payer: Self-pay | Admitting: Oncology

## 2022-07-18 VITALS — BP 151/69 | HR 78 | Temp 98.2°F | Resp 18 | Ht 68.0 in | Wt 168.0 lb

## 2022-07-18 DIAGNOSIS — C189 Malignant neoplasm of colon, unspecified: Secondary | ICD-10-CM

## 2022-07-18 DIAGNOSIS — Z5111 Encounter for antineoplastic chemotherapy: Secondary | ICD-10-CM | POA: Diagnosis not present

## 2022-07-18 LAB — CMP (CANCER CENTER ONLY)
ALT: 13 U/L (ref 0–44)
AST: 16 U/L (ref 15–41)
Albumin: 4.1 g/dL (ref 3.5–5.0)
Alkaline Phosphatase: 71 U/L (ref 38–126)
Anion gap: 6 (ref 5–15)
BUN: 15 mg/dL (ref 8–23)
CO2: 26 mmol/L (ref 22–32)
Calcium: 9.7 mg/dL (ref 8.9–10.3)
Chloride: 107 mmol/L (ref 98–111)
Creatinine: 0.95 mg/dL (ref 0.61–1.24)
GFR, Estimated: >60 mL/min (ref >60)
Glucose, Bld: 136 mg/dL — ABNORMAL HIGH (ref 70–99)
Potassium: 3.8 mmol/L (ref 3.5–5.1)
Sodium: 139 mmol/L (ref 135–145)
Total Bilirubin: 0.5 mg/dL (ref 0.3–1.2)
Total Protein: 7 g/dL (ref 6.5–8.1)

## 2022-07-18 LAB — CBC WITH DIFFERENTIAL (CANCER CENTER ONLY)
Abs Immature Granulocytes: 0.01 10*3/uL (ref 0.00–0.07)
Basophils Absolute: 0 10*3/uL (ref 0.0–0.1)
Basophils Relative: 1 %
Eosinophils Absolute: 0.2 10*3/uL (ref 0.0–0.5)
Eosinophils Relative: 5 %
HCT: 37.8 % — ABNORMAL LOW (ref 39.0–52.0)
Hemoglobin: 12.6 g/dL — ABNORMAL LOW (ref 13.0–17.0)
Immature Granulocytes: 0 %
Lymphocytes Relative: 37 %
Lymphs Abs: 1.2 10*3/uL (ref 0.7–4.0)
MCH: 30.1 pg (ref 26.0–34.0)
MCHC: 33.3 g/dL (ref 30.0–36.0)
MCV: 90.4 fL (ref 80.0–100.0)
Monocytes Absolute: 0.4 10*3/uL (ref 0.1–1.0)
Monocytes Relative: 14 %
Neutro Abs: 1.4 10*3/uL — ABNORMAL LOW (ref 1.7–7.7)
Neutrophils Relative %: 43 %
Platelet Count: 180 10*3/uL (ref 150–400)
RBC: 4.18 MIL/uL — ABNORMAL LOW (ref 4.22–5.81)
RDW: 17 % — ABNORMAL HIGH (ref 11.5–15.5)
WBC Count: 3.2 10*3/uL — ABNORMAL LOW (ref 4.0–10.5)
nRBC: 0 % (ref 0.0–0.2)

## 2022-07-18 LAB — TOTAL PROTEIN, URINE DIPSTICK: Protein, ur: NEGATIVE mg/dL

## 2022-07-18 LAB — CEA (ACCESS): CEA (CHCC): 11.45 ng/mL — ABNORMAL HIGH (ref 0.00–5.00)

## 2022-07-18 MED ORDER — SODIUM CHLORIDE 0.9% FLUSH
10.0000 mL | INTRAVENOUS | Status: AC | PRN
  Administered 2022-07-18: 10 mL via INTRAVENOUS

## 2022-07-18 MED ORDER — HEPARIN SOD (PORK) LOCK FLUSH 100 UNIT/ML IV SOLN
500.0000 [IU] | Freq: Once | INTRAVENOUS | Status: AC
  Administered 2022-07-18: 500 [IU] via INTRAVENOUS

## 2022-07-18 NOTE — Addendum Note (Signed)
Addended by: Velna Hatchet on: 07/18/2022 09:56 AM   Modules accepted: Orders

## 2022-07-18 NOTE — Progress Notes (Addendum)
Patient seen by Dr. Benay Spice today  Vitals are within treatment parameters.Intervention not need for SBP 151  Labs reviewed by Dr. Benay Spice and are not all within treatment parameters. ANC 1.4  Per physician team, patient will not be receiving treatment today. Unable to get Neulasta approved for treatment today. Will reschedule for 1 week.

## 2022-07-18 NOTE — Patient Instructions (Signed)

## 2022-07-18 NOTE — Progress Notes (Signed)
[place] OFFICE PROGRESS NOTE   Diagnosis: Colon cancer   INTERVAL HISTORY:   John Vance completed another cycle of FOLFIRI/bevacizumab on 07/04/2022.  No mouth sores or diarrhea.  He has soreness at the anterior inner gumline.  No diarrhea.  Objective:  Vital signs in last 24 hours:  Blood pressure (!) 151/69, pulse 78, temperature 98.2 F (36.8 C), temperature source Oral, resp. rate 18, height 5\' 8"  (1.727 m), weight 168 lb (76.2 kg), SpO2 99 %.    HEENT: No thrush or ulcers, examination of the anterior inner gum is unremarkable Resp: Lungs clear bilaterally Cardio: Regular rate and rhythm GI: No hepatosplenomegaly, nontender Vascular: No leg edema  Portacath/PICC-without erythema  Lab Results:  Lab Results  Component Value Date   WBC 3.2 (L) 07/18/2022   HGB 12.6 (L) 07/18/2022   HCT 37.8 (L) 07/18/2022   MCV 90.4 07/18/2022   PLT 180 07/18/2022   NEUTROABS 1.4 (L) 07/18/2022    CMP  Lab Results  Component Value Date   NA 136 07/04/2022   K 4.2 07/04/2022   CL 105 07/04/2022   CO2 24 07/04/2022   GLUCOSE 137 (H) 07/04/2022   BUN 20 07/04/2022   CREATININE 1.09 07/04/2022   CALCIUM 9.6 07/04/2022   PROT 7.3 07/04/2022   ALBUMIN 4.1 07/04/2022   AST 20 07/04/2022   ALT 14 07/04/2022   ALKPHOS 58 07/04/2022   BILITOT 0.5 07/04/2022   GFRNONAA >60 07/04/2022   GFRAA >60 12/01/2019    Lab Results  Component Value Date   CEA1 12.10 (H) 01/03/2021   CEA 12.47 (H) 07/04/2022     Medications: I have reviewed the patient's current medications.   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), transverse colon cancer stage IIB (T4aN0) status post extended right hemicolectomy 05/07/2019, MSS Tumor invasive superficial portion of the muscle ("internal third "), no tumor perforation, no vascular invasion or perineural  invasion, negative resection margins, 0/4 lymph nodes Elevated CEA 02/10/2019 CTs 03/05/2019, compared to outside CTs from 11/22/2018-worsened wall thickening in the cecum stable apple core lesion in the mid transverse colon, no evidence of recurrent tumor at the distal colonic anastomosis, no evidence of metastatic disease Colonoscopy 03/24/2019 20-2 malignant appearing masses noted in the colon, 1 filled the cecum measuring approximately 4 cm across and friable.  The other malignant appearing mass was circumferential creating a stricture with a 1.2 cm lumen located in the transverse segment approximately 4 to 5 cm in length.  There were 12-18 neoplastic appearing polyps in between the 2 obvious malignant masses ranging in size from 3 mm to 2 cm.  5 polyps distal to the transverse colon mass.  2 sigmoid colon polyps.  Cecum mass positive for adenocarcinoma.  Transverse colon mass positive for adenocarcinoma. Foundation 1 transverse colon-MSS, tumor mutation burden 7, KRAS wild-type, BRAF fusion 05/07/2019 laparoscopic extended right hemicolectomy, lysis of adhesions-5 cm invasive moderately differentiated adenocarcinoma involving cecum and proximal ascending colon, carcinoma invades into the pericolonic soft tissue; 7 cm invasive moderately differentiated carcinoma involving the transverse colon, carcinoma invades into the serosal surface (visceral peritoneum); all resection margins negative for carcinoma.  Lymphovascular invasion present.  In the proximal colon metastatic carcinoma involves 3 of 17 lymph nodes with 1 tumor deposit.  In the transverse colon 12 lymph nodes negative for metastatic carcinoma.  3 separate polyps: Tubular adenoma, inflammatory polyp and hyperplastic polyp Cycle 1  capecitabine 06/02/2019 Cycle 2 capecitabine 06/23/2019 Cycle 3 capecitabine 07/14/2019 Cycle 4 capecitabine 08/04/2019 Cycle 5 capecitabine 08/25/2019 Cycle 6 capecitabine 09/15/2019 Cycle 7 capecitabine 10/06/2019 Cycle 8  capecitabine 10/27/2019 Upper endoscopy 07/23/2020-negative Colonoscopy 07/23/2020-normal-appearing anastomoses, polyp removed from the descending colon-tubular adenoma CTs 01/18/2021-right abdominal mesenteric mass, mass in the low anatomic pelvis consistent with metastases Cycle 1 FOLFOX 02/21/2021 Cycle 2 FOLFOX 03/07/2021, dose reductions made due to mild neutropenia Cycle 3 FOLFOX 03/21/2021 Cycle 4 FOLFOX 04/04/2021 Cycle 5 FOLFOX 04/19/2021 Cycle 6 FOLFOX 05/02/2021 CTs 05/12/2021-decrease size of nodular right mesenteric mass, complete resolution of soft tissue enhancing nodule in the right inguinal canal, resolution of nodular soft tissue mass posterior to the bladder, enlargement of a left upper lobe nodule and new 4 mm left upper lobe nodule-indeterminate Cycle 7 FOLFOX 05/16/2021 Cycle 8 FOLFOX 05/30/2021, oxaliplatin and 5-FU bolus held due to neuropathy and thrombocytopenia Cycle 9 FOLFOX 06/13/2021, oxaliplatin dose reduced Cycle 10 FOLFOX 06/27/2021 CT 07/07/2021-decrease in right mesenteric mass, decreased size of lobulated left upper lobe nodule, resolution of previous "new "left upper lobe nodule, new area of wall thickening at the distal transverse colon Treatment changed to Xeloda 2 weeks on/1 week off beginning 07/18/2021 Cycle 2 Xeloda beginning 08/09/2021 Cycle 3 Xeloda beginning 08/30/2021 Cycle 4 Xeloda beginning 09/21/2021 Cycle 5 Xeloda beginning 10/12/2021 Cycle 6 Xeloda beginning 11/02/2021 CTs 11/14/2021-right lower quadrant spiculated small bowel mesenteric mass measured 2.8 x 3.7 cm, previously 1.0 x 2.3 cm; adjacent mesenteric haziness and nodularity measuring up to 8 mm superiorly.  New 4 mm nodular lesion posterior segment right upper lobe along the major fissure. Treatment break beginning 11/17/2021 CTs 03/21/2022-multiple new liver metastases, enlarging implant in the right ileocolic mesentery, enlarging peritoneal implants in the low pelvis Cycle 1 FOLFIRI/Avastin  03/29/2022 Cycle 2 FOLFIRI/Avastin 04/11/2022 Cycle 3 FOLFIRI/Avastin 04/26/2022 Cycle 4 FOLFIRI/Avastin 05/10/2022 Cycle 5 FOLFIRI/Avastin 05/24/2022 CT abdomen/pelvis 06/02/2022-liver metastases are stable and smaller, smaller peritoneal/mesenteric nodules Cycle 6 FOLFIRI/Avastin 06/06/2022 Cycle 7 FOLFIRI/Avastin 06/20/2022 Cycle 8 FOLFIRI/Avastin 07/04/2022 , Cycle 9 FOLFIRI/Avastin 07/18/2022 2.   Microcytic anemia secondary to #1.    Resolved 3.   Family history of pancreas and colon cancer, negative genetic testing per the [employer] panel, ATM variant of unknown significance 4.   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  He continues to tolerate the chemotherapy well.  He completed on cycle of FOLFIRI/Avastin today.  He will return for an office visit and chemotherapy in 2 weeks.  He will be referred for restaging CTs after cycle 10.  Betsy Coder, MD  07/18/2022  9:07 AM

## 2022-07-20 ENCOUNTER — Inpatient Hospital Stay: Payer: Commercial Managed Care - HMO

## 2022-07-24 ENCOUNTER — Inpatient Hospital Stay: Payer: Commercial Managed Care - HMO

## 2022-07-24 ENCOUNTER — Inpatient Hospital Stay: Payer: Commercial Managed Care - HMO | Attending: Oncology

## 2022-07-24 ENCOUNTER — Encounter: Payer: Self-pay | Admitting: Nurse Practitioner

## 2022-07-24 ENCOUNTER — Telehealth: Payer: Self-pay

## 2022-07-24 ENCOUNTER — Inpatient Hospital Stay: Payer: Commercial Managed Care - HMO | Admitting: Nurse Practitioner

## 2022-07-24 VITALS — BP 148/67 | HR 86 | Temp 98.1°F | Resp 18 | Ht 68.0 in | Wt 168.0 lb

## 2022-07-24 VITALS — BP 160/73 | HR 67 | Temp 98.1°F | Resp 17

## 2022-07-24 DIAGNOSIS — C189 Malignant neoplasm of colon, unspecified: Secondary | ICD-10-CM | POA: Diagnosis not present

## 2022-07-24 DIAGNOSIS — Z5189 Encounter for other specified aftercare: Secondary | ICD-10-CM | POA: Insufficient documentation

## 2022-07-24 DIAGNOSIS — Z5111 Encounter for antineoplastic chemotherapy: Secondary | ICD-10-CM | POA: Insufficient documentation

## 2022-07-24 DIAGNOSIS — C787 Secondary malignant neoplasm of liver and intrahepatic bile duct: Secondary | ICD-10-CM | POA: Insufficient documentation

## 2022-07-24 DIAGNOSIS — G620 Drug-induced polyneuropathy: Secondary | ICD-10-CM | POA: Diagnosis not present

## 2022-07-24 DIAGNOSIS — C187 Malignant neoplasm of sigmoid colon: Secondary | ICD-10-CM | POA: Diagnosis not present

## 2022-07-24 DIAGNOSIS — D509 Iron deficiency anemia, unspecified: Secondary | ICD-10-CM | POA: Diagnosis not present

## 2022-07-24 LAB — CBC WITH DIFFERENTIAL (CANCER CENTER ONLY)
Abs Immature Granulocytes: 0.04 10*3/uL (ref 0.00–0.07)
Basophils Absolute: 0 10*3/uL (ref 0.0–0.1)
Basophils Relative: 1 %
Eosinophils Absolute: 0.3 10*3/uL (ref 0.0–0.5)
Eosinophils Relative: 6 %
HCT: 40.6 % (ref 39.0–52.0)
Hemoglobin: 13.5 g/dL (ref 13.0–17.0)
Immature Granulocytes: 1 %
Lymphocytes Relative: 32 %
Lymphs Abs: 1.5 10*3/uL (ref 0.7–4.0)
MCH: 30.3 pg (ref 26.0–34.0)
MCHC: 33.3 g/dL (ref 30.0–36.0)
MCV: 91 fL (ref 80.0–100.0)
Monocytes Absolute: 1 10*3/uL (ref 0.1–1.0)
Monocytes Relative: 20 %
Neutro Abs: 1.9 10*3/uL (ref 1.7–7.7)
Neutrophils Relative %: 40 %
Platelet Count: 218 10*3/uL (ref 150–400)
RBC: 4.46 MIL/uL (ref 4.22–5.81)
RDW: 16.4 % — ABNORMAL HIGH (ref 11.5–15.5)
WBC Count: 4.8 10*3/uL (ref 4.0–10.5)
nRBC: 0 % (ref 0.0–0.2)

## 2022-07-24 LAB — CMP (CANCER CENTER ONLY)
ALT: 12 U/L (ref 0–44)
AST: 18 U/L (ref 15–41)
Albumin: 4.2 g/dL (ref 3.5–5.0)
Alkaline Phosphatase: 70 U/L (ref 38–126)
Anion gap: 9 (ref 5–15)
BUN: 19 mg/dL (ref 8–23)
CO2: 25 mmol/L (ref 22–32)
Calcium: 9.9 mg/dL (ref 8.9–10.3)
Chloride: 105 mmol/L (ref 98–111)
Creatinine: 0.99 mg/dL (ref 0.61–1.24)
GFR, Estimated: >60 mL/min (ref >60)
Glucose, Bld: 109 mg/dL — ABNORMAL HIGH (ref 70–99)
Potassium: 4.2 mmol/L (ref 3.5–5.1)
Sodium: 139 mmol/L (ref 135–145)
Total Bilirubin: 0.4 mg/dL (ref 0.3–1.2)
Total Protein: 7.5 g/dL (ref 6.5–8.1)

## 2022-07-24 LAB — TOTAL PROTEIN, URINE DIPSTICK: Protein, ur: NEGATIVE mg/dL

## 2022-07-24 MED ORDER — ATROPINE SULFATE 1 MG/ML IV SOLN
0.5000 mg | Freq: Once | INTRAVENOUS | Status: AC | PRN
  Administered 2022-07-24: 0.5 mg via INTRAVENOUS
  Filled 2022-07-24: qty 1

## 2022-07-24 MED ORDER — PALONOSETRON HCL INJECTION 0.25 MG/5ML
0.2500 mg | Freq: Once | INTRAVENOUS | Status: AC
  Administered 2022-07-24: 0.25 mg via INTRAVENOUS
  Filled 2022-07-24: qty 5

## 2022-07-24 MED ORDER — FLUOROURACIL CHEMO INJECTION 2.5 GM/50ML
300.0000 mg/m2 | Freq: Once | INTRAVENOUS | Status: AC
  Administered 2022-07-24: 600 mg via INTRAVENOUS
  Filled 2022-07-24: qty 12

## 2022-07-24 MED ORDER — SODIUM CHLORIDE 0.9 % IV SOLN
180.0000 mg/m2 | Freq: Once | INTRAVENOUS | Status: AC
  Administered 2022-07-24: 340 mg via INTRAVENOUS
  Filled 2022-07-24: qty 15

## 2022-07-24 MED ORDER — SODIUM CHLORIDE 0.9 % IV SOLN
300.0000 mg/m2 | Freq: Once | INTRAVENOUS | Status: AC
  Administered 2022-07-24: 580 mg via INTRAVENOUS
  Filled 2022-07-24: qty 29

## 2022-07-24 MED ORDER — SODIUM CHLORIDE 0.9 % IV SOLN: Freq: Once | INTRAVENOUS | Status: AC

## 2022-07-24 MED ORDER — SODIUM CHLORIDE 0.9 % IV SOLN
2000.0000 mg/m2 | INTRAVENOUS | Status: DC
  Administered 2022-07-24: 3850 mg via INTRAVENOUS
  Filled 2022-07-24: qty 77

## 2022-07-24 MED ORDER — SODIUM CHLORIDE 0.9 % IV SOLN
5.0000 mg/kg | Freq: Once | INTRAVENOUS | Status: AC
  Administered 2022-07-24: 400 mg via INTRAVENOUS
  Filled 2022-07-24: qty 16

## 2022-07-24 MED ORDER — SODIUM CHLORIDE 0.9 % IV SOLN
10.0000 mg | Freq: Once | INTRAVENOUS | Status: AC
  Administered 2022-07-24: 10 mg via INTRAVENOUS
  Filled 2022-07-24: qty 10

## 2022-07-24 NOTE — Progress Notes (Signed)
[place] OFFICE PROGRESS NOTE   Diagnosis: Colon cancer  INTERVAL HISTORY:   [name] returns as scheduled.  FOLFIRI/bevacizumab held 07/18/2022 due to mild neutropenia.  No fever or chills.  He denies nausea/vomiting.  No mouth sores.  No diarrhea.  No bleeding except mild occasional hemorrhoidal blood.  He notes soreness at the anterior lower inner gumline.  Throat feels irritated.  His son wonders if this could be due to allergies.  Objective:  Vital signs in last 24 hours:  Blood pressure (!) 148/67, pulse 86, temperature 98.1 F (36.7 C), resp. rate 18, height 5\' 8"  (1.727 m), weight 168 lb (76.2 kg), SpO2 99 %.    HEENT: No thrush or ulcers.  Anterior lower inner gumline appears unremarkable. Resp: Lungs clear bilaterally. Cardio: Regular rate and rhythm. GI: Abdomen soft and nontender.  No hepatosplenomegaly. Vascular: No leg edema. Skin: Palms with hyperpigmentation, no erythema. Port-A-Cath without erythema.  Lab Results:  Lab Results  Component Value Date   WBC 4.8 07/24/2022   HGB 13.5 07/24/2022   HCT 40.6 07/24/2022   MCV 91.0 07/24/2022   PLT 218 07/24/2022   NEUTROABS 1.9 07/24/2022    Imaging:  No results found.  Medications: I have reviewed the patient's current medications.  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), transverse colon cancer stage IIB (T4aN0) status post extended right hemicolectomy 05/07/2019, MSS Tumor invasive superficial portion of the muscle ("internal third "), no tumor perforation, no vascular invasion or perineural invasion, negative resection margins, 0/4 lymph nodes Elevated CEA 02/10/2019 CTs 03/05/2019, compared to outside CTs from 11/22/2018-worsened wall thickening in the cecum stable apple core lesion in the mid transverse colon, no evidence of recurrent tumor at the distal colonic  anastomosis, no evidence of metastatic disease Colonoscopy 03/24/2019 20-2 malignant appearing masses noted in the colon, 1 filled the cecum measuring approximately 4 cm across and friable.  The other malignant appearing mass was circumferential creating a stricture with a 1.2 cm lumen located in the transverse segment approximately 4 to 5 cm in length.  There were 12-18 neoplastic appearing polyps in between the 2 obvious malignant masses ranging in size from 3 mm to 2 cm.  5 polyps distal to the transverse colon mass.  2 sigmoid colon polyps.  Cecum mass positive for adenocarcinoma.  Transverse colon mass positive for adenocarcinoma. Foundation 1 transverse colon-MSS, tumor mutation burden 7, KRAS wild-type, BRAF fusion 05/07/2019 laparoscopic extended right hemicolectomy, lysis of adhesions-5 cm invasive moderately differentiated adenocarcinoma involving cecum and proximal ascending colon, carcinoma invades into the pericolonic soft tissue; 7 cm invasive moderately differentiated carcinoma involving the transverse colon, carcinoma invades into the serosal surface (visceral peritoneum); all resection margins negative for carcinoma.  Lymphovascular invasion present.  In the proximal colon metastatic carcinoma involves 3 of 17 lymph nodes with 1 tumor deposit.  In the transverse colon 12 lymph nodes negative for metastatic carcinoma.  3 separate polyps: Tubular adenoma, inflammatory polyp and hyperplastic polyp Cycle 1 capecitabine 06/02/2019 Cycle 2 capecitabine 06/23/2019 Cycle 3 capecitabine 07/14/2019 Cycle 4 capecitabine 08/04/2019 Cycle 5 capecitabine 08/25/2019 Cycle 6 capecitabine 09/15/2019 Cycle 7 capecitabine 10/06/2019 Cycle 8 capecitabine 10/27/2019 Upper endoscopy 07/23/2020-negative Colonoscopy 07/23/2020-normal-appearing anastomoses, polyp removed from the descending colon-tubular adenoma CTs 01/18/2021-right abdominal mesenteric mass, mass in the low anatomic pelvis consistent with metastases Cycle 1  FOLFOX 02/21/2021 Cycle 2 FOLFOX 03/07/2021, dose reductions made due to mild neutropenia Cycle  3 FOLFOX 03/21/2021 Cycle 4 FOLFOX 04/04/2021 Cycle 5 FOLFOX 04/19/2021 Cycle 6 FOLFOX 05/02/2021 CTs 05/12/2021-decrease size of nodular right mesenteric mass, complete resolution of soft tissue enhancing nodule in the right inguinal canal, resolution of nodular soft tissue mass posterior to the bladder, enlargement of a left upper lobe nodule and new 4 mm left upper lobe nodule-indeterminate Cycle 7 FOLFOX 05/16/2021 Cycle 8 FOLFOX 05/30/2021, oxaliplatin and 5-FU bolus held due to neuropathy and thrombocytopenia Cycle 9 FOLFOX 06/13/2021, oxaliplatin dose reduced Cycle 10 FOLFOX 06/27/2021 CT 07/07/2021-decrease in right mesenteric mass, decreased size of lobulated left upper lobe nodule, resolution of previous "new "left upper lobe nodule, new area of wall thickening at the distal transverse colon Treatment changed to Xeloda 2 weeks on/1 week off beginning 07/18/2021 Cycle 2 Xeloda beginning 08/09/2021 Cycle 3 Xeloda beginning 08/30/2021 Cycle 4 Xeloda beginning 09/21/2021 Cycle 5 Xeloda beginning 10/12/2021 Cycle 6 Xeloda beginning 11/02/2021 CTs 11/14/2021-right lower quadrant spiculated small bowel mesenteric mass measured 2.8 x 3.7 cm, previously 1.0 x 2.3 cm; adjacent mesenteric haziness and nodularity measuring up to 8 mm superiorly.  New 4 mm nodular lesion posterior segment right upper lobe along the major fissure. Treatment break beginning 11/17/2021 CTs 03/21/2022-multiple new liver metastases, enlarging implant in the right ileocolic mesentery, enlarging peritoneal implants in the low pelvis Cycle 1 FOLFIRI/Avastin 03/29/2022 Cycle 2 FOLFIRI/Avastin 04/11/2022 Cycle 3 FOLFIRI/Avastin 04/26/2022 Cycle 4 FOLFIRI/Avastin 05/10/2022 Cycle 5 FOLFIRI/Avastin 05/24/2022 CT abdomen/pelvis 06/02/2022-liver metastases are stable and smaller, smaller peritoneal/mesenteric nodules Cycle 6 FOLFIRI/Avastin  06/06/2022 Cycle 7 FOLFIRI/Avastin 06/20/2022 Cycle 8 FOLFIRI/Avastin 07/04/2022 Treatment held 07/18/2022 due to neutropenia Cycle 9 FOLFIRI/Avastin 07/24/2022, Udenyca 2.   Microcytic anemia secondary to #1.    Resolved 3.   Family history of pancreas and colon cancer, negative genetic testing per the [facility] panel, ATM variant of unknown significance 4.   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 appears stable.  He has completed 8 cycles of FOLFIRI/Avastin.  Treatment was held last week due to neutropenia.  We reviewed the CBC from today.  Counts adequate to proceed with cycle 9 FOLFIRI/Avastin today as scheduled.  He will receive white cell growth factor support on the day of pump discontinuation.  We reviewed potential side effects including bone pain, rash, splenic rupture.  He agrees to proceed.  He will try Zyrtec or Claritin for the throat irritation.  We also sent a prescription for Magic mouthwash to his pharmacy.  He will return for lab, follow-up, chemotherapy in 2 weeks.  Plan for restaging CTs after cycle 10.    Ned Card ANP/GNP-BC   07/24/2022  8:43 AM

## 2022-07-24 NOTE — Progress Notes (Signed)
Patient seen by Lisa Thomas NP today  Vitals are within treatment parameters.  Labs reviewed by Lisa Thomas NP and are within treatment parameters.  Per physician team, patient is ready for treatment and there are NO modifications to the treatment plan.     

## 2022-07-24 NOTE — Patient Instructions (Addendum)
Instrucciones al darle de alta: The chemotherapy medication bag should finish at 46 hours, 96 hours, or 7 days. For example, if your pump is scheduled for 46 hours and it was put on at 4:00 p.m., it should finish at 2:00 p.m. the day it is scheduled to come off regardless of your appointment time.     Estimated time to finish at 10:30, Wednesday, Apri 3,2024.   If the display on your pump reads "Low Volume" and it is beeping, take the batteries out of the pump and come to the cancer center for it to be taken off.   If the pump alarms go off prior to the pump reading "Low Volume" then call [phone] and someone can assist you.  If the plunger comes out and the chemotherapy medication is leaking out, please use your home chemo spill kit to clean up the spill. Do NOT use paper towels or other household products.  If you have problems or questions regarding your pump, please call either [phone] (24 hours a day) or the cancer center Monday-Friday 8:00 a.m.- 4:30 p.m. at the clinic number and we will assist you. If you are unable to get assistance, then go to the nearest Emergency Department and ask the staff to contact the IV team for assistance.  Discharge Instructions Gracias por elegir al [facility] de Cncer de [place] para brindarle atencin mdica de oncologa y Music therapist.   Si usted tiene una cita de laboratorio con [place], por favor vaya directamente [facility] y regstrese en el rea de Control and instrumentation engineer.   Use ropa cmoda y [place] para tener fcil acceso a las vas del Portacath (acceso venoso de Engineer, site duracin) o la lnea PICC (catter central colocado por va perifrica).   Nos esforzamos por ofrecerle tiempo de calidad con su proveedor. Es posible que tenga que volver a programar su cita si llega tarde (15 minutos o ms).  El llegar tarde le afecta a usted y a otros pacientes cuyas citas son posteriores a Merchandiser, retail.  Adems, si usted falta a tres o ms citas sin  avisar a la oficina, puede ser retirado(a) de la clnica a discrecin del proveedor.      Para las solicitudes de renovacin de recetas, pida a su farmacia que se ponga en contacto con nuestra oficina y deje que transcurran 41 horas para que se complete el proceso de las renovaciones.    Hoy usted recibi los siguientes agentes de quimioterapia e/o inmunoterapia Avastin, Irinotecan, Leucovorin, Fluorouracil.      Para ayudar a prevenir las nuseas y los vmitos despus de su tratamiento, le recomendamos que tome su medicamento para las nuseas segn las indicaciones.  LOS SNTOMAS QUE DEBEN COMUNICARSE INMEDIATAMENTE SE INDICAN A CONTINUACIN: *FIEBRE SUPERIOR A 100.4 F (38 C) O MS *ESCALOFROS O SUDORACIN *NUSEAS Y VMITOS QUE NO SE CONTROLAN CON EL MEDICAMENTO PARA LAS NUSEAS *DIFICULTAD INUSUAL PARA RESPIRAR  *MORETONES O HEMORRAGIAS NO HABITUALES *PROBLEMAS URINARIOS (dolor o ardor al Garment/textile technologist o frecuencia para Garment/textile technologist) *PROBLEMAS INTESTINALES (diarrea inusual, estreimiento, dolor cerca del ano) SENSIBILIDAD EN LA BOCA Y EN LA GARGANTA CON O SIN LA PRESENCIA DE LCERAS (dolor de garganta, llagas en la boca o dolor de muelas/dientes) ERUPCIN, HINCHAZN O DOLORES INUSUALES FLUJO VAGINAL INUSUAL O PICAZN/RASQUIA    Los puntos marcados con un asterisco ( *) indican una posible emergencia y debe hacer un seguimiento tan pronto como le sea posible o vaya al Departamento de Emergencias si se le presenta  algn problema.  Por favor, muestre la [place] DE ADVERTENCIA DE Windy Canny DE ADVERTENCIA DE Benay Spice al registrarse en [address] de Emergencias y a la enfermera de triaje.  Si tiene preguntas despus de su visita o necesita cancelar o volver a programar su cita, por favor pngase en contacto con [place]  Dept: [phone]  y [place] instrucciones. Las horas de oficina son de 8:00 a.m. a 4:30 p.m. de lunes a viernes. Por  favor, tenga en cuenta que los mensajes de voz que se dejan despus de las 4:00 p.m. posiblemente no se devolvern hasta el siguiente da de [place].  Cerramos los fines de semana y [employer]. En todo momento tiene acceso a una enfermera para preguntas urgentes. Por favor, llame al nmero principal de la clnica Dept: [phone] y [place] instrucciones.   Para cualquier pregunta que no sea de carcter urgente, tambin puede ponerse en contacto con su proveedor [employer]. Ahora ofrecemos visitas electrnicas para cualquier persona mayor de 18 aos que solicite atencin mdica en lnea para los sntomas que no sean urgentes. Para ms detalles vaya a [email or web address].   Tambin puede bajar la aplicacin de MyChart! Vaya a la tienda de aplicaciones, busque "MyChart", abra la aplicacin, seleccione [place], e ingrese con su nombre de usuario y la contrasea de Pharmacist, community.  Bevacizumab Injection Qu es este medicamento? El BEVACIZUMAB trata algunos tipos de cncer. Acta bloqueando una protena que hace que las clulas cancerosas crezcan y se multipliquen. Esto ayuda a Music therapist propagacin de las clulas cancerosas. Es un anticuerpo monoclonal. Cindra Presume medicamento puede ser utilizado para otros usos; si tiene alguna pregunta consulte con su proveedor de atencin mdica o con su farmacutico. MARCAS COMUNES: Alymsys, Avastin, MVASI, Jacob Moores le debo informar a mi profesional de la salud antes de tomar este medicamento? Necesitan saber si usted presenta alguno de los siguientes problemas o situaciones: Cogulos sanguneos Tos con sangre Ciruga programada o reciente Insuficiencia cardiaca Presin arterial alta Antecedentes de una conexin entre 2 o ms partes del cuerpo que por lo general no estn conectadas (fstula) Antecedentes de un desgarro en el estmago o los intestinos Protena en la orina Una reaccin alrgica o inusual al bevacizumab,  a otros medicamentos, alimentos, colorantes o conservantes Si est embarazada o buscando quedar embarazada Si est amamantando a un beb Cmo debo utilizar este medicamento? Este medicamento se inyecta en una vena. Su equipo de atencin lo [employer] en un hospital o en un entorno clnico. Hable con su equipo de atencin sobre el uso de este medicamento en nios. Puede requerir atencin especial. Sobredosis: Pngase en contacto inmediatamente con un centro toxicolgico o una sala de urgencia si usted cree que haya tomado demasiado medicamento. ATENCIN: [employer] es solo para usted. No comparta este medicamento con nadie. Qu sucede si me olvido de una dosis? Cumpla con las citas para dosis de seguimiento. Es importante no olvidar ninguna dosis. Llame a su equipo de atencin si no puede asistir a una cita. Qu puede interactuar con este medicamento? No se anticipan interacciones. Puede ser que esta lista no menciona todas las posibles interacciones. Informe a su profesional de KB Home	[place] de [employer] Corporation productos a base de hierbas, medicamentos de [place] o suplementos nutritivos que est tomando. Si usted fuma, consume bebidas alcohlicas o si utiliza drogas ilegales, indqueselo tambin a su profesional de KB Home	[place]. Algunas sustancias pueden interactuar con su medicamento. A qu  debo estar atento al usar este medicamento? Se supervisar su estado de salud atentamente mientras reciba este medicamento. Usted podra necesitar realizarse [employer] de sangre mientras est usando [place]. Este medicamento podra hacerle sentir un Nurse, mental health. Esto no es inusual, ya que la quimioterapia puede afectar tanto a las clulas sanas como a las clulas cancerosas. Si presenta algn efecto secundario, infrmelo. Contine con el tratamiento incluso si se siente enfermo, a menos que su equipo de [employer] lo suspenda. Este medicamento podra aumentar el riesgo de moretones o  sangrado. Llame a su equipo de atencin si observa sangrados inusuales. Antes de realizarse una ciruga, hable con su equipo de atencin para asegurarse de que no hay ningn problema. Este medicamento puede aumentar el riesgo de que el sitio o la herida quirrgica no sanen correctamente. Tendr que dejar de usar este medicamento durante [address] antes de la [place]. Despus de la [place], espere al menos [address] antes de reiniciar el uso de [place]. Asegrese de que el sitio o la herida quirrgica haya sanado lo suficiente antes de Retail buyer el uso del medicamento. Hable con su equipo de atencin si tiene alguna pregunta. Hable con su equipo de atencin si podra estar embarazada. Este medicamento puede causar defectos congnitos graves si se [place] durante el embarazo y por 6 meses despus de la ltima dosis. Se recomienda utilizar un mtodo anticonceptivo mientras est usando este medicamento y por 6 meses despus de la ltima dosis. Su equipo de atencin mdica puede ayudarle a Pension scheme manager la opcin que mejor se adapte a sus necesidades. No debe amamantar a un beb mientras [place] este medicamento y por 6 meses despus de la ltima dosis. Este medicamento puede causar infertilidad. Hable con su equipo de atencin si le preocupa su fertilidad. Qu efectos secundarios puedo tener al [employer] este medicamento? Efectos secundarios que debe informar a su equipo de atencin tan pronto como sea posible: Reacciones alrgicas: erupcin cutnea, comezn/picazn, urticaria, hinchazn de la cara, los labios, la lengua o la garganta Sangrado: heces con [place], o de color negro y Curator alquitranado, vomitar sangre o material marrn que tiene el aspecto de posos (residuos) de caf, [place] de color rojo o marrn oscuro, pequeas manchas rojas o moradas en la piel, sangrado o moretones inusuales Cogulo sanguneo: dolor, hinchazn, calor en una pierna, falta de aire, dolor en el pecho Ataque cardiaco: dolor u opresin  en el pecho, los hombros, los brazos o la [place], nuseas, falta de [place], piel fra o sudorosa, sensacin de [place] o aturdimiento Insuficiencia cardiaca: falta de aire, hinchazn de los tobillos, los pies o las manos, aumento de peso repentino, debilidad o fatiga inusuales Aumento de la presin arterial Infeccin: fiebre, escalofros, tos, dolor de garganta, heridas que no sanan, dolor o problemas para Garment/textile technologist, sensacin general de molestia o IT sales professional a la infusin: Tourist information centre manager, falta de aire o dificultad para respirar, sensacin de desmayo o aturdimiento [employer] riones: disminucin en la cantidad de orina, hinchazn de los tobillos, las manos o los pies Dolor de Paramedic intenso que no desaparece o Ambulance person Accidente cerebrovascular: entumecimiento o debilidad repentinos de la cara, un brazo o una pierna, dificultad para hablar, confusin, dificultad para caminar, prdida de equilibrio o coordinacin, mareos, dolor de cabeza intenso, cambio en la visin Dolor de cabeza repentino e intenso, confusin, cambio en la visin, convulsiones, que podran ser signos de sndrome de encefalopata posterior reversible Efectos secundarios que generalmente no requieren atencin mdica (debe  informarlos a su equipo de atencin si persisten o si son molestos): Dolor de Engineer, manufacturing sentido del gusto Diarrea Piel seca Aumento de lgrimas Sangrado por la nariz Puede ser que esta lista no menciona todos los posibles efectos secundarios. Comunquese a su mdico por asesoramiento mdico [employer] Inc. Usted puede informar los efectos secundarios a la FDA por telfono al 1-800-FDA-1088. Dnde debo guardar mi medicina? Este medicamento se administra en hospitales o clnicas. No se guarda en su casa. ATENCIN: Este folleto es un resumen. Puede ser que no cubra toda la posible informacin. Si usted tiene preguntas acerca de esta medicina, consulte con su mdico, su  farmacutico o su profesional de Technical sales engineer.  2023 Elsevier/Gold Standard (2022-03-01 00:00:00) Irinotecan Injection Qu es este medicamento? El IRINOTECN trata algunos tipos de cncer. Acta desacelerando el crecimiento de clulas cancerosas. Este medicamento puede ser utilizado para otros usos; si tiene alguna pregunta consulte con su proveedor de atencin mdica o con su farmacutico. MARCAS COMUNES: Camptosar Qu le debo informar a mi profesional de la salud antes de tomar este medicamento? Necesitan saber si usted presenta alguno de los [employer] o situaciones: Deshidratacin Diarrea Infeccin, especialmente una infeccin viral, tales como varicela, fuegos labiales, herpes Enfermedad heptica Niveles bajos de clulas sanguneas (glbulos blancos, glbulos rojos y plaquetas) Niveles bajos de Brewing technologist, tales como calcio, magnesio o potasio en la sangre Radiacin en curso o reciente Una reaccin alrgica o inusual al irinotecn, a otros medicamentos, alimentos, colorantes o conservantes Si usted o su pareja est embarazada o intentando quedar embarazada Si est amamantando a un beb Cmo debo Insurance account manager medicamento? Este medicamento se inyecta en una vena. Su equipo de atencin lo [employer] en un hospital o en un entorno clnico. Hable con su equipo de atencin sobre el uso de este medicamento en nios. Puede requerir atencin especial. Sobredosis: Pngase en contacto inmediatamente con un centro toxicolgico o una sala de urgencia si usted cree que haya tomado demasiado medicamento. ATENCIN: [employer] Foods es solo para usted. No comparta este medicamento con nadie. Qu sucede si me olvido de una dosis? Cumpla con las citas para dosis de seguimiento. Es importante no olvidar ninguna dosis. Llame a su equipo de atencin si no puede asistir a una cita. Qu puede interactuar con este medicamento? No use este medicamento con ninguno de los siguientes  productos: Cobicistat Itraconazol Este medicamento tambin podra interactuar con los siguientes productos: Ciertos antibiticos, tales como claritromicina, rifampicina, rifabutina Ciertos medicamentos antivirales para el VIH o SIDA Ciertos medicamentos para las infecciones micticas, tales como ketoconazol, posaconazol, voriconazol Ciertos medicamentos para convulsiones, tales como [place], fenobarbital, fenitona Gemfibrozil Nefazodona Hierba de [place] Puede ser que esta lista no menciona todas las posibles interacciones. Informe a su profesional de KB Home	[place] de [employer] Corporation productos a base de hierbas, medicamentos de [place] o suplementos nutritivos que est tomando. Si usted fuma, consume bebidas alcohlicas o si utiliza drogas ilegales, indqueselo tambin a su profesional de KB Home	[place]. Algunas sustancias pueden interactuar con su medicamento. A qu debo estar atento al usar [employer]a? Se supervisar su estado de salud atentamente mientras reciba este medicamento. Usted podra necesitar realizarse [employer] de sangre mientras est usando [place]. Este medicamento podra hacerle sentir un Nurse, mental health. Esto no es inusual, ya que la quimioterapia puede afectar tanto a las clulas sanas como a las clulas cancerosas. Si presenta algn efecto secundario, infrmelo. Contine con el tratamiento incluso si se siente enfermo, a menos que su  equipo de [employer] lo suspenda. Este medicamento puede causar efectos secundarios graves. Para reducir Catering manager, su equipo de atencin puede darle otros medicamentos que deber usar antes de Health and safety inspector. Asegrese de seguir las instrucciones de su equipo de atencin. Este medicamento podra afectar su coordinacin, tiempo de reaccin o juicio. No conduzca ni opere maquinaria pesada hasta que sepa cmo le afecta este medicamento. Pngase de pie o levntese lentamente para reducir el riesgo de mareos o [place]. [place]  alcohol con Coca-Cola puede aumentar el riesgo de estos efectos secundarios. Este medicamento puede aumentar su riesgo de contraer una infeccin. Llame para pedir consejo a su equipo de atencin si tiene fiebre, escalofros, dolor de garganta o cualquier otro sntoma de resfriado o gripe. No se trate usted mismo. Trate de no acercarse a personas que estn enfermas. Evite usar medicamentos que contengan aspirina, acetaminofeno, ibuprofeno, naproxeno o ketoprofeno, a menos que as lo indique su equipo de atencin. Estos medicamentos pueden ocultar la fiebre. Este medicamento podra aumentar el riesgo de moretones o sangrado. Llame a su equipo de atencin si observa sangrados inusuales. Proceda con cuidado al cepillar sus dientes, usar hilo dental o Risk manager palillos para los dientes, ya que podra contraer una infeccin o Therapist, art con mayor facilidad. Si recibe algn tratamiento dental, informe a su dentista que est [employer]. Informe a su equipo de atencin si usted o su pareja est embarazada o si cree que alguna de las dos podra estar embarazada. Este medicamento puede causar defectos congnitos graves si se [place] durante el embarazo y por 6 meses despus de la ltima dosis. Deber realizarse una prueba de embarazo y obtener resultado negativo antes de Medical laboratory scientific officer a Solicitor. Se recomienda utilizar un mtodo anticonceptivo mientras est usando este medicamento y por 6 meses despus de la ltima dosis. Su equipo de atencin mdica puede ayudarle a Pension scheme manager la opcin que mejor se adapte a sus necesidades. No embarace a Social research officer, government est usando este medicamento y durante 3 meses despus de la ltima dosis. Use un condn como anticonceptivo [employer]. No debe amamantar a un beb mientras [place] este medicamento y por 7 das despus de la ltima dosis. Este medicamento podra causar infertilidad. Hable con su equipo de atencin si le preocupa su fertilidad. Qu  efectos secundarios puedo tener al [employer] este medicamento? Efectos secundarios que debe informar a su equipo de atencin tan pronto como sea posible: Reacciones alrgicas: erupcin cutnea, comezn/picazn, urticaria, hinchazn de la cara, los labios, la lengua o la garganta Tos seca, falta de aire o problemas para respirar Aumento de saliva o lgrimas, aumento de la sudoracin, calambres estomacales, diarrea, pupilas pequeas, debilidad o fatiga inusuales, frecuencia cardiaca lenta Infeccin: fiebre, escalofros, tos, dolor de garganta, heridas que no sanan, dolor o problemas para Garment/textile technologist, sensacin general de molestia o malestar [employer] riones: disminucin en la cantidad de orina, hinchazn de los tobillos, las manos o los pies Recuento bajo de glbulos rojos: debilidad o fatiga inusuales, Chief of Staff, Social research officer, government de cabeza, dificultad para respirar Diarrea grave o prolongada Sangrado o moretones inusuales Efectos secundarios que generalmente no requieren atencin mdica (debe informarlos a su equipo de atencin si persisten o si son molestos): Estreimiento Diarrea Cada del cabello Prdida del apetito Nuseas Dolor estomacal Puede ser que esta lista no menciona todos los posibles efectos secundarios. Comunquese a su mdico por asesoramiento mdico [employer]. Usted puede informar los efectos secundarios a la FDA por telfono al  1-800-FDA-1088. Dnde debo guardar mi medicina? Este medicamento se administra en hospitales o clnicas. No se guarda en su casa. ATENCIN: Este folleto es un resumen. Puede ser que no cubra toda la posible informacin. Si usted tiene preguntas acerca de esta medicina, consulte con su mdico, su farmacutico o su profesional de Technical sales engineer.  2023 Elsevier/Gold Standard (2022-03-01 00:00:00) Leucovorin Injection What is this medication? LEUCOVORIN (loo koe VOR in) prevents side effects from certain medications, such as methotrexate. It works by  increasing folate levels. This helps protect healthy cells in your body. It may also be used to treat anemia caused by low levels of folate. It can also be used with fluorouracil, a type of chemotherapy, to treat colorectal cancer. It works by increasing the effects of fluorouracil in the body. This medicine may be used for other purposes; ask your health care provider or pharmacist if you have questions. What should I tell my care team before I take this medication? They need to know if you have any of these conditions: Anemia from low levels of vitamin B12 in the blood An unusual or allergic reaction to leucovorin, folic acid, other medications, foods, dyes, or preservatives Pregnant or trying to get pregnant Breastfeeding How should I use this medication? This medication is injected into a vein or a muscle. It is given by your care team in a hospital or clinic setting. Talk to your care team about the use of this medication in children. Special care may be needed. Overdosage: If you think you have taken too much of this medicine contact a poison control center or emergency room at once. NOTE: This medicine is only for you. Do not share this medicine with others. What if I miss a dose? Keep appointments for follow-up doses. It is important not to miss your dose. Call your care team if you are unable to keep an appointment. What may interact with this medication? Capecitabine Fluorouracil Phenobarbital Phenytoin Primidone Trimethoprim;sulfamethoxazole This list may not describe all possible interactions. Give your health care provider a list of all the medicines, herbs, non-prescription drugs, or dietary supplements you use. Also tell them if you smoke, drink alcohol, or use illegal drugs. Some items may interact with your medicine. What should I watch for while using this medication? Your condition will be monitored carefully while you are receiving this medication. This medication may  increase the side effects of 5-fluorouracil. Tell your care team if you have diarrhea or mouth sores that do not get better or that get worse. What side effects may I notice from receiving this medication? Side effects that you should report to your care team as soon as possible: Allergic reactions--skin rash, itching, hives, swelling of the face, lips, tongue, or throat This list may not describe all possible side effects. Call your doctor for medical advice about side effects. You may report side effects to FDA at 1-800-FDA-1088. Where should I keep my medication? This medication is given in a hospital or clinic. It will not be stored at home. NOTE: This sheet is a summary. It may not cover all possible information. If you have questions about this medicine, talk to your doctor, pharmacist, or health care provider.  2023 Elsevier/Gold Standard (2021-08-19 00:00:00) Fluorouracil Injection Qu es este medicamento? El FLUOROURACILO trata algunos tipos de cncer. Acta desacelerando el crecimiento de clulas cancerosas. Este medicamento puede ser utilizado para otros usos; si tiene alguna pregunta consulte con su proveedor de atencin mdica o con su farmacutico. MARCAS COMUNES: [place]  Qu le debo informar a mi profesional de la salud antes de tomar este medicamento? Necesitan saber si usted presenta alguno de los siguientes problemas o situaciones: Trastornos sanguneos Deficiencia de dihidropirimidina deshidrogenasa (DPD) Infecciones, tales como varicela, fuegos labiales, herpes Enfermedad renal Enfermedad heptica Desnutricin Terapia de radiacin en curso o reciente [place] reaccin alrgica o inusual al fluorouracilo, a otros medicamentos, alimentos, colorantes o conservantes Si usted o su pareja est embarazada o intentando quedar embarazada Si est amamantando a un beb Cmo debo Insurance account manager medicamento? Este medicamento se inyecta en una vena. Su equipo de atencin lo [employer]  en un hospital o en un entorno clnico. Hable con su equipo de atencin sobre el uso de este medicamento en nios. Puede requerir atencin especial. Sobredosis: Pngase en contacto inmediatamente con un centro toxicolgico o una sala de urgencia si usted cree que haya tomado demasiado medicamento. ATENCIN: [employer] es solo para usted. No comparta este medicamento con nadie. Qu sucede si me olvido de una dosis? Cumpla con las citas para dosis de seguimiento. Es importante no olvidar ninguna dosis. Llame a su equipo de atencin si no puede asistir a una cita. Qu puede interactuar con este medicamento? No use este medicamento con ninguno de los siguientes productos: Clinical biochemist de virus vivos Este medicamento tambin podra Counselling psychologist con los siguientes productos: Medicamentos que tratan o previenen cogulos sanguneos, tales como warfarina, enoxaparina, dalteparina Puede ser que esta lista no menciona todas las posibles interacciones. Informe a su profesional de [employer]	[place] de [employer] productos a base de hierbas, medicamentos de [place] o suplementos nutritivos que est tomando. Si usted fuma, consume bebidas alcohlicas o si utiliza drogas ilegales, indqueselo tambin a su profesional de [employer]	[place]. Algunas sustancias pueden interactuar con su medicamento. A qu debo estar atento al usar [employer]? Se supervisar su estado de salud atentamente mientras reciba este medicamento. Este medicamento podra hacerle sentir un Nurse, mental health. Esto no es inusual, ya que la quimioterapia puede afectar tanto a las clulas sanas como a las clulas cancerosas. Si presenta algn efecto secundario, infrmelo. Contine con el tratamiento incluso si se siente enfermo, a menos que su equipo de [employer] lo suspenda. En algunos casos, podra recibir [employer] para ayudarle con los efectos secundarios. Siga todas las instrucciones para usarlos. Este medicamento puede  aumentar su riesgo de contraer una infeccin. Llame para pedir consejo a su equipo de atencin si tiene fiebre, escalofros, dolor de garganta o cualquier otro sntoma de resfriado o gripe. No se trate usted mismo. Trate de no acercarse a personas que estn enfermas. Este medicamento podra aumentar el riesgo de moretones o sangrado. Llame a su equipo de atencin si observa sangrados inusuales. Proceda con cuidado al cepillar sus dientes, usar hilo dental o Risk manager palillos para los dientes, ya que podra contraer una infeccin o Therapist, art con mayor facilidad. Si recibe algn tratamiento dental, informe a su dentista que est [employer]. Evite usar medicamentos que contengan aspirina, acetaminofeno, ibuprofeno, naproxeno o ketoprofeno, a menos que as lo indique su equipo de atencin. Estos medicamentos pueden ocultar la fiebre. No trate la diarrea con productos de [employer]. Contacte a su equipo de atencin si tiene diarrea por ms de 2 das, o si es grave y [place]. Este medicamento puede aumentar su sensibilidad al sol. Evite la [employer]. Si no la Product manager, utilice ropa protectora y crema de Photographer. No utilice lmparas solares, camas solares ni cabinas solares. Informe a su equipo  de atencin si usted o su pareja est buscando un embarazo o si cree que podra estar embarazada. Este medicamento puede causar defectos congnitos graves si se [place] durante el embarazo y por 3 meses despus de la ltima dosis. Se recomienda Risk manager un mtodo anticonceptivo confiable mientras est usando este medicamento y durante 3 meses despus de la ltima dosis. Hable con su equipo de atencin sobre mtodos anticonceptivos eficaces. No embarace a Social research officer, government est usando este medicamento y durante 3 meses despus de la ltima dosis. Use un condn durante las relaciones sexuales que mantenga en [place]. No debe amamantar a un beb mientras est [employer]. Este medicamento  podra causar infertilidad. Hable con su equipo de atencin si le preocupa su fertilidad. Qu efectos secundarios puedo tener al [employer] este medicamento? Efectos secundarios que debe informar a su equipo de atencin tan pronto como sea posible: Reacciones alrgicas: erupcin cutnea, comezn/picazn, urticaria, hinchazn de la cara, los labios, la lengua o la garganta Ataque cardiaco: Social research officer, government u opresin en el pecho, los hombros, los brazos o la [place], nuseas, falta de [place], piel fra o sudorosa, sensacin de [place] o aturdimiento Insuficiencia cardiaca: falta de aire, hinchazn de los tobillos, los pies o las manos, aumento de peso repentino, debilidad o fatiga inusuales Cambios en el ritmo cardiaco: frecuencia cardiaca rpida o irregular, mareos, sensacin de desmayo o aturdimiento, dolor en el pecho, dificultad para respirar Niveles elevados de amonaco: debilidad o fatiga inusuales, confusin, prdida de apetito, nuseas, vmitos, convulsiones Infeccin: fiebre, escalofros, tos, dolor de garganta, heridas que no sanan, dolor o problemas para Garment/textile technologist, sensacin general de molestia o malestar Recuento bajo de glbulos rojos: debilidad o fatiga inusuales, mareo, dolor de cabeza, dificultad para Contractor, hormigueo o entumecimiento en las manos o los pies, debilidad muscular, cambios en la visin, confusin o dificultad para hablar, prdida del equilibrio o la coordinacin, dificultad para caminar, convulsiones Enrojecimiento, hinchazn y [employer] en la piel de las manos y los pies Diarrea grave o prolongada Sangrado o moretones inusuales Efectos secundarios que generalmente no requieren atencin mdica (debe informarlos a su equipo de atencin si persisten o si son molestos): Piel seca Dolor de cabeza Aumento de Museum/gallery curator, enrojecimiento o hinchazn con llagas dentro de la boca o la garganta Sensibilidad a la luz [place] Puede ser que esta lista no menciona todos los  posibles efectos secundarios. Comunquese a su mdico por asesoramiento mdico [employer]. Usted puede informar los efectos secundarios a la FDA por telfono al 1-800-FDA-1088. Dnde debo guardar mi medicina? Este medicamento se administra en hospitales o clnicas. No se guarda en su casa. ATENCIN: Este folleto es un resumen. Puede ser que no cubra toda la posible informacin. Si usted tiene preguntas acerca de esta medicina, consulte con su mdico, su farmacutico o su profesional de Technical sales engineer.  2023 Elsevier/Gold Standard (2022-03-01 00:00:00)

## 2022-07-24 NOTE — Progress Notes (Signed)
John Vance from [facility] at chairside for flush and infusion until 1000 interpreting for patient.

## 2022-07-24 NOTE — Telephone Encounter (Signed)
According to John Vance, the patient requires a prescription for a special mouthwash. I have submitted a new prescription for this mouthwash to the [facility] in [place].

## 2022-07-25 ENCOUNTER — Other Ambulatory Visit: Payer: Self-pay

## 2022-07-26 ENCOUNTER — Inpatient Hospital Stay: Payer: Commercial Managed Care - HMO

## 2022-07-26 VITALS — BP 141/77 | HR 66 | Temp 98.2°F | Resp 18

## 2022-07-26 DIAGNOSIS — Z5111 Encounter for antineoplastic chemotherapy: Secondary | ICD-10-CM | POA: Diagnosis not present

## 2022-07-26 DIAGNOSIS — C189 Malignant neoplasm of colon, unspecified: Secondary | ICD-10-CM

## 2022-07-26 MED ORDER — HEPARIN SOD (PORK) LOCK FLUSH 100 UNIT/ML IV SOLN
500.0000 [IU] | Freq: Once | INTRAVENOUS | Status: AC | PRN
  Administered 2022-07-26: 500 [IU]

## 2022-07-26 MED ORDER — PEGFILGRASTIM-CBQV 6 MG/0.6ML ~~LOC~~ SOSY
6.0000 mg | PREFILLED_SYRINGE | Freq: Once | SUBCUTANEOUS | Status: AC
  Administered 2022-07-26: 6 mg via SUBCUTANEOUS
  Filled 2022-07-26: qty 0.6

## 2022-07-26 MED ORDER — SODIUM CHLORIDE 0.9% FLUSH
10.0000 mL | INTRAVENOUS | Status: DC | PRN
  Administered 2022-07-26: 10 mL

## 2022-07-26 NOTE — Progress Notes (Signed)
Patient was assisted by Berta Minor today for his pump stop appointment. Verbal and written education provided on Udenyca injection today.

## 2022-07-26 NOTE — Patient Instructions (Signed)
Pegfilgrastim Injection Qu es este medicamento? El PEGFILGRASTIM disminuye el riesgo de infeccin en personas que estn recibiendo quimioterapia. Acta ayudando al cuerpo a producir ms glbulos blancos, que protegen al cuerpo contra las infecciones. Tambin podra usarse para ayudar a las personas que han estado expuestas a dosis elevadas de radiacin. Este medicamento puede ser utilizado para otros usos; si tiene alguna pregunta consulte con su proveedor de atencin mdica o con su farmacutico. MARCAS COMUNES: Fulphila, Fylnetra, Neulasta, Nyvepria, Stimufend, UDENYCA, Ziextenzo Qu le debo informar a mi profesional de la salud antes de tomar este medicamento? Necesitan saber si usted presenta alguno de los siguientes problemas o situaciones: Enfermedad renal Alergia al ltex Terapia de radiacin en curso Enfermedad de clulas falciformes Reacciones en la piel a los adhesivos acrlicos (inyector en el cuerpo solamente) Una reaccin alrgica o inusual al pegfilgrastim, al filgrastim, a otros medicamentos, alimentos, colorantes o conservantes Si est embarazada o buscando quedar embarazada Si est amamantando a un beb Cmo debo utilizar este medicamento? Este medicamento se inyecta debajo de la piel. Si recibe este medicamento en su casa, le ensearn cmo preparar y administrar la jeringa prellenada o cmo usar el inyector en el cuerpo. Consulte las instrucciones de uso para el paciente para obtener instrucciones detalladas. Use el medicamento exactamente como se le indique. Informe de inmediato a su equipo de atencin si sospecha que su inyector en el cuerpo puede haber funcionado incorrectamente o si sospecha que el uso de su inyector en el cuerpo hizo que usted recibiera una dosis parcial o no recibiera una dosis. Es importante que deseche las agujas y las jeringas usadas en un recipiente resistente a los pinchazos. No las deseche en la basura. Si no tiene un recipiente resistente a los  pinchazos, llame a su farmacutico o a su equipo de atencin para obtenerlo. Hable con su equipo de atencin sobre el uso de este medicamento en nios. Aunque este medicamento se puede recetar para ciertas afecciones, existen precauciones que deben tomarse. Sobredosis: Pngase en contacto inmediatamente con un centro toxicolgico o una sala de urgencia si usted cree que haya tomado demasiado medicamento. ATENCIN: Este medicamento es solo para usted. No comparta este medicamento con nadie. Qu sucede si me olvido de una dosis? Es importante no olvidar ninguna dosis. Llame a su equipo de atencin si olvid una dosis. Si olvida una dosis debido a una falla o fuga del inyector en el cuerpo, se deber administrar una nueva dosis tan pronto como sea posible usando una nica jeringa prellenada para uso manual. Qu puede interactuar con este medicamento? No se han estudiado las interacciones. Puede ser que esta lista no menciona todas las posibles interacciones. Informe a su profesional de la salud de todos los productos a base de hierbas, medicamentos de venta libre o suplementos nutritivos que est tomando. Si usted fuma, consume bebidas alcohlicas o si utiliza drogas ilegales, indqueselo tambin a su profesional de la salud. Algunas sustancias pueden interactuar con su medicamento. A qu debo estar atento al usar este medicamento? Se supervisar su estado de salud atentamente mientras reciba este medicamento. Usted podra necesitar realizarse anlisis de sangre mientras est usando este medicamento. Hable con su equipo de atencin sobre su riesgo de cncer. Usted podra tener mayor riesgo de desarrollar ciertos tipos de cncer si usa este medicamento. Si va a someterse a una IRM (MRI), tomografa computarizada u otro procedimiento, informe a su equipo de atencin que est usando este medicamento (inyector en el cuerpo solamente). Qu efectos secundarios puedo tener   al utilizar este  medicamento? Efectos secundarios que debe informar a su equipo de atencin tan pronto como sea posible: Reacciones alrgicas: erupcin cutnea, comezn/picazn, urticaria, hinchazn de la cara, los labios, la lengua o la garganta [place] de fuga capilar: dolor estomacal o muscular, debilidad o fatiga inusuales, sensacin de desmayo o aturdimiento, disminucin de la cantidad de orina, hinchazn de los tobillos, las manos o los pies, dificultad respiratoria Nivel elevado de glbulos blancos: fiebre, fatiga, dificultad respiratoria, sudoracin nocturna, cambio en la visin, prdida de peso Inflamacin de la aorta: fiebre, fatiga, dolor de espalda, pecho o estmago, dolor de cabeza intenso Lesin renal (glomerulonefritis): disminucin de la cantidad de orina, orina roja o marrn oscura, orina con espuma o burbujas, hinchazn de los tobillos, las manos o los pies Falta de aire o problemas para respirar Lesin en el bazo: dolor en la parte superior izquierda del abdomen u hombro Sangrado o moretones inusuales Efectos secundarios que generalmente no requieren atencin mdica (debe informarlos a su equipo de atencin si persisten o si son molestos): Dolor de huesos Dolor en las manos o los pies Puede ser que esta lista no menciona todos los posibles efectos secundarios. Comunquese a su mdico por asesoramiento mdico sobre los efectos secundarios. Usted puede informar los efectos secundarios a la FDA por telfono al 1-800-FDA-1088. Dnde debo guardar mi medicina? Mantenga fuera del alcance de los nios. Si est usando este medicamento en su casa, le indicarn cmo guardarlo. Deseche todo el medicamento que no haya utilizado despus de la fecha de vencimiento indicada en la etiqueta. ATENCIN: Este folleto es un resumen. Puede ser que no cubra toda la posible informacin. Si usted tiene preguntas acerca de esta medicina, consulte con su mdico, su farmacutico o su profesional de la salud.  2023  Elsevier/Gold Standard (2021-03-02 00:00:00)  

## 2022-08-01 ENCOUNTER — Inpatient Hospital Stay: Payer: Commercial Managed Care - HMO

## 2022-08-01 ENCOUNTER — Inpatient Hospital Stay: Payer: Commercial Managed Care - HMO | Admitting: Oncology

## 2022-08-01 ENCOUNTER — Ambulatory Visit: Payer: Commercial Managed Care - HMO

## 2022-08-01 ENCOUNTER — Other Ambulatory Visit: Payer: Commercial Managed Care - HMO

## 2022-08-01 ENCOUNTER — Ambulatory Visit: Payer: Commercial Managed Care - HMO | Admitting: Nurse Practitioner

## 2022-08-03 ENCOUNTER — Inpatient Hospital Stay: Payer: Commercial Managed Care - HMO

## 2022-08-06 ENCOUNTER — Other Ambulatory Visit: Payer: Self-pay | Admitting: Oncology

## 2022-08-08 ENCOUNTER — Inpatient Hospital Stay: Payer: Commercial Managed Care - HMO

## 2022-08-08 ENCOUNTER — Inpatient Hospital Stay: Payer: Commercial Managed Care - HMO | Admitting: Oncology

## 2022-08-08 ENCOUNTER — Encounter: Payer: Self-pay | Admitting: *Deleted

## 2022-08-08 VITALS — BP 150/79 | HR 66 | Resp 18

## 2022-08-08 VITALS — BP 144/64 | HR 83 | Temp 98.1°F | Resp 18 | Ht 68.0 in | Wt 170.0 lb

## 2022-08-08 DIAGNOSIS — C189 Malignant neoplasm of colon, unspecified: Secondary | ICD-10-CM

## 2022-08-08 DIAGNOSIS — Z5111 Encounter for antineoplastic chemotherapy: Secondary | ICD-10-CM | POA: Diagnosis not present

## 2022-08-08 LAB — CBC WITH DIFFERENTIAL (CANCER CENTER ONLY)
Abs Immature Granulocytes: 0.07 10*3/uL (ref 0.00–0.07)
Basophils Absolute: 0 10*3/uL (ref 0.0–0.1)
Basophils Relative: 1 %
Eosinophils Absolute: 0.2 10*3/uL (ref 0.0–0.5)
Eosinophils Relative: 3 %
HCT: 41.7 % (ref 39.0–52.0)
Hemoglobin: 13.7 g/dL (ref 13.0–17.0)
Immature Granulocytes: 1 %
Lymphocytes Relative: 18 %
Lymphs Abs: 1.4 10*3/uL (ref 0.7–4.0)
MCH: 30.2 pg (ref 26.0–34.0)
MCHC: 32.9 g/dL (ref 30.0–36.0)
MCV: 91.9 fL (ref 80.0–100.0)
Monocytes Absolute: 0.9 10*3/uL (ref 0.1–1.0)
Monocytes Relative: 11 %
Neutro Abs: 5.4 10*3/uL (ref 1.7–7.7)
Neutrophils Relative %: 66 %
Platelet Count: 173 10*3/uL (ref 150–400)
RBC: 4.54 MIL/uL (ref 4.22–5.81)
RDW: 17.2 % — ABNORMAL HIGH (ref 11.5–15.5)
WBC Count: 8 10*3/uL (ref 4.0–10.5)
nRBC: 0 % (ref 0.0–0.2)

## 2022-08-08 LAB — CMP (CANCER CENTER ONLY)
ALT: 14 U/L (ref 0–44)
AST: 20 U/L (ref 15–41)
Albumin: 4.1 g/dL (ref 3.5–5.0)
Alkaline Phosphatase: 83 U/L (ref 38–126)
Anion gap: 8 (ref 5–15)
BUN: 19 mg/dL (ref 8–23)
CO2: 23 mmol/L (ref 22–32)
Calcium: 9.4 mg/dL (ref 8.9–10.3)
Chloride: 106 mmol/L (ref 98–111)
Creatinine: 0.93 mg/dL (ref 0.61–1.24)
GFR, Estimated: >60 mL/min (ref >60)
Glucose, Bld: 128 mg/dL — ABNORMAL HIGH (ref 70–99)
Potassium: 4.1 mmol/L (ref 3.5–5.1)
Sodium: 137 mmol/L (ref 135–145)
Total Bilirubin: 0.3 mg/dL (ref 0.3–1.2)
Total Protein: 6.5 g/dL (ref 6.5–8.1)

## 2022-08-08 LAB — TOTAL PROTEIN, URINE DIPSTICK: Protein, ur: NEGATIVE mg/dL

## 2022-08-08 LAB — CEA (ACCESS): CEA (CHCC): 15.97 ng/mL — ABNORMAL HIGH (ref 0.00–5.00)

## 2022-08-08 MED ORDER — SODIUM CHLORIDE 0.9 % IV SOLN
5.0000 mg/kg | Freq: Once | INTRAVENOUS | Status: AC
  Administered 2022-08-08: 400 mg via INTRAVENOUS
  Filled 2022-08-08: qty 16

## 2022-08-08 MED ORDER — SODIUM CHLORIDE 0.9 % IV SOLN: Freq: Once | INTRAVENOUS | Status: AC

## 2022-08-08 MED ORDER — ATROPINE SULFATE 1 MG/ML IV SOLN
0.5000 mg | Freq: Once | INTRAVENOUS | Status: AC | PRN
  Administered 2022-08-08: 0.5 mg via INTRAVENOUS
  Filled 2022-08-08: qty 1

## 2022-08-08 MED ORDER — FLUOROURACIL CHEMO INJECTION 2.5 GM/50ML
300.0000 mg/m2 | Freq: Once | INTRAVENOUS | Status: AC
  Administered 2022-08-08: 600 mg via INTRAVENOUS
  Filled 2022-08-08: qty 12

## 2022-08-08 MED ORDER — PALONOSETRON HCL INJECTION 0.25 MG/5ML
0.2500 mg | Freq: Once | INTRAVENOUS | Status: AC
  Administered 2022-08-08: 0.25 mg via INTRAVENOUS
  Filled 2022-08-08: qty 5

## 2022-08-08 MED ORDER — SODIUM CHLORIDE 0.9 % IV SOLN
2000.0000 mg/m2 | INTRAVENOUS | Status: DC
  Administered 2022-08-08: 3850 mg via INTRAVENOUS
  Filled 2022-08-08: qty 77

## 2022-08-08 MED ORDER — SODIUM CHLORIDE 0.9 % IV SOLN
180.0000 mg/m2 | Freq: Once | INTRAVENOUS | Status: AC
  Administered 2022-08-08: 340 mg via INTRAVENOUS
  Filled 2022-08-08: qty 15

## 2022-08-08 MED ORDER — SODIUM CHLORIDE 0.9 % IV SOLN
10.0000 mg | Freq: Once | INTRAVENOUS | Status: AC
  Administered 2022-08-08: 10 mg via INTRAVENOUS
  Filled 2022-08-08: qty 10

## 2022-08-08 MED ORDER — SODIUM CHLORIDE 0.9 % IV SOLN
300.0000 mg/m2 | Freq: Once | INTRAVENOUS | Status: AC
  Administered 2022-08-08: 580 mg via INTRAVENOUS
  Filled 2022-08-08: qty 29

## 2022-08-08 NOTE — Progress Notes (Signed)
Visit today assisted by spanish interpreter, [facility] w/[facility].

## 2022-08-08 NOTE — Progress Notes (Signed)
[facility] OFFICE PROGRESS NOTE   Diagnosis: Colon cancer  INTERVAL HISTORY:   Mr Nonie Hoyer completed another cycle of FOLFIRI/bevacizumab on 07/24/2022.  He received G-CSF on 07/26/2022.  No bone pain.  No nausea/vomiting, mouth sores, diarrhea, bleeding, or symptom of thrombosis.  He has persistent cold sensitivity in the fingers.  No other complaint.  Objective:  Vital signs in last 24 hours:  Blood pressure (!) 144/64, pulse 83, temperature 98.1 F (36.7 C), temperature source Oral, resp. rate 18, height 5\' 8"  (1.727 m), weight 170 lb (77.1 kg), SpO2 99 %.    HEENT: No thrush or ulcers Resp: Lungs clear bilaterally Cardio: Regular rate and rhythm GI: No hepatosplenomegaly Vascular: No leg edema  Skin: Mild hyperpigmentation and skin thickening of the hands  Portacath/PICC-without erythema  Lab Results:  Lab Results  Component Value Date   WBC 8.0 08/08/2022   HGB 13.7 08/08/2022   HCT 41.7 08/08/2022   MCV 91.9 08/08/2022   PLT 173 08/08/2022   NEUTROABS 5.4 08/08/2022    CMP  Lab Results  Component Value Date   NA 137 08/08/2022   K 4.1 08/08/2022   CL 106 08/08/2022   CO2 23 08/08/2022   GLUCOSE 128 (H) 08/08/2022   BUN 19 08/08/2022   CREATININE 0.93 08/08/2022   CALCIUM 9.4 08/08/2022   PROT 6.5 08/08/2022   ALBUMIN 4.1 08/08/2022   AST 20 08/08/2022   ALT 14 08/08/2022   ALKPHOS 83 08/08/2022   BILITOT 0.3 08/08/2022   GFRNONAA >60 08/08/2022   GFRAA >60 12/01/2019    Lab Results  Component Value Date   CEA1 12.10 (H) 01/03/2021   CEA 15.97 (H) 08/08/2022     Medications: I have reviewed the patient's current medications.   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), transverse colon cancer stage IIB (T4aN0) status post extended right hemicolectomy 05/07/2019, MSS Tumor invasive superficial portion of the  muscle ("internal third "), no tumor perforation, no vascular invasion or perineural invasion, negative resection margins, 0/4 lymph nodes Elevated CEA 02/10/2019 CTs 03/05/2019, compared to outside CTs from 11/22/2018-worsened wall thickening in the cecum stable apple core lesion in the mid transverse colon, no evidence of recurrent tumor at the distal colonic anastomosis, no evidence of metastatic disease Colonoscopy 03/24/2019 20-2 malignant appearing masses noted in the colon, 1 filled the cecum measuring approximately 4 cm across and friable.  The other malignant appearing mass was circumferential creating a stricture with a 1.2 cm lumen located in the transverse segment approximately 4 to 5 cm in length.  There were 12-18 neoplastic appearing polyps in between the 2 obvious malignant masses ranging in size from 3 mm to 2 cm.  5 polyps distal to the transverse colon mass.  2 sigmoid colon polyps.  Cecum mass positive for adenocarcinoma.  Transverse colon mass positive for adenocarcinoma. Foundation 1 transverse colon-MSS, tumor mutation burden 7, KRAS wild-type, BRAF fusion 05/07/2019 laparoscopic extended right hemicolectomy, lysis of adhesions-5 cm invasive moderately differentiated adenocarcinoma involving cecum and proximal ascending colon, carcinoma invades into the pericolonic soft tissue; 7 cm invasive moderately differentiated carcinoma involving the transverse colon, carcinoma invades into the serosal surface (visceral peritoneum); all resection margins negative for carcinoma.  Lymphovascular invasion present.  In the proximal colon metastatic carcinoma involves 3 of 17 lymph nodes with 1 tumor deposit.  In the transverse colon 12 lymph nodes negative for metastatic carcinoma.  3 separate polyps: Tubular adenoma, inflammatory polyp and hyperplastic polyp Cycle 1 capecitabine 06/02/2019 Cycle 2 capecitabine 06/23/2019 Cycle 3 capecitabine 07/14/2019 Cycle 4 capecitabine 08/04/2019 Cycle 5 capecitabine  08/25/2019 Cycle 6 capecitabine 09/15/2019 Cycle 7 capecitabine 10/06/2019 Cycle 8 capecitabine 10/27/2019 Upper endoscopy 07/23/2020-negative Colonoscopy 07/23/2020-normal-appearing anastomoses, polyp removed from the descending colon-tubular adenoma CTs 01/18/2021-right abdominal mesenteric mass, mass in the low anatomic pelvis consistent with metastases Cycle 1 FOLFOX 02/21/2021 Cycle 2 FOLFOX 03/07/2021, dose reductions made due to mild neutropenia Cycle 3 FOLFOX 03/21/2021 Cycle 4 FOLFOX 04/04/2021 Cycle 5 FOLFOX 04/19/2021 Cycle 6 FOLFOX 05/02/2021 CTs 05/12/2021-decrease size of nodular right mesenteric mass, complete resolution of soft tissue enhancing nodule in the right inguinal canal, resolution of nodular soft tissue mass posterior to the bladder, enlargement of a left upper lobe nodule and new 4 mm left upper lobe nodule-indeterminate Cycle 7 FOLFOX 05/16/2021 Cycle 8 FOLFOX 05/30/2021, oxaliplatin and 5-FU bolus held due to neuropathy and thrombocytopenia Cycle 9 FOLFOX 06/13/2021, oxaliplatin dose reduced Cycle 10 FOLFOX 06/27/2021 CT 07/07/2021-decrease in right mesenteric mass, decreased size of lobulated left upper lobe nodule, resolution of previous "new "left upper lobe nodule, new area of wall thickening at the distal transverse colon Treatment changed to Xeloda 2 weeks on/1 week off beginning 07/18/2021 Cycle 2 Xeloda beginning 08/09/2021 Cycle 3 Xeloda beginning 08/30/2021 Cycle 4 Xeloda beginning 09/21/2021 Cycle 5 Xeloda beginning 10/12/2021 Cycle 6 Xeloda beginning 11/02/2021 CTs 11/14/2021-right lower quadrant spiculated small bowel mesenteric mass measured 2.8 x 3.7 cm, previously 1.0 x 2.3 cm; adjacent mesenteric haziness and nodularity measuring up to 8 mm superiorly.  New 4 mm nodular lesion posterior segment right upper lobe along the major fissure. Treatment break beginning 11/17/2021 CTs 03/21/2022-multiple new liver metastases, enlarging implant in the right ileocolic mesentery,  enlarging peritoneal implants in the low pelvis Cycle 1 FOLFIRI/Avastin 03/29/2022 Cycle 2 FOLFIRI/Avastin 04/11/2022 Cycle 3 FOLFIRI/Avastin 04/26/2022 Cycle 4 FOLFIRI/Avastin 05/10/2022 Cycle 5 FOLFIRI/Avastin 05/24/2022 CT abdomen/pelvis 06/02/2022-liver metastases are stable and smaller, smaller peritoneal/mesenteric nodules Cycle 6 FOLFIRI/Avastin 06/06/2022 Cycle 7 FOLFIRI/Avastin 06/20/2022 Cycle 8 FOLFIRI/Avastin 07/04/2022 Treatment held 07/18/2022 due to neutropenia Cycle 9 FOLFIRI/Avastin 07/24/2022, Udenyca Cycle 10 FOLFIRI/Avastin 08/08/2022, Udenyca 2.   Microcytic anemia secondary to #1.    Resolved 3.   Family history of pancreas and colon cancer, negative genetic testing per the [facility] panel, ATM variant of unknown significance 4.   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  He continues to tolerate the FOLFIRI/Avastin well.  The CEA is slightly higher.  He will complete cycle 10 FOLFIRI/bevacizumab today.  He will undergo a restaging CT evaluation prior to an office visit in 2 weeks.  Thornton Papas, MD  08/08/2022  9:38 AM

## 2022-08-08 NOTE — Patient Instructions (Signed)
Instrucciones al darle de alta: Discharge Instructions Gracias por elegir al [facility] de Cncer de [place] para brindarle atencin mdica de oncologa y Music therapist.   Si usted tiene una cita de laboratorio con [place], por favor vaya directamente [place] y regstrese en el rea de Control and instrumentation engineer.   Use ropa cmoda y [place] para tener fcil acceso a las vas del Portacath (acceso venoso de Engineer, site duracin) o la lnea PICC (catter central colocado por va perifrica).   Nos esforzamos por ofrecerle tiempo de calidad con su proveedor. Es posible que tenga que volver a programar su cita si llega tarde (15 minutos o ms).  El llegar tarde le afecta a usted y a otros pacientes cuyas citas son posteriores a Merchandiser, retail.  Adems, si usted falta a tres o ms citas sin avisar a la oficina, puede ser retirado(a) de la clnica a discrecin del proveedor.      Para las solicitudes de renovacin de recetas, pida a su farmacia que se ponga en contacto con nuestra oficina y deje que transcurran 34 horas para que se complete el proceso de las renovaciones.    Hoy usted recibi los siguientes agentes de quimioterapia e/o inmunoterapia Bevacizumab-awwb (MVASI), Irinotecan (CAMPTOSAR), Leucovorin & Flourouracil (ADRUCIL).      Para ayudar a prevenir las nuseas y los vmitos despus de su tratamiento, le recomendamos que tome su medicamento para las nuseas segn las indicaciones.  LOS SNTOMAS QUE DEBEN COMUNICARSE INMEDIATAMENTE SE INDICAN A CONTINUACIN: *FIEBRE SUPERIOR A 100.4 F (38 C) O MS *ESCALOFROS O SUDORACIN *NUSEAS Y VMITOS QUE NO SE CONTROLAN CON EL MEDICAMENTO PARA LAS NUSEAS *DIFICULTAD INUSUAL PARA RESPIRAR  *MORETONES O HEMORRAGIAS NO HABITUALES *PROBLEMAS URINARIOS (dolor o ardor al Garment/textile technologist o frecuencia para Garment/textile technologist) *PROBLEMAS INTESTINALES (diarrea inusual, estreimiento, dolor cerca del ano) SENSIBILIDAD EN LA BOCA Y EN LA GARGANTA CON O SIN LA PRESENCIA DE LCERAS  (dolor de garganta, llagas en la boca o dolor de muelas/dientes) ERUPCIN, HINCHAZN O DOLORES INUSUALES FLUJO VAGINAL INUSUAL O PICAZN/RASQUIA    Los puntos marcados con un asterisco ( *) indican una posible emergencia y debe hacer un seguimiento tan pronto como le sea posible o vaya al Departamento de Emergencias si se le presenta algn problema.  Por favor, muestre la [place] DE ADVERTENCIA DE Windy Canny DE ADVERTENCIA DE Benay Spice al registrarse en [address] de Emergencias y a la enfermera de triaje.  Si tiene preguntas despus de su visita o necesita cancelar o volver a programar su cita, por favor pngase en contacto con [place]  Dept: [phone]  y [place] instrucciones. Las horas de oficina son de 8:00 a.m. a 4:30 p.m. de lunes a viernes. Por favor, tenga en cuenta que los mensajes de voz que se dejan despus de las 4:00 p.m. posiblemente no se devolvern hasta el siguiente da de [place].  Cerramos los fines de semana y [employer]. En todo momento tiene acceso a una enfermera para preguntas urgentes. Por favor, llame al nmero principal de la clnica Dept: [phone] y [place] instrucciones.   Para cualquier pregunta que no sea de carcter urgente, tambin puede ponerse en contacto con su proveedor [employer]. Ahora ofrecemos visitas electrnicas para cualquier persona mayor de 18 aos que solicite atencin mdica en lnea para los sntomas que no sean urgentes. Para ms detalles vaya a [email or web address].   Tambin puede bajar la aplicacin de MyChart! Vaya a la  tienda de aplicaciones, busque "MyChart", abra la aplicacin, seleccione [place], e ingrese con su nombre de usuario y la contrasea de Pharmacist, community.  Bevacizumab Injection Qu es este medicamento? El BEVACIZUMAB trata algunos tipos de cncer. Acta bloqueando una protena que hace que las clulas cancerosas crezcan y se  multipliquen. Esto ayuda a Music therapist propagacin de las clulas cancerosas. Es un anticuerpo monoclonal. Cindra Presume medicamento puede ser utilizado para otros usos; si tiene alguna pregunta consulte con su proveedor de atencin mdica o con su farmacutico. MARCAS COMUNES: Alymsys, Avastin, MVASI, Jacob Moores le debo informar a mi profesional de la salud antes de tomar este medicamento? Necesitan saber si usted presenta alguno de los siguientes problemas o situaciones: Cogulos sanguneos Tos con sangre Ciruga programada o reciente Insuficiencia cardiaca Presin arterial alta Antecedentes de una conexin entre 2 o ms partes del cuerpo que por lo general no estn conectadas (fstula) Antecedentes de un desgarro en el estmago o los intestinos Protena en la orina Una reaccin alrgica o inusual al bevacizumab, a otros medicamentos, alimentos, colorantes o conservantes Si est embarazada o buscando quedar embarazada Si est amamantando a un beb Cmo debo utilizar este medicamento? Este medicamento se inyecta en una vena. Su equipo de atencin lo [employer] en un hospital o en un entorno clnico. Hable con su equipo de atencin sobre el uso de este medicamento en nios. Puede requerir atencin especial. Sobredosis: Pngase en contacto inmediatamente con un centro toxicolgico o una sala de urgencia si usted cree que haya tomado demasiado medicamento. ATENCIN: [employer] es solo para usted. No comparta este medicamento con nadie. Qu sucede si me olvido de una dosis? Cumpla con las citas para dosis de seguimiento. Es importante no olvidar ninguna dosis. Llame a su equipo de atencin si no puede asistir a una cita. Qu puede interactuar con este medicamento? No se anticipan interacciones. Puede ser que esta lista no menciona todas las posibles interacciones. Informe a su profesional de KB Home	[place] de [employer] Corporation productos a base de hierbas, medicamentos de [place] o  suplementos nutritivos que est tomando. Si usted fuma, consume bebidas alcohlicas o si utiliza drogas ilegales, indqueselo tambin a su profesional de KB Home	[place]. Algunas sustancias pueden interactuar con su medicamento. A qu debo estar atento al usar Coca-Cola? Se supervisar su estado de salud atentamente mientras reciba este medicamento. Usted podra necesitar realizarse [employer] de sangre mientras est usando [place]. Este medicamento podra hacerle sentir un Nurse, mental health. Esto no es inusual, ya que la quimioterapia puede afectar tanto a las clulas sanas como a las clulas cancerosas. Si presenta algn efecto secundario, infrmelo. Contine con el tratamiento incluso si se siente enfermo, a menos que su equipo de [employer] lo suspenda. Este medicamento podra aumentar el riesgo de moretones o sangrado. Llame a su equipo de atencin si observa sangrados inusuales. Antes de realizarse una ciruga, hable con su equipo de atencin para asegurarse de que no hay ningn problema. Este medicamento puede aumentar el riesgo de que el sitio o la herida quirrgica no sanen correctamente. Tendr que dejar de usar este medicamento durante [address] antes de la [place]. Despus de la [place], espere al menos [address] antes de reiniciar el uso de [place]. Asegrese de que el sitio o la herida quirrgica haya sanado lo suficiente antes de Retail buyer el uso del medicamento. Hable con su equipo de atencin si tiene alguna pregunta. Hable con su equipo de atencin si podra estar embarazada. [employer]  medicamento puede causar defectos congnitos graves si se [place] durante el embarazo y por 6 meses despus de la ltima dosis. Se recomienda utilizar un mtodo anticonceptivo mientras est usando este medicamento y por 6 meses despus de la ltima dosis. Su equipo de atencin mdica puede ayudarle a Pension scheme manager la opcin que mejor se adapte a sus necesidades. No debe amamantar a un beb mientras  [place] este medicamento y por 6 meses despus de la ltima dosis. Este medicamento puede causar infertilidad. Hable con su equipo de atencin si le preocupa su fertilidad. Qu efectos secundarios puedo tener al [employer] este medicamento? Efectos secundarios que debe informar a su equipo de atencin tan pronto como sea posible: Reacciones alrgicas: erupcin cutnea, comezn/picazn, urticaria, hinchazn de la cara, los labios, la lengua o la garganta Sangrado: heces con [place], o de color negro y Curator alquitranado, vomitar sangre o material marrn que tiene el aspecto de posos (residuos) de caf, [place] de color rojo o marrn oscuro, pequeas manchas rojas o moradas en la piel, sangrado o moretones inusuales Cogulo sanguneo: dolor, hinchazn, calor en una pierna, falta de aire, dolor en el pecho Ataque cardiaco: dolor u opresin en el pecho, los hombros, los brazos o la [employer], nuseas, falta de [place], piel fra o sudorosa, sensacin de [place] o aturdimiento Insuficiencia cardiaca: falta de aire, hinchazn de los tobillos, los pies o las manos, aumento de peso repentino, debilidad o fatiga inusuales Aumento de la presin arterial Infeccin: fiebre, escalofros, tos, dolor de garganta, heridas que no sanan, dolor o problemas para Garment/textile technologist, sensacin general de molestia o IT sales professional a la infusin: Tourist information centre manager, falta de aire o dificultad para respirar, sensacin de desmayo o aturdimiento [employer] riones: disminucin en la cantidad de orina, hinchazn de los tobillos, las manos o los pies Dolor de estmago intenso que no desaparece o Ambulance person Accidente cerebrovascular: entumecimiento o debilidad repentinos de la cara, un brazo o una pierna, dificultad para hablar, confusin, dificultad para caminar, prdida de equilibrio o coordinacin, mareos, dolor de cabeza intenso, cambio en la visin Dolor de cabeza repentino e intenso, confusin, cambio en la visin, convulsiones, que podran  ser signos de sndrome de encefalopata posterior reversible Efectos secundarios que generalmente no requieren atencin mdica (debe informarlos a su equipo de atencin si persisten o si son molestos): Dolor de Sports coach en el sentido del gusto Diarrea Piel seca Aumento de lgrimas Sangrado por la nariz Puede ser que esta lista no menciona todos los posibles efectos secundarios. Comunquese a su mdico por asesoramiento mdico [employer] Inc. Usted puede informar los efectos secundarios a la FDA por telfono al 1-800-FDA-1088. Dnde debo guardar mi medicina? Este medicamento se administra en hospitales o clnicas. No se guarda en su casa. ATENCIN: Este folleto es un resumen. Puede ser que no cubra toda la posible informacin. Si usted tiene preguntas acerca de esta medicina, consulte con su mdico, su farmacutico o su profesional de Technical sales engineer.  2023 Elsevier/Gold Standard (2022-03-01 00:00:00)  Irinotecan Injection Qu es este medicamento? El IRINOTECN trata algunos tipos de cncer. Acta desacelerando el crecimiento de clulas cancerosas. Este medicamento puede ser utilizado para otros usos; si tiene alguna pregunta consulte con su proveedor de atencin mdica o con su farmacutico. MARCAS COMUNES: Camptosar Qu le debo informar a mi profesional de la salud antes de tomar este medicamento? Necesitan saber si usted presenta alguno de los [employer] International o situaciones: Deshidratacin Diarrea Infeccin, especialmente una infeccin viral, tales como varicela, fuegos labiales,  herpes Enfermedad heptica Niveles bajos de clulas sanguneas (glbulos blancos, glbulos rojos y plaquetas) Niveles bajos de Brewing technologist, tales como calcio, magnesio o potasio en la sangre Radiacin en curso o reciente Una reaccin alrgica o inusual al irinotecn, a otros medicamentos, alimentos, colorantes o conservantes Si usted o su pareja est embarazada o intentando quedar  embarazada Si est amamantando a un beb Cmo debo Insurance account manager medicamento? Este medicamento se inyecta en una vena. Su equipo de atencin lo [employer] en un hospital o en un entorno clnico. Hable con su equipo de atencin sobre el uso de este medicamento en nios. Puede requerir atencin especial. Sobredosis: Pngase en contacto inmediatamente con un centro toxicolgico o una sala de urgencia si usted cree que haya tomado demasiado medicamento. ATENCIN: [employer] es solo para usted. No comparta este medicamento con nadie. Qu sucede si me olvido de una dosis? Cumpla con las citas para dosis de seguimiento. Es importante no olvidar ninguna dosis. Llame a su equipo de atencin si no puede asistir a una cita. Qu puede interactuar con este medicamento? No use este medicamento con ninguno de los siguientes productos: Cobicistat Itraconazol Este medicamento tambin podra interactuar con los siguientes productos: Ciertos antibiticos, tales como claritromicina, rifampicina, rifabutina Ciertos medicamentos antivirales para el VIH o SIDA Ciertos medicamentos para las infecciones micticas, tales como ketoconazol, posaconazol, voriconazol Ciertos medicamentos para convulsiones, tales como [place], fenobarbital, fenitona Gemfibrozil Nefazodona Hierba de [place] Puede ser que esta lista no menciona todas las posibles interacciones. Informe a su profesional de KB Home	[place] de [employer] Corporation productos a base de hierbas, medicamentos de [place] o suplementos nutritivos que est tomando. Si usted fuma, consume bebidas alcohlicas o si utiliza drogas ilegales, indqueselo tambin a su profesional de KB Home	[place]. Algunas sustancias pueden interactuar con su medicamento. A qu debo estar atento al usar Coca-Cola? Se supervisar su estado de salud atentamente mientras reciba este medicamento. Usted podra necesitar realizarse [employer]. [employer] de sangre mientras est usando [place]. Este medicamento podra hacerle sentir un Nurse, mental health. Esto no es inusual, ya que la quimioterapia puede afectar tanto a las clulas sanas como a las clulas cancerosas. Si presenta algn efecto secundario, infrmelo. Contine con el tratamiento incluso si se siente enfermo, a menos que su equipo de [employer] lo suspenda. Este medicamento puede causar efectos secundarios graves. Para reducir Catering manager, su equipo de atencin puede darle otros medicamentos que deber usar antes de Health and safety inspector. Asegrese de seguir las instrucciones de su equipo de atencin. Este medicamento podra afectar su coordinacin, tiempo de reaccin o juicio. No conduzca ni opere maquinaria pesada hasta que sepa cmo le afecta este medicamento. Pngase de pie o levntese lentamente para reducir el riesgo de mareos o [place]. Beber alcohol con Coca-Cola puede aumentar el riesgo de estos efectos secundarios. Este medicamento puede aumentar su riesgo de contraer una infeccin. Llame para pedir consejo a su equipo de atencin si tiene fiebre, escalofros, dolor de garganta o cualquier otro sntoma de resfriado o gripe. No se trate usted mismo. Trate de no acercarse a personas que estn enfermas. Evite usar medicamentos que contengan aspirina, acetaminofeno, ibuprofeno, naproxeno o ketoprofeno, a menos que as lo indique su equipo de atencin. Estos medicamentos pueden ocultar la fiebre. Este medicamento podra aumentar el riesgo de moretones o sangrado. Llame a su equipo de atencin si observa sangrados inusuales. Proceda con cuidado al cepillar sus dientes, usar hilo dental o Risk manager palillos para los dientes, ya que podra contraer [place]  infeccin o sangrar con mayor facilidad. Si recibe algn tratamiento dental, informe a su dentista que est [employer]. Informe a su equipo de atencin si usted o su pareja est embarazada o si cree que alguna de las dos podra estar embarazada. Este  medicamento puede causar defectos congnitos graves si se [place] durante el embarazo y por 6 meses despus de la ltima dosis. Deber realizarse una prueba de embarazo y obtener resultado negativo antes de Medical laboratory scientific officer a Solicitor. Se recomienda utilizar un mtodo anticonceptivo mientras est usando este medicamento y por 6 meses despus de la ltima dosis. Su equipo de atencin mdica puede ayudarle a Pension scheme manager la opcin que mejor se adapte a sus necesidades. No embarace a Social research officer, government est usando este medicamento y durante 3 meses despus de la ltima dosis. Use un condn como anticonceptivo [employer]. No debe amamantar a un beb mientras [place] este medicamento y por 7 das despus de la ltima dosis. Este medicamento podra causar infertilidad. Hable con su equipo de atencin si le preocupa su fertilidad. Qu efectos secundarios puedo tener al [employer] este medicamento? Efectos secundarios que debe informar a su equipo de atencin tan pronto como sea posible: Reacciones alrgicas: erupcin cutnea, comezn/picazn, urticaria, hinchazn de la cara, los labios, la lengua o la garganta Tos seca, falta de aire o problemas para respirar Aumento de saliva o lgrimas, aumento de la sudoracin, calambres estomacales, diarrea, pupilas pequeas, debilidad o fatiga inusuales, frecuencia cardiaca lenta Infeccin: fiebre, escalofros, tos, dolor de garganta, heridas que no sanan, dolor o problemas para Garment/textile technologist, sensacin general de molestia o malestar [employer] riones: disminucin en la cantidad de orina, hinchazn de los tobillos, las manos o los pies Recuento bajo de glbulos rojos: debilidad o fatiga inusuales, Chief of Staff, Social research officer, government de cabeza, dificultad para respirar Diarrea grave o prolongada Sangrado o moretones inusuales Efectos secundarios que generalmente no requieren atencin mdica (debe informarlos a su equipo de atencin si persisten o si son molestos): Estreimiento Diarrea Cada  del cabello Prdida del apetito Nuseas Dolor estomacal Puede ser que esta lista no menciona todos los posibles efectos secundarios. Comunquese a su mdico por asesoramiento mdico [employer] Inc. Usted puede informar los efectos secundarios a la FDA por telfono al 1-800-FDA-1088. Dnde debo guardar mi medicina? Este medicamento se administra en hospitales o clnicas. No se guarda en su casa. ATENCIN: Este folleto es un resumen. Puede ser que no cubra toda la posible informacin. Si usted tiene preguntas acerca de esta medicina, consulte con su mdico, su farmacutico o su profesional de Technical sales engineer.  2023 Elsevier/Gold Standard (2022-03-01 00:00:00)  Leucovorin Injection Qu es este medicamento? La LEUCOVORINA previene los efectos secundarios de ciertos medicamentos, Interior and spatial designer. Acta [employer] niveles de folato. Esto ayuda a proteger las clulas sanas del cuerpo. Tambin podra usarse para tratar la anemia causada por niveles bajos de folato. Tambin se puede Risk manager con fluorouracilo, un tipo de quimioterapia, para Dance movement psychotherapist. Acta [employer] del fluorouracilo en el cuerpo. Este medicamento puede ser utilizado para otros usos; si tiene alguna pregunta consulte con su proveedor de atencin mdica o con su farmacutico. Qu le debo informar a mi profesional de la salud antes de tomar este medicamento? Necesitan saber si usted presenta alguno de los WESCO International o situaciones: Anemia por niveles bajos de vitamina B12 en la sangre Una reaccin alrgica o inusual a la leucovorina, al cido flico, a otros medicamentos, alimentos, colorantes o conservantes Si est  embarazada o buscando quedar embarazada Si est amamantando a un beb Cmo debo utilizar este medicamento? Este medicamento se inyecta en una vena o un msculo. Su equipo de atencin lo [employer] en un hospital o en un entorno clnico. Hable con su equipo de  atencin sobre el uso de este medicamento en nios. Puede requerir atencin especial. Sobredosis: Pngase en contacto inmediatamente con un centro toxicolgico o una sala de urgencia si usted cree que haya tomado demasiado medicamento. ATENCIN: [employer] es solo para usted. No comparta este medicamento con nadie. Qu sucede si me olvido de una dosis? Cumpla con las citas para dosis de seguimiento. Es importante no olvidar ninguna dosis. Llame a su equipo de atencin si no puede asistir a una cita. Qu puede interactuar con este medicamento? Capecitabina Fluorouracilo Fenobarbital Fenitona Primidona Trimetoprima;sulfametoxasol Puede ser que esta lista no menciona todas las posibles interacciones. Informe a su profesional de KB Home	[place] de [employer] Corporation productos a base de hierbas, medicamentos de [place] o suplementos nutritivos que est tomando. Si usted fuma, consume bebidas alcohlicas o si utiliza drogas ilegales, indqueselo tambin a su profesional de KB Home	[place]. Algunas sustancias pueden interactuar con su medicamento. A qu debo estar atento al usar Coca-Cola? Se supervisar su estado de salud atentamente mientras reciba este medicamento. Este medicamento podra aumentar los efectos secundarios del 5-fluorouracilo. Informe a su equipo de atencin si tiene diarrea o llagas en la boca que no mejoran o empeoran. Qu efectos secundarios puedo tener al [employer] este medicamento? Efectos secundarios que debe informar a su equipo de atencin tan pronto como sea posible: Reacciones alrgicas: erupcin cutnea, comezn/picazn, urticaria, hinchazn de la cara, los labios, la lengua o la garganta Puede ser que esta lista no menciona todos los posibles efectos secundarios. Comunquese a su mdico por asesoramiento mdico [employer] Inc. Usted puede informar los efectos secundarios a la FDA por telfono al 1-800-FDA-1088. Dnde debo guardar mi medicina? Este medicamento  se administra en hospitales o clnicas. No se guarda en su casa. ATENCIN: Este folleto es un resumen. Puede ser que no cubra toda la posible informacin. Si usted tiene preguntas acerca de esta medicina, consulte con su mdico, su farmacutico o su profesional de Technical sales engineer.  2023 Elsevier/Gold Standard (2022-03-01 00:00:00)  Fluorouracil Injection Qu es este medicamento? El FLUOROURACILO trata algunos tipos de cncer. Acta desacelerando el crecimiento de clulas cancerosas. Este medicamento puede ser utilizado para otros usos; si tiene alguna pregunta consulte con su proveedor de atencin mdica o con su farmacutico. MARCAS COMUNES: Adrucil Qu le debo informar a mi profesional de la salud antes de tomar este medicamento? Necesitan saber si usted presenta alguno de los siguientes problemas o situaciones: Trastornos sanguneos Deficiencia de dihidropirimidina deshidrogenasa (DPD) Infecciones, tales como varicela, fuegos labiales, herpes Enfermedad renal Enfermedad heptica Desnutricin [place] de radiacin en curso o reciente [place] reaccin alrgica o inusual al fluorouracilo, a otros medicamentos, alimentos, colorantes o conservantes Si usted o su pareja est embarazada o intentando quedar embarazada Si est amamantando a un beb Cmo debo Insurance account manager medicamento? Este medicamento se inyecta en una vena. Su equipo de atencin lo [employer] en un hospital o en un entorno clnico. Hable con su equipo de atencin sobre el uso de este medicamento en nios. Puede requerir atencin especial. Sobredosis: Pngase en contacto inmediatamente con un centro toxicolgico o una sala de urgencia si usted cree que haya tomado demasiado medicamento. ATENCIN: [employer] es solo para usted. No comparta este medicamento con nadie. Sander Nephew  sucede si me olvido de una dosis? Cumpla con las citas para dosis de seguimiento. Es importante no olvidar ninguna dosis. Llame a su equipo de atencin si no  puede asistir a una cita. Qu puede interactuar con este medicamento? No use este medicamento con ninguno de los siguientes productos: Clinical biochemist de virus vivos Este medicamento tambin podra Counselling psychologist con los siguientes productos: Medicamentos que tratan o previenen cogulos sanguneos, tales como warfarina, enoxaparina, dalteparina Puede ser que esta lista no menciona todas las posibles interacciones. Informe a su profesional de KB Home	[place] de [employer] productos a base de hierbas, medicamentos de [place] o suplementos nutritivos que est tomando. Si usted fuma, consume bebidas alcohlicas o si utiliza drogas ilegales, indqueselo tambin a su profesional de KB Home	[place]. Algunas sustancias pueden interactuar con su medicamento. A qu debo estar atento al usar [employer]? Se supervisar su estado de salud atentamente mientras reciba este medicamento. Este medicamento podra hacerle sentir un Nurse, mental health. Esto no es inusual, ya que la quimioterapia puede afectar tanto a las clulas sanas como a las clulas cancerosas. Si presenta algn efecto secundario, infrmelo. Contine con el tratamiento incluso si se siente enfermo, a menos que su equipo de [employer] lo suspenda. En algunos casos, podra recibir [employer] para ayudarle con los efectos secundarios. Siga todas las instrucciones para usarlos. Este medicamento puede aumentar su riesgo de contraer una infeccin. Llame para pedir consejo a su equipo de atencin si tiene fiebre, escalofros, dolor de garganta o cualquier otro sntoma de resfriado o gripe. No se trate usted mismo. Trate de no acercarse a personas que estn enfermas. Este medicamento podra aumentar el riesgo de moretones o sangrado. Llame a su equipo de atencin si observa sangrados inusuales. Proceda con cuidado al cepillar sus dientes, usar hilo dental o Risk manager palillos para los dientes, ya que podra contraer una infeccin o Therapist, art con mayor  facilidad. Si recibe algn tratamiento dental, informe a su dentista que est [employer]. Evite usar medicamentos que contengan aspirina, acetaminofeno, ibuprofeno, naproxeno o ketoprofeno, a menos que as lo indique su equipo de atencin. Estos medicamentos pueden ocultar la fiebre. No trate la diarrea con productos de [employer]. Contacte a su equipo de atencin si tiene diarrea por ms de 2 das, o si es grave y [place]. Este medicamento puede aumentar su sensibilidad al sol. Evite la [employer]. Si no la Product manager, utilice ropa protectora y crema de Photographer. No utilice lmparas solares, camas solares ni cabinas solares. Informe a su equipo de atencin si usted o su pareja est buscando un embarazo o si cree que podra estar embarazada. Este medicamento puede causar defectos congnitos graves si se [place] durante el embarazo y por 3 meses despus de la ltima dosis. Se recomienda Risk manager un mtodo anticonceptivo confiable mientras est usando este medicamento y durante 3 meses despus de la ltima dosis. Hable con su equipo de atencin sobre mtodos anticonceptivos eficaces. No embarace a Social research officer, government est usando este medicamento y durante 3 meses despus de la ltima dosis. Use un condn durante las relaciones sexuales que mantenga en [place]. No debe amamantar a un beb mientras est [employer]. Este medicamento podra causar infertilidad. Hable con su equipo de atencin si le preocupa su fertilidad. Qu efectos secundarios puedo tener al [employer] este medicamento? Efectos secundarios que debe informar a su equipo de atencin tan pronto como sea posible: Reacciones alrgicas: erupcin cutnea, comezn/picazn, urticaria, hinchazn de la cara, los  labios, la lengua o la garganta Ataque cardiaco: dolor u opresin en el pecho, los hombros, los brazos o la [place], nuseas, falta de [place], piel fra o sudorosa, sensacin de [place] o  aturdimiento Insuficiencia cardiaca: falta de aire, hinchazn de los tobillos, los pies o las manos, aumento de peso repentino, debilidad o fatiga inusuales Cambios en el ritmo cardiaco: frecuencia cardiaca rpida o irregular, mareos, sensacin de desmayo o aturdimiento, dolor en el pecho, dificultad para respirar Niveles elevados de amonaco: debilidad o fatiga inusuales, confusin, prdida de apetito, nuseas, vmitos, convulsiones Infeccin: fiebre, escalofros, tos, dolor de garganta, heridas que no sanan, dolor o problemas para Geographical information systems officer, sensacin general de molestia o malestar Recuento bajo de glbulos rojos: debilidad o fatiga inusuales, mareo, dolor de cabeza, dificultad para Dietitian, hormigueo o entumecimiento en las manos o los pies, debilidad muscular, cambios en la visin, confusin o dificultad para hablar, prdida del equilibrio o la coordinacin, dificultad para caminar, convulsiones Enrojecimiento, hinchazn y [employer] en la piel de las manos y los pies Diarrea grave o prolongada Sangrado o moretones inusuales Efectos secundarios que generalmente no requieren atencin mdica (debe informarlos a su equipo de atencin si persisten o si son molestos): Piel seca Dolor de cabeza Aumento de Armed forces technical officer, enrojecimiento o hinchazn con llagas dentro de la boca o la garganta Sensibilidad a la luz [place] Puede ser que esta lista no menciona todos los posibles efectos secundarios. Comunquese a su mdico por asesoramiento mdico [employer]. Usted puede informar los efectos secundarios a la FDA por telfono al 1-800-FDA-1088. Dnde debo guardar mi medicina? Este medicamento se administra en hospitales o clnicas. No se guarda en su casa. ATENCIN: Este folleto es un resumen. Puede ser que no cubra toda la posible informacin. Si usted tiene preguntas acerca de esta medicina, consulte con su mdico, su farmacutico o su profesional de Radiographer, therapeutic.  2023  Elsevier/Gold Standard (2022-03-01 00:00:00)   The chemotherapy medication bag should finish at 46 hours, 96 hours, or 7 days. For example, if your pump is scheduled for 46 hours and it was put on at 4:00 p.m., it should finish at 2:00 p.m. the day it is scheduled to come off regardless of your appointment time.     Estimated time to finish at 11:00 a.m. on Thursday 08/10/2022.   If the display on your pump reads "Low Volume" and it is beeping, take the batteries out of the pump and come to the cancer center for it to be taken off.   If the pump alarms go off prior to the pump reading "Low Volume" then call [phone] and someone can assist you.  If the plunger comes out and the chemotherapy medication is leaking out, please use your home chemo spill kit to clean up the spill. Do NOT use paper towels or other household products.  If you have problems or questions regarding your pump, please call either [phone] (24 hours a day) or the cancer center Monday-Friday 8:00 a.m.- 4:30 p.m. at the clinic number and we will assist you. If you are unable to get assistance, then go to the nearest Emergency Department and ask the staff to contact the IV team for assistance.

## 2022-08-08 NOTE — Progress Notes (Signed)
Patient seen by Dr. Truett Perna today  Vitals are within treatment parameters.No intervention needed for BP 144/64  Labs reviewed by Dr. Truett Perna and are within treatment parameters.  Per physician team, patient is ready for treatment and there are NO modifications to the treatment plan.

## 2022-08-10 ENCOUNTER — Inpatient Hospital Stay: Payer: Commercial Managed Care - HMO

## 2022-08-10 VITALS — BP 144/73 | HR 62 | Temp 97.5°F | Resp 18

## 2022-08-10 DIAGNOSIS — Z5111 Encounter for antineoplastic chemotherapy: Secondary | ICD-10-CM | POA: Diagnosis not present

## 2022-08-10 DIAGNOSIS — C189 Malignant neoplasm of colon, unspecified: Secondary | ICD-10-CM

## 2022-08-10 MED ORDER — HEPARIN SOD (PORK) LOCK FLUSH 100 UNIT/ML IV SOLN
500.0000 [IU] | Freq: Once | INTRAVENOUS | Status: AC | PRN
  Administered 2022-08-10: 500 [IU]

## 2022-08-10 MED ORDER — PEGFILGRASTIM-CBQV 6 MG/0.6ML ~~LOC~~ SOSY
6.0000 mg | PREFILLED_SYRINGE | Freq: Once | SUBCUTANEOUS | Status: AC
  Administered 2022-08-10: 6 mg via SUBCUTANEOUS
  Filled 2022-08-10: qty 0.6

## 2022-08-10 MED ORDER — SODIUM CHLORIDE 0.9% FLUSH
10.0000 mL | INTRAVENOUS | Status: DC | PRN
  Administered 2022-08-10: 10 mL

## 2022-08-10 NOTE — Patient Instructions (Signed)

## 2022-08-16 ENCOUNTER — Inpatient Hospital Stay: Payer: Commercial Managed Care - HMO

## 2022-08-18 ENCOUNTER — Ambulatory Visit (HOSPITAL_COMMUNITY)
Admission: RE | Admit: 2022-08-18 | Discharge: 2022-08-18 | Disposition: A | Discharge status: Home / Self Care | Payer: Commercial Managed Care - HMO | Source: Ambulatory Visit | Attending: Oncology | Admitting: Oncology

## 2022-08-18 ENCOUNTER — Inpatient Hospital Stay: Payer: Commercial Managed Care - HMO

## 2022-08-18 DIAGNOSIS — C189 Malignant neoplasm of colon, unspecified: Secondary | ICD-10-CM | POA: Insufficient documentation

## 2022-08-18 DIAGNOSIS — Z95828 Presence of other vascular implants and grafts: Secondary | ICD-10-CM

## 2022-08-18 DIAGNOSIS — Z5111 Encounter for antineoplastic chemotherapy: Secondary | ICD-10-CM | POA: Diagnosis not present

## 2022-08-18 LAB — CMP (CANCER CENTER ONLY)
ALT: 13 U/L (ref 0–44)
AST: 16 U/L (ref 15–41)
Albumin: 3.8 g/dL (ref 3.5–5.0)
Alkaline Phosphatase: 113 U/L (ref 38–126)
Anion gap: 6 (ref 5–15)
BUN: 14 mg/dL (ref 8–23)
CO2: 26 mmol/L (ref 22–32)
Calcium: 9.1 mg/dL (ref 8.9–10.3)
Chloride: 108 mmol/L (ref 98–111)
Creatinine: 0.94 mg/dL (ref 0.61–1.24)
GFR, Estimated: >60 mL/min (ref >60)
Glucose, Bld: 117 mg/dL — ABNORMAL HIGH (ref 70–99)
Potassium: 4 mmol/L (ref 3.5–5.1)
Sodium: 140 mmol/L (ref 135–145)
Total Bilirubin: 0.4 mg/dL (ref 0.3–1.2)
Total Protein: 6.4 g/dL — ABNORMAL LOW (ref 6.5–8.1)

## 2022-08-18 LAB — CBC WITH DIFFERENTIAL (CANCER CENTER ONLY)
Abs Immature Granulocytes: 0.08 10*3/uL — ABNORMAL HIGH (ref 0.00–0.07)
Basophils Absolute: 0.1 10*3/uL (ref 0.0–0.1)
Basophils Relative: 1 %
Eosinophils Absolute: 0.2 10*3/uL (ref 0.0–0.5)
Eosinophils Relative: 2 %
HCT: 39.1 % (ref 39.0–52.0)
Hemoglobin: 12.6 g/dL — ABNORMAL LOW (ref 13.0–17.0)
Immature Granulocytes: 1 %
Lymphocytes Relative: 16 %
Lymphs Abs: 1.3 10*3/uL (ref 0.7–4.0)
MCH: 30.4 pg (ref 26.0–34.0)
MCHC: 32.2 g/dL (ref 30.0–36.0)
MCV: 94.4 fL (ref 80.0–100.0)
Monocytes Absolute: 1.1 10*3/uL — ABNORMAL HIGH (ref 0.1–1.0)
Monocytes Relative: 13 %
Neutro Abs: 5.5 10*3/uL (ref 1.7–7.7)
Neutrophils Relative %: 67 %
Platelet Count: 167 10*3/uL (ref 150–400)
RBC: 4.14 MIL/uL — ABNORMAL LOW (ref 4.22–5.81)
RDW: 16.7 % — ABNORMAL HIGH (ref 11.5–15.5)
WBC Count: 8.2 10*3/uL (ref 4.0–10.5)
nRBC: 0 % (ref 0.0–0.2)

## 2022-08-18 LAB — CEA (ACCESS): CEA (CHCC): 16.85 ng/mL — ABNORMAL HIGH (ref 0.00–5.00)

## 2022-08-18 LAB — TOTAL PROTEIN, URINE DIPSTICK: Protein, ur: NEGATIVE mg/dL

## 2022-08-18 MED ORDER — IOHEXOL 300 MG/ML  SOLN
100.0000 mL | Freq: Once | INTRAMUSCULAR | Status: AC | PRN
  Administered 2022-08-18: 100 mL via INTRAVENOUS

## 2022-08-18 MED ORDER — SODIUM CHLORIDE 0.9% FLUSH
10.0000 mL | INTRAVENOUS | Status: AC | PRN
  Administered 2022-08-18: 10 mL

## 2022-08-20 ENCOUNTER — Other Ambulatory Visit: Payer: Self-pay | Admitting: Oncology

## 2022-08-20 DIAGNOSIS — C189 Malignant neoplasm of colon, unspecified: Secondary | ICD-10-CM

## 2022-08-22 ENCOUNTER — Inpatient Hospital Stay: Payer: Commercial Managed Care - HMO

## 2022-08-22 ENCOUNTER — Telehealth: Payer: Self-pay | Admitting: Oncology

## 2022-08-22 ENCOUNTER — Inpatient Hospital Stay (HOSPITAL_BASED_OUTPATIENT_CLINIC_OR_DEPARTMENT_OTHER): Payer: Commercial Managed Care - HMO | Admitting: Oncology

## 2022-08-22 VITALS — BP 147/74 | HR 87 | Temp 98.2°F | Resp 18 | Ht 68.0 in | Wt 168.4 lb

## 2022-08-22 DIAGNOSIS — C189 Malignant neoplasm of colon, unspecified: Secondary | ICD-10-CM | POA: Diagnosis not present

## 2022-08-22 DIAGNOSIS — Z95828 Presence of other vascular implants and grafts: Secondary | ICD-10-CM | POA: Diagnosis not present

## 2022-08-22 DIAGNOSIS — Z5111 Encounter for antineoplastic chemotherapy: Secondary | ICD-10-CM | POA: Diagnosis not present

## 2022-08-22 MED ORDER — HEPARIN SOD (PORK) LOCK FLUSH 100 UNIT/ML IV SOLN: 500.0000 [IU] | Freq: Once | INTRAVENOUS | Status: DC

## 2022-08-22 MED ORDER — DOXYCYCLINE HYCLATE 100 MG PO TABS: 100.0000 mg | ORAL_TABLET | Freq: Two times a day (BID) | ORAL | 3 refills | Status: DC

## 2022-08-22 MED ORDER — SODIUM CHLORIDE 0.9% FLUSH: 10.0000 mL | INTRAVENOUS | Status: DC | PRN

## 2022-08-22 NOTE — Progress Notes (Signed)
[facility] OFFICE PROGRESS NOTE   Diagnosis: Colon cancer  INTERVAL HISTORY:   [name] returns as scheduled.  He is here with his son and a Spanish interpreter.  He reports feeling well.  He completed another cycle of FOLFIRI/bevacizumab on 08/08/2022.  No nausea/vomiting, mouth sores, or diarrhea.  He developed a rash over the scalp.  He relates this to using shaving cream on his scalp.  He has a sore throat.  He has occasional discomfort in the right lower abdomen.  Objective:  Vital signs in last 24 hours:  Blood pressure (!) 147/74, pulse 87, temperature 98.2 F (36.8 C), temperature source Oral, resp. rate 18, height 5\' 8"  (1.727 m), weight 168 lb 6.4 oz (76.4 kg), SpO2 98 %.    HEENT: No thrush or ulcers Resp: Lungs clear bilaterally Cardio: Regular rate and rhythm GI: No hepatosplenomegaly,, no mass, nontender Vascular: No leg edema  Skin: No rash over the scalp  Portacath/PICC-without erythema  Lab Results:  Lab Results  Component Value Date   WBC 8.2 08/18/2022   HGB 12.6 (L) 08/18/2022   HCT 39.1 08/18/2022   MCV 94.4 08/18/2022   PLT 167 08/18/2022   NEUTROABS 5.5 08/18/2022    CMP  Lab Results  Component Value Date   NA 140 08/18/2022   K 4.0 08/18/2022   CL 108 08/18/2022   CO2 26 08/18/2022   GLUCOSE 117 (H) 08/18/2022   BUN 14 08/18/2022   CREATININE 0.94 08/18/2022   CALCIUM 9.1 08/18/2022   PROT 6.4 (L) 08/18/2022   ALBUMIN 3.8 08/18/2022   AST 16 08/18/2022   ALT 13 08/18/2022   ALKPHOS 113 08/18/2022   BILITOT 0.4 08/18/2022   GFRNONAA >60 08/18/2022   GFRAA >60 12/01/2019    Lab Results  Component Value Date   CEA1 12.10 (H) 01/03/2021   CEA 16.85 (H) 08/18/2022     Imaging:  CT ABDOMEN PELVIS W CONTRAST  Result Date: 08/21/2022 CLINICAL DATA:  Restaging metastatic colon cancer. * Tracking Code: BO * EXAM: CT ABDOMEN AND PELVIS WITH CONTRAST TECHNIQUE: Multidetector CT imaging of the abdomen and pelvis  was performed using the standard protocol following bolus administration of intravenous contrast. RADIATION DOSE REDUCTION: This exam was performed according to the departmental dose-optimization program which includes automated exposure control, adjustment of the mA and/or kV according to patient size and/or use of iterative reconstruction technique. CONTRAST:  OMNIPAQUE IOHEXOL 300 MG/ML  SOLN COMPARISON:  CT 06/02/2022 and older FINDINGS: Lower chest: There is some linear opacity lung bases likely scar or atelectasis. No pleural effusion Hepatobiliary: Diffuse fatty liver infiltration identified. Patent portal vein. The gallbladder is contracted but there are dependent stones. Once again there are numerous areas of hepatic metastasis. The extent and distribution appears similar to the prior examination. Specific lesions will be followed for continuity. This includes dome lesion on the right side which on the prior measured 2.4 x 2.1 cm and today 2.9 x 2.3 cm on series 2, image 3. Lesion seen posteriorly in the left hepatic lobe abutting the intrahepatic IVC which on the prior measured 2.4 x 2.4 cm, today on series 2 image 7 measures 2.2 x 2.4 cm. Third lesion seen in segment 4B which on the prior would have measured 19 by 16 mm, today on series 2, image 18 measures 19 by 17 mm. Not significantly changed. There is a lesion seen inferiorly in the right hepatic lobe on series 2, image 23 is larger measuring  12 by 11 mm today and previously measured only 6 mm. Overall there are a few lesions which is slightly increased. Many are stable as well. Pancreas: Unremarkable. No pancreatic ductal dilatation or surrounding inflammatory changes. Spleen: Normal in size without focal abnormality. Adrenals/Urinary Tract: Adrenal glands are preserved. No enhancing renal mass. Once again there are renal cysts including a very large left-sided focus measuring 9.5 cm today, similar to previous. No aggressive features of these  lesions. No specific follow-up of the renal cysts. No separate enhancing renal mass. The ureters have normal course and caliber down to the bladder. Preserved contours of the distended urinary bladder. Slight wall thickening and trabeculation. Stomach/Bowel: Large bowel is nondilated. Surgical changes from right hemicolectomy. There also surgical changes along the sigmoid colon. Scattered distal colonic diverticula. Stomach and small bowel are nondilated. There are some tethering of the distal second, proximal third portion of the duodenum related to the adjacent mesenteric mass. Please see coronal imaging series 4, image 52. Involvement of the wall of the duodenal is not excluded by this adjacent process. Central mesenteric mass in the right hemi abdominal mesentery is again seen, likely abnormal nodes. Previously this area measured 5.3 x 3.7 cm and today when measured in the same fashion on series 2, image 39 5.1 by 4.0 cm. Several adjacent nodes are noted as well. There is also an area of nodularity seen anterior to the rectum above the seminal vesicles. On the prior this measured 2.4 x 1.4 cm and today when measured in the same fashion 2.6 x 1.5 cm. Subtle area of nodularity above this has not well seen on the current examination. Vascular/Lymphatic: Normal caliber aorta and IVC with scattered vascular calcifications. Significant calcifications are seen along the left renal artery in particular. No separate retroperitoneal lymph node enlargement at this time. Reproductive: Enlarged prostate with mass effect along the base of the bladder. Other: Small fat containing inguinal hernias. No free air or free fluid. Mild mesenteric stranding. Musculoskeletal: Mild curvature of the spine. Slight degenerative changes. IMPRESSION: Multiple liver metastases identified. There are several lesions which are stable a few of the smaller lesions are slightly larger today when adjusting for technique. Underlying fatty liver  infiltration. Right hemi abdominal mesenteric mass is similar to previous. There is some tethering of the adjacent duodenum once again. In addition of the small soft tissue nodule in the low pelvis is stable compared to the recent prior. No bowel obstruction.  Surgical changes from right hemicolectomy. Gallstones. Electronically Signed   By: Karen Kays M.D.   On: 08/21/2022 16:28    Medications: I have reviewed the patient's current medications.   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), transverse colon cancer stage IIB (T4aN0) status post extended right hemicolectomy 05/07/2019, MSS Tumor invasive superficial portion of the muscle ("internal third "), no tumor perforation, no vascular invasion or perineural invasion, negative resection margins, 0/4 lymph nodes Elevated CEA 02/10/2019 CTs 03/05/2019, compared to outside CTs from 11/22/2018-worsened wall thickening in the cecum stable apple core lesion in the mid transverse colon, no evidence of recurrent tumor at the distal colonic anastomosis, no evidence of metastatic disease Colonoscopy 03/24/2019 20-2 malignant appearing masses noted in the colon, 1 filled the cecum measuring approximately 4 cm across and friable.  The other malignant appearing mass was circumferential creating a stricture with a 1.2 cm lumen located in the transverse segment approximately 4 to 5 cm  in length.  There were 12-18 neoplastic appearing polyps in between the 2 obvious malignant masses ranging in size from 3 mm to 2 cm.  5 polyps distal to the transverse colon mass.  2 sigmoid colon polyps.  Cecum mass positive for adenocarcinoma.  Transverse colon mass positive for adenocarcinoma. Foundation 1 transverse colon-MSS, tumor mutation burden 7, KRAS wild-type, BRAF fusion 05/07/2019 laparoscopic extended right hemicolectomy, lysis of adhesions-5 cm invasive  moderately differentiated adenocarcinoma involving cecum and proximal ascending colon, carcinoma invades into the pericolonic soft tissue; 7 cm invasive moderately differentiated carcinoma involving the transverse colon, carcinoma invades into the serosal surface (visceral peritoneum); all resection margins negative for carcinoma.  Lymphovascular invasion present.  In the proximal colon metastatic carcinoma involves 3 of 17 lymph nodes with 1 tumor deposit.  In the transverse colon 12 lymph nodes negative for metastatic carcinoma.  3 separate polyps: Tubular adenoma, inflammatory polyp and hyperplastic polyp Cycle 1 capecitabine 06/02/2019 Cycle 2 capecitabine 06/23/2019 Cycle 3 capecitabine 07/14/2019 Cycle 4 capecitabine 08/04/2019 Cycle 5 capecitabine 08/25/2019 Cycle 6 capecitabine 09/15/2019 Cycle 7 capecitabine 10/06/2019 Cycle 8 capecitabine 10/27/2019 Upper endoscopy 07/23/2020-negative Colonoscopy 07/23/2020-normal-appearing anastomoses, polyp removed from the descending colon-tubular adenoma CTs 01/18/2021-right abdominal mesenteric mass, mass in the low anatomic pelvis consistent with metastases Cycle 1 FOLFOX 02/21/2021 Cycle 2 FOLFOX 03/07/2021, dose reductions made due to mild neutropenia Cycle 3 FOLFOX 03/21/2021 Cycle 4 FOLFOX 04/04/2021 Cycle 5 FOLFOX 04/19/2021 Cycle 6 FOLFOX 05/02/2021 CTs 05/12/2021-decrease size of nodular right mesenteric mass, complete resolution of soft tissue enhancing nodule in the right inguinal canal, resolution of nodular soft tissue mass posterior to the bladder, enlargement of a left upper lobe nodule and new 4 mm left upper lobe nodule-indeterminate Cycle 7 FOLFOX 05/16/2021 Cycle 8 FOLFOX 05/30/2021, oxaliplatin and 5-FU bolus held due to neuropathy and thrombocytopenia Cycle 9 FOLFOX 06/13/2021, oxaliplatin dose reduced Cycle 10 FOLFOX 06/27/2021 CT 07/07/2021-decrease in right mesenteric mass, decreased size of lobulated left upper lobe nodule, resolution of previous  "new "left upper lobe nodule, new area of wall thickening at the distal transverse colon Treatment changed to Xeloda 2 weeks on/1 week off beginning 07/18/2021 Cycle 2 Xeloda beginning 08/09/2021 Cycle 3 Xeloda beginning 08/30/2021 Cycle 4 Xeloda beginning 09/21/2021 Cycle 5 Xeloda beginning 10/12/2021 Cycle 6 Xeloda beginning 11/02/2021 CTs 11/14/2021-right lower quadrant spiculated small bowel mesenteric mass measured 2.8 x 3.7 cm, previously 1.0 x 2.3 cm; adjacent mesenteric haziness and nodularity measuring up to 8 mm superiorly.  New 4 mm nodular lesion posterior segment right upper lobe along the major fissure. Treatment break beginning 11/17/2021 CTs 03/21/2022-multiple new liver metastases, enlarging implant in the right ileocolic mesentery, enlarging peritoneal implants in the low pelvis Cycle 1 FOLFIRI/Avastin 03/29/2022 Cycle 2 FOLFIRI/Avastin 04/11/2022 Cycle 3 FOLFIRI/Avastin 04/26/2022 Cycle 4 FOLFIRI/Avastin 05/10/2022 Cycle 5 FOLFIRI/Avastin 05/24/2022 CT abdomen/pelvis 06/02/2022-liver metastases are stable and smaller, smaller peritoneal/mesenteric nodules Cycle 6 FOLFIRI/Avastin 06/06/2022 Cycle 7 FOLFIRI/Avastin 06/20/2022 Cycle 8 FOLFIRI/Avastin 07/04/2022 Treatment held 07/18/2022 due to neutropenia Cycle 9 FOLFIRI/Avastin 07/24/2022, Udenyca Cycle 10 FOLFIRI/Avastin 08/08/2022, Udenyca CTs 08/18/2022-slight enlargement of several liver lesions, right mesenteric mass and low pelvic soft tissue nodule are stable 2.   Microcytic anemia secondary to #1.    Resolved 3.   Family history of pancreas and colon cancer, negative genetic testing per the [facility] panel, ATM variant of unknown significance 4.   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  I reviewed the restaging CT findings and images with  him.  There has been an increase in the size of several liver lesions.  The CEA has been higher over the past few months. The clinical findings are most consistent with slow and minimal disease progression. We discussed treatment options including continuing FOLFIRI/bevacizumab and switching to a different systemic therapy regimen.  Foundation 1 testing found the transverse tumor to be K-ras wild-type.  I recommend continuing FOLFIRI, discontinuation of bevacizumab, and adding panitumumab.  We reviewed potential toxicities associated with panitumumab including the chance of an allergic reaction, rash, diarrhea, and hypomagnesemia.  He agrees to proceed.  The plan is to begin FOLFIRI/panitumumab on 08/29/2022.  A chemotherapy plan was entered today. Thornton Papas, MD  08/22/2022  9:31 AM

## 2022-08-22 NOTE — Progress Notes (Signed)
Visit today assisted by spanish interpreter, [facility] w/[facility]

## 2022-08-23 ENCOUNTER — Other Ambulatory Visit: Payer: Self-pay

## 2022-08-24 ENCOUNTER — Inpatient Hospital Stay: Payer: Commercial Managed Care - HMO

## 2022-08-28 ENCOUNTER — Encounter: Payer: Self-pay | Admitting: *Deleted

## 2022-08-28 NOTE — Progress Notes (Signed)
Per Dr. Truett Perna: Does not need labs on 08/29/22 treatment. Can treat based on 08/18/22 labs

## 2022-08-29 ENCOUNTER — Inpatient Hospital Stay: Payer: Commercial Managed Care - HMO | Attending: Oncology

## 2022-08-29 VITALS — BP 156/72 | HR 62 | Temp 97.7°F | Resp 18 | Ht 68.0 in | Wt 170.4 lb

## 2022-08-29 DIAGNOSIS — C787 Secondary malignant neoplasm of liver and intrahepatic bile duct: Secondary | ICD-10-CM | POA: Diagnosis not present

## 2022-08-29 DIAGNOSIS — D509 Iron deficiency anemia, unspecified: Secondary | ICD-10-CM | POA: Diagnosis not present

## 2022-08-29 DIAGNOSIS — Z5189 Encounter for other specified aftercare: Secondary | ICD-10-CM | POA: Diagnosis not present

## 2022-08-29 DIAGNOSIS — G620 Drug-induced polyneuropathy: Secondary | ICD-10-CM | POA: Diagnosis not present

## 2022-08-29 DIAGNOSIS — Z5111 Encounter for antineoplastic chemotherapy: Secondary | ICD-10-CM | POA: Insufficient documentation

## 2022-08-29 DIAGNOSIS — R21 Rash and other nonspecific skin eruption: Secondary | ICD-10-CM | POA: Insufficient documentation

## 2022-08-29 DIAGNOSIS — C187 Malignant neoplasm of sigmoid colon: Secondary | ICD-10-CM | POA: Diagnosis not present

## 2022-08-29 DIAGNOSIS — C189 Malignant neoplasm of colon, unspecified: Secondary | ICD-10-CM

## 2022-08-29 MED ORDER — FLUOROURACIL CHEMO INJECTION 2.5 GM/50ML
300.0000 mg/m2 | Freq: Once | INTRAVENOUS | Status: AC
  Administered 2022-08-29: 600 mg via INTRAVENOUS
  Filled 2022-08-29: qty 12

## 2022-08-29 MED ORDER — SODIUM CHLORIDE 0.9 % IV SOLN
6.0000 mg/kg | Freq: Once | INTRAVENOUS | Status: AC
  Administered 2022-08-29: 500 mg via INTRAVENOUS
  Filled 2022-08-29: qty 5

## 2022-08-29 MED ORDER — SODIUM CHLORIDE 0.9 % IV SOLN
180.0000 mg/m2 | Freq: Once | INTRAVENOUS | Status: AC
  Administered 2022-08-29: 340 mg via INTRAVENOUS
  Filled 2022-08-29: qty 15

## 2022-08-29 MED ORDER — ATROPINE SULFATE 1 MG/ML IV SOLN
0.5000 mg | Freq: Once | INTRAVENOUS | Status: AC | PRN
  Administered 2022-08-29: 0.5 mg via INTRAVENOUS
  Filled 2022-08-29: qty 1

## 2022-08-29 MED ORDER — PALONOSETRON HCL INJECTION 0.25 MG/5ML
0.2500 mg | Freq: Once | INTRAVENOUS | Status: AC
  Administered 2022-08-29: 0.25 mg via INTRAVENOUS
  Filled 2022-08-29: qty 5

## 2022-08-29 MED ORDER — SODIUM CHLORIDE 0.9 % IV SOLN: Freq: Once | INTRAVENOUS | Status: AC

## 2022-08-29 MED ORDER — SODIUM CHLORIDE 0.9 % IV SOLN
2000.0000 mg/m2 | INTRAVENOUS | Status: DC
  Administered 2022-08-29: 3850 mg via INTRAVENOUS
  Filled 2022-08-29: qty 77

## 2022-08-29 MED ORDER — SODIUM CHLORIDE 0.9 % IV SOLN
300.0000 mg/m2 | Freq: Once | INTRAVENOUS | Status: AC
  Administered 2022-08-29: 580 mg via INTRAVENOUS
  Filled 2022-08-29: qty 29

## 2022-08-29 MED ORDER — SODIUM CHLORIDE 0.9 % IV SOLN
10.0000 mg | Freq: Once | INTRAVENOUS | Status: AC
  Administered 2022-08-29: 10 mg via INTRAVENOUS
  Filled 2022-08-29: qty 10

## 2022-08-29 NOTE — Patient Instructions (Signed)
Instrucciones al darle de alta: Discharge Instructions Gracias por elegir al [facility] de Cncer de [place] para brindarle atencin mdica de oncologa y Teacher, English as a foreign language.   Si usted tiene una cita de laboratorio con [employer] de [place], por favor vaya directamente al [employer] de Cncer y regstrese en el rea de Engineer, maintenance (IT).   Use ropa cmoda y [place] para tener fcil acceso a las vas del Portacath (acceso venoso de Set designer duracin) o la lnea PICC (catter central colocado por va perifrica).   Nos esforzamos por ofrecerle tiempo de calidad con su proveedor. Es posible que tenga que volver a programar su cita si llega tarde (15 minutos o ms).  El llegar tarde le afecta a usted y a otros pacientes cuyas citas son posteriores a Armed forces operational officer.  Adems, si usted falta a tres o ms citas sin avisar a la oficina, puede ser retirado(a) de la clnica a discrecin del proveedor.      Para las solicitudes de renovacin de recetas, pida a su farmacia que se ponga en contacto con nuestra oficina y deje que transcurran 72 horas para que se complete el proceso de las renovaciones.    Hoy usted recibi los siguientes agentes de quimioterapia e/o inmunoterapia Panitumumab (VECTIBIX), Irinotecan (CAMPTOSAR), Leucovorin & Flourouracil (ADRUCIL).      Para ayudar a prevenir las nuseas y los vmitos despus de su tratamiento, le recomendamos que tome su medicamento para las nuseas segn las indicaciones.  LOS SNTOMAS QUE DEBEN COMUNICARSE INMEDIATAMENTE SE INDICAN A CONTINUACIN: *FIEBRE SUPERIOR A 100.4 F (38 C) O MS *ESCALOFROS O SUDORACIN *NUSEAS Y VMITOS QUE NO SE CONTROLAN CON EL MEDICAMENTO PARA LAS NUSEAS *DIFICULTAD INUSUAL PARA RESPIRAR  *MORETONES O HEMORRAGIAS NO HABITUALES *PROBLEMAS URINARIOS (dolor o ardor al Geographical information systems officer o frecuencia para Geographical information systems officer) *PROBLEMAS INTESTINALES (diarrea inusual, estreimiento, dolor cerca del ano) SENSIBILIDAD EN LA BOCA Y EN LA GARGANTA CON O SIN LA PRESENCIA DE LCERAS  (dolor de garganta, llagas en la boca o dolor de muelas/dientes) ERUPCIN, HINCHAZN O DOLORES INUSUALES FLUJO VAGINAL INUSUAL O PICAZN/RASQUIA    Los puntos marcados con un asterisco ( *) indican una posible emergencia y debe hacer un seguimiento tan pronto como le sea posible o vaya al Departamento de Emergencias si se le presenta algn problema.  Por favor, muestre la [place] DE ADVERTENCIA DE Marc Morgans DE ADVERTENCIA DE [place] Fanti al registrarse en [address] de Emergencias y a la enfermera de triaje.  Si tiene preguntas despus de su visita o necesita cancelar o volver a programar su cita, por favor pngase en contacto con [facility]  Dept: [phone]  y siga las instrucciones. Las horas de oficina son de 8:00 a.m. a 4:30 p.m. de lunes a viernes. Por favor, tenga en cuenta que los mensajes de voz que se dejan despus de las 4:00 p.m. posiblemente no se devolvern hasta el siguiente da de [place].  Cerramos los fines de semana y [employer]. En todo momento tiene acceso a una enfermera para preguntas urgentes. Por favor, llame al nmero principal de la clnica Dept: [phone] y siga las instrucciones.   Para cualquier pregunta que no sea de carcter urgente, tambin puede ponerse en contacto con su proveedor [employer]. Ahora ofrecemos visitas electrnicas para cualquier persona mayor de 18 aos que solicite atencin mdica en lnea para los sntomas que no sean urgentes. Para ms detalles vaya a [email or web address].   Tambin puede bajar la aplicacin de MyChart! Vaya a la  tienda de aplicaciones, busque "MyChart", abra la aplicacin, seleccione [place], e ingrese con su nombre de usuario y la contrasea de Clinical cytogeneticist.  Panitumumab Injection What is this medication? PANITUMUMAB (pan i TOOM ue mab) treats colorectal cancer. It works by blocking a protein that causes cancer cells to grow and  multiply. This helps to slow or stop the spread of cancer cells. It is a monoclonal antibody. This medicine may be used for other purposes; ask your health care provider or pharmacist if you have questions. COMMON BRAND NAME(S): Vectibix What should I tell my care team before I take this medication? They need to know if you have any of these conditions: Eye disease Low levels of magnesium in the blood Lung disease An unusual or allergic reaction to panitumumab, other medications, foods, dyes, or preservatives Pregnant or trying to get pregnant Breast-feeding How should I use this medication? This medication is injected into a vein. It is given by your care team in a hospital or clinic setting. Talk to your care team about the use of this medication in children. Special care may be needed. Overdosage: If you think you have taken too much of this medicine contact a poison control center or emergency room at once. NOTE: This medicine is only for you. Do not share this medicine with others. What if I miss a dose? Keep appointments for follow-up doses. It is important not to miss your dose. Call your care team if you are unable to keep an appointment. What may interact with this medication? Bevacizumab This list may not describe all possible interactions. Give your health care provider a list of all the medicines, herbs, non-prescription drugs, or dietary supplements you use. Also tell them if you smoke, drink alcohol, or use illegal drugs. Some items may interact with your medicine. What should I watch for while using this medication? Your condition will be monitored carefully while you are receiving this medication. This medication may make you feel generally unwell. This is not uncommon as chemotherapy can affect healthy cells as well as cancer cells. Report any side effects. Continue your course of treatment even though you feel ill unless your care team tells you to stop. This medication can  make you more sensitive to the sun. Keep out of the sun while receiving this medication and for 2 months after stopping therapy. If you cannot avoid being in the sun, wear protective clothing and sunscreen. Do not use sun lamps, tanning beds, or tanning booths. Check with your care team if you have severe diarrhea, nausea, and vomiting or if you sweat a lot. The loss of too much body fluid may make it dangerous for you to take this medication. This medication may cause serious skin reactions. They can happen weeks to months after starting the medication. Contact your care team right away if you notice fevers or flu-like symptoms with a rash. The rash may be red or purple and then turn into blisters or peeling of the skin. You may also notice a red rash with swelling of the face, lips, or lymph nodes in your neck or under your arms. Talk to your care team if you may be pregnant. Serious birth defects can occur if you take this medication during pregnancy and for 2 months after the last dose. Contraception is recommended while taking this medication and for 2 months after the last dose. Your care team can help you find the option that works for you. Do not breastfeed while taking this  medication and for 2 months after the last dose. This medication may cause infertility. Talk to your care team if you are concerned about your fertility. What side effects may I notice from receiving this medication? Side effects that you should report to your care team as soon as possible: Allergic reactions--skin rash, itching, hives, swelling of the face, lips, tongue, or throat Dry cough, shortness of breath or trouble breathing Eye pain, redness, irritation, or discharge with blurry or decreased vision Infusion reactions--chest pain, shortness of breath or trouble breathing, feeling faint or lightheaded Low magnesium level--muscle pain or cramps, unusual weakness or fatigue, fast or irregular heartbeat, tremors Low  potassium level--muscle pain or cramps, unusual weakness or fatigue, fast or irregular heartbeat, constipation Redness, blistering, peeling, or loosening of the skin, including inside the mouth Skin reactions on sun-exposed areas Side effects that usually do not require medical attention (report to your care team if they continue or are bothersome): Change in nail shape, thickness, or color Diarrhea Dry skin Fatigue Nausea Vomiting This list may not describe all possible side effects. Call your doctor for medical advice about side effects. You may report side effects to FDA at 1-800-FDA-1088. Where should I keep my medication? This medication is given in a hospital or clinic. It will not be stored at home. NOTE: This sheet is a summary. It may not cover all possible information. If you have questions about this medicine, talk to your doctor, pharmacist, or health care provider.  2023 Elsevier/Gold Standard (2021-08-22 00:00:00)  Irinotecan Injection What is this medication? IRINOTECAN (ir in oh TEE kan) treats some types of cancer. It works by slowing down the growth of cancer cells. This medicine may be used for other purposes; ask your health care provider or pharmacist if you have questions. COMMON BRAND NAME(S): Camptosar What should I tell my care team before I take this medication? They need to know if you have any of these conditions: Dehydration Diarrhea Infection, especially a viral infection, such as chickenpox, cold sores, herpes Liver disease Low blood cell levels (white cells, red cells, and platelets) Low levels of electrolytes, such as calcium, magnesium, or potassium in your blood Recent or ongoing radiation An unusual or allergic reaction to irinotecan, other medications, foods, dyes, or preservatives If you or your partner are pregnant or trying to get pregnant Breast-feeding How should I use this medication? This medication is injected into a vein. It is given by  your care team in a hospital or clinic setting. Talk to your care team about the use of this medication in children. Special care may be needed. Overdosage: If you think you have taken too much of this medicine contact a poison control center or emergency room at once. NOTE: This medicine is only for you. Do not share this medicine with others. What if I miss a dose? Keep appointments for follow-up doses. It is important not to miss your dose. Call your care team if you are unable to keep an appointment. What may interact with this medication? Do not take this medication with any of the following: Cobicistat Itraconazole This medication may also interact with the following: Certain antibiotics, such as clarithromycin, rifampin, rifabutin Certain antivirals for HIV or AIDS Certain medications for fungal infections, such as ketoconazole, posaconazole, voriconazole Certain medications for seizures, such as carbamazepine, phenobarbital, phenytoin Gemfibrozil Nefazodone St. John's wort This list may not describe all possible interactions. Give your health care provider a list of all the medicines, herbs, non-prescription drugs, or  dietary supplements you use. Also tell them if you smoke, drink alcohol, or use illegal drugs. Some items may interact with your medicine. What should I watch for while using this medication? Your condition will be monitored carefully while you are receiving this medication. You may need blood work while taking this medication. This medication may make you feel generally unwell. This is not uncommon as chemotherapy can affect healthy cells as well as cancer cells. Report any side effects. Continue your course of treatment even though you feel ill unless your care team tells you to stop. This medication can cause serious side effects. To reduce the risk, your care team may give you other medications to take before receiving this one. Be sure to follow the directions from  your care team. This medication may affect your coordination, reaction time, or judgement. Do not drive or operate machinery until you know how this medication affects you. Sit up or stand slowly to reduce the risk of dizzy or fainting spells. Drinking alcohol with this medication can increase the risk of these side effects. This medication may increase your risk of getting an infection. Call your care team for advice if you get a fever, chills, sore throat, or other symptoms of a cold or flu. Do not treat yourself. Try to avoid being around people who are sick. Avoid taking medications that contain aspirin, acetaminophen, ibuprofen, naproxen, or ketoprofen unless instructed by your care team. These medications may hide a fever. This medication may increase your risk to bruise or bleed. Call your care team if you notice any unusual bleeding. Be careful brushing or flossing your teeth or using a toothpick because you may get an infection or bleed more easily. If you have any dental work done, tell your dentist you are receiving this medication. Talk to your care team if you or your partner are pregnant or think either of you might be pregnant. This medication can cause serious birth defects if taken during pregnancy and for 6 months after the last dose. You will need a negative pregnancy test before starting this medication. Contraception is recommended while taking this medication and for 6 months after the last dose. Your care team can help you find the option that works for you. Do not father a child while taking this medication and for 3 months after the last dose. Use a condom for contraception during this time period. Do not breastfeed while taking this medication and for 7 days after the last dose. This medication may cause infertility. Talk to your care team if you are concerned about your fertility. What side effects may I notice from receiving this medication? Side effects that you should report  to your care team as soon as possible: Allergic reactions--skin rash, itching, hives, swelling of the face, lips, tongue, or throat Dry cough, shortness of breath or trouble breathing Increased saliva or tears, increased sweating, stomach cramping, diarrhea, small pupils, unusual weakness or fatigue, slow heartbeat Infection--fever, chills, cough, sore throat, wounds that don't heal, pain or trouble when passing urine, general feeling of discomfort or being unwell Kidney injury--decrease in the amount of urine, swelling of the ankles, hands, or feet Low red blood cell level--unusual weakness or fatigue, dizziness, headache, trouble breathing Severe or prolonged diarrhea Unusual bruising or bleeding Side effects that usually do not require medical attention (report to your care team if they continue or are bothersome): Constipation Diarrhea Hair loss Loss of appetite Nausea Stomach pain This list may not describe all  possible side effects. Call your doctor for medical advice about side effects. You may report side effects to FDA at 1-800-FDA-1088. Where should I keep my medication? This medication is given in a hospital or clinic. It will not be stored at home. NOTE: This sheet is a summary. It may not cover all possible information. If you have questions about this medicine, talk to your doctor, pharmacist, or health care provider.  2023 Elsevier/Gold Standard (2021-08-18 00:00:00)  Leucovorin Injection What is this medication? LEUCOVORIN (loo koe VOR in) prevents side effects from certain medications, such as methotrexate. It works by increasing folate levels. This helps protect healthy cells in your body. It may also be used to treat anemia caused by low levels of folate. It can also be used with fluorouracil, a type of chemotherapy, to treat colorectal cancer. It works by increasing the effects of fluorouracil in the body. This medicine may be used for other purposes; ask your health  care provider or pharmacist if you have questions. What should I tell my care team before I take this medication? They need to know if you have any of these conditions: Anemia from low levels of vitamin B12 in the blood An unusual or allergic reaction to leucovorin, folic acid, other medications, foods, dyes, or preservatives Pregnant or trying to get pregnant Breastfeeding How should I use this medication? This medication is injected into a vein or a muscle. It is given by your care team in a hospital or clinic setting. Talk to your care team about the use of this medication in children. Special care may be needed. Overdosage: If you think you have taken too much of this medicine contact a poison control center or emergency room at once. NOTE: This medicine is only for you. Do not share this medicine with others. What if I miss a dose? Keep appointments for follow-up doses. It is important not to miss your dose. Call your care team if you are unable to keep an appointment. What may interact with this medication? Capecitabine Fluorouracil Phenobarbital Phenytoin Primidone Trimethoprim;sulfamethoxazole This list may not describe all possible interactions. Give your health care provider a list of all the medicines, herbs, non-prescription drugs, or dietary supplements you use. Also tell them if you smoke, drink alcohol, or use illegal drugs. Some items may interact with your medicine. What should I watch for while using this medication? Your condition will be monitored carefully while you are receiving this medication. This medication may increase the side effects of 5-fluorouracil. Tell your care team if you have diarrhea or mouth sores that do not get better or that get worse. What side effects may I notice from receiving this medication? Side effects that you should report to your care team as soon as possible: Allergic reactions--skin rash, itching, hives, swelling of the face, lips,  tongue, or throat This list may not describe all possible side effects. Call your doctor for medical advice about side effects. You may report side effects to FDA at 1-800-FDA-1088. Where should I keep my medication? This medication is given in a hospital or clinic. It will not be stored at home. NOTE: This sheet is a summary. It may not cover all possible information. If you have questions about this medicine, talk to your doctor, pharmacist, or health care provider.  2023 Elsevier/Gold Standard (2021-08-19 00:00:00)  Fluorouracil Injection What is this medication? FLUOROURACIL (flure oh YOOR a sil) treats some types of cancer. It works by slowing down the growth of cancer cells.  This medicine may be used for other purposes; ask your health care provider or pharmacist if you have questions. COMMON BRAND NAME(S): Adrucil What should I tell my care team before I take this medication? They need to know if you have any of these conditions: Blood disorders Dihydropyrimidine dehydrogenase (DPD) deficiency Infection, such as chickenpox, cold sores, herpes Kidney disease Liver disease Poor nutrition Recent or ongoing radiation therapy An unusual or allergic reaction to fluorouracil, other medications, foods, dyes, or preservatives If you or your partner are pregnant or trying to get pregnant Breast-feeding How should I use this medication? This medication is injected into a vein. It is administered by your care team in a hospital or clinic setting. Talk to your care team about the use of this medication in children. Special care may be needed. Overdosage: If you think you have taken too much of this medicine contact a poison control center or emergency room at once. NOTE: This medicine is only for you. Do not share this medicine with others. What if I miss a dose? Keep appointments for follow-up doses. It is important not to miss your dose. Call your care team if you are unable to keep an  appointment. What may interact with this medication? Do not take this medication with any of the following: Live virus vaccines This medication may also interact with the following: Medications that treat or prevent blood clots, such as warfarin, enoxaparin, dalteparin This list may not describe all possible interactions. Give your health care provider a list of all the medicines, herbs, non-prescription drugs, or dietary supplements you use. Also tell them if you smoke, drink alcohol, or use illegal drugs. Some items may interact with your medicine. What should I watch for while using this medication? Your condition will be monitored carefully while you are receiving this medication. This medication may make you feel generally unwell. This is not uncommon as chemotherapy can affect healthy cells as well as cancer cells. Report any side effects. Continue your course of treatment even though you feel ill unless your care team tells you to stop. In some cases, you may be given additional medications to help with side effects. Follow all directions for their use. This medication may increase your risk of getting an infection. Call your care team for advice if you get a fever, chills, sore throat, or other symptoms of a cold or flu. Do not treat yourself. Try to avoid being around people who are sick. This medication may increase your risk to bruise or bleed. Call your care team if you notice any unusual bleeding. Be careful brushing or flossing your teeth or using a toothpick because you may get an infection or bleed more easily. If you have any dental work done, tell your dentist you are receiving this medication. Avoid taking medications that contain aspirin, acetaminophen, ibuprofen, naproxen, or ketoprofen unless instructed by your care team. These medications may hide a fever. Do not treat diarrhea with over the counter products. Contact your care team if you have diarrhea that lasts more than 2  days or if it is severe and watery. This medication can make you more sensitive to the sun. Keep out of the sun. If you cannot avoid being in the sun, wear protective clothing and sunscreen. Do not use sun lamps, tanning beds, or tanning booths. Talk to your care team if you or your partner wish to become pregnant or think you might be pregnant. This medication can cause serious birth defects if  taken during pregnancy and for 3 months after the last dose. A reliable form of contraception is recommended while taking this medication and for 3 months after the last dose. Talk to your care team about effective forms of contraception. Do not father a child while taking this medication and for 3 months after the last dose. Use a condom while having sex during this time period. Do not breastfeed while taking this medication. This medication may cause infertility. Talk to your care team if you are concerned about your fertility. What side effects may I notice from receiving this medication? Side effects that you should report to your care team as soon as possible: Allergic reactions--skin rash, itching, hives, swelling of the face, lips, tongue, or throat Heart attack--pain or tightness in the chest, shoulders, arms, or jaw, nausea, shortness of breath, cold or clammy skin, feeling faint or lightheaded Heart failure--shortness of breath, swelling of the ankles, feet, or hands, sudden weight gain, unusual weakness or fatigue Heart rhythm changes--fast or irregular heartbeat, dizziness, feeling faint or lightheaded, chest pain, trouble breathing High ammonia level--unusual weakness or fatigue, confusion, loss of appetite, nausea, vomiting, seizures Infection--fever, chills, cough, sore throat, wounds that don't heal, pain or trouble when passing urine, general feeling of discomfort or being unwell Low red blood cell level--unusual weakness or fatigue, dizziness, headache, trouble breathing Pain, tingling, or  numbness in the hands or feet, muscle weakness, change in vision, confusion or trouble speaking, loss of balance or coordination, trouble walking, seizures Redness, swelling, and blistering of the skin over hands and feet Severe or prolonged diarrhea Unusual bruising or bleeding Side effects that usually do not require medical attention (report to your care team if they continue or are bothersome): Dry skin Headache Increased tears Nausea Pain, redness, or swelling with sores inside the mouth or throat Sensitivity to light Vomiting This list may not describe all possible side effects. Call your doctor for medical advice about side effects. You may report side effects to FDA at 1-800-FDA-1088. Where should I keep my medication? This medication is given in a hospital or clinic. It will not be stored at home. NOTE: This sheet is a summary. It may not cover all possible information. If you have questions about this medicine, talk to your doctor, pharmacist, or health care provider.  2023 Elsevier/Gold Standard (2021-08-09 00:00:00)  The chemotherapy medication bag should finish at 46 hours, 96 hours, or 7 days. For example, if your pump is scheduled for 46 hours and it was put on at 4:00 p.m., it should finish at 2:00 p.m. the day it is scheduled to come off regardless of your appointment time.     Estimated time to finish at 10:30 a.m. on Thursday 08/31/2022.   If the display on your pump reads "Low Volume" and it is beeping, take the batteries out of the pump and come to the cancer center for it to be taken off.   If the pump alarms go off prior to the pump reading "Low Volume" then call [phone] and someone can assist you.  If the plunger comes out and the chemotherapy medication is leaking out, please use your home chemo spill kit to clean up the spill. Do NOT use paper towels or other household products.  If you have problems or questions regarding your pump, please call either  [phone] (24 hours a day) or the cancer center Monday-Friday 8:00 a.m.- 4:30 p.m. at the clinic number and we will assist you. If you are unable  to get assistance, then go to the nearest Emergency Department and ask the staff to contact the IV team for assistance.

## 2022-08-30 ENCOUNTER — Telehealth: Payer: Self-pay | Admitting: Emergency Medicine

## 2022-08-30 NOTE — Telephone Encounter (Signed)
24 Hour Callback 24 Hour Callback post 1st time Vectibix infusion.  I spoke with patients son, his emergency contact. His son reports that patient had a red place on his head this morning but doesn't know if is is better or worse. There have been not other problems.  Patient is eating and drinking well. Son knows to call with any concerns or questions and will be here tomorrow for deaccess.

## 2022-08-31 ENCOUNTER — Inpatient Hospital Stay: Payer: Commercial Managed Care - HMO

## 2022-08-31 VITALS — BP 132/69 | HR 66 | Temp 97.6°F | Resp 20

## 2022-08-31 DIAGNOSIS — Z5111 Encounter for antineoplastic chemotherapy: Secondary | ICD-10-CM | POA: Diagnosis not present

## 2022-08-31 DIAGNOSIS — C189 Malignant neoplasm of colon, unspecified: Secondary | ICD-10-CM

## 2022-08-31 MED ORDER — SODIUM CHLORIDE 0.9% FLUSH
10.0000 mL | INTRAVENOUS | Status: DC | PRN
  Administered 2022-08-31: 10 mL

## 2022-08-31 MED ORDER — PEGFILGRASTIM-CBQV 6 MG/0.6ML ~~LOC~~ SOSY
6.0000 mg | PREFILLED_SYRINGE | Freq: Once | SUBCUTANEOUS | Status: AC
  Administered 2022-08-31: 6 mg via SUBCUTANEOUS
  Filled 2022-08-31: qty 0.6

## 2022-08-31 MED ORDER — HEPARIN SOD (PORK) LOCK FLUSH 100 UNIT/ML IV SOLN
500.0000 [IU] | Freq: Once | INTRAVENOUS | Status: AC | PRN
  Administered 2022-08-31: 500 [IU]

## 2022-08-31 NOTE — Patient Instructions (Signed)

## 2022-09-01 ENCOUNTER — Telehealth: Payer: Self-pay | Admitting: *Deleted

## 2022-09-01 NOTE — Telephone Encounter (Signed)
Patient left VM asking if he needs to continue the doxycycline? Called back and left him VM to continue this medication till Dr. Truett Perna tells him to stop. It is to try and minimize the rash he can get from his treatment.

## 2022-09-06 ENCOUNTER — Other Ambulatory Visit: Payer: Self-pay | Admitting: Oncology

## 2022-09-12 ENCOUNTER — Encounter: Payer: Self-pay | Admitting: Oncology

## 2022-09-12 ENCOUNTER — Inpatient Hospital Stay: Payer: Commercial Managed Care - HMO

## 2022-09-12 ENCOUNTER — Encounter: Payer: Self-pay | Admitting: Nurse Practitioner

## 2022-09-12 ENCOUNTER — Inpatient Hospital Stay (HOSPITAL_BASED_OUTPATIENT_CLINIC_OR_DEPARTMENT_OTHER): Payer: Commercial Managed Care - HMO | Admitting: Nurse Practitioner

## 2022-09-12 VITALS — BP 133/58 | HR 67 | Temp 98.1°F | Resp 18 | Ht 68.0 in | Wt 166.8 lb

## 2022-09-12 DIAGNOSIS — C189 Malignant neoplasm of colon, unspecified: Secondary | ICD-10-CM

## 2022-09-12 DIAGNOSIS — Z5111 Encounter for antineoplastic chemotherapy: Secondary | ICD-10-CM | POA: Diagnosis not present

## 2022-09-12 LAB — CBC WITH DIFFERENTIAL (CANCER CENTER ONLY)
Abs Immature Granulocytes: 0.05 10*3/uL (ref 0.00–0.07)
Basophils Absolute: 0.1 10*3/uL (ref 0.0–0.1)
Basophils Relative: 1 %
Eosinophils Absolute: 0.4 10*3/uL (ref 0.0–0.5)
Eosinophils Relative: 5 %
HCT: 42.4 % (ref 39.0–52.0)
Hemoglobin: 14 g/dL (ref 13.0–17.0)
Immature Granulocytes: 1 %
Lymphocytes Relative: 21 %
Lymphs Abs: 1.8 10*3/uL (ref 0.7–4.0)
MCH: 30.3 pg (ref 26.0–34.0)
MCHC: 33 g/dL (ref 30.0–36.0)
MCV: 91.8 fL (ref 80.0–100.0)
Monocytes Absolute: 0.9 10*3/uL (ref 0.1–1.0)
Monocytes Relative: 10 %
Neutro Abs: 5.5 10*3/uL (ref 1.7–7.7)
Neutrophils Relative %: 62 %
Platelet Count: 184 10*3/uL (ref 150–400)
RBC: 4.62 MIL/uL (ref 4.22–5.81)
RDW: 15.5 % (ref 11.5–15.5)
WBC Count: 8.7 10*3/uL (ref 4.0–10.5)
nRBC: 0 % (ref 0.0–0.2)

## 2022-09-12 LAB — CMP (CANCER CENTER ONLY)
ALT: 13 U/L (ref 0–44)
AST: 19 U/L (ref 15–41)
Albumin: 4.1 g/dL (ref 3.5–5.0)
Alkaline Phosphatase: 86 U/L (ref 38–126)
Anion gap: 8 (ref 5–15)
BUN: 15 mg/dL (ref 8–23)
CO2: 23 mmol/L (ref 22–32)
Calcium: 9.5 mg/dL (ref 8.9–10.3)
Chloride: 107 mmol/L (ref 98–111)
Creatinine: 0.92 mg/dL (ref 0.61–1.24)
GFR, Estimated: >60 mL/min (ref >60)
Glucose, Bld: 115 mg/dL — ABNORMAL HIGH (ref 70–99)
Potassium: 4.1 mmol/L (ref 3.5–5.1)
Sodium: 138 mmol/L (ref 135–145)
Total Bilirubin: 0.4 mg/dL (ref 0.3–1.2)
Total Protein: 6.6 g/dL (ref 6.5–8.1)

## 2022-09-12 LAB — CEA (ACCESS): CEA (CHCC): 16.92 ng/mL — ABNORMAL HIGH (ref 0.00–5.00)

## 2022-09-12 LAB — MAGNESIUM: Magnesium: 1.7 mg/dL (ref 1.7–2.4)

## 2022-09-12 MED ORDER — SODIUM CHLORIDE 0.9 % IV SOLN
6.0000 mg/kg | Freq: Once | INTRAVENOUS | Status: AC
  Administered 2022-09-12: 500 mg via INTRAVENOUS
  Filled 2022-09-12: qty 20

## 2022-09-12 MED ORDER — PALONOSETRON HCL INJECTION 0.25 MG/5ML
0.2500 mg | Freq: Once | INTRAVENOUS | Status: AC
  Administered 2022-09-12: 0.25 mg via INTRAVENOUS
  Filled 2022-09-12: qty 5

## 2022-09-12 MED ORDER — SODIUM CHLORIDE 0.9 % IV SOLN
2000.0000 mg/m2 | INTRAVENOUS | Status: DC
  Administered 2022-09-12: 3850 mg via INTRAVENOUS
  Filled 2022-09-12: qty 77

## 2022-09-12 MED ORDER — PEGFILGRASTIM-CBQV 6 MG/0.6ML ~~LOC~~ SOSY: 6.0000 mg | PREFILLED_SYRINGE | Freq: Once | SUBCUTANEOUS | 5 refills | Status: AC

## 2022-09-12 MED ORDER — SODIUM CHLORIDE 0.9 % IV SOLN: Freq: Once | INTRAVENOUS | Status: AC

## 2022-09-12 MED ORDER — SODIUM CHLORIDE 0.9 % IV SOLN
300.0000 mg/m2 | Freq: Once | INTRAVENOUS | Status: AC
  Administered 2022-09-12: 580 mg via INTRAVENOUS
  Filled 2022-09-12: qty 29

## 2022-09-12 MED ORDER — ATROPINE SULFATE 1 MG/ML IV SOLN
0.5000 mg | Freq: Once | INTRAVENOUS | Status: AC | PRN
  Administered 2022-09-12: 0.5 mg via INTRAVENOUS
  Filled 2022-09-12: qty 1

## 2022-09-12 MED ORDER — SODIUM CHLORIDE 0.9 % IV SOLN
10.0000 mg | Freq: Once | INTRAVENOUS | Status: AC
  Administered 2022-09-12: 10 mg via INTRAVENOUS
  Filled 2022-09-12: qty 10

## 2022-09-12 MED ORDER — FLUOROURACIL CHEMO INJECTION 2.5 GM/50ML
300.0000 mg/m2 | Freq: Once | INTRAVENOUS | Status: AC
  Administered 2022-09-12: 600 mg via INTRAVENOUS
  Filled 2022-09-12: qty 12

## 2022-09-12 MED ORDER — SODIUM CHLORIDE 0.9 % IV SOLN
180.0000 mg/m2 | Freq: Once | INTRAVENOUS | Status: AC
  Administered 2022-09-12: 340 mg via INTRAVENOUS
  Filled 2022-09-12: qty 15

## 2022-09-12 NOTE — Progress Notes (Signed)
John Vance from [facility] here at chairside interpreting for patient.

## 2022-09-12 NOTE — Telephone Encounter (Signed)
Notified by case manager that his treatment and one dose of Udenyca is covered to be given in [employer]. Other doses of Udenyca will need to be administered at home via [facility]. Fax [phone] Phone [phone]

## 2022-09-12 NOTE — Progress Notes (Signed)
[facility] OFFICE PROGRESS NOTE   Diagnosis: Colon cancer  INTERVAL HISTORY:   John Vance is seen for follow-up.  He completed cycle 1 FOLFIRI/Panitumumab 08/29/2022.  He had mild nausea for few days.  No mouth sores.  No diarrhea.  He notes a rash on his face and chest.  He is applying udder cream.  His stomach feels "heavy" after taking a dose of doxycycline.  He notes improvement since beginning Nexium.  Objective:  Vital signs in last 24 hours: Temperature 98.1, heart rate 88, respirations 18, blood pressure 133/71     HEENT: No thrush or ulcers. Resp: Lungs clear bilaterally. Cardio: Regular rate and rhythm. GI: No hepatomegaly. Vascular: No leg edema. Skin: Faint erythema bilateral malar region.  Scattered acne type lesions anterior upper chest. Port-A-Cath without erythema.  Lab Results:  Lab Results  Component Value Date   WBC 8.7 09/12/2022   HGB 14.0 09/12/2022   HCT 42.4 09/12/2022   MCV 91.8 09/12/2022   PLT 184 09/12/2022   NEUTROABS 5.5 09/12/2022    Imaging:  No results found.  Medications: I have reviewed the patient's current medications.  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), transverse colon cancer stage IIB (T4aN0) status post extended right hemicolectomy 05/07/2019, MSS Tumor invasive superficial portion of the muscle ("internal third "), no tumor perforation, no vascular invasion or perineural invasion, negative resection margins, 0/4 lymph nodes Elevated CEA 02/10/2019 CTs 03/05/2019, compared to outside CTs from 11/22/2018-worsened wall thickening in the cecum stable apple core lesion in the mid transverse colon, no evidence of recurrent tumor at the distal colonic anastomosis, no evidence of metastatic disease Colonoscopy 03/24/2019 20-2 malignant appearing masses noted in the colon, 1 filled the cecum measuring  approximately 4 cm across and friable.  The other malignant appearing mass was circumferential creating a stricture with a 1.2 cm lumen located in the transverse segment approximately 4 to 5 cm in length.  There were 12-18 neoplastic appearing polyps in between the 2 obvious malignant masses ranging in size from 3 mm to 2 cm.  5 polyps distal to the transverse colon mass.  2 sigmoid colon polyps.  Cecum mass positive for adenocarcinoma.  Transverse colon mass positive for adenocarcinoma. Foundation 1 transverse colon-MSS, tumor mutation burden 7, KRAS wild-type, BRAF fusion 05/07/2019 laparoscopic extended right hemicolectomy, lysis of adhesions-5 cm invasive moderately differentiated adenocarcinoma involving cecum and proximal ascending colon, carcinoma invades into the pericolonic soft tissue; 7 cm invasive moderately differentiated carcinoma involving the transverse colon, carcinoma invades into the serosal surface (visceral peritoneum); all resection margins negative for carcinoma.  Lymphovascular invasion present.  In the proximal colon metastatic carcinoma involves 3 of 17 lymph nodes with 1 tumor deposit.  In the transverse colon 12 lymph nodes negative for metastatic carcinoma.  3 separate polyps: Tubular adenoma, inflammatory polyp and hyperplastic polyp Cycle 1 capecitabine 06/02/2019 Cycle 2 capecitabine 06/23/2019 Cycle 3 capecitabine 07/14/2019 Cycle 4 capecitabine 08/04/2019 Cycle 5 capecitabine 08/25/2019 Cycle 6 capecitabine 09/15/2019 Cycle 7 capecitabine 10/06/2019 Cycle 8 capecitabine 10/27/2019 Upper endoscopy 07/23/2020-negative Colonoscopy 07/23/2020-normal-appearing anastomoses, polyp removed from the descending colon-tubular adenoma CTs 01/18/2021-right abdominal mesenteric mass, mass in the low anatomic pelvis consistent with metastases Cycle 1 FOLFOX 02/21/2021 Cycle 2 FOLFOX 03/07/2021, dose reductions made due to mild neutropenia Cycle 3 FOLFOX 03/21/2021 Cycle 4 FOLFOX 04/04/2021 Cycle 5  FOLFOX 04/19/2021 Cycle 6 FOLFOX 05/02/2021 CTs 05/12/2021-decrease size of nodular right  mesenteric mass, complete resolution of soft tissue enhancing nodule in the right inguinal canal, resolution of nodular soft tissue mass posterior to the bladder, enlargement of a left upper lobe nodule and new 4 mm left upper lobe nodule-indeterminate Cycle 7 FOLFOX 05/16/2021 Cycle 8 FOLFOX 05/30/2021, oxaliplatin and 5-FU bolus held due to neuropathy and thrombocytopenia Cycle 9 FOLFOX 06/13/2021, oxaliplatin dose reduced Cycle 10 FOLFOX 06/27/2021 CT 07/07/2021-decrease in right mesenteric mass, decreased size of lobulated left upper lobe nodule, resolution of previous "new "left upper lobe nodule, new area of wall thickening at the distal transverse colon Treatment changed to Xeloda 2 weeks on/1 week off beginning 07/18/2021 Cycle 2 Xeloda beginning 08/09/2021 Cycle 3 Xeloda beginning 08/30/2021 Cycle 4 Xeloda beginning 09/21/2021 Cycle 5 Xeloda beginning 10/12/2021 Cycle 6 Xeloda beginning 11/02/2021 CTs 11/14/2021-right lower quadrant spiculated small bowel mesenteric mass measured 2.8 x 3.7 cm, previously 1.0 x 2.3 cm; adjacent mesenteric haziness and nodularity measuring up to 8 mm superiorly.  New 4 mm nodular lesion posterior segment right upper lobe along the major fissure. Treatment break beginning 11/17/2021 CTs 03/21/2022-multiple new liver metastases, enlarging implant in the right ileocolic mesentery, enlarging peritoneal implants in the low pelvis Cycle 1 FOLFIRI/Avastin 03/29/2022 Cycle 2 FOLFIRI/Avastin 04/11/2022 Cycle 3 FOLFIRI/Avastin 04/26/2022 Cycle 4 FOLFIRI/Avastin 05/10/2022 Cycle 5 FOLFIRI/Avastin 05/24/2022 CT abdomen/pelvis 06/02/2022-liver metastases are stable and smaller, smaller peritoneal/mesenteric nodules Cycle 6 FOLFIRI/Avastin 06/06/2022 Cycle 7 FOLFIRI/Avastin 06/20/2022 Cycle 8 FOLFIRI/Avastin 07/04/2022 Treatment held 07/18/2022 due to neutropenia Cycle 9 FOLFIRI/Avastin 07/24/2022,  Udenyca Cycle 10 FOLFIRI/Avastin 08/08/2022, Udenyca CTs 08/18/2022-slight enlargement of several liver lesions, right mesenteric mass and low pelvic soft tissue nodule are stable Cycle 1 FOLFIRI/Panitumumab 08/29/2022 Cycle 2 FOLFIRI/Panitumumab 09/12/2022 2.   Microcytic anemia secondary to #1.    Resolved 3.   Family history of pancreas and colon cancer, negative genetic testing per the [facility] panel, ATM variant of unknown significance 4.   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  He has completed 1 cycle of FOLFIRI/Panitumumab.  Overall tolerated well.  Has skin rash related to Panitumumab.  We discussed continuing udder cream as he is currently doing, including the face.  He will try over-the-counter hydrocortisone cream for any particularly bothersome areas.  He is having GI symptoms with doxycycline.  We discussed changing to minocycline.  He would like to continue the doxycycline for now.  Plan to proceed with cycle 2 FOLFIRI/Panitumumab today as scheduled.  CBC and chemistry panel reviewed.  Labs adequate to proceed as above.  He will return for lab, follow-up, cycle 3 FOLFIRI/Panitumumab in 2 weeks.  We are available to see him sooner if needed.   Lonna Cobb ANP/GNP-BC   09/12/2022  11:04 AM

## 2022-09-12 NOTE — Patient Instructions (Addendum)
Instrucciones al darle de alta: The chemotherapy medication bag should finish at 46 hours, 96 hours, or 7 days. For example, if your pump is scheduled for 46 hours and it was put on at 4:00 p.m., it should finish at 2:00 p.m. the day it is scheduled to come off regardless of your appointment time.     Estimated time to finish at 11:15 Thursday, Sep 14, 2022. .   If the display on your pump reads "Low Volume" and it is beeping, take the batteries out of the pump and come to the cancer center for it to be taken off.   If the pump alarms go off prior to the pump reading "Low Volume" then call [phone] and someone can assist you.  If the plunger comes out and the chemotherapy medication is leaking out, please use your home chemo spill kit to clean up the spill. Do NOT use paper towels or other household products.  If you have problems or questions regarding your pump, please call either [phone] (24 hours a day) or the cancer center Monday-Friday 8:00 a.m.- 4:30 p.m. at the clinic number and we will assist you. If you are unable to get assistance, then go to the nearest Emergency Department and ask the staff to contact the IV team for assistance.  Discharge Instructions Gracias por elegir al [facility] de Cncer de [place] para brindarle atencin mdica de oncologa y Teacher, English as a foreign language.   Si usted tiene una cita de laboratorio con [employer] de [place], por favor vaya directamente al [employer]uss de Cncer y regstrese en el rea de Engineer, maintenance (IT).   Use ropa cmoda y [place] para tener fcil acceso a las vas del Portacath (acceso venoso de Set designer duracin) o la lnea PICC (catter central colocado por va perifrica).   Nos esforzamos por ofrecerle tiempo de calidad con su proveedor. Es posible que tenga que volver a programar su cita si llega tarde (15 minutos o ms).  El llegar tarde le afecta a usted y a otros pacientes cuyas citas son posteriores a Armed forces operational officer.  Adems, si usted falta a tres o ms citas sin  avisar a la oficina, puede ser retirado(a) de la clnica a discrecin del proveedor.      Para las solicitudes de renovacin de recetas, pida a su farmacia que se ponga en contacto con nuestra oficina y deje que transcurran 72 horas para que se complete el proceso de las renovaciones.    Hoy usted recibi los siguientes agentes de quimioterapia e/o inmunoterapia  Vectibix, Irinotecan, Leucovorin, Fluorouracil.   Para ayudar a prevenir las nuseas y los vmitos despus de su tratamiento, le recomendamos que tome su medicamento para las nuseas segn las indicaciones.  LOS SNTOMAS QUE DEBEN COMUNICARSE INMEDIATAMENTE SE INDICAN A CONTINUACIN: *FIEBRE SUPERIOR A 100.4 F (38 C) O MS *ESCALOFROS O SUDORACIN *NUSEAS Y VMITOS QUE NO SE CONTROLAN CON EL MEDICAMENTO PARA LAS NUSEAS *DIFICULTAD INUSUAL PARA RESPIRAR  *MORETONES O HEMORRAGIAS NO HABITUALES *PROBLEMAS URINARIOS (dolor o ardor al Geographical information systems officer o frecuencia para Geographical information systems officer) *PROBLEMAS INTESTINALES (diarrea inusual, estreimiento, dolor cerca del ano) SENSIBILIDAD EN LA BOCA Y EN LA GARGANTA CON O SIN LA PRESENCIA DE LCERAS (dolor de garganta, llagas en la boca o dolor de muelas/dientes) ERUPCIN, HINCHAZN O DOLORES INUSUALES FLUJO VAGINAL INUSUAL O PICAZN/RASQUIA    Los puntos marcados con un asterisco ( *) indican una posible emergencia y debe hacer un seguimiento tan pronto como le sea posible o vaya al Departamento de Emergencias si se le presenta  algn problema.  Por favor, muestre la [place] DE ADVERTENCIA DE Marc Morgans DE ADVERTENCIA DE Gardiner Fanti al registrarse en [address] de Emergencias y a la enfermera de triaje.  Si tiene preguntas despus de su visita o necesita cancelar o volver a programar su cita, por favor pngase en contacto con [facility]  Dept: [phone]  y siga las instrucciones. Las horas de oficina son de 8:00 a.m. a 4:30 p.m. de lunes a viernes. Por  favor, tenga en cuenta que los mensajes de voz que se dejan despus de las 4:00 p.m. posiblemente no se devolvern hasta el siguiente da de [place].  Cerramos los fines de semana y [employer]. En todo momento tiene acceso a una enfermera para preguntas urgentes. Por favor, llame al nmero principal de la clnica Dept: [phone] y siga las instrucciones.   Para cualquier pregunta que no sea de carcter urgente, tambin puede ponerse en contacto con su proveedor [employer] and [employer]. Ahora ofrecemos visitas electrnicas para cualquier persona mayor de 18 aos que solicite atencin mdica en lnea para los sntomas que no sean urgentes. Para ms detalles vaya a [email or web address].   Tambin puede bajar la aplicacin de MyChart! Vaya a la tienda de aplicaciones, busque "MyChart", abra la aplicacin, seleccione [place], e ingrese con su nombre de usuario y la contrasea de Clinical cytogeneticist.  Panitumumab Injection Qu es este medicamento? El PANITUMUMAB trata el cncer colorrectal. Acta bloqueando una protena que hace que las clulas cancerosas crezcan y se multipliquen. Esto ayuda a Neurosurgeon propagacin de las clulas cancerosas. Es un anticuerpo monoclonal. Cleda Clarks medicamento puede ser utilizado para otros usos; si tiene alguna pregunta consulte con su proveedor de atencin mdica o con su farmacutico. MARCAS COMUNES: Vectibix Qu le debo informar a mi profesional de la salud antes de tomar este medicamento? Necesitan saber si usted presenta alguno de los siguientes problemas o situaciones: Enfermedad ocular Niveles bajos de magnesio en la sangre Enfermedad pulmonar Una reaccin alrgica o inusual al panitumumab, a otros medicamentos, alimentos, colorantes o conservantes Si est embarazada o buscando quedar embarazada Si est amamantando a un beb Cmo debo [employer]? Este medicamento se inyecta en una vena. Su equipo de atencin lo [employer]  en un hospital o en un entorno clnico. Hable con su equipo de atencin sobre el uso de este medicamento en nios. Puede requerir atencin especial. Sobredosis: Pngase en contacto inmediatamente con un centro toxicolgico o una sala de urgencia si usted cree que haya tomado demasiado medicamento. ATENCIN: [employer] es solo para usted. No comparta este medicamento con nadie. Qu sucede si me olvido de una dosis? Cumpla con las citas para dosis de seguimiento. Es importante no olvidar ninguna dosis. Llame a su equipo de atencin si no puede asistir a una cita. Qu puede interactuar con este medicamento? Bevacizumab Puede ser que esta lista no menciona todas las posibles interacciones. Informe a su profesional de [employer] de In[employer] Inc productos a base de hierbas, medicamentos de [place] o suplementos nutritivos que est tomando. Si usted fuma, consume bebidas alcohlicas o si utiliza drogas ilegales, indqueselo tambin a su profesional de [employer]. Algunas sustancias pueden interactuar con su medicamento. A qu debo estar atento al usar [employer]? Se supervisar su estado de salud atentamente mientras reciba este medicamento. Este medicamento podra hacerle sentir un Risk analyst. Esto no es inusual, ya que la quimioterapia puede afectar tanto a las clulas sanas como a  las clulas cancerosas. Si presenta algn efecto secundario, infrmelo. Contine con el tratamiento incluso si se siente enfermo, a menos que su equipo de [employer] lo suspenda. Este medicamento puede aumentar su sensibilidad al sol. Evite exponerse a la Environmental manager recibe [employer] Corporation y [place] 2 meses despus de Customer service manager. Si no la Network engineer, utilice ropa protectora y crema de Orthoptist. No utilice lmparas solares, camas solares ni cabinas solares. Consulte con su equipo de atencin si tiene diarrea grave, nuseas y vmitos, o sudoracin intensa. La prdida de  demasiado lquido corporal podra hacer que sea peligroso usar [employer]. Este medicamento podra causar reacciones graves en la piel. Pueden presentarse semanas a meses despus de comenzar a Astronomer. Contacte a su equipo de atencin de inmediato si nota que tiene fiebre o sntomas gripales con una erupcin. La erupcin puede ser roja o Clarisa Fling, y luego puede convertirse en ampollas o descamacin de la piel. Tambin podra observar una erupcin roja con hinchazn en la cara, los labios o los ganglios linfticos en el cuello o debajo de los brazos. Hable con su equipo de atencin si podra estar embarazada. Este medicamento puede causar defectos congnitos graves si se [place] durante el embarazo y por 2 meses despus de la ltima dosis. Se recomienda utilizar un mtodo anticonceptivo mientras est usando este medicamento y por 2 meses despus de la ltima dosis. Su equipo de atencin mdica puede ayudarle a Clinical research associate la opcin que mejor se adapte a sus necesidades. No debe amamantar a un beb mientras [place] este medicamento y por 2 meses despus de la ltima dosis. Este medicamento podra causar infertilidad. Hable con su equipo de atencin si le preocupa su fertilidad. Qu efectos secundarios puedo tener al [employer] este medicamento? Efectos secundarios que debe informar a su equipo de atencin tan pronto como sea posible: Reacciones alrgicas: erupcin cutnea, comezn/picazn, urticaria, hinchazn de la cara, los labios, la lengua o la garganta Tos seca, falta de aire o problemas para Dietitian, enrojecimiento, irritacin o secrecin de los ojos con visin borrosa o disminuida Reacciones a la infusin: Journalist, newspaper, falta de aire o dificultad para respirar, sensacin de desmayo o aturdimiento Nivel bajo de magnesio: dolor o calambres musculares, debilidad o fatiga inusuales, frecuencia cardiaca rpida o irregular, temblores Nivel bajo de potasio: dolor o calambres musculares,  debilidad o fatiga inusuales, frecuencia cardiaca rpida o irregular, estreimiento Enrojecimiento, formacin de ampollas, descamacin o distensin de la piel, incluso dentro de la boca Reacciones cutneas en las zonas expuestas al sol Efectos secundarios que generalmente no requieren atencin mdica (debe informarlos a su equipo de atencin si persisten o si son molestos): Cambio en la forma, grosor o color de las uas Diarrea Piel seca Fatiga Nuseas Vmito Puede ser que esta lista no menciona todos los posibles efectos secundarios. Comunquese a su mdico por asesoramiento mdico [employer]lett[employer]ard. Usted puede informar los efectos secundarios a la FDA por telfono al 1-800-FDA-1088. Dnde debo guardar mi medicina? Este medicamento se administra en hospitales o clnicas. No se guarda en su casa. ATENCIN: Este folleto es un resumen. Puede ser que no cubra toda la posible informacin. Si usted tiene preguntas acerca de esta medicina, consulte con su mdico, su farmacutico o su profesional de Radiographer, therapeutic.  2023 Elsevier/Gold Standard (2022-03-01 00:00:00) Irinotecan Injection Qu es este medicamento? El IRINOTECN trata algunos tipos de cncer. Acta desacelerando el crecimiento de clulas cancerosas. Este medicamento puede ser utilizado para  otros usos; si tiene alguna pregunta consulte con su proveedor de atencin mdica o con su farmacutico. MARCAS COMUNES: Camptosar Qu le debo informar a mi profesional de la salud antes de tomar este medicamento? Necesitan saber si usted presenta alguno de los [facility] o situaciones: Deshidratacin Diarrea Infeccin, especialmente una infeccin viral, tales como varicela, fuegos labiales, herpes Enfermedad heptica Niveles bajos de clulas sanguneas (glbulos blancos, glbulos rojos y plaquetas) Niveles bajos de Customer service manager, tales como calcio, magnesio o potasio en la sangre Radiacin en curso o reciente Una reaccin  alrgica o inusual al irinotecn, a otros medicamentos, alimentos, colorantes o conservantes Si usted o su pareja est embarazada o intentando quedar embarazada Si est amamantando a un beb Cmo debo Visual merchandiser medicamento? Este medicamento se inyecta en una vena. Su equipo de atencin lo [employer] en un hospital o en un entorno clnico. Hable con su equipo de atencin sobre el uso de este medicamento en nios. Puede requerir atencin especial. Sobredosis: Pngase en contacto inmediatamente con un centro toxicolgico o una sala de urgencia si usted cree que haya tomado demasiado medicamento. ATENCIN: [employer] American es solo para usted. No comparta este medicamento con nadie. Qu sucede si me olvido de una dosis? Cumpla con las citas para dosis de seguimiento. Es importante no olvidar ninguna dosis. Llame a su equipo de atencin si no puede asistir a una cita. Qu puede interactuar con este medicamento? No use este medicamento con ninguno de los siguientes productos: Cobicistat Itraconazol Este medicamento tambin podra interactuar con los siguientes productos: Ciertos antibiticos, tales como claritromicina, rifampicina, rifabutina Ciertos medicamentos antivirales para el VIH o SIDA Ciertos medicamentos para las infecciones micticas, tales como ketoconazol, posaconazol, voriconazol Ciertos medicamentos para convulsiones, tales como [place], fenobarbital, fenitona Gemfibrozil Nefazodona Hierba de [address] ser que esta lista no menciona todas las posibles interacciones. Informe a su profesional de [employer] de In[employer] Inc productos a base de hierbas, medicamentos de [place] o suplementos nutritivos que est tomando. Si usted fuma, consume bebidas alcohlicas o si utiliza drogas ilegales, indqueselo tambin a su profesional de [employer]. Algunas sustancias pueden interactuar con su medicamento. A qu debo estar atento al usar [employer]? Se supervisar su estado  de salud atentamente mientras reciba este medicamento. Usted podra necesitar realizarse [employer] de sangre mientras est usando [place]. Este medicamento podra hacerle sentir un Risk analyst. Esto no es inusual, ya que la quimioterapia puede afectar tanto a las clulas sanas como a las clulas cancerosas. Si presenta algn efecto secundario, infrmelo. Contine con el tratamiento incluso si se siente enfermo, a menos que su equipo de [employer] lo suspenda. Este medicamento puede causar efectos secundarios graves. Para reducir Nurse, adult, su equipo de atencin puede darle otros medicamentos que deber usar antes de Regulatory affairs officer. Asegrese de seguir las instrucciones de su equipo de atencin. Este medicamento podra afectar su coordinacin, tiempo de reaccin o juicio. No conduzca ni opere maquinaria pesada hasta que sepa cmo le afecta este medicamento. Pngase de pie o levntese lentamente para reducir el riesgo de mareos o [place]. Beber alcohol con [employer] puede aumentar el riesgo de estos efectos secundarios. Este medicamento puede aumentar su riesgo de contraer una infeccin. Llame para pedir consejo a su equipo de atencin si tiene fiebre, escalofros, dolor de garganta o cualquier otro sntoma de resfriado o gripe. No se trate usted mismo. Trate de no acercarse a personas que estn enfermas. Evite usar medicamentos que contengan aspirina, acetaminofeno, ibuprofeno, naproxeno  o ketoprofeno, a menos que as lo indique su equipo de atencin. Estos medicamentos pueden ocultar la fiebre. Este medicamento podra aumentar el riesgo de moretones o sangrado. Llame a su equipo de atencin si observa sangrados inusuales. Proceda con cuidado al cepillar sus dientes, usar hilo dental o Chemical engineer palillos para los dientes, ya que podra contraer una infeccin o Geophysicist/field seismologist con mayor facilidad. Si recibe algn tratamiento dental, informe a su dentista que est [employer]. Informe a su equipo de atencin si usted o su pareja est embarazada o si cree que alguna de las [place] podra [address]. Este medicamento puede causar defectos congnitos graves si se [place] durante el embarazo y por 6 meses despus de la ltima dosis. Deber realizarse una prueba de embarazo y obtener resultado negativo antes de Games developer a Producer, television/film/video. Se recomienda utilizar un mtodo anticonceptivo mientras est usando este medicamento y por 6 meses despus de la ltima dosis. Su equipo de atencin mdica puede ayudarle a Clinical research associate la opcin que mejor se adapte a sus necesidades. No embarace a Careers information officer est usando este medicamento y durante 3 meses despus de la ltima dosis. Use un condn como anticonceptivo [employer]. No debe amamantar a un beb mientras [place] este medicamento y por 7 das despus de la ltima dosis. Este medicamento podra causar infertilidad. Hable con su equipo de atencin si le preocupa su fertilidad. Qu efectos secundarios puedo tener al [employer] este medicamento? Efectos secundarios que debe informar a su equipo de atencin tan pronto como sea posible: Reacciones alrgicas: erupcin cutnea, comezn/picazn, urticaria, hinchazn de la cara, los labios, la lengua o la garganta Tos seca, falta de aire o problemas para respirar Aumento de saliva o lgrimas, aumento de la sudoracin, calambres estomacales, diarrea, pupilas pequeas, debilidad o fatiga inusuales, frecuencia cardiaca lenta Infeccin: fiebre, escalofros, tos, dolor de garganta, heridas que no sanan, dolor o problemas para Geographical information systems officer, sensacin general de molestia o malestar [employer] riones: disminucin en la cantidad de orina, hinchazn de los tobillos, las manos o los pies Recuento bajo de glbulos rojos: debilidad o fatiga inusuales, Psychiatrist, Engineer, mining de cabeza, dificultad para respirar Diarrea grave o prolongada Sangrado o moretones inusuales Efectos secundarios que  generalmente no requieren atencin mdica (debe informarlos a su equipo de atencin si persisten o si son molestos): Estreimiento Diarrea Cada del cabello Prdida del apetito Nuseas Dolor estomacal Puede ser que esta lista no menciona todos los posibles efectos secundarios. Comunquese a su mdico por asesoramiento mdico [employer]lett[employer]ard. Usted puede informar los efectos secundarios a la FDA por telfono al 1-800-FDA-1088. Dnde debo guardar mi medicina? Este medicamento se administra en hospitales o clnicas. No se guarda en su casa. ATENCIN: Este folleto es un resumen. Puede ser que no cubra toda la posible informacin. Si usted tiene preguntas acerca de esta medicina, consulte con su mdico, su farmacutico o su profesional de Radiographer, therapeutic.  2023 Elsevier/Gold Standard (2022-03-01 00:00:00) Leucovorin Injection Qu es este medicamento? La [place] previene los efectos secundarios de ciertos medicamentos, Consulting civil engineer. Acta [employer] niveles de folato. Esto ayuda a proteger las clulas sanas del cuerpo. Tambin podra usarse para tratar la anemia causada por niveles bajos de folato. Tambin se puede Chemical engineer con fluorouracilo, un tipo de quimioterapia, para Tax adviser. Acta [employer] del fluorouracilo en el cuerpo. Este medicamento puede ser utilizado para otros usos; si tiene alguna pregunta consulte con su proveedor de atencin mdica o con su  farmacutico. Qu le debo informar a mi profesional de la salud antes de tomar este medicamento? Necesitan saber si usted presenta alguno de los [facility] o situaciones: Anemia por niveles bajos de vitamina B12 en la sangre Una reaccin alrgica o inusual a la leucovorina, al cido flico, a otros medicamentos, alimentos, colorantes o conservantes Si est embarazada o buscando quedar embarazada Si est amamantando a un beb Cmo debo [employer]? Este  medicamento se inyecta en una vena o un msculo. Su equipo de atencin lo [employer] en un hospital o en un entorno clnico. Hable con su equipo de atencin sobre el uso de este medicamento en nios. Puede requerir atencin especial. Sobredosis: Pngase en contacto inmediatamente con un centro toxicolgico o una sala de urgencia si usted cree que haya tomado demasiado medicamento. ATENCIN: [employer] es solo para usted. No comparta este medicamento con nadie. Qu sucede si me olvido de una dosis? Cumpla con las citas para dosis de seguimiento. Es importante no olvidar ninguna dosis. Llame a su equipo de atencin si no puede asistir a una cita. Qu puede interactuar con este medicamento? Capecitabina Fluorouracilo Fenobarbital Fenitona Primidona Trimetoprima;sulfametoxasol Puede ser que esta lista no menciona todas las posibles interacciones. Informe a su profesional de [employer] de [employer] Inc productos a base de hierbas, medicamentos de [place] o suplementos nutritivos que est tomando. Si usted fuma, consume bebidas alcohlicas o si utiliza drogas ilegales, indqueselo tambin a su profesional de [employer]. Algunas sustancias pueden interactuar con su medicamento. A qu debo estar atento al usar [employer]? Se supervisar su estado de salud atentamente mientras reciba este medicamento. Este medicamento podra aumentar los efectos secundarios del 5-fluorouracilo. Informe a su equipo de atencin si tiene diarrea o llagas en la boca que no mejoran o empeoran. Qu efectos secundarios puedo tener al [employer] este medicamento? Efectos secundarios que debe informar a su equipo de atencin tan pronto como sea posible: Reacciones alrgicas: erupcin cutnea, comezn/picazn, urticaria, hinchazn de la cara, los labios, la lengua o la garganta Puede ser que esta lista no menciona todos los posibles efectos secundarios. Comunquese a su mdico por asesoramiento mdico [employer]. Usted puede informar los efectos secundarios a la FDA por telfono al 1-800-FDA-1088. Dnde debo guardar mi medicina? Este medicamento se administra en hospitales o clnicas. No se guarda en su casa. ATENCIN: Este folleto es un resumen. Puede ser que no cubra toda la posible informacin. Si usted tiene preguntas acerca de esta medicina, consulte con su mdico, su farmacutico o su profesional de Radiographer, therapeutic.  2023 Elsevier/Gold Standard (2022-03-01 00:00:00) Fluorouracil Injection Qu es este medicamento? El FLUOROURACILO trata algunos tipos de cncer. Acta desacelerando el crecimiento de clulas cancerosas. Este medicamento puede ser utilizado para otros usos; si tiene alguna pregunta consulte con su proveedor de atencin mdica o con su farmacutico. MARCAS COMUNES: Adrucil Qu le debo informar a mi profesional de la salud antes de tomar este medicamento? Necesitan saber si usted presenta alguno de los siguientes problemas o situaciones: Trastornos sanguneos Deficiencia de dihidropirimidina deshidrogenasa (DPD) Infecciones, tales como varicela, fuegos labiales, herpes Enfermedad renal Enfermedad heptica Desnutricin [place] de radiacin en curso o reciente [place] reaccin alrgica o inusual al fluorouracilo, a otros medicamentos, alimentos, colorantes o conservantes Si usted o su pareja est embarazada o intentando quedar embarazada Si est amamantando a un beb Cmo debo Visual merchandiser medicamento? Este medicamento se inyecta en una vena. Su equipo de atencin lo [place] en un hospital o en  un entorno clnico. Hable con su equipo de atencin sobre el uso de este medicamento en nios. Puede requerir atencin especial. Sobredosis: Pngase en contacto inmediatamente con un centro toxicolgico o una sala de urgencia si usted cree que haya tomado demasiado medicamento. ATENCIN: [employer] es solo para usted. No comparta este medicamento con nadie. Qu sucede si me  olvido de una dosis? Cumpla con las citas para dosis de seguimiento. Es importante no olvidar ninguna dosis. Llame a su equipo de atencin si no puede asistir a una cita. Qu puede interactuar con este medicamento? No use este medicamento con ninguno de los siguientes productos: Administrator, arts de virus vivos Este medicamento tambin podra Product/process development scientist con los siguientes productos: Medicamentos que tratan o previenen cogulos sanguneos, tales como warfarina, enoxaparina, dalteparina Puede ser que esta lista no menciona todas las posibles interacciones. Informe a su profesional de [employer] de [employer] Inc productos a base de hierbas, medicamentos de [place] o suplementos nutritivos que est tomando. Si usted fuma, consume bebidas alcohlicas o si utiliza drogas ilegales, indqueselo tambin a su profesional de [employer]. Algunas sustancias pueden interactuar con su medicamento. A qu debo estar atento al usar [employer]? Se supervisar su estado de salud atentamente mientras reciba este medicamento. Este medicamento podra hacerle sentir un Risk analyst. Esto no es inusual, ya que la quimioterapia puede afectar tanto a las clulas sanas como a las clulas cancerosas. Si presenta algn efecto secundario, infrmelo. Contine con el tratamiento incluso si se siente enfermo, a menos que su equipo de [employer] lo suspenda. En algunos casos, podra recibir [employer] para ayudarle con los efectos secundarios. Siga todas las instrucciones para usarlos. Este medicamento puede aumentar su riesgo de contraer una infeccin. Llame para pedir consejo a su equipo de atencin si tiene fiebre, escalofros, dolor de garganta o cualquier otro sntoma de resfriado o gripe. No se trate usted mismo. Trate de no acercarse a personas que estn enfermas. Este medicamento podra aumentar el riesgo de moretones o sangrado. Llame a su equipo de atencin si observa sangrados inusuales. Proceda con  cuidado al cepillar sus dientes, usar hilo dental o Chemical engineer palillos para los dientes, ya que podra contraer una infeccin o Geophysicist/field seismologist con mayor facilidad. Si recibe algn tratamiento dental, informe a su dentista que est [employer]. Evite usar medicamentos que contengan aspirina, acetaminofeno, ibuprofeno, naproxeno o ketoprofeno, a menos que as lo indique su equipo de atencin. Estos medicamentos pueden ocultar la fiebre. No trate la diarrea con productos de [employer]. Contacte a su equipo de atencin si tiene diarrea por ms de [address], o si es grave y [place]. Este medicamento puede aumentar su sensibilidad al sol. Evite la [employer]. Si no la Network engineer, utilice ropa protectora y crema de Orthoptist. No utilice lmparas solares, camas solares ni cabinas solares. Informe a su equipo de atencin si usted o su pareja est buscando un embarazo o si cree que podra estar embarazada. Este medicamento puede causar defectos congnitos graves si se [place] durante el embarazo y por 3 meses despus de la ltima dosis. Se recomienda Chemical engineer un mtodo anticonceptivo confiable mientras est usando este medicamento y durante 3 meses despus de la ltima dosis. Hable con su equipo de atencin sobre mtodos anticonceptivos eficaces. No embarace a Careers information officer est usando este medicamento y durante 3 meses despus de la ltima dosis. Use un condn durante las relaciones sexuales que mantenga en [place]. No debe amamantar a un  beb mientras est [employer]. Este medicamento podra causar infertilidad. Hable con su equipo de atencin si le preocupa su fertilidad. Qu efectos secundarios puedo tener al [employer] este medicamento? Efectos secundarios que debe informar a su equipo de atencin tan pronto como sea posible: Reacciones alrgicas: erupcin cutnea, comezn/picazn, urticaria, hinchazn de la cara, los labios, la lengua o la garganta Ataque cardiaco: Engineer, mining u opresin  en el pecho, los hombros, los brazos o la [place], nuseas, falta de [place], piel fra o sudorosa, sensacin de [place] o aturdimiento Insuficiencia cardiaca: falta de aire, hinchazn de los tobillos, los pies o las manos, aumento de peso repentino, debilidad o fatiga inusuales Cambios en el ritmo cardiaco: frecuencia cardiaca rpida o irregular, mareos, sensacin de desmayo o aturdimiento, dolor en el pecho, dificultad para respirar Niveles elevados de amonaco: debilidad o fatiga inusuales, confusin, prdida de apetito, nuseas, vmitos, convulsiones Infeccin: fiebre, escalofros, tos, dolor de garganta, heridas que no sanan, dolor o problemas para Geographical information systems officer, sensacin general de molestia o malestar Recuento bajo de glbulos rojos: debilidad o fatiga inusuales, mareo, dolor de cabeza, dificultad para Dietitian, hormigueo o entumecimiento en las manos o los pies, debilidad muscular, cambios en la visin, confusin o dificultad para hablar, prdida del equilibrio o la coordinacin, dificultad para caminar, convulsiones Enrojecimiento, hinchazn y [employer] en la piel de las manos y los pies Diarrea grave o prolongada Sangrado o moretones inusuales Efectos secundarios que generalmente no requieren atencin mdica (debe informarlos a su equipo de atencin si persisten o si son molestos): Piel seca Dolor de cabeza Aumento de Armed forces technical officer, enrojecimiento o hinchazn con llagas dentro de la boca o la garganta Sensibilidad a la luz [place] Puede ser que esta lista no menciona todos los posibles efectos secundarios. Comunquese a su mdico por asesoramiento mdico [employer]. Usted puede informar los efectos secundarios a la FDA por telfono al 1-800-FDA-1088. Dnde debo guardar mi medicina? Este medicamento se administra en hospitales o clnicas. No se guarda en su casa. ATENCIN: Este folleto es un resumen. Puede ser que no cubra toda la posible informacin. Si usted  tiene preguntas acerca de esta medicina, consulte con su mdico, su farmacutico o su profesional de Radiographer, therapeutic.  2023 Elsevier/Gold Standard (2022-03-01 00:00:00)

## 2022-09-12 NOTE — Progress Notes (Signed)
Patient's son wanted to speak with someone regarding some of patients complaints. He is having stomach issues with the antibiotics and the rash on his face. He has been using prescribed cream on his ribs where he has had rash. Patient has erythema and rash on face but has not been using the cream on his face. Junius Roads, RN assisting Dr Truett Perna notified.

## 2022-09-13 ENCOUNTER — Other Ambulatory Visit: Payer: Self-pay

## 2022-09-14 ENCOUNTER — Inpatient Hospital Stay: Payer: Commercial Managed Care - HMO

## 2022-09-14 VITALS — BP 127/59 | HR 70 | Temp 97.6°F | Resp 18

## 2022-09-14 DIAGNOSIS — Z5111 Encounter for antineoplastic chemotherapy: Secondary | ICD-10-CM | POA: Diagnosis not present

## 2022-09-14 DIAGNOSIS — C189 Malignant neoplasm of colon, unspecified: Secondary | ICD-10-CM

## 2022-09-14 MED ORDER — HEPARIN SOD (PORK) LOCK FLUSH 100 UNIT/ML IV SOLN
500.0000 [IU] | Freq: Once | INTRAVENOUS | Status: AC | PRN
  Administered 2022-09-14: 500 [IU]

## 2022-09-14 MED ORDER — PEGFILGRASTIM-CBQV 6 MG/0.6ML ~~LOC~~ SOSY
6.0000 mg | PREFILLED_SYRINGE | Freq: Once | SUBCUTANEOUS | Status: AC
  Administered 2022-09-14: 6 mg via SUBCUTANEOUS
  Filled 2022-09-14: qty 0.6

## 2022-09-14 MED ORDER — SODIUM CHLORIDE 0.9% FLUSH
10.0000 mL | INTRAVENOUS | Status: DC | PRN
  Administered 2022-09-14: 10 mL

## 2022-09-14 NOTE — Patient Instructions (Signed)

## 2022-09-19 ENCOUNTER — Telehealth: Payer: Self-pay | Admitting: *Deleted

## 2022-09-19 MED ORDER — PEGFILGRASTIM-CBQV 6 MG/0.6ML ~~LOC~~ SOSY: PREFILLED_SYRINGE | SUBCUTANEOUS | 6 refills | Status: DC

## 2022-09-19 NOTE — Telephone Encounter (Signed)
Called [facility] and confirmed that his script for [place] has been processed and is ready. Need patient to call to provide verbal consent on shipping. Made them aware that son will call due to patient has limited [place].

## 2022-09-24 ENCOUNTER — Other Ambulatory Visit: Payer: Self-pay | Admitting: Oncology

## 2022-09-24 DIAGNOSIS — C189 Malignant neoplasm of colon, unspecified: Secondary | ICD-10-CM

## 2022-09-25 ENCOUNTER — Other Ambulatory Visit: Payer: Self-pay | Admitting: *Deleted

## 2022-09-26 ENCOUNTER — Inpatient Hospital Stay: Payer: Commercial Managed Care - HMO

## 2022-09-26 ENCOUNTER — Inpatient Hospital Stay: Payer: Commercial Managed Care - HMO | Attending: Oncology | Admitting: Oncology

## 2022-09-26 ENCOUNTER — Encounter: Payer: Self-pay | Admitting: *Deleted

## 2022-09-26 VITALS — BP 154/66 | HR 83 | Temp 98.2°F | Resp 18 | Ht 68.0 in | Wt 166.1 lb

## 2022-09-26 VITALS — BP 135/63 | HR 68

## 2022-09-26 DIAGNOSIS — K1230 Oral mucositis (ulcerative), unspecified: Secondary | ICD-10-CM | POA: Insufficient documentation

## 2022-09-26 DIAGNOSIS — C184 Malignant neoplasm of transverse colon: Secondary | ICD-10-CM | POA: Insufficient documentation

## 2022-09-26 DIAGNOSIS — D696 Thrombocytopenia, unspecified: Secondary | ICD-10-CM | POA: Insufficient documentation

## 2022-09-26 DIAGNOSIS — C187 Malignant neoplasm of sigmoid colon: Secondary | ICD-10-CM | POA: Diagnosis not present

## 2022-09-26 DIAGNOSIS — G620 Drug-induced polyneuropathy: Secondary | ICD-10-CM | POA: Insufficient documentation

## 2022-09-26 DIAGNOSIS — Z5111 Encounter for antineoplastic chemotherapy: Secondary | ICD-10-CM | POA: Insufficient documentation

## 2022-09-26 DIAGNOSIS — Z800 Family history of malignant neoplasm of digestive organs: Secondary | ICD-10-CM | POA: Insufficient documentation

## 2022-09-26 DIAGNOSIS — C189 Malignant neoplasm of colon, unspecified: Secondary | ICD-10-CM

## 2022-09-26 DIAGNOSIS — C182 Malignant neoplasm of ascending colon: Secondary | ICD-10-CM | POA: Diagnosis not present

## 2022-09-26 DIAGNOSIS — L270 Generalized skin eruption due to drugs and medicaments taken internally: Secondary | ICD-10-CM | POA: Insufficient documentation

## 2022-09-26 DIAGNOSIS — C787 Secondary malignant neoplasm of liver and intrahepatic bile duct: Secondary | ICD-10-CM | POA: Diagnosis not present

## 2022-09-26 LAB — CMP (CANCER CENTER ONLY)
ALT: 13 U/L (ref 0–44)
AST: 19 U/L (ref 15–41)
Albumin: 3.8 g/dL (ref 3.5–5.0)
Alkaline Phosphatase: 95 U/L (ref 38–126)
Anion gap: 7 (ref 5–15)
BUN: 18 mg/dL (ref 8–23)
CO2: 23 mmol/L (ref 22–32)
Calcium: 9.1 mg/dL (ref 8.9–10.3)
Chloride: 107 mmol/L (ref 98–111)
Creatinine: 0.89 mg/dL (ref 0.61–1.24)
GFR, Estimated: >60 mL/min (ref >60)
Glucose, Bld: 110 mg/dL — ABNORMAL HIGH (ref 70–99)
Potassium: 4.3 mmol/L (ref 3.5–5.1)
Sodium: 137 mmol/L (ref 135–145)
Total Bilirubin: 0.3 mg/dL (ref 0.3–1.2)
Total Protein: 6.3 g/dL — ABNORMAL LOW (ref 6.5–8.1)

## 2022-09-26 LAB — CBC WITH DIFFERENTIAL (CANCER CENTER ONLY)
Abs Immature Granulocytes: 0.06 10*3/uL (ref 0.00–0.07)
Basophils Absolute: 0.1 10*3/uL (ref 0.0–0.1)
Basophils Relative: 1 %
Eosinophils Absolute: 0.4 10*3/uL (ref 0.0–0.5)
Eosinophils Relative: 3 %
HCT: 39 % (ref 39.0–52.0)
Hemoglobin: 12.9 g/dL — ABNORMAL LOW (ref 13.0–17.0)
Immature Granulocytes: 1 %
Lymphocytes Relative: 14 %
Lymphs Abs: 1.7 10*3/uL (ref 0.7–4.0)
MCH: 30.2 pg (ref 26.0–34.0)
MCHC: 33.1 g/dL (ref 30.0–36.0)
MCV: 91.3 fL (ref 80.0–100.0)
Monocytes Absolute: 1.2 10*3/uL — ABNORMAL HIGH (ref 0.1–1.0)
Monocytes Relative: 10 %
Neutro Abs: 8.6 10*3/uL — ABNORMAL HIGH (ref 1.7–7.7)
Neutrophils Relative %: 71 %
Platelet Count: 217 10*3/uL (ref 150–400)
RBC: 4.27 MIL/uL (ref 4.22–5.81)
RDW: 15.9 % — ABNORMAL HIGH (ref 11.5–15.5)
WBC Count: 11.9 10*3/uL — ABNORMAL HIGH (ref 4.0–10.5)
nRBC: 0 % (ref 0.0–0.2)

## 2022-09-26 LAB — MAGNESIUM: Magnesium: 1.6 mg/dL — ABNORMAL LOW (ref 1.7–2.4)

## 2022-09-26 MED ORDER — NYSTATIN 100000 UNIT/GM EX POWD: 1.0000 | Freq: Three times a day (TID) | CUTANEOUS | 0 refills | Status: DC

## 2022-09-26 MED ORDER — FLUOROURACIL CHEMO INJECTION 500 MG/10ML
200.0000 mg/m2 | Freq: Once | INTRAVENOUS | Status: AC
  Administered 2022-09-26: 400 mg via INTRAVENOUS
  Filled 2022-09-26: qty 8

## 2022-09-26 MED ORDER — SODIUM CHLORIDE 0.9 % IV SOLN
180.0000 mg/m2 | Freq: Once | INTRAVENOUS | Status: AC
  Administered 2022-09-26: 340 mg via INTRAVENOUS
  Filled 2022-09-26: qty 15

## 2022-09-26 MED ORDER — SODIUM CHLORIDE 0.9 % IV SOLN
200.0000 mg/m2 | Freq: Once | INTRAVENOUS | Status: AC
  Administered 2022-09-26: 386 mg via INTRAVENOUS
  Filled 2022-09-26: qty 19.3

## 2022-09-26 MED ORDER — SODIUM CHLORIDE 0.9 % IV SOLN
10.0000 mg | Freq: Once | INTRAVENOUS | Status: AC
  Administered 2022-09-26: 10 mg via INTRAVENOUS
  Filled 2022-09-26: qty 10

## 2022-09-26 MED ORDER — SODIUM CHLORIDE 0.9 % IV SOLN: Freq: Once | INTRAVENOUS | Status: AC

## 2022-09-26 MED ORDER — ATROPINE SULFATE 1 MG/ML IV SOLN
0.5000 mg | Freq: Once | INTRAVENOUS | Status: AC | PRN
  Administered 2022-09-26: 0.5 mg via INTRAVENOUS
  Filled 2022-09-26: qty 1

## 2022-09-26 MED ORDER — PALONOSETRON HCL INJECTION 0.25 MG/5ML
0.2500 mg | Freq: Once | INTRAVENOUS | Status: AC
  Administered 2022-09-26: 0.25 mg via INTRAVENOUS
  Filled 2022-09-26: qty 5

## 2022-09-26 MED ORDER — SODIUM CHLORIDE 0.9 % IV SOLN
1600.0000 mg/m2 | INTRAVENOUS | Status: DC
  Administered 2022-09-26: 3100 mg via INTRAVENOUS
  Filled 2022-09-26: qty 62

## 2022-09-26 MED ORDER — SODIUM CHLORIDE 0.9 % IV SOLN
6.0000 mg/kg | Freq: Once | INTRAVENOUS | Status: AC
  Administered 2022-09-26: 500 mg via INTRAVENOUS
  Filled 2022-09-26: qty 20

## 2022-09-26 NOTE — Progress Notes (Signed)
[facility] OFFICE PROGRESS NOTE   Diagnosis: Colon cancer  INTERVAL HISTORY:   John Vance returns as scheduled.  He completed another cycle of FOLFIRI/panitumumab on 09/12/2022.  No nausea/vomiting or diarrhea.  He continues to have cold sensitivity in the extremities.  He has developed a rash over the trunk and face.  He uses hydrocortisone cream on the face.  He has a painful rash in the right groin for the past 5 days.  He has soreness of the gums.  Objective:  Vital signs in last 24 hours:  Blood pressure (!) 154/66, pulse 83, temperature 98.2 F (36.8 C), temperature source Oral, resp. rate 18, height 5\' 8"  (1.727 m), weight 166 lb 1.6 oz (75.3 kg), SpO2 100 %.    HEENT: No thrush.  Several ulcers at the palate, buccal mucosa, and left side of the tongue.  Periodontal disease.  The lips are dry. Resp: Lungs clear bilaterally Cardio: Regular rate and rhythm GI: No hepatosplenomegaly, nontender Vascular: No leg edema  Skin: Acne type rash over the anterior chest and face.  Moist desquamation with demarcated erythema in the right groin  Portacath/PICC-without erythema  Lab Results:  Lab Results  Component Value Date   WBC 11.9 (H) 09/26/2022   HGB 12.9 (L) 09/26/2022   HCT 39.0 09/26/2022   MCV 91.3 09/26/2022   PLT 217 09/26/2022   NEUTROABS 8.6 (H) 09/26/2022    CMP  Lab Results  Component Value Date   NA 138 09/12/2022   K 4.1 09/12/2022   CL 107 09/12/2022   CO2 23 09/12/2022   GLUCOSE 115 (H) 09/12/2022   BUN 15 09/12/2022   CREATININE 0.92 09/12/2022   CALCIUM 9.5 09/12/2022   PROT 6.6 09/12/2022   ALBUMIN 4.1 09/12/2022   AST 19 09/12/2022   ALT 13 09/12/2022   ALKPHOS 86 09/12/2022   BILITOT 0.4 09/12/2022   GFRNONAA >60 09/12/2022   GFRAA >60 12/01/2019    Lab Results  Component Value Date   CEA1 12.10 (H) 01/03/2021   CEA 16.92 (H) 09/12/2022      Medications: I have reviewed the patient's current  medications.   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), transverse colon cancer stage IIB (T4aN0) status post extended right hemicolectomy 05/07/2019, MSS Tumor invasive superficial portion of the muscle ("internal third "), no tumor perforation, no vascular invasion or perineural invasion, negative resection margins, 0/4 lymph nodes Elevated CEA 02/10/2019 CTs 03/05/2019, compared to outside CTs from 11/22/2018-worsened wall thickening in the cecum stable apple core lesion in the mid transverse colon, no evidence of recurrent tumor at the distal colonic anastomosis, no evidence of metastatic disease Colonoscopy 03/24/2019 20-2 malignant appearing masses noted in the colon, 1 filled the cecum measuring approximately 4 cm across and friable.  The other malignant appearing mass was circumferential creating a stricture with a 1.2 cm lumen located in the transverse segment approximately 4 to 5 cm in length.  There were 12-18 neoplastic appearing polyps in between the 2 obvious malignant masses ranging in size from 3 mm to 2 cm.  5 polyps distal to the transverse colon mass.  2 sigmoid colon polyps.  Cecum mass positive for adenocarcinoma.  Transverse colon mass positive for adenocarcinoma. Foundation 1 transverse colon-MSS, tumor mutation burden 7, KRAS wild-type, BRAF fusion 05/07/2019 laparoscopic extended right hemicolectomy, lysis of adhesions-5 cm invasive moderately differentiated adenocarcinoma involving cecum and proximal ascending  colon, carcinoma invades into the pericolonic soft tissue; 7 cm invasive moderately differentiated carcinoma involving the transverse colon, carcinoma invades into the serosal surface (visceral peritoneum); all resection margins negative for carcinoma.  Lymphovascular invasion present.  In the proximal colon metastatic carcinoma involves 3 of 17 lymph nodes  with 1 tumor deposit.  In the transverse colon 12 lymph nodes negative for metastatic carcinoma.  3 separate polyps: Tubular adenoma, inflammatory polyp and hyperplastic polyp Cycle 1 capecitabine 06/02/2019 Cycle 2 capecitabine 06/23/2019 Cycle 3 capecitabine 07/14/2019 Cycle 4 capecitabine 08/04/2019 Cycle 5 capecitabine 08/25/2019 Cycle 6 capecitabine 09/15/2019 Cycle 7 capecitabine 10/06/2019 Cycle 8 capecitabine 10/27/2019 Upper endoscopy 07/23/2020-negative Colonoscopy 07/23/2020-normal-appearing anastomoses, polyp removed from the descending colon-tubular adenoma CTs 01/18/2021-right abdominal mesenteric mass, mass in the low anatomic pelvis consistent with metastases Cycle 1 FOLFOX 02/21/2021 Cycle 2 FOLFOX 03/07/2021, dose reductions made due to mild neutropenia Cycle 3 FOLFOX 03/21/2021 Cycle 4 FOLFOX 04/04/2021 Cycle 5 FOLFOX 04/19/2021 Cycle 6 FOLFOX 05/02/2021 CTs 05/12/2021-decrease size of nodular right mesenteric mass, complete resolution of soft tissue enhancing nodule in the right inguinal canal, resolution of nodular soft tissue mass posterior to the bladder, enlargement of a left upper lobe nodule and new 4 mm left upper lobe nodule-indeterminate Cycle 7 FOLFOX 05/16/2021 Cycle 8 FOLFOX 05/30/2021, oxaliplatin and 5-FU bolus held due to neuropathy and thrombocytopenia Cycle 9 FOLFOX 06/13/2021, oxaliplatin dose reduced Cycle 10 FOLFOX 06/27/2021 CT 07/07/2021-decrease in right mesenteric mass, decreased size of lobulated left upper lobe nodule, resolution of previous "new "left upper lobe nodule, new area of wall thickening at the distal transverse colon Treatment changed to Xeloda 2 weeks on/1 week off beginning 07/18/2021 Cycle 2 Xeloda beginning 08/09/2021 Cycle 3 Xeloda beginning 08/30/2021 Cycle 4 Xeloda beginning 09/21/2021 Cycle 5 Xeloda beginning 10/12/2021 Cycle 6 Xeloda beginning 11/02/2021 CTs 11/14/2021-right lower quadrant spiculated small bowel mesenteric mass measured 2.8 x 3.7 cm,  previously 1.0 x 2.3 cm; adjacent mesenteric haziness and nodularity measuring up to 8 mm superiorly.  New 4 mm nodular lesion posterior segment right upper lobe along the major fissure. Treatment break beginning 11/17/2021 CTs 03/21/2022-multiple new liver metastases, enlarging implant in the right ileocolic mesentery, enlarging peritoneal implants in the low pelvis Cycle 1 FOLFIRI/Avastin 03/29/2022 Cycle 2 FOLFIRI/Avastin 04/11/2022 Cycle 3 FOLFIRI/Avastin 04/26/2022 Cycle 4 FOLFIRI/Avastin 05/10/2022 Cycle 5 FOLFIRI/Avastin 05/24/2022 CT abdomen/pelvis 06/02/2022-liver metastases are stable and smaller, smaller peritoneal/mesenteric nodules Cycle 6 FOLFIRI/Avastin 06/06/2022 Cycle 7 FOLFIRI/Avastin 06/20/2022 Cycle 8 FOLFIRI/Avastin 07/04/2022 Treatment held 07/18/2022 due to neutropenia Cycle 9 FOLFIRI/Avastin 07/24/2022, Udenyca Cycle 10 FOLFIRI/Avastin 08/08/2022, Udenyca CTs 08/18/2022-slight enlargement of several liver lesions, right mesenteric mass and low pelvic soft tissue nodule are stable Cycle 1 FOLFIRI/Panitumumab 08/29/2022 Cycle 2 FOLFIRI/Panitumumab 09/12/2022 Cycle 3 FOLFIRI/panitumumab 09/26/2022 2.   Microcytic anemia secondary to #1.    Resolved 3.   Family history of pancreas and colon cancer, negative genetic testing per the [facility] panel, ATM variant of unknown significance 4.   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  He has developed a panitumumab skin rash.  He has mucositis, likely secondary to 5-fluorouracil.  He appears to have a yeast rash in the right groin.  He will use Magic mouthwash and Orajel for the oral mucositis.  He will use Vaseline on the lips.  The 5-FU bolus and infusion will be  decreased with this cycle.  He will begin nystatin powder  for the right groin rash.  John. Posey Pronto will contact us for increased mouth soreness and persistence of the right groin rash.  He will return for an office visit in another cycle of FOLFIRI/panitumumab in 2 weeks.  Thornton Papas, MD  09/26/2022  8:31 AM

## 2022-09-26 NOTE — Patient Instructions (Addendum)
Discharge Instructions The chemotherapy medication bag should finish at 46 hours, 96 hours, or 7 days. For example, if your pump is scheduled for 46 hours and it was put on at 4:00 p.m., it should finish at 2:00 p.m. the day it is scheduled to come off regardless of your appointment time.     Estimated time to finish at 11:30 Thursday, September 28, 2022.   If the display on your pump reads "Low Volume" and it is beeping, take the batteries out of the pump and come to the cancer center for it to be taken off.   If the pump alarms go off prior to the pump reading "Low Volume" then call [phone] and someone can assist you.  If the plunger comes out and the chemotherapy medication is leaking out, please use your home chemo spill kit to clean up the spill. Do NOT use paper towels or other household products.  If you have problems or questions regarding your pump, please call either [phone] (24 hours a day) or the cancer center Monday-Friday 8:00 a.m.- 4:30 p.m. at the clinic number and we will assist you. If you are unable to get assistance, then go to the nearest Emergency Department and ask the staff to contact the IV team for assistance.   Karl Pock por elegir al [employer] de Cncer de [place] para brindarle atencin mdica de oncologa y Teacher, English as a foreign language.   Si usted tiene una cita de laboratorio con [employer] de [place], por favor vaya directamente al [employer] de Cncer y regstrese en el rea de Engineer, maintenance (IT).   Use ropa cmoda y [place] para tener fcil acceso a las vas del Portacath (acceso venoso de Set designer duracin) o la lnea PICC (catter central colocado por va perifrica).   Nos esforzamos por ofrecerle tiempo de calidad con su proveedor. Es posible que tenga que volver a programar su cita si llega tarde (15 minutos o ms).  El llegar tarde le afecta a usted y a otros pacientes cuyas citas son posteriores a Armed forces operational officer.  Adems, si usted falta a tres o ms citas sin avisar a la oficina, puede ser  retirado(a) de la clnica a discrecin del proveedor.      Para las solicitudes de renovacin de recetas, pida a su farmacia que se ponga en contacto con nuestra oficina y deje que transcurran 72 horas para que se complete el proceso de las renovaciones.    Hoy usted recibi los siguientes agentes de quimioterapia e/o inmunoterapia Vectibix, Irinotecan, Leucovorin, Fluorouracil.      Para ayudar a prevenir las nuseas y los vmitos despus de su tratamiento, le recomendamos que tome su medicamento para las nuseas segn las indicaciones.  LOS SNTOMAS QUE DEBEN COMUNICARSE INMEDIATAMENTE SE INDICAN A CONTINUACIN: *FIEBRE SUPERIOR A 100.4 F (38 C) O MS *ESCALOFROS O SUDORACIN *NUSEAS Y VMITOS QUE NO SE CONTROLAN CON EL MEDICAMENTO PARA LAS NUSEAS *DIFICULTAD INUSUAL PARA RESPIRAR  *MORETONES O HEMORRAGIAS NO HABITUALES *PROBLEMAS URINARIOS (dolor o ardor al Geographical information systems officer o frecuencia para Geographical information systems officer) *PROBLEMAS INTESTINALES (diarrea inusual, estreimiento, dolor cerca del ano) SENSIBILIDAD EN LA BOCA Y EN LA GARGANTA CON O SIN LA PRESENCIA DE LCERAS (dolor de garganta, llagas en la boca o dolor de muelas/dientes) ERUPCIN, HINCHAZN O DOLORES INUSUALES FLUJO VAGINAL INUSUAL O PICAZN/RASQUIA    Los puntos marcados con un asterisco ( *) indican una posible emergencia y debe hacer un seguimiento tan pronto como le sea posible o vaya al Departamento de Emergencias si se le presenta algn problema.  Por favor, muestre la [place] DE ADVERTENCIA DE Marc Morgans DE ADVERTENCIA DE [place] Fanti al registrarse en [address] de Emergencias y a la enfermera de triaje.  Si tiene preguntas despus de su visita o necesita cancelar o volver a programar su cita, por favor pngase en contacto con [facility]  Dept: [phone]  y siga las instrucciones. Las horas de oficina son de 8:00 a.m. a 4:30 p.m. de lunes a viernes. Por favor, tenga en cuenta que los  mensajes de voz que se dejan despus de las 4:00 p.m. posiblemente no se devolvern hasta el siguiente da de [place].  Cerramos los fines de semana y [employer]. En todo momento tiene acceso a una enfermera para preguntas urgentes. Por favor, llame al nmero principal de la clnica Dept: [phone] y siga las instrucciones.   Para cualquier pregunta que no sea de carcter urgente, tambin puede ponerse en contacto con su proveedor [employer]. Ahora ofrecemos visitas electrnicas para cualquier persona mayor de 18 aos que solicite atencin mdica en lnea para los sntomas que no sean urgentes. Para ms detalles vaya a [email or web address].   Tambin puede bajar la aplicacin de MyChart! Vaya a la tienda de aplicaciones, busque "MyChart", abra la aplicacin, seleccione [place], e ingrese con su nombre de usuario y la contrasea de Clinical cytogeneticist.  Panitumumab Injection Qu es este medicamento? El PANITUMUMAB trata el cncer colorrectal. Acta bloqueando una protena que hace que las clulas cancerosas crezcan y se multipliquen. Esto ayuda a Neurosurgeon propagacin de las clulas cancerosas. Es un anticuerpo monoclonal. Cleda Clarks medicamento puede ser utilizado para otros usos; si tiene alguna pregunta consulte con su proveedor de atencin mdica o con su farmacutico. MARCAS COMUNES: Vectibix Qu le debo informar a mi profesional de la salud antes de tomar este medicamento? Necesitan saber si usted presenta alguno de los siguientes problemas o situaciones: Enfermedad ocular Niveles bajos de magnesio en la sangre Enfermedad pulmonar Una reaccin alrgica o inusual al panitumumab, a otros medicamentos, alimentos, colorantes o conservantes Si est embarazada o buscando quedar embarazada Si est amamantando a un beb Cmo debo [employer]? Este medicamento se inyecta en una vena. Su equipo de atencin lo [employer] en un hospital o en un entorno  clnico. Hable con su equipo de atencin sobre el uso de este medicamento en nios. Puede requerir atencin especial. Sobredosis: Pngase en contacto inmediatamente con un centro toxicolgico o una sala de urgencia si usted cree que haya tomado demasiado medicamento. ATENCIN: [employer] es solo para usted. No comparta este medicamento con nadie. Qu sucede si me olvido de una dosis? Cumpla con las citas para dosis de seguimiento. Es importante no olvidar ninguna dosis. Llame a su equipo de atencin si no puede asistir a una cita. Qu puede interactuar con este medicamento? Bevacizumab Puede ser que esta lista no menciona todas las posibles interacciones. Informe a su profesional de [employer] de [employer] Inc productos a base de hierbas, medicamentos de [place] o suplementos nutritivos que est tomando. Si usted fuma, consume bebidas alcohlicas o si utiliza drogas ilegales, indqueselo tambin a su profesional de [employer]. Algunas sustancias pueden interactuar con su medicamento. A qu debo estar atento al usar [employer]? Se supervisar su estado de salud atentamente mientras reciba este medicamento. Este medicamento podra hacerle sentir un Risk analyst. Esto no es inusual, ya que la quimioterapia puede afectar tanto a las clulas sanas como a las clulas cancerosas.  Si presenta algn efecto secundario, infrmelo. Contine con el tratamiento incluso si se siente enfermo, a menos que su equipo de [employer] lo suspenda. Este medicamento puede aumentar su sensibilidad al sol. Evite exponerse a la Environmental manager recibe [employer] Corporation y [place] 2 meses despus de Customer service manager. Si no la Network engineer, utilice ropa protectora y crema de Orthoptist. No utilice lmparas solares, camas solares ni cabinas solares. Consulte con su equipo de atencin si tiene diarrea grave, nuseas y vmitos, o sudoracin intensa. La prdida de demasiado lquido corporal podra  hacer que sea peligroso usar [employer]. Este medicamento podra causar reacciones graves en la piel. Pueden presentarse semanas a meses despus de comenzar a Astronomer. Contacte a su equipo de atencin de inmediato si nota que tiene fiebre o sntomas gripales con una erupcin. La erupcin puede ser roja o Clarisa Fling, y luego puede convertirse en ampollas o descamacin de la piel. Tambin podra observar una erupcin roja con hinchazn en la cara, los labios o los ganglios linfticos en el cuello o debajo de los brazos. Hable con su equipo de atencin si podra estar embarazada. Este medicamento puede causar defectos congnitos graves si se [place] durante el embarazo y por 2 meses despus de la ltima dosis. Se recomienda utilizar un mtodo anticonceptivo mientras est usando este medicamento y por 2 meses despus de la ltima dosis. Su equipo de atencin mdica puede ayudarle a Clinical research associate la opcin que mejor se adapte a sus necesidades. No debe amamantar a un beb mientras [place] este medicamento y por 2 meses despus de la ltima dosis. Este medicamento podra causar infertilidad. Hable con su equipo de atencin si le preocupa su fertilidad. Qu efectos secundarios puedo tener al [employer] este medicamento? Efectos secundarios que debe informar a su equipo de atencin tan pronto como sea posible: Reacciones alrgicas: erupcin cutnea, comezn/picazn, urticaria, hinchazn de la cara, los labios, la lengua o la garganta Tos seca, falta de aire o problemas para Dietitian, enrojecimiento, irritacin o secrecin de los ojos con visin borrosa o disminuida Reacciones a la infusin: Journalist, newspaper, falta de aire o dificultad para respirar, sensacin de desmayo o aturdimiento Nivel bajo de magnesio: dolor o calambres musculares, debilidad o fatiga inusuales, frecuencia cardiaca rpida o irregular, temblores Nivel bajo de potasio: dolor o calambres musculares, debilidad o fatiga inusuales,  frecuencia cardiaca rpida o irregular, estreimiento Enrojecimiento, formacin de ampollas, descamacin o distensin de la piel, incluso dentro de la boca Reacciones cutneas en las zonas expuestas al sol Efectos secundarios que generalmente no requieren atencin mdica (debe informarlos a su equipo de atencin si persisten o si son molestos): Cambio en la forma, grosor o color de las uas Diarrea Piel seca Fatiga Nuseas Vmito Puede ser que esta lista no menciona todos los posibles efectos secundarios. Comunquese a su mdico por asesoramiento mdico [employer]ard. Usted puede informar los efectos secundarios a la FDA por telfono al 1-800-FDA-1088. Dnde debo guardar mi medicina? Este medicamento se administra en hospitales o clnicas. No se guarda en su casa. ATENCIN: Este folleto es un resumen. Puede ser que no cubra toda la posible informacin. Si usted tiene preguntas acerca de esta medicina, consulte con su mdico, su farmacutico o su profesional de Radiographer, therapeutic.  2024 Elsevier/Gold Standard (2022-03-01 00:00:00)  Irinotecan Injection Qu es este medicamento? El IRINOTECN trata algunos tipos de cncer. Acta desacelerando el crecimiento de clulas cancerosas. Este medicamento puede ser utilizado para otros usos;  si tiene alguna pregunta consulte con su proveedor de atencin mdica o con su farmacutico. MARCAS COMUNES: Camptosar Qu le debo informar a mi profesional de la salud antes de tomar este medicamento? Necesitan saber si usted presenta alguno de los [facility] o situaciones: Deshidratacin Diarrea Infeccin, especialmente una infeccin viral, tales como varicela, fuegos labiales, herpes Enfermedad heptica Niveles bajos de clulas sanguneas (glbulos blancos, glbulos rojos y plaquetas) Niveles bajos de Customer service manager, tales como calcio, magnesio o potasio en la sangre Radiacin en curso o reciente Una reaccin alrgica o inusual al  irinotecn, a otros medicamentos, alimentos, colorantes o conservantes Si usted o su pareja est embarazada o intentando quedar embarazada Si est amamantando a un beb Cmo debo Visual merchandiser medicamento? Este medicamento se inyecta en una vena. Su equipo de atencin lo [employer] en un hospital o en un entorno clnico. Hable con su equipo de atencin sobre el uso de este medicamento en nios. Puede requerir atencin especial. Sobredosis: Pngase en contacto inmediatamente con un centro toxicolgico o una sala de urgencia si usted cree que haya tomado demasiado medicamento. ATENCIN: [employer] American es solo para usted. No comparta este medicamento con nadie. Qu sucede si me olvido de una dosis? Cumpla con las citas para dosis de seguimiento. Es importante no olvidar ninguna dosis. Llame a su equipo de atencin si no puede asistir a una cita. Qu puede interactuar con este medicamento? No use este medicamento con ninguno de los siguientes productos: Cobicistat Itraconazol Este medicamento tambin podra interactuar con los siguientes productos: Ciertos antibiticos, tales como claritromicina, rifampicina, rifabutina Ciertos medicamentos antivirales para el VIH o SIDA Ciertos medicamentos para las infecciones micticas, tales como ketoconazol, posaconazol, voriconazol Ciertos medicamentos para convulsiones, tales como [place], fenobarbital, fenitona Gemfibrozil Nefazodona Hierba de [address] ser que esta lista no menciona todas las posibles interacciones. Informe a su profesional de [employer] de In[employer] Inc productos a base de hierbas, medicamentos de [place] o suplementos nutritivos que est tomando. Si usted fuma, consume bebidas alcohlicas o si utiliza drogas ilegales, indqueselo tambin a su profesional de [employer]. Algunas sustancias pueden interactuar con su medicamento. A qu debo estar atento al usar [employer]? Se supervisar su estado de salud atentamente  mientras reciba este medicamento. Usted podra necesitar realizarse [employer] de sangre mientras est usando [place]. Este medicamento podra hacerle sentir un Risk analyst. Esto no es inusual, ya que la quimioterapia puede afectar tanto a las clulas sanas como a las clulas cancerosas. Si presenta algn efecto secundario, infrmelo. Contine con el tratamiento incluso si se siente enfermo, a menos que su equipo de [employer] lo suspenda. Este medicamento puede causar efectos secundarios graves. Para reducir Nurse, adult, su equipo de atencin puede darle otros medicamentos que deber usar antes de Regulatory affairs officer. Asegrese de seguir las instrucciones de su equipo de atencin. Este medicamento podra afectar su coordinacin, tiempo de reaccin o juicio. No conduzca ni opere maquinaria pesada hasta que sepa cmo le afecta este medicamento. Pngase de pie o levntese lentamente para reducir el riesgo de mareos o [place]. Beber alcohol con [employer] Corporation puede aumentar el riesgo de estos efectos secundarios. Este medicamento puede aumentar su riesgo de contraer una infeccin. Llame para pedir consejo a su equipo de atencin si tiene fiebre, escalofros, dolor de garganta o cualquier otro sntoma de resfriado o gripe. No se trate usted mismo. Trate de no acercarse a personas que estn enfermas. Evite usar [employer] contengan aspirina, acetaminofeno, ibuprofeno, naproxeno o ketoprofeno,  a menos que as lo indique su equipo de atencin. Estos medicamentos pueden ocultar la fiebre. Este medicamento podra aumentar el riesgo de moretones o sangrado. Llame a su equipo de atencin si observa sangrados inusuales. Proceda con cuidado al cepillar sus dientes, usar hilo dental o Chemical engineer palillos para los dientes, ya que podra contraer una infeccin o Geophysicist/field seismologist con mayor facilidad. Si recibe algn tratamiento dental, informe a su dentista que est [employer]. Informe a su equipo  de atencin si usted o su pareja est embarazada o si cree que alguna de las [place] podra [address]. Este medicamento puede causar defectos congnitos graves si se [place] durante el embarazo y por 6 meses despus de la ltima dosis. Deber realizarse una prueba de embarazo y obtener resultado negativo antes de Games developer a Producer, television/film/video. Se recomienda utilizar un mtodo anticonceptivo mientras est usando este medicamento y por 6 meses despus de la ltima dosis. Su equipo de atencin mdica puede ayudarle a Clinical research associate la opcin que mejor se adapte a sus necesidades. No embarace a Careers information officer est usando este medicamento y durante 3 meses despus de la ltima dosis. Use un condn como anticonceptivo [employer]. No debe amamantar a un beb mientras [place] este medicamento y por 7 das despus de la ltima dosis. Este medicamento podra causar infertilidad. Hable con su equipo de atencin si le preocupa su fertilidad. Qu efectos secundarios puedo tener al [employer] este medicamento? Efectos secundarios que debe informar a su equipo de atencin tan pronto como sea posible: Reacciones alrgicas: erupcin cutnea, comezn/picazn, urticaria, hinchazn de la cara, los labios, la lengua o la garganta Tos seca, falta de aire o problemas para respirar Aumento de saliva o lgrimas, aumento de la sudoracin, calambres estomacales, diarrea, pupilas pequeas, debilidad o fatiga inusuales, frecuencia cardiaca lenta Infeccin: fiebre, escalofros, tos, dolor de garganta, heridas que no sanan, dolor o problemas para Geographical information systems officer, sensacin general de molestia o malestar [employer] riones: disminucin en la cantidad de orina, hinchazn de los tobillos, las manos o los pies Recuento bajo de glbulos rojos: debilidad o fatiga inusuales, Psychiatrist, Engineer, mining de cabeza, dificultad para respirar Diarrea grave o prolongada Sangrado o moretones inusuales Efectos secundarios que generalmente no requieren atencin  mdica (debe informarlos a su equipo de atencin si persisten o si son molestos): Estreimiento Diarrea Cada del cabello Prdida del apetito Nuseas Dolor estomacal Puede ser que esta lista no menciona todos los posibles efectos secundarios. Comunquese a su mdico por asesoramiento mdico [employer]lett[employer]ard. Usted puede informar los efectos secundarios a la FDA por telfono al 1-800-FDA-1088. Dnde debo guardar mi medicina? Este medicamento se administra en hospitales o clnicas. No se guarda en su casa. ATENCIN: Este folleto es un resumen. Puede ser que no cubra toda la posible informacin. Si usted tiene preguntas acerca de esta medicina, consulte con su mdico, su farmacutico o su profesional de Radiographer, therapeutic.  2024 Elsevier/Gold Standard (2022-03-01 00:00:00)  Leucovorin Injection Qu es este medicamento? La LEUC[place]ORINA previene los efectos secundarios de ciertos medicamentos, Consulting civil engineer. Acta [employer] niveles de folato. Esto ayuda a proteger las clulas sanas del cuerpo. Tambin podra usarse para tratar la anemia causada por niveles bajos de folato. Tambin se puede Chemical engineer con fluorouracilo, un tipo de quimioterapia, para Tax adviser. Acta [employer] del fluorouracilo en el cuerpo. Este medicamento puede ser utilizado para otros usos; si tiene alguna pregunta consulte con su proveedor de atencin mdica o con su farmacutico.  Qu le debo informar a mi profesional de la salud antes de tomar este medicamento? Necesitan saber si usted presenta alguno de los [facility] [facility] o situaciones: Anemia por niveles bajos de vitamina B12 en la sangre Una reaccin alrgica o inusual a la leucovorina, al cido flico, a otros medicamentos, alimentos, colorantes o conservantes Si est embarazada o buscando quedar embarazada Si est amamantando a un beb Cmo debo [employer]? Este medicamento se inyecta en una vena o un  msculo. Su equipo de atencin lo [employer] en un hospital o en un entorno clnico. Hable con su equipo de atencin sobre el uso de este medicamento en nios. Puede requerir atencin especial. Sobredosis: Pngase en contacto inmediatamente con un centro toxicolgico o una sala de urgencia si usted cree que haya tomado demasiado medicamento. ATENCIN: [employer] es solo para usted. No comparta este medicamento con nadie. Qu sucede si me olvido de una dosis? Cumpla con las citas para dosis de seguimiento. Es importante no olvidar ninguna dosis. Llame a su equipo de atencin si no puede asistir a una cita. Qu puede interactuar con este medicamento? Capecitabina Fluorouracilo Fenobarbital Fenitona Primidona Trimetoprima;sulfametoxasol Puede ser que esta lista no menciona todas las posibles interacciones. Informe a su profesional de [employer] de [employer] Inc productos a base de hierbas, medicamentos de [place] o suplementos nutritivos que est tomando. Si usted fuma, consume bebidas alcohlicas o si utiliza drogas ilegales, indqueselo tambin a su profesional de [employer]. Algunas sustancias pueden interactuar con su medicamento. A qu debo estar atento al usar [employer]? Se supervisar su estado de salud atentamente mientras reciba este medicamento. Este medicamento podra aumentar los efectos secundarios del 5-fluorouracilo. Informe a su equipo de atencin si tiene diarrea o llagas en la boca que no mejoran o empeoran. Qu efectos secundarios puedo tener al [employer] este medicamento? Efectos secundarios que debe informar a su equipo de atencin tan pronto como sea posible: Reacciones alrgicas: erupcin cutnea, comezn/picazn, urticaria, hinchazn de la cara, los labios, la lengua o la garganta Puede ser que esta lista no menciona todos los posibles efectos secundarios. Comunquese a su mdico por asesoramiento mdico [employer]lett[employer]ard. Usted puede informar los  efectos secundarios a la FDA por telfono al 1-800-FDA-1088. Dnde debo guardar mi medicina? Este medicamento se administra en hospitales o clnicas. No se guarda en su casa. ATENCIN: Este folleto es un resumen. Puede ser que no cubra toda la posible informacin. Si usted tiene preguntas acerca de esta medicina, consulte con su mdico, su farmacutico o su profesional de Radiographer, therapeutic.  2024 Elsevier/Gold Standard (2022-03-01 00:00:00)  Fluorouracil Injection Qu es este medicamento? El FLUOROURACILO trata algunos tipos de cncer. Acta desacelerando el crecimiento de clulas cancerosas. Este medicamento puede ser utilizado para otros usos; si tiene alguna pregunta consulte con su proveedor de atencin mdica o con su farmacutico. MARCAS COMUNES: Adrucil Qu le debo informar a mi profesional de la salud antes de tomar este medicamento? Necesitan saber si usted presenta alguno de los siguientes problemas o situaciones: Trastornos sanguneos Deficiencia de dihidropirimidina deshidrogenasa (DPD) Infecciones, tales como varicela, fuegos labiales, herpes Enfermedad renal Enfermedad heptica Desnutricin [place] de radiacin en curso o reciente [place] reaccin alrgica o inusual al fluorouracilo, a otros medicamentos, alimentos, colorantes o conservantes Si usted o su pareja est embarazada o intentando quedar embarazada Si est amamantando a un beb Cmo debo Visual merchandiser medicamento? Este medicamento se inyecta en una vena. Su equipo de atencin lo [place] en un hospital o en  un entorno clnico. Hable con su equipo de atencin sobre el uso de este medicamento en nios. Puede requerir atencin especial. Sobredosis: Pngase en contacto inmediatamente con un centro toxicolgico o una sala de urgencia si usted cree que haya tomado demasiado medicamento. ATENCIN: [employer] es solo para usted. No comparta este medicamento con nadie. Qu sucede si me olvido de una dosis? Cumpla con las  citas para dosis de seguimiento. Es importante no olvidar ninguna dosis. Llame a su equipo de atencin si no puede asistir a una cita. Qu puede interactuar con este medicamento? No use este medicamento con ninguno de los siguientes productos: Administrator, arts de virus vivos Este medicamento tambin podra Product/process development scientist con los siguientes productos: Medicamentos que tratan o previenen cogulos sanguneos, tales como warfarina, enoxaparina, dalteparina Puede ser que esta lista no menciona todas las posibles interacciones. Informe a su profesional de [employer] de [employer] Inc productos a base de hierbas, medicamentos de [place] o suplementos nutritivos que est tomando. Si usted fuma, consume bebidas alcohlicas o si utiliza drogas ilegales, indqueselo tambin a su profesional de [employer]. Algunas sustancias pueden interactuar con su medicamento. A qu debo estar atento al usar [employer]? Se supervisar su estado de salud atentamente mientras reciba este medicamento. Este medicamento podra hacerle sentir un Risk analyst. Esto no es inusual, ya que la quimioterapia puede afectar tanto a las clulas sanas como a las clulas cancerosas. Si presenta algn efecto secundario, infrmelo. Contine con el tratamiento incluso si se siente enfermo, a menos que su equipo de [employer] lo suspenda. En algunos casos, podra recibir [employer] para ayudarle con los efectos secundarios. Siga todas las instrucciones para usarlos. Este medicamento puede aumentar su riesgo de contraer una infeccin. Llame para pedir consejo a su equipo de atencin si tiene fiebre, escalofros, dolor de garganta o cualquier otro sntoma de resfriado o gripe. No se trate usted mismo. Trate de no acercarse a personas que estn enfermas. Este medicamento podra aumentar el riesgo de moretones o sangrado. Llame a su equipo de atencin si observa sangrados inusuales. Proceda con cuidado al cepillar sus dientes, usar  hilo dental o Chemical engineer palillos para los dientes, ya que podra contraer una infeccin o Geophysicist/field seismologist con mayor facilidad. Si recibe algn tratamiento dental, informe a su dentista que est [employer]. Evite usar medicamentos que contengan aspirina, acetaminofeno, ibuprofeno, naproxeno o ketoprofeno, a menos que as lo indique su equipo de atencin. Estos medicamentos pueden ocultar la fiebre. No trate la diarrea con productos de [employer]. Contacte a su equipo de atencin si tiene diarrea por ms de [address], o si es grave y [place]. Este medicamento puede aumentar su sensibilidad al sol. Evite la [employer]. Si no la Network engineer, utilice ropa protectora y crema de Orthoptist. No utilice lmparas solares, camas solares ni cabinas solares. Informe a su equipo de atencin si usted o su pareja est buscando un embarazo o si cree que podra estar embarazada. Este medicamento puede causar defectos congnitos graves si se [place] durante el embarazo y por 3 meses despus de la ltima dosis. Se recomienda Chemical engineer un mtodo anticonceptivo confiable mientras est usando este medicamento y durante 3 meses despus de la ltima dosis. Hable con su equipo de atencin sobre mtodos anticonceptivos eficaces. No embarace a Careers information officer est usando este medicamento y durante 3 meses despus de la ltima dosis. Use un condn durante las relaciones sexuales que mantenga en [place]. No debe amamantar a un  beb mientras est [employer]. Este medicamento podra causar infertilidad. Hable con su equipo de atencin si le preocupa su fertilidad. Qu efectos secundarios puedo tener al [employer] este medicamento? Efectos secundarios que debe informar a su equipo de atencin tan pronto como sea posible: Reacciones alrgicas: erupcin cutnea, comezn/picazn, urticaria, hinchazn de la cara, los labios, la lengua o la garganta Ataque cardiaco: Engineer, mining u opresin en el pecho, los hombros, los brazos o  la [place], nuseas, falta de [place], piel fra o sudorosa, sensacin de [place] o aturdimiento Insuficiencia cardiaca: falta de aire, hinchazn de los tobillos, los pies o las manos, aumento de peso repentino, debilidad o fatiga inusuales Cambios en el ritmo cardiaco: frecuencia cardiaca rpida o irregular, mareos, sensacin de desmayo o aturdimiento, dolor en el pecho, dificultad para respirar Niveles elevados de amonaco: debilidad o fatiga inusuales, confusin, prdida de apetito, nuseas, vmitos, convulsiones Infeccin: fiebre, escalofros, tos, dolor de garganta, heridas que no sanan, dolor o problemas para Geographical information systems officer, sensacin general de molestia o malestar Recuento bajo de glbulos rojos: debilidad o fatiga inusuales, mareo, dolor de cabeza, dificultad para Dietitian, hormigueo o entumecimiento en las manos o los pies, debilidad muscular, cambios en la visin, confusin o dificultad para hablar, prdida del equilibrio o la coordinacin, dificultad para caminar, convulsiones Enrojecimiento, hinchazn y [employer] en la piel de las manos y los pies Diarrea grave o prolongada Sangrado o moretones inusuales Efectos secundarios que generalmente no requieren atencin mdica (debe informarlos a su equipo de atencin si persisten o si son molestos): Piel seca Dolor de cabeza Aumento de Armed forces technical officer, enrojecimiento o hinchazn con llagas dentro de la boca o la garganta Sensibilidad a la luz [place] Puede ser que esta lista no menciona todos los posibles efectos secundarios. Comunquese a su mdico por asesoramiento mdico [employer]. Usted puede informar los efectos secundarios a la FDA por telfono al 1-800-FDA-1088. Dnde debo guardar mi medicina? Este medicamento se administra en hospitales o clnicas. No se guarda en su casa. ATENCIN: Este folleto es un resumen. Puede ser que no cubra toda la posible informacin. Si usted tiene preguntas acerca de esta medicina,  consulte con su mdico, su farmacutico o su profesional de Radiographer, therapeutic.  2024 Elsevier/Gold Standard (2022-03-01 00:00:00)

## 2022-09-26 NOTE — Progress Notes (Signed)
Visit today assisted by spanish interpreter, John Vance w/[facility]

## 2022-09-26 NOTE — Progress Notes (Signed)
Patient seen by Dr. Truett Perna today  Vitals are within treatment parameters.  Labs reviewed by Dr. Truett Perna and are within treatment parameters.No intervention for Mg+ 1.6. Per verbal report from lab: all CMP in normal range except glucose 110 and low protein. OK to proceed per Dr. Truett Perna  Per physician team, patient is ready for treatment. Please note that modifications are being made to the treatment plan including Dose reduction of   fluorouracil due to mucositis.

## 2022-09-27 ENCOUNTER — Other Ambulatory Visit: Payer: Self-pay

## 2022-09-28 ENCOUNTER — Inpatient Hospital Stay: Payer: Commercial Managed Care - HMO

## 2022-09-28 VITALS — BP 124/64 | HR 86 | Temp 98.6°F | Resp 18

## 2022-09-28 DIAGNOSIS — C189 Malignant neoplasm of colon, unspecified: Secondary | ICD-10-CM

## 2022-09-28 DIAGNOSIS — Z5111 Encounter for antineoplastic chemotherapy: Secondary | ICD-10-CM | POA: Diagnosis not present

## 2022-09-28 MED ORDER — SODIUM CHLORIDE 0.9% FLUSH
10.0000 mL | INTRAVENOUS | Status: DC | PRN
  Administered 2022-09-28: 10 mL

## 2022-09-28 MED ORDER — HEPARIN SOD (PORK) LOCK FLUSH 100 UNIT/ML IV SOLN
500.0000 [IU] | Freq: Once | INTRAVENOUS | Status: AC | PRN
  Administered 2022-09-28: 500 [IU]

## 2022-09-28 NOTE — Patient Instructions (Signed)

## 2022-09-28 NOTE — Progress Notes (Signed)
Domingo Cocking, Spanish Interpreter present during pump stop appointment.  Son present for pump stop appointment and was educated on how to administer udenyca injection.  Son's and patient's questions were all answered during the visit.

## 2022-10-02 ENCOUNTER — Other Ambulatory Visit: Payer: Self-pay | Admitting: *Deleted

## 2022-10-02 ENCOUNTER — Telehealth: Payer: Self-pay | Admitting: *Deleted

## 2022-10-02 MED ORDER — FLUCONAZOLE 100 MG PO TABS: 100.0000 mg | ORAL_TABLET | Freq: Every day | ORAL | 0 refills | Status: DC

## 2022-10-02 NOTE — Telephone Encounter (Addendum)
Left VM that Fluconazole script 100 mg daily x 5 days was sent to his pharmacy per Dr. Truett Perna. May stop the nystatin powder.

## 2022-10-02 NOTE — Telephone Encounter (Signed)
Reports using Nystatin powder since 09/26/22 to groin. His groin rash on right is getting worse. Asking if anything else can be given?

## 2022-10-05 ENCOUNTER — Encounter (HOSPITAL_BASED_OUTPATIENT_CLINIC_OR_DEPARTMENT_OTHER): Payer: Self-pay

## 2022-10-05 ENCOUNTER — Other Ambulatory Visit: Payer: Self-pay

## 2022-10-05 ENCOUNTER — Encounter: Payer: Self-pay | Admitting: *Deleted

## 2022-10-05 ENCOUNTER — Emergency Department (HOSPITAL_BASED_OUTPATIENT_CLINIC_OR_DEPARTMENT_OTHER)
Admission: EM | Admit: 2022-10-05 | Discharge: 2022-10-05 | Disposition: A | Discharge status: Home / Self Care | Payer: Commercial Managed Care - HMO | Attending: Emergency Medicine | Admitting: Emergency Medicine

## 2022-10-05 DIAGNOSIS — L304 Erythema intertrigo: Secondary | ICD-10-CM | POA: Diagnosis not present

## 2022-10-05 DIAGNOSIS — C189 Malignant neoplasm of colon, unspecified: Secondary | ICD-10-CM | POA: Insufficient documentation

## 2022-10-05 DIAGNOSIS — R21 Rash and other nonspecific skin eruption: Secondary | ICD-10-CM | POA: Diagnosis present

## 2022-10-05 MED ORDER — GOLD BOND EX POWD: Freq: Two times a day (BID) | CUTANEOUS | 0 refills | Status: DC

## 2022-10-05 MED ORDER — CLOTRIMAZOLE-BETAMETHASONE 1-0.05 % EX CREA: TOPICAL_CREAM | CUTANEOUS | 0 refills | Status: DC

## 2022-10-05 NOTE — ED Triage Notes (Signed)
Pt c/o itchiness, pain in groin. Seen for same, rash was described as "acne-type rash all over anterior chest/ face. Moist desquamation w demarcated erythema on R groin." Pt was prescribed "some powder" x1wk- "doesn't work, it's getting worse." On Monday, RX changed to fluconazole, 3/5 pills taken. Pt ambulatory to triage, denies SHOB, fever, additional symptoms.

## 2022-10-05 NOTE — ED Provider Notes (Signed)
[place] EMERGENCY DEPARTMENT AT [facility] Provider Note   CSN: [id number] Arrival date & time: 10/05/22  0945     History  Chief Complaint  Patient presents with  . Rash    [address] Nonie Hoyer is a 82 y.o. male.  Patient with history of colon cancer currently on chemotherapy presents today with complaints of groin rash. He states that same began at the beginning of June and has been persistently worsening since then. He called his oncologist for same and was initially prescribed nystatin cream which he did for several days with no improvement.  He then called his oncologist to report that his symptoms were continuing to worsen and was switch to oral fluconazole which he has taken 3/5 pills of with no improvement. He denies fevers or chills. The area is not draining. Patient is on chronic doxycycline for antibiotic prophylaxis given he is actively on chemotherapy.  The history is provided by the patient. No language interpreter was used.  Rash      Home Medications Prior to Admission medications   Medication Sig Start Date End Date Taking? Authorizing Provider  clotrimazole-betamethasone (LOTRISONE) cream Apply to affected area 2 times daily prn 10/05/22  Yes Mry Lamia, Shawn Route, PA-C  menthol-zinc oxide (GOLD BOND) powder Apply topically 2 (two) times daily. 10/05/22  Yes Delylah Stanczyk, Shawn Route, PA-C  Ascorbic Acid (VITAMIN C) 1000 MG tablet Take 1,000 mg by mouth daily.    [provider]  b complex vitamins capsule Take 1 capsule by mouth daily.    [provider]  bismuth subsalicylate (PEPTO BISMOL) 262 MG chewable tablet Chew 262 mg by mouth daily as needed for indigestion or diarrhea or loose stools. Patient not taking: Reported on 09/26/2022    [provider]  doxycycline (VIBRA-TABS) 100 MG tablet Take 1 tablet (100 mg total) by mouth 2 (two) times daily. 08/22/22   Ladene Artist, MD  esomeprazole (NEXIUM) 20 MG capsule Take 20 mg by mouth  daily at 12 noon.    [provider]  fluconazole (DIFLUCAN) 100 MG tablet Take 1 tablet (100 mg total) by mouth daily. 10/02/22   Ladene Artist, MD  lidocaine-prilocaine (EMLA) cream Apply 1 Application topically as needed. 04/11/22   Rana Snare, NP  magic mouthwash SOLN Take 5 mLs by mouth 4 (four) times daily as needed. 07/24/22   [provider]  pegfilgrastim-cbqv [facility]) 6 MG/0.6ML injection Administer 6 mg SQ on day 3 of each 14 day chemotherapy cycle. 1st dose needed on 09/28/22 Patient not taking: Reported on 09/26/2022 09/19/22   Ladene Artist, MD  PREVIDENT 5000 BOOSTER PLUS 1.1 % PSTE Take by mouth daily. 04/08/21   [provider]  prochlorperazine (COMPAZINE) 10 MG tablet Take 1 tablet (10 mg total) by mouth every 6 (six) hours as needed for nausea or vomiting. Patient not taking: Reported on 06/06/2022 04/11/22   Rana Snare, NP      Allergies    Patient has no known allergies.    Review of Systems   Review of Systems  Skin:  Positive for rash.  All other systems reviewed and are negative.   Physical Exam Updated Vital Signs BP (!) 159/72   Pulse 98   Temp 98.6 F (37 C)   Resp 16   SpO2 99%  Physical Exam Vitals and nursing note reviewed. Exam conducted with a chaperone present.  Constitutional:      General: He is not in acute distress.  Appearance: Normal appearance. He is normal weight. He is not ill-appearing, toxic-appearing or diaphoretic.  HENT:     Head: Normocephalic and atraumatic.  Cardiovascular:     Rate and Rhythm: Normal rate.  Pulmonary:     Effort: Pulmonary effort is normal. No respiratory distress.  Genitourinary:    Comments: Erythematous moist homogenous patch noted to the bilateral inguinal folds. No fluctuance or induration or purulent drainage. No scrotal involvement. No crepitus. Musculoskeletal:        General: Normal range of motion.     Cervical back: Normal range of motion.  Skin:    General:  Skin is warm and dry.  Neurological:     General: No focal deficit present.     Mental Status: He is alert.  Psychiatric:        Mood and Affect: Mood normal.        Behavior: Behavior normal.     ED Results / Procedures / Treatments   Labs (all labs ordered are listed, but only abnormal results are displayed) Labs Reviewed - No data to display  EKG None  Radiology No results found.  Procedures Procedures    Medications Ordered in ED Medications - No data to display  ED Course/ Medical Decision Making/ A&P Clinical Course as of 10/05/22 1055  Thu Oct 05, 2022  1049 This is an 82 year old gentleman with a history of colon cancer on chemotherapy presenting to the ED with concern for desquamating rash of the bilateral inguinal folds.  This began approximately 1 week ago.  His son at the bedside reports he initially attempted nystatin powder for several days, with no improvement, then restarted on fluconazole orally which he has been taking for 3 days.  The rash remains angry and red and painful.  On exam the patient has a beefy red rash involving the bilateral inguinal folds, sparing the scrotum itself as well as the perineum.  There is no underlying crepitus.  This is consistent with intertrigo.  Given the amount of erythema and redness I do think a antifungal and steroid combination cream may help with the inflammation and treatment, and would consider also zinc powder to help keep this area dry.  The patient is on chronic doxycycline, which is reasonable for bacterial prophylaxis, and I do not think he needs additional antibiotics at this time.  I did discuss return precautions with the patient and his son verbalized understanding [MT]    Clinical Course User Index [MT] Trifan, Kermit Balo, MD                             Medical Decision Making Risk OTC drugs. Prescription drug management.   Patient presents today with complaints of rash in the bilateral inguinal fold for  the past few weeks.  He is afebrile, nontoxic-appearing, and in no acute distress with reassuring vital signs.  Physical exam reveals findings consistent with intertrigo. No signs/symptoms of superimposed infection or abscess. Rash spares the scrotum and perineum. No blisters, no pustules, no warmth, no superficial abscesses, no bullous impetigo, no vesicles, no target lesions with dusky purpura or a central bulla. No concern for SJS, TEN, TSS, tick borne illness, syphilis or other life-threatening condition. He is already on doxycycline which should cover prophylaxis for infection. Will add lotrisone cream for steroid coverage and zinc powder to keep the area dry. Evaluation and diagnostic testing in the emergency department does not suggest an emergent condition requiring  admission or immediate intervention beyond what has been performed at this time.  Plan for discharge with close PCP and oncology follow-up.  Patient is understanding and amenable with plan, educated on red flag symptoms that would prompt immediate return.  Patient discharged in stable condition.   This is a shared visit with supervising physician Dr. Renaye Rakers who has independently evaluated patient & provided guidance in evaluation/management/disposition, in agreement with care    Final Clinical Impression(s) / ED Diagnoses Final diagnoses:  Intertrigo    Rx / DC Orders ED Discharge Orders          Ordered    clotrimazole-betamethasone (LOTRISONE) cream        10/05/22 1041    menthol-zinc oxide (GOLD BOND) powder  2 times daily        10/05/22 1041          An After Visit Summary was printed and given to the patient.     Vear Clock 10/05/22 1055    Terald Sleeper, MD 10/05/22 [phone]

## 2022-10-05 NOTE — Progress Notes (Signed)
Noted patient called [facility] at [phone] today re:his groin rash is continuing to worsen despite nystatin powder and fluconazole x 3 doses. When this RN received message, it was noted that he was in [facility] ER being seen. Was seen and prescribed Lotrisone cream bid + Gold Bond (menthol-zinc oxide) powder bid as well. MD made aware.

## 2022-10-05 NOTE — Discharge Instructions (Signed)
As we discussed, your rash is consistent with something known as intertrigo which is a fungal infection.  I have given you a prescription for a cream that has a steroid component that you should use in the area as prescribed every day.  I have also given you a prescription for zinc powder to place in the area regularly to keep it dry.  Ultimately keeping the area dry he is going to help this heal the quickest.  Please call your oncologist at your earliest convenience for follow-up as well as your primary care doctor.  Return if development of any new or worsening symptoms.

## 2022-10-06 ENCOUNTER — Telehealth: Payer: Self-pay

## 2022-10-06 NOTE — Telephone Encounter (Signed)
The patient's son contacted [place] to relay that the patient visited the emergency department yesterday for a rash and was prescribed Gold Bond powder and Lotrisone. However, it appears that the Gold Bond powder and Lotrisone are effectively treating the rash. Today, the patient will complete his course of fluconazole. He is inquiring whether a new prescription for fluconazole is necessary.

## 2022-10-08 ENCOUNTER — Other Ambulatory Visit: Payer: Self-pay | Admitting: Oncology

## 2022-10-09 ENCOUNTER — Inpatient Hospital Stay: Payer: Commercial Managed Care - HMO

## 2022-10-09 ENCOUNTER — Inpatient Hospital Stay: Payer: Commercial Managed Care - HMO | Admitting: Oncology

## 2022-10-09 ENCOUNTER — Encounter: Payer: Self-pay | Admitting: *Deleted

## 2022-10-09 VITALS — BP 144/64 | HR 67 | Temp 97.8°F | Resp 18 | Wt 161.0 lb

## 2022-10-09 DIAGNOSIS — C189 Malignant neoplasm of colon, unspecified: Secondary | ICD-10-CM

## 2022-10-09 DIAGNOSIS — Z5111 Encounter for antineoplastic chemotherapy: Secondary | ICD-10-CM | POA: Diagnosis not present

## 2022-10-09 LAB — CBC WITH DIFFERENTIAL (CANCER CENTER ONLY)
Abs Immature Granulocytes: 0.2 10*3/uL — ABNORMAL HIGH (ref 0.00–0.07)
Basophils Absolute: 0 10*3/uL (ref 0.0–0.1)
Basophils Relative: 0 %
Eosinophils Absolute: 0.1 10*3/uL (ref 0.0–0.5)
Eosinophils Relative: 1 %
HCT: 39.8 % (ref 39.0–52.0)
Hemoglobin: 12.8 g/dL — ABNORMAL LOW (ref 13.0–17.0)
Immature Granulocytes: 2 %
Lymphocytes Relative: 12 %
Lymphs Abs: 1.6 10*3/uL (ref 0.7–4.0)
MCH: 29.4 pg (ref 26.0–34.0)
MCHC: 32.2 g/dL (ref 30.0–36.0)
MCV: 91.3 fL (ref 80.0–100.0)
Monocytes Absolute: 1.4 10*3/uL — ABNORMAL HIGH (ref 0.1–1.0)
Monocytes Relative: 11 %
Neutro Abs: 9.6 10*3/uL — ABNORMAL HIGH (ref 1.7–7.7)
Neutrophils Relative %: 74 %
Platelet Count: 240 10*3/uL (ref 150–400)
RBC: 4.36 MIL/uL (ref 4.22–5.81)
RDW: 16 % — ABNORMAL HIGH (ref 11.5–15.5)
WBC Count: 12.9 10*3/uL — ABNORMAL HIGH (ref 4.0–10.5)
nRBC: 0 % (ref 0.0–0.2)

## 2022-10-09 LAB — CMP (CANCER CENTER ONLY)
ALT: 16 U/L (ref 0–44)
AST: 19 U/L (ref 15–41)
Albumin: 3.8 g/dL (ref 3.5–5.0)
Alkaline Phosphatase: 104 U/L (ref 38–126)
Anion gap: 5 (ref 5–15)
BUN: 22 mg/dL (ref 8–23)
CO2: 23 mmol/L (ref 22–32)
Calcium: 9.4 mg/dL (ref 8.9–10.3)
Chloride: 109 mmol/L (ref 98–111)
Creatinine: 0.75 mg/dL (ref 0.61–1.24)
GFR, Estimated: >60 mL/min (ref >60)
Glucose, Bld: 107 mg/dL — ABNORMAL HIGH (ref 70–99)
Potassium: 4.6 mmol/L (ref 3.5–5.1)
Sodium: 137 mmol/L (ref 135–145)
Total Bilirubin: 0.3 mg/dL (ref 0.3–1.2)
Total Protein: 6.3 g/dL — ABNORMAL LOW (ref 6.5–8.1)

## 2022-10-09 LAB — MAGNESIUM: Magnesium: 1.2 mg/dL — ABNORMAL LOW (ref 1.7–2.4)

## 2022-10-09 LAB — CEA (ACCESS): CEA (CHCC): 22.93 ng/mL — ABNORMAL HIGH (ref 0.00–5.00)

## 2022-10-09 MED ORDER — MAGNESIUM OXIDE -MG SUPPLEMENT 400 (240 MG) MG PO TABS: 400.0000 mg | ORAL_TABLET | Freq: Every day | ORAL | 2 refills | Status: DC

## 2022-10-09 MED ORDER — DOXYCYCLINE HYCLATE 100 MG PO TABS: 100.0000 mg | ORAL_TABLET | Freq: Two times a day (BID) | ORAL | 3 refills | Status: DC

## 2022-10-09 MED ORDER — HEPARIN SOD (PORK) LOCK FLUSH 100 UNIT/ML IV SOLN
500.0000 [IU] | Freq: Once | INTRAVENOUS | Status: AC
  Administered 2022-10-09: 500 [IU] via INTRAVENOUS

## 2022-10-09 MED ORDER — MAGNESIUM SULFATE 4 GM/100ML IV SOLN
4.0000 g | Freq: Once | INTRAVENOUS | Status: AC
  Administered 2022-10-09: 4 g via INTRAVENOUS
  Filled 2022-10-09: qty 100

## 2022-10-09 MED ORDER — SODIUM CHLORIDE 0.9% FLUSH
10.0000 mL | Freq: Once | INTRAVENOUS | Status: AC
  Administered 2022-10-09: 10 mL via INTRAVENOUS

## 2022-10-09 MED ORDER — SODIUM CHLORIDE 0.9 % IV SOLN: Freq: Once | INTRAVENOUS | Status: AC

## 2022-10-09 NOTE — Patient Instructions (Signed)

## 2022-10-09 NOTE — Progress Notes (Signed)
Spanish Interpreter present during flush and infusion appointment.

## 2022-10-09 NOTE — Progress Notes (Signed)
[facility] OFFICE PROGRESS NOTE   Diagnosis: Colon cancer  INTERVAL HISTORY:   John Vance returns as scheduled.  He continues to have a painful rash in the groin.  He was seen in the emergency room 10/05/2022.  He was treated with Lotrisone cream and zinc oxide powder.  He reports the rash is improving.  Mouth soreness improved after he had a dental cleaning.  He reports approximately 4 loose stools per day.  His appetite has improved over the past few days.  Objective:  Vital signs in last 24 hours:  Blood pressure (!) 144/64, pulse 67, temperature 97.8 F (36.6 C), temperature source Oral, resp. rate 18, weight 161 lb (73 kg), SpO2 100 %.    HEENT: Ulceration at the posterior left buccal mucosa, no thrush, no gum or lip ulcers Resp: Lungs clear bilaterally Cardio: Regular rate and rhythm GI: No hepatosplenomegaly Vascular: No leg edema  Skin: Erythematous rash over the face, trunk, and thighs/popliteal areas, erythematous rash with superficial skin breakdown at the groin bilaterally  Portacath/PICC-without erythema  Lab Results:  Lab Results  Component Value Date   WBC 12.9 (H) 10/09/2022   HGB 12.8 (L) 10/09/2022   HCT 39.8 10/09/2022   MCV 91.3 10/09/2022   PLT 240 10/09/2022   NEUTROABS 9.6 (H) 10/09/2022    CMP  Lab Results  Component Value Date   NA 137 09/26/2022   K 4.3 09/26/2022   CL 107 09/26/2022   CO2 23 09/26/2022   GLUCOSE 110 (H) 09/26/2022   BUN 18 09/26/2022   CREATININE 0.89 09/26/2022   CALCIUM 9.1 09/26/2022   PROT 6.3 (L) 09/26/2022   ALBUMIN 3.8 09/26/2022   AST 19 09/26/2022   ALT 13 09/26/2022   ALKPHOS 95 09/26/2022   BILITOT 0.3 09/26/2022   GFRNONAA >60 09/26/2022   GFRAA >60 12/01/2019    Lab Results  Component Value Date   CEA1 12.10 (H) 01/03/2021   CEA 16.92 (H) 09/12/2022    Lab Results  Component Value Date   INR 1.0 05/02/2019   LABPROT 13.2 05/02/2019    Imaging:  No results  found.  Medications: I have reviewed the patient's current medications.   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), transverse colon cancer stage IIB (T4aN0) status post extended right hemicolectomy 05/07/2019, MSS Tumor invasive superficial portion of the muscle ("internal third "), no tumor perforation, no vascular invasion or perineural invasion, negative resection margins, 0/4 lymph nodes Elevated CEA 02/10/2019 CTs 03/05/2019, compared to outside CTs from 11/22/2018-worsened wall thickening in the cecum stable apple core lesion in the mid transverse colon, no evidence of recurrent tumor at the distal colonic anastomosis, no evidence of metastatic disease Colonoscopy 03/24/2019 20-2 malignant appearing masses noted in the colon, 1 filled the cecum measuring approximately 4 cm across and friable.  The other malignant appearing mass was circumferential creating a stricture with a 1.2 cm lumen located in the transverse segment approximately 4 to 5 cm in length.  There were 12-18 neoplastic appearing polyps in between the 2 obvious malignant masses ranging in size from 3 mm to 2 cm.  5 polyps distal to the transverse colon mass.  2 sigmoid colon polyps.  Cecum mass positive for adenocarcinoma.  Transverse colon mass positive for adenocarcinoma. Foundation 1 transverse colon-MSS, tumor mutation burden 7, KRAS wild-type, BRAF fusion 05/07/2019 laparoscopic extended right hemicolectomy, lysis of adhesions-5 cm invasive moderately  differentiated adenocarcinoma involving cecum and proximal ascending colon, carcinoma invades into the pericolonic soft tissue; 7 cm invasive moderately differentiated carcinoma involving the transverse colon, carcinoma invades into the serosal surface (visceral peritoneum); all resection margins negative for carcinoma.  Lymphovascular invasion present.  In the proximal  colon metastatic carcinoma involves 3 of 17 lymph nodes with 1 tumor deposit.  In the transverse colon 12 lymph nodes negative for metastatic carcinoma.  3 separate polyps: Tubular adenoma, inflammatory polyp and hyperplastic polyp Cycle 1 capecitabine 06/02/2019 Cycle 2 capecitabine 06/23/2019 Cycle 3 capecitabine 07/14/2019 Cycle 4 capecitabine 08/04/2019 Cycle 5 capecitabine 08/25/2019 Cycle 6 capecitabine 09/15/2019 Cycle 7 capecitabine 10/06/2019 Cycle 8 capecitabine 10/27/2019 Upper endoscopy 07/23/2020-negative Colonoscopy 07/23/2020-normal-appearing anastomoses, polyp removed from the descending colon-tubular adenoma CTs 01/18/2021-right abdominal mesenteric mass, mass in the low anatomic pelvis consistent with metastases Cycle 1 FOLFOX 02/21/2021 Cycle 2 FOLFOX 03/07/2021, dose reductions made due to mild neutropenia Cycle 3 FOLFOX 03/21/2021 Cycle 4 FOLFOX 04/04/2021 Cycle 5 FOLFOX 04/19/2021 Cycle 6 FOLFOX 05/02/2021 CTs 05/12/2021-decrease size of nodular right mesenteric mass, complete resolution of soft tissue enhancing nodule in the right inguinal canal, resolution of nodular soft tissue mass posterior to the bladder, enlargement of a left upper lobe nodule and new 4 mm left upper lobe nodule-indeterminate Cycle 7 FOLFOX 05/16/2021 Cycle 8 FOLFOX 05/30/2021, oxaliplatin and 5-FU bolus held due to neuropathy and thrombocytopenia Cycle 9 FOLFOX 06/13/2021, oxaliplatin dose reduced Cycle 10 FOLFOX 06/27/2021 CT 07/07/2021-decrease in right mesenteric mass, decreased size of lobulated left upper lobe nodule, resolution of previous "new "left upper lobe nodule, new area of wall thickening at the distal transverse colon Treatment changed to Xeloda 2 weeks on/1 week off beginning 07/18/2021 Cycle 2 Xeloda beginning 08/09/2021 Cycle 3 Xeloda beginning 08/30/2021 Cycle 4 Xeloda beginning 09/21/2021 Cycle 5 Xeloda beginning 10/12/2021 Cycle 6 Xeloda beginning 11/02/2021 CTs 11/14/2021-right lower quadrant spiculated  small bowel mesenteric mass measured 2.8 x 3.7 cm, previously 1.0 x 2.3 cm; adjacent mesenteric haziness and nodularity measuring up to 8 mm superiorly.  New 4 mm nodular lesion posterior segment right upper lobe along the major fissure. Treatment break beginning 11/17/2021 CTs 03/21/2022-multiple new liver metastases, enlarging implant in the right ileocolic mesentery, enlarging peritoneal implants in the low pelvis Cycle 1 FOLFIRI/Avastin 03/29/2022 Cycle 2 FOLFIRI/Avastin 04/11/2022 Cycle 3 FOLFIRI/Avastin 04/26/2022 Cycle 4 FOLFIRI/Avastin 05/10/2022 Cycle 5 FOLFIRI/Avastin 05/24/2022 CT abdomen/pelvis 06/02/2022-liver metastases are stable and smaller, smaller peritoneal/mesenteric nodules Cycle 6 FOLFIRI/Avastin 06/06/2022 Cycle 7 FOLFIRI/Avastin 06/20/2022 Cycle 8 FOLFIRI/Avastin 07/04/2022 Treatment held 07/18/2022 due to neutropenia Cycle 9 FOLFIRI/Avastin 07/24/2022, Udenyca Cycle 10 FOLFIRI/Avastin 08/08/2022, Udenyca CTs 08/18/2022-slight enlargement of several liver lesions, right mesenteric mass and low pelvic soft tissue nodule are stable Cycle 1 FOLFIRI/Panitumumab 08/29/2022 Cycle 2 FOLFIRI/Panitumumab 09/12/2022 Cycle 3 FOLFIRI/panitumumab 09/26/2022 2.   Microcytic anemia secondary to #1.    Resolved 3.   Family history of pancreas and colon cancer, negative genetic testing per the [employer] panel, ATM variant of unknown significance 4.   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  He completed 3 treatments with FOLFIRI/panitumumab.  He has developed significant skin/mucocutaneous toxicity.  The skin breakdown in the groin is most likely secondary to panitumumab, but 5-FU may be contributing.  He is scheduled for a vacation beginning later this week.  We decided to hold chemotherapy today.  He  will continue the zinc oxide powder for the groin rash.  He will continue doxycycline.  Hopefully the rash will improve over the next several days.  He will return for an office visit in the neck cycle chemotherapy in 2 weeks.  We will plan on dose reducing the panitumumab with the neck cycle of chemotherapy.  He will receive intravenous magnesium supplementation today.  He will begin an oral magnesium supplement.  Thornton Papas, MD  10/09/2022  9:23 AM

## 2022-10-09 NOTE — Progress Notes (Signed)
Visit today assisted by spanish interpreter, Greig Right with [facility] and son.

## 2022-10-09 NOTE — Progress Notes (Signed)
Patient seen by Dr. Truett Perna today  Vitals are all within treatment parameters  Labs reviewed by Lonna Cobb NP and are not all within treatment parameters. Mg+ 1.2  Per physician team, patient will not be receiving treatment today. Will receive 4 grams IV Mg+ today instead of chemotherapy.

## 2022-10-09 NOTE — Patient Instructions (Signed)
Hypomagnesemia Hypomagnesemia is a condition in which the level of magnesium in the blood is too low. Magnesium is a mineral that is found in many foods. It is used in many different processes in the body. Hypomagnesemia can affect every organ in the body. In severe cases, it can cause life-threatening problems. What are the causes? This condition may be caused by: Not getting enough magnesium in your diet or not having enough healthy foods to eat (malnutrition). Problems with magnesium absorption in the intestines. Dehydration. Excessive use of alcohol. Vomiting. Severe or long-term (chronic) diarrhea. Some medicines, including medicines that make you urinate more often (diuretics). Certain diseases, such as kidney disease, diabetes, celiac disease, and overactive thyroid. What are the signs or symptoms? Symptoms of this condition include: Loss of appetite, nausea, and vomiting. Involuntary shaking or trembling of a body part (tremor). Muscle weakness or tingling in the arms and legs. Sudden tightening of muscles (muscle spasms). Confusion. Psychiatric issues, such as: Depression and irritability. Psychosis. A feeling of fluttering of the heart (palpitations). Seizures. These symptoms are more severe if magnesium levels drop suddenly. How is this diagnosed? This condition may be diagnosed based on: Your symptoms and medical history. A physical exam. Blood and urine tests. How is this treated? Treatment depends on the cause and the severity of the condition. It may be treated by: Taking a magnesium supplement. This can be taken in pill form. If the condition is severe, magnesium is usually given through an IV. Making changes to your diet. You may be directed to eat foods that have a lot of magnesium, such as green leafy vegetables, peas, beans, and nuts. Not drinking alcohol. If you are struggling not to drink, ask your health care provider for help. Follow these instructions at  home: Eating and drinking     Make sure that your diet includes foods with magnesium. Foods that have a lot of magnesium in them include: Green leafy vegetables, such as spinach and broccoli. Beans and peas. Nuts and seeds, such as almonds and sunflower seeds. Whole grains, such as whole grain bread and fortified cereals. Drink fluids that contain salts and minerals (electrolytes), such as sports drinks, when you are active. Do not drink alcohol. General instructions Take over-the-counter and prescription medicines only as told by your health care provider. Take magnesium supplements as directed if your health care provider tells you to take them. Have your magnesium levels monitored as told by your health care provider. Keep all follow-up visits. This is important. Contact a health care provider if: You get worse instead of better. Your symptoms return. Get help right away if: You develop severe muscle weakness. You have trouble breathing. You feel that your heart is racing. These symptoms may represent a serious problem that is an emergency. Do not wait to see if the symptoms will go away. Get medical help right away. Call your local emergency services (911 in the [place] Do not drive yourself to the hospital. Summary Hypomagnesemia is a condition in which the level of magnesium in the blood is too low. Hypomagnesemia can affect every organ in the body. Treatment may include eating more foods that contain magnesium, taking magnesium supplements, and not drinking alcohol. Have your magnesium levels monitored as told by your health care provider. This information is not intended to replace advice given to you by your health care provider. Make sure you discuss any questions you have with your health care provider. Document Revised: 09/07/2020 Document Reviewed: 09/07/2020 [employer] Patient Education    2024 [employer]  

## 2022-10-10 ENCOUNTER — Inpatient Hospital Stay: Payer: Commercial Managed Care - HMO | Admitting: Nurse Practitioner

## 2022-10-10 ENCOUNTER — Inpatient Hospital Stay: Payer: Commercial Managed Care - HMO

## 2022-10-11 ENCOUNTER — Inpatient Hospital Stay: Payer: Commercial Managed Care - HMO

## 2022-10-12 ENCOUNTER — Inpatient Hospital Stay: Payer: Commercial Managed Care - HMO

## 2022-10-12 ENCOUNTER — Other Ambulatory Visit: Payer: Self-pay

## 2022-10-23 ENCOUNTER — Inpatient Hospital Stay: Payer: Managed Care, Other (non HMO) | Attending: Oncology

## 2022-10-23 ENCOUNTER — Inpatient Hospital Stay (HOSPITAL_BASED_OUTPATIENT_CLINIC_OR_DEPARTMENT_OTHER): Payer: Commercial Managed Care - HMO | Admitting: Nurse Practitioner

## 2022-10-23 ENCOUNTER — Inpatient Hospital Stay: Payer: Managed Care, Other (non HMO)

## 2022-10-23 ENCOUNTER — Encounter: Payer: Self-pay | Admitting: *Deleted

## 2022-10-23 ENCOUNTER — Encounter: Payer: Self-pay | Admitting: Nurse Practitioner

## 2022-10-23 VITALS — BP 145/72 | HR 72 | Temp 98.1°F | Resp 18 | Ht 68.0 in | Wt 161.7 lb

## 2022-10-23 DIAGNOSIS — C182 Malignant neoplasm of ascending colon: Secondary | ICD-10-CM | POA: Diagnosis not present

## 2022-10-23 DIAGNOSIS — R339 Retention of urine, unspecified: Secondary | ICD-10-CM | POA: Diagnosis not present

## 2022-10-23 DIAGNOSIS — C184 Malignant neoplasm of transverse colon: Secondary | ICD-10-CM | POA: Diagnosis not present

## 2022-10-23 DIAGNOSIS — C787 Secondary malignant neoplasm of liver and intrahepatic bile duct: Secondary | ICD-10-CM | POA: Insufficient documentation

## 2022-10-23 DIAGNOSIS — Z9049 Acquired absence of other specified parts of digestive tract: Secondary | ICD-10-CM | POA: Insufficient documentation

## 2022-10-23 DIAGNOSIS — C187 Malignant neoplasm of sigmoid colon: Secondary | ICD-10-CM | POA: Insufficient documentation

## 2022-10-23 DIAGNOSIS — C189 Malignant neoplasm of colon, unspecified: Secondary | ICD-10-CM

## 2022-10-23 DIAGNOSIS — Z5111 Encounter for antineoplastic chemotherapy: Secondary | ICD-10-CM | POA: Insufficient documentation

## 2022-10-23 DIAGNOSIS — R300 Dysuria: Secondary | ICD-10-CM

## 2022-10-23 LAB — URINALYSIS, COMPLETE (UACMP) WITH MICROSCOPIC
Bacteria, UA: NONE SEEN
Bilirubin Urine: NEGATIVE
Glucose, UA: NEGATIVE mg/dL
Hgb urine dipstick: NEGATIVE
Ketones, ur: NEGATIVE mg/dL
Leukocytes,Ua: NEGATIVE
Nitrite: NEGATIVE
Specific Gravity, Urine: 1.023 (ref 1.005–1.030)
pH: 6.5 (ref 5.0–8.0)

## 2022-10-23 LAB — CMP (CANCER CENTER ONLY)
ALT: 30 U/L (ref 0–44)
AST: 34 U/L (ref 15–41)
Albumin: 3.6 g/dL (ref 3.5–5.0)
Alkaline Phosphatase: 94 U/L (ref 38–126)
Anion gap: 6 (ref 5–15)
BUN: 18 mg/dL (ref 8–23)
CO2: 26 mmol/L (ref 22–32)
Calcium: 9.2 mg/dL (ref 8.9–10.3)
Chloride: 108 mmol/L (ref 98–111)
Creatinine: 0.72 mg/dL (ref 0.61–1.24)
GFR, Estimated: >60 mL/min (ref >60)
Glucose, Bld: 124 mg/dL — ABNORMAL HIGH (ref 70–99)
Potassium: 3.9 mmol/L (ref 3.5–5.1)
Sodium: 140 mmol/L (ref 135–145)
Total Bilirubin: 0.6 mg/dL (ref 0.3–1.2)
Total Protein: 6.4 g/dL — ABNORMAL LOW (ref 6.5–8.1)

## 2022-10-23 LAB — CBC WITH DIFFERENTIAL (CANCER CENTER ONLY)
Abs Immature Granulocytes: 0.03 10*3/uL (ref 0.00–0.07)
Basophils Absolute: 0.1 10*3/uL (ref 0.0–0.1)
Basophils Relative: 1 %
Eosinophils Absolute: 0.5 10*3/uL (ref 0.0–0.5)
Eosinophils Relative: 5 %
HCT: 40.7 % (ref 39.0–52.0)
Hemoglobin: 13.1 g/dL (ref 13.0–17.0)
Immature Granulocytes: 0 %
Lymphocytes Relative: 12 %
Lymphs Abs: 1.2 10*3/uL (ref 0.7–4.0)
MCH: 29.3 pg (ref 26.0–34.0)
MCHC: 32.2 g/dL (ref 30.0–36.0)
MCV: 91.1 fL (ref 80.0–100.0)
Monocytes Absolute: 1.1 10*3/uL — ABNORMAL HIGH (ref 0.1–1.0)
Monocytes Relative: 12 %
Neutro Abs: 6.6 10*3/uL (ref 1.7–7.7)
Neutrophils Relative %: 70 %
Platelet Count: 180 10*3/uL (ref 150–400)
RBC: 4.47 MIL/uL (ref 4.22–5.81)
RDW: 15.9 % — ABNORMAL HIGH (ref 11.5–15.5)
WBC Count: 9.5 10*3/uL (ref 4.0–10.5)
nRBC: 0 % (ref 0.0–0.2)

## 2022-10-23 LAB — MAGNESIUM: Magnesium: 1 mg/dL — ABNORMAL LOW (ref 1.7–2.4)

## 2022-10-23 LAB — CEA (ACCESS): CEA (CHCC): 66 ng/mL — ABNORMAL HIGH (ref 0.00–5.00)

## 2022-10-23 MED ORDER — SODIUM CHLORIDE 0.9 % IV SOLN
10.0000 mg | Freq: Once | INTRAVENOUS | Status: AC
  Administered 2022-10-23: 10 mg via INTRAVENOUS
  Filled 2022-10-23: qty 10

## 2022-10-23 MED ORDER — PALONOSETRON HCL INJECTION 0.25 MG/5ML
0.2500 mg | Freq: Once | INTRAVENOUS | Status: AC
  Administered 2022-10-23: 0.25 mg via INTRAVENOUS
  Filled 2022-10-23: qty 5

## 2022-10-23 MED ORDER — MAGNESIUM SULFATE 4 GM/100ML IV SOLN
4.0000 g | Freq: Once | INTRAVENOUS | Status: AC
  Administered 2022-10-23: 4 g via INTRAVENOUS
  Filled 2022-10-23: qty 100

## 2022-10-23 MED ORDER — SODIUM CHLORIDE 0.9 % IV SOLN: Freq: Once | INTRAVENOUS | Status: AC

## 2022-10-23 MED ORDER — ATROPINE SULFATE 1 MG/ML IV SOLN
0.5000 mg | Freq: Once | INTRAVENOUS | Status: AC | PRN
  Administered 2022-10-23: 0.5 mg via INTRAVENOUS
  Filled 2022-10-23: qty 1

## 2022-10-23 MED ORDER — SODIUM CHLORIDE 0.9 % IV SOLN
3.0000 mg/kg | Freq: Once | INTRAVENOUS | Status: AC
  Administered 2022-10-23: 200 mg via INTRAVENOUS
  Filled 2022-10-23: qty 10

## 2022-10-23 MED ORDER — SODIUM CHLORIDE 0.9 % IV SOLN
200.0000 mg/m2 | Freq: Once | INTRAVENOUS | Status: AC
  Administered 2022-10-23: 374 mg via INTRAVENOUS
  Filled 2022-10-23: qty 18.7

## 2022-10-23 MED ORDER — FLUOROURACIL CHEMO INJECTION 500 MG/10ML
200.0000 mg/m2 | Freq: Once | INTRAVENOUS | Status: AC
  Administered 2022-10-23: 350 mg via INTRAVENOUS
  Filled 2022-10-23: qty 7

## 2022-10-23 MED ORDER — SODIUM CHLORIDE 0.9 % IV SOLN
180.0000 mg/m2 | Freq: Once | INTRAVENOUS | Status: AC
  Administered 2022-10-23: 340 mg via INTRAVENOUS
  Filled 2022-10-23: qty 15

## 2022-10-23 MED ORDER — SODIUM CHLORIDE 0.9 % IV SOLN
1600.0000 mg/m2 | INTRAVENOUS | Status: DC
  Administered 2022-10-23: 3000 mg via INTRAVENOUS
  Filled 2022-10-23: qty 60

## 2022-10-23 NOTE — Patient Instructions (Addendum)
Instrucciones al darle de alta: The chemotherapy medication bag should finish at 46 hours, 96 hours, or 7 days. For example, if your pump is scheduled for 46 hours and it was put on at 4:00 p.m., it should finish at 2:00 p.m. the day it is scheduled to come off regardless of your appointment time.     Estimated time to finish at 1:15  Wednesday, October 25, 2022.   If the display on your pump reads "Low Volume" and it is beeping, take the batteries out of the pump and come to the cancer center for it to be taken off.   If the pump alarms go off prior to the pump reading "Low Volume" then call [phone] and someone can assist you.  If the plunger comes out and the chemotherapy medication is leaking out, please use your home chemo spill kit to clean up the spill. Do NOT use paper towels or other household products.  If you have problems or questions regarding your pump, please call either [phone] (24 hours a day) or the cancer center Monday-Friday 8:00 a.m.- 4:30 p.m. at the clinic number and we will assist you. If you are unable to get assistance, then go to the nearest Emergency Department and ask the staff to contact the IV team for assistance.  Discharge Instructions Gracias por elegir al [facility] de Cncer de [place] para brindarle atencin mdica de oncologa y Teacher, English as a foreign language.   Si usted tiene una cita de laboratorio con [employer] de [place], por favor vaya directamente al [employer] de Cncer y regstrese en el rea de Engineer, maintenance (IT).   Use ropa cmoda y [place] para tener fcil acceso a las vas del Portacath (acceso venoso de Set designer duracin) o la lnea PICC (catter central colocado por va perifrica).   Nos esforzamos por ofrecerle tiempo de calidad con su proveedor. Es posible que tenga que volver a programar su cita si llega tarde (15 minutos o ms).  El llegar tarde le afecta a usted y a otros pacientes cuyas citas son posteriores a Armed forces operational officer.  Adems, si usted falta a tres o ms citas sin  avisar a la oficina, puede ser retirado(a) de la clnica a discrecin del proveedor.      Para las solicitudes de renovacin de recetas, pida a su farmacia que se ponga en contacto con nuestra oficina y deje que transcurran 72 horas para que se complete el proceso de las renovaciones.    Hoy usted recibi los siguientes agentes de quimioterapia e/o inmunoterapia Irinotecan, Leucovorin, Fluorouracil      Para ayudar a prevenir las nuseas y los vmitos despus de su tratamiento, le recomendamos que tome su medicamento para las nuseas segn las indicaciones.  LOS SNTOMAS QUE DEBEN COMUNICARSE INMEDIATAMENTE SE INDICAN A CONTINUACIN: *FIEBRE SUPERIOR A 100.4 F (38 C) O MS *ESCALOFROS O SUDORACIN *NUSEAS Y VMITOS QUE NO SE CONTROLAN CON EL MEDICAMENTO PARA LAS NUSEAS *DIFICULTAD INUSUAL PARA RESPIRAR  *MORETONES O HEMORRAGIAS NO HABITUALES *PROBLEMAS URINARIOS (dolor o ardor al Geographical information systems officer o frecuencia para Geographical information systems officer) *PROBLEMAS INTESTINALES (diarrea inusual, estreimiento, dolor cerca del ano) SENSIBILIDAD EN LA BOCA Y EN LA GARGANTA CON O SIN LA PRESENCIA DE LCERAS (dolor de garganta, llagas en la boca o dolor de muelas/dientes) ERUPCIN, HINCHAZN O DOLORES INUSUALES FLUJO VAGINAL INUSUAL O PICAZN/RASQUIA    Los puntos marcados con un asterisco ( *) indican una posible emergencia y debe hacer un seguimiento tan pronto como le sea posible o vaya al Departamento de Emergencias si se le  presenta algn problema.  Por favor, muestre la [place] DE ADVERTENCIA DE Marc Morgans DE ADVERTENCIA DE Gard[place] Fanti al registrarse en [address] de Emergencias y a la enfermera de triaje.  Si tiene preguntas despus de su visita o necesita cancelar o volver a programar su cita, por favor pngase en contacto con [facility]  Dept: [phone]  y siga las instrucciones. Las horas de oficina son de 8:00 a.m. a 4:30 p.m. de lunes a viernes. Por favor,  tenga en cuenta que los mensajes de voz que se dejan despus de las 4:00 p.m. posiblemente no se devolvern hasta el siguiente da de [place].  Cerramos los fines de semana y [employer]. En todo momento tiene acceso a una enfermera para preguntas urgentes. Por favor, llame al nmero principal de la clnica Dept: [phone] y siga las instrucciones.   Para cualquier pregunta que no sea de carcter urgente, tambin puede ponerse en contacto con su proveedor [employer]. Ahora ofrecemos visitas electrnicas para cualquier persona mayor de 18 aos que solicite atencin mdica en lnea para los sntomas que no sean urgentes. Para ms detalles vaya a [email or web address].   Tambin puede bajar la aplicacin de MyChart! Vaya a la tienda de aplicaciones, busque "MyChart", abra la aplicacin, seleccione [place], e ingrese con su nombre de usuario y la contrasea de Clinical cytogeneticist.  Irinotecan Injection Qu es este medicamento? El IRINOTECN trata algunos tipos de cncer. Acta desacelerando el crecimiento de clulas cancerosas. Este medicamento puede ser utilizado para otros usos; si tiene alguna pregunta consulte con su proveedor de atencin mdica o con su farmacutico. MARCAS COMUNES: Camptosar Qu le debo informar a mi profesional de la salud antes de tomar este medicamento? Necesitan saber si usted presenta alguno de los [facility] o situaciones: Deshidratacin Diarrea Infeccin, especialmente una infeccin viral, tales como varicela, fuegos labiales, herpes Enfermedad heptica Niveles bajos de clulas sanguneas (glbulos blancos, glbulos rojos y plaquetas) Niveles bajos de Customer service manager, tales como calcio, magnesio o potasio en la sangre Radiacin en curso o reciente Una reaccin alrgica o inusual al irinotecn, a otros medicamentos, alimentos, colorantes o conservantes Si usted o su pareja est embarazada o intentando quedar embarazada Si est  amamantando a un beb Cmo debo Visual merchandiser medicamento? Este medicamento se inyecta en una vena. Su equipo de atencin lo [employer] en un hospital o en un entorno clnico. Hable con su equipo de atencin sobre el uso de este medicamento en nios. Puede requerir atencin especial. Sobredosis: Pngase en contacto inmediatamente con un centro toxicolgico o una sala de urgencia si usted cree que haya tomado demasiado medicamento. ATENCIN: [employer] es solo para usted. No comparta este medicamento con nadie. Qu sucede si me olvido de una dosis? Cumpla con las citas para dosis de seguimiento. Es importante no olvidar ninguna dosis. Llame a su equipo de atencin si no puede asistir a una cita. Qu puede interactuar con este medicamento? No use este medicamento con ninguno de los siguientes productos: Cobicistat Itraconazol Este medicamento tambin podra interactuar con los siguientes productos: Ciertos antibiticos, tales como claritromicina, rifampicina, rifabutina Ciertos medicamentos antivirales para el VIH o SIDA Ciertos medicamentos para las infecciones micticas, tales como ketoconazol, posaconazol, voriconazol Ciertos medicamentos para convulsiones, tales como [place], fenobarbital, fenitona Gemfibrozil Nefazodona Hierba de [address] ser que esta lista no menciona todas las posibles interacciones. Informe a su profesional de [address] de [employer] productos a base de hierbas, medicamentos de [place]  o suplementos nutritivos que est tomando. Si usted fuma, consume bebidas alcohlicas o si utiliza drogas ilegales, indqueselo tambin a su profesional de [employer]. Algunas sustancias pueden interactuar con su medicamento. A qu debo estar atento al usar [employer]? Se supervisar su estado de salud atentamente mientras reciba este medicamento. Usted podra necesitar realizarse [employer] de sangre mientras est usando [place]. Este medicamento podra  hacerle sentir un Risk analyst. Esto no es inusual, ya que la quimioterapia puede afectar tanto a las clulas sanas como a las clulas cancerosas. Si presenta algn efecto secundario, infrmelo. Contine con el tratamiento incluso si se siente enfermo, a menos que su equipo de [employer] lo suspenda. Este medicamento puede causar efectos secundarios graves. Para reducir Nurse, adult, su equipo de atencin puede darle otros medicamentos que deber usar antes de Regulatory affairs officer. Asegrese de seguir las instrucciones de su equipo de atencin. Este medicamento podra afectar su coordinacin, tiempo de reaccin o juicio. No conduzca ni opere maquinaria pesada hasta que sepa cmo le afecta este medicamento. Pngase de pie o levntese lentamente para reducir el riesgo de mareos o [place]. Beber alcohol con [employer] puede aumentar el riesgo de estos efectos secundarios. Este medicamento puede aumentar su riesgo de contraer una infeccin. Llame para pedir consejo a su equipo de atencin si tiene fiebre, escalofros, dolor de garganta o cualquier otro sntoma de resfriado o gripe. No se trate usted mismo. Trate de no acercarse a personas que estn enfermas. Evite usar medicamentos que contengan aspirina, acetaminofeno, ibuprofeno, naproxeno o ketoprofeno, a menos que as lo indique su equipo de atencin. Estos medicamentos pueden ocultar la fiebre. Este medicamento podra aumentar el riesgo de moretones o sangrado. Llame a su equipo de atencin si observa sangrados inusuales. Proceda con cuidado al cepillar sus dientes, usar hilo dental o Chemical engineer palillos para los dientes, ya que podra contraer una infeccin o Geophysicist/field seismologist con mayor facilidad. Si recibe algn tratamiento dental, informe a su dentista que est [employer]. Informe a su equipo de atencin si usted o su pareja est embarazada o si cree que alguna de las [place] podra [address]. Este medicamento puede causar defectos  congnitos graves si se [place] durante el embarazo y por 6 meses despus de la ltima dosis. Deber realizarse una prueba de embarazo y obtener resultado negativo antes de Games developer a Producer, television/film/video. Se recomienda utilizar un mtodo anticonceptivo mientras est usando este medicamento y por 6 meses despus de la ltima dosis. Su equipo de atencin mdica puede ayudarle a Clinical research associate la opcin que mejor se adapte a sus necesidades. No embarace a Careers information officer est usando este medicamento y durante 3 meses despus de la ltima dosis. Use un condn como anticonceptivo [employer]. No debe amamantar a un beb mientras [place] este medicamento y por 7 das despus de la ltima dosis. Este medicamento podra causar infertilidad. Hable con su equipo de atencin si le preocupa su fertilidad. Qu efectos secundarios puedo tener al [employer] este medicamento? Efectos secundarios que debe informar a su equipo de atencin tan pronto como sea posible: Reacciones alrgicas: erupcin cutnea, comezn/picazn, urticaria, hinchazn de la cara, los labios, la lengua o la garganta Tos seca, falta de aire o problemas para respirar Aumento de saliva o lgrimas, aumento de la sudoracin, calambres estomacales, diarrea, pupilas pequeas, debilidad o fatiga inusuales, frecuencia cardiaca lenta Infeccin: fiebre, escalofros, tos, dolor de garganta, heridas que no sanan, dolor o problemas para orinar, sensacin general de molestia o  malestar [employer] riones: disminucin en la cantidad de orina, hinchazn de los tobillos, las manos o los pies Recuento bajo de glbulos rojos: debilidad o fatiga inusuales, [place], dolor de [place], dificultad para respirar Diarrea grave o prolongada Sangrado o moretones inusuales Efectos secundarios que generalmente no requieren atencin mdica (debe informarlos a su equipo de atencin si persisten o si son molestos): Estreimiento Diarrea Cada del cabello Prdida del  apetito Nuseas Dolor estomacal Puede ser que esta lista no menciona todos los posibles efectos secundarios. Comunquese a su mdico por asesoramiento mdico [employer]lett[employer]ard. Usted puede informar los efectos secundarios a la FDA por telfono al 1-800-FDA-1088. Dnde debo guardar mi medicina? Este medicamento se administra en hospitales o clnicas. No se guarda en su casa. ATENCIN: Este folleto es un resumen. Puede ser que no cubra toda la posible informacin. Si usted tiene preguntas acerca de esta medicina, consulte con su mdico, su farmacutico o su profesional de Radiographer, therapeutic.  2024 Elsevier/Gold Standard (2022-03-01 00:00:00) Leucovorin Injection Qu es este medicamento? La LEUCOVORINA previene los efectos secundarios de ciertos medicamentos, Consulting civil engineer. Acta [employer] niveles de folato. Esto ayuda a proteger las clulas sanas del cuerpo. Tambin podra usarse para tratar la anemia causada por niveles bajos de folato. Tambin se puede Chemical engineer con fluorouracilo, un tipo de quimioterapia, para Tax adviser. Acta [employer] del fluorouracilo en el cuerpo. Este medicamento puede ser utilizado para otros usos; si tiene alguna pregunta consulte con su proveedor de atencin mdica o con su farmacutico. Qu le debo informar a mi profesional de la salud antes de tomar este medicamento? Necesitan saber si usted presenta alguno de los [facility] o situaciones: Anemia por niveles bajos de vitamina B12 en la sangre Una reaccin alrgica o inusual a la leucovorina, al cido flico, a otros medicamentos, alimentos, colorantes o conservantes Si est embarazada o buscando quedar embarazada Si est amamantando a un beb Cmo debo [employer]? Este medicamento se inyecta en una vena o un msculo. Su equipo de atencin lo [employer] en un hospital o en un entorno clnico. Hable con su equipo de atencin sobre el uso de este  medicamento en nios. Puede requerir atencin especial. Sobredosis: Pngase en contacto inmediatamente con un centro toxicolgico o una sala de urgencia si usted cree que haya tomado demasiado medicamento. ATENCIN: [employer] American es solo para usted. No comparta este medicamento con nadie. Qu sucede si me olvido de una dosis? Cumpla con las citas para dosis de seguimiento. Es importante no olvidar ninguna dosis. Llame a su equipo de atencin si no puede asistir a una cita. Qu puede interactuar con este medicamento? Capecitabina Fluorouracilo Fenobarbital Fenitona Primidona Trimetoprima;sulfametoxasol Puede ser que esta lista no menciona todas las posibles interacciones. Informe a su profesional de [employer] de [employer] Inc productos a base de hierbas, medicamentos de [place] o suplementos nutritivos que est tomando. Si usted fuma, consume bebidas alcohlicas o si utiliza drogas ilegales, indqueselo tambin a su profesional de [employer]. Algunas sustancias pueden interactuar con su medicamento. A qu debo estar atento al usar [employer]? Se supervisar su estado de salud atentamente mientras reciba este medicamento. Este medicamento podra aumentar los efectos secundarios del 5-fluorouracilo. Informe a su equipo de atencin si tiene diarrea o llagas en la boca que no mejoran o empeoran. Qu efectos secundarios puedo tener al [employer] este medicamento? Efectos secundarios que debe informar a su equipo de atencin tan pronto como sea posible: Reacciones alrgicas:  erupcin cutnea, comezn/picazn, urticaria, hinchazn de la cara, los labios, la lengua o la garganta Puede ser que esta lista no menciona todos los posibles efectos secundarios. Comunquese a su mdico por asesoramiento mdico [employer]lett[employer]ard. Usted puede informar los efectos secundarios a la FDA por telfono al 1-800-FDA-1088. Dnde debo guardar mi medicina? Este medicamento se administra en hospitales o  clnicas. No se guarda en su casa. ATENCIN: Este folleto es un resumen. Puede ser que no cubra toda la posible informacin. Si usted tiene preguntas acerca de esta medicina, consulte con su mdico, su farmacutico o su profesional de Radiographer, therapeutic.  2024 Elsevier/Gold Standard (2022-03-01 00:00:00) Fluorouracil Injection Qu es este medicamento? El FLUOROURACILO trata algunos tipos de cncer. Acta desacelerando el crecimiento de clulas cancerosas. Este medicamento puede ser utilizado para otros usos; si tiene alguna pregunta consulte con su proveedor de atencin mdica o con su farmacutico. MARCAS COMUNES: Adrucil Qu le debo informar a mi profesional de la salud antes de tomar este medicamento? Necesitan saber si usted presenta alguno de los siguientes problemas o situaciones: Trastornos sanguneos Deficiencia de dihidropirimidina deshidrogenasa (DPD) Infecciones, tales como varicela, fuegos labiales, herpes Enfermedad renal Enfermedad heptica Desnutricin [place] de radiacin en curso o reciente [place] reaccin alrgica o inusual al fluorouracilo, a otros medicamentos, alimentos, colorantes o conservantes Si usted o su pareja est embarazada o intentando quedar embarazada Si est amamantando a un beb Cmo debo Visual merchandiser medicamento? Este medicamento se inyecta en una vena. Su equipo de atencin lo [employer] en un hospital o en un entorno clnico. Hable con su equipo de atencin sobre el uso de este medicamento en nios. Puede requerir atencin especial. Sobredosis: Pngase en contacto inmediatamente con un centro toxicolgico o una sala de urgencia si usted cree que haya tomado demasiado medicamento. ATENCIN: [employer] es solo para usted. No comparta este medicamento con nadie. Qu sucede si me olvido de una dosis? Cumpla con las citas para dosis de seguimiento. Es importante no olvidar ninguna dosis. Llame a su equipo de atencin si no puede asistir a una cita. Qu  puede interactuar con este medicamento? No use este medicamento con ninguno de los siguientes productos: Administrator, arts de virus vivos Este medicamento tambin podra Product/process development scientist con los siguientes productos: Medicamentos que tratan o previenen cogulos sanguneos, tales como warfarina, enoxaparina, dalteparina Puede ser que esta lista no menciona todas las posibles interacciones. Informe a su profesional de [employer] de [employer] Inc productos a base de hierbas, medicamentos de [place] o suplementos nutritivos que est tomando. Si usted fuma, consume bebidas alcohlicas o si utiliza drogas ilegales, indqueselo tambin a su profesional de [employer]. Algunas sustancias pueden interactuar con su medicamento. A qu debo estar atento al usar [employer]? Se supervisar su estado de salud atentamente mientras reciba este medicamento. Este medicamento podra hacerle sentir un Risk analyst. Esto no es inusual, ya que la quimioterapia puede afectar tanto a las clulas sanas como a las clulas cancerosas. Si presenta algn efecto secundario, infrmelo. Contine con el tratamiento incluso si se siente enfermo, a menos que su equipo de [employer] lo suspenda. En algunos casos, podra recibir [employer] para ayudarle con los efectos secundarios. Siga todas las instrucciones para usarlos. Este medicamento puede aumentar su riesgo de contraer una infeccin. Llame para pedir consejo a su equipo de atencin si tiene fiebre, escalofros, dolor de garganta o cualquier otro sntoma de resfriado o gripe. No se trate usted mismo. Trate de no acercarse a Education officer, museum  estn enfermas. Este medicamento podra aumentar el riesgo de moretones o sangrado. Llame a su equipo de atencin si observa sangrados inusuales. Proceda con cuidado al cepillar sus dientes, usar hilo dental o Chemical engineer palillos para los dientes, ya que podra contraer una infeccin o Geophysicist/field seismologist con mayor facilidad. Si recibe algn  tratamiento dental, informe a su dentista que est [employer]. Evite usar medicamentos que contengan aspirina, acetaminofeno, ibuprofeno, naproxeno o ketoprofeno, a menos que as lo indique su equipo de atencin. Estos medicamentos pueden ocultar la fiebre. No trate la diarrea con productos de [employer]. Contacte a su equipo de atencin si tiene diarrea por ms de [address], o si es grave y [place]. Este medicamento puede aumentar su sensibilidad al sol. Evite la [employer]. Si no la Network engineer, utilice ropa protectora y crema de Orthoptist. No utilice lmparas solares, camas solares ni cabinas solares. Informe a su equipo de atencin si usted o su pareja est buscando un embarazo o si cree que podra estar embarazada. Este medicamento puede causar defectos congnitos graves si se [place] durante el embarazo y por 3 meses despus de la ltima dosis. Se recomienda Chemical engineer un mtodo anticonceptivo confiable mientras est usando este medicamento y durante 3 meses despus de la ltima dosis. Hable con su equipo de atencin sobre mtodos anticonceptivos eficaces. No embarace a Careers information officer est usando este medicamento y durante 3 meses despus de la ltima dosis. Use un condn durante las relaciones sexuales que mantenga en [place]. No debe amamantar a un beb mientras est [employer]. Este medicamento podra causar infertilidad. Hable con su equipo de atencin si le preocupa su fertilidad. Qu efectos secundarios puedo tener al [employer] este medicamento? Efectos secundarios que debe informar a su equipo de atencin tan pronto como sea posible: Reacciones alrgicas: erupcin cutnea, comezn/picazn, urticaria, hinchazn de la cara, los labios, la lengua o la garganta Ataque cardiaco: Engineer, mining u opresin en el pecho, los hombros, los brazos o la [place], nuseas, falta de [place], piel fra o sudorosa, sensacin de [place] o aturdimiento Insuficiencia cardiaca: falta de  aire, hinchazn de los tobillos, los pies o las manos, aumento de peso repentino, debilidad o fatiga inusuales Cambios en el ritmo cardiaco: frecuencia cardiaca rpida o irregular, mareos, sensacin de desmayo o aturdimiento, dolor en el pecho, dificultad para respirar Niveles elevados de amonaco: debilidad o fatiga inusuales, confusin, prdida de apetito, nuseas, vmitos, convulsiones Infeccin: fiebre, escalofros, tos, dolor de garganta, heridas que no sanan, dolor o problemas para Geographical information systems officer, sensacin general de molestia o malestar Recuento bajo de glbulos rojos: debilidad o fatiga inusuales, mareo, dolor de cabeza, dificultad para Dietitian, hormigueo o entumecimiento en las manos o los pies, debilidad muscular, cambios en la visin, confusin o dificultad para hablar, prdida del equilibrio o la coordinacin, dificultad para caminar, convulsiones Enrojecimiento, hinchazn y [employer] en la piel de las manos y los pies Diarrea grave o prolongada Sangrado o moretones inusuales Efectos secundarios que generalmente no requieren atencin mdica (debe informarlos a su equipo de atencin si persisten o si son molestos): Piel seca Dolor de cabeza Aumento de Armed forces technical officer, enrojecimiento o hinchazn con llagas dentro de la boca o la garganta Sensibilidad a la luz [place] Puede ser que esta lista no menciona todos los posibles efectos secundarios. Comunquese a su mdico por asesoramiento mdico [employer]ard. Usted puede informar los efectos secundarios a la FDA por telfono al 1-800-FDA-1088. Dnde debo guardar mi medicina? Este medicamento se [employer]  en hospitales o clnicas. No se guarda en su casa. ATENCIN: Este folleto es un resumen. Puede ser que no cubra toda la posible informacin. Si usted tiene preguntas acerca de esta medicina, consulte con su mdico, su farmacutico o su profesional de Radiographer, therapeutic.  2024 Elsevier/Gold Standard (2022-03-01  00:00:00)  Hipomagnesemia Hypomagnesemia La hipomagnesemia es una afeccin que se caracteriza por una concentracin demasiado baja de magnesio en la sangre. El magnesio es un mineral que se encuentra en muchos alimentos. Interviene en muchos procesos diferentes del organismo. La hipomagnesemia puede afectar todos los rganos del cuerpo. En casos graves, puede causar problemas potencialmente mortales. Cules son las causas? Esta afeccin puede ser causada por lo siguiente: No consumir suficiente magnesio en la dieta o no tener una cantidad suficiente de alimentos saludables para comer (desnutricin). Problemas de absorcin del [employer] intestinos. Deshidratacin. Consumo excesivo de alcohol. Vmitos. Diarrea intensa o a largo plazo (crnica). Algunos medicamentos, incluidos los medicamentos que hacen orinar con mayor frecuencia (diurticos). Ciertas enfermedades, por ejemplo, enfermedad renal, diabetes, enfermedad celaca e hiperactividad de la glndula tiroidea. Cules son los signos o sntomas? Los sntomas de esta afeccin incluyen: Prdida del apetito, nuseas y vmitos. Sacudidas o temblores involuntarios de una parte del cuerpo. Debilidad muscular u hormigueo en los brazos y las piernas. Rigidez muscular repentina (espasmos musculares). Confusin. Problemas psiquitricos como, por ejemplo: Depresin e irritabilidad. Psicosis. Sensacin de que el corazn aletea (palpitaciones). Convulsiones. Estos sntomas son ms intensos si las concentraciones de calcio bajan de [place] repentina. Cmo se diagnostica? Esta afeccin se puede diagnosticar en funcin de lo siguiente: Los sntomas y los antecedentes mdicos. Un examen fsico. Anlisis de sangre y [place]. Cmo se trata? El tratamiento depende de la causa y de la gravedad de Astronomer. Puede tratarse de las siguientes maneras: Tomar un suplemento de magnesio. Puede tomarse en comprimidos. Si la afeccin es grave, el  magnesio por lo general se administra por va intravenosa. Realizar cambios en la dieta. Pueden indicarle que consuma alimentos con alto contenido de [place], como verduras de [place], guisantes, frijoles y frutos secos. No beber alcohol. Si est teniendo inconvenientes para dejar de consumir alcohol, pdale ayuda al mdico. Siga estas indicaciones en su casa: Comida y bebida     Asegrese de incorporar a la dieta alimentos que contengan magnesio. Los alimentos con alto contenido de magnesio incluyen: Verduras de hoja verde, como espinaca y brcoli. Frijoles y guisantes. Frutos secos y semillas, como almendras y semillas de [place]. Cereales integrales, como el pan integral y cereales fortificados. Beba lquidos que [employer] y Energy manager (electrolitos), como bebidas deportivas, cuando haga actividad fsica. No beba alcohol. Indicaciones generales Use los medicamentos de venta libre y los recetados solamente como se lo haya indicado el mdico. Tome suplementos de magnesio segn las instrucciones si el mdico se lo indica. Hgase controlar los niveles de magnesio como se lo haya indicado el mdico. [place] a todas las visitas de seguimiento. Esto es importante. Comunquese con un mdico si: Empeora en lugar de mejorar. Los sntomas vuelven a Research officer, trade union. Solicite ayuda de inmediato si: Presenta debilidad muscular grave. Tiene dificultad para respirar. Siente que el corazn late acelerado. Estos sntomas pueden representar un problema grave que constituye Radio broadcast assistant. No espere a ver si los sntomas desaparecen. Solicite atencin mdica de inmediato. Comunquese con el servicio de emergencias de su localidad (911 en los [place]). No conduzca por sus propios medios [employer] [facility]. Resumen La hipomagnesemia es una afeccin que se caracteriza  por una concentracin demasiado baja de magnesio en la sangre. La hipomagnesemia puede afectar todos los rganos del cuerpo. El  tratamiento puede incluir comer ms alimentos que contengan magnesio, tomar suplementos de magnesio y no beber alcohol. Hgase controlar los niveles de magnesio como se lo haya indicado el mdico. Esta informacin no tiene Theme park manager el consejo del mdico. Asegrese de hacerle al mdico cualquier pregunta que tenga. Document Revised: 09/28/2020 Document Reviewed: 09/28/2020 Elsevier Patient Education  2024 [employer].

## 2022-10-23 NOTE — Progress Notes (Signed)
[facility] OFFICE PROGRESS NOTE   Diagnosis: Colon cancer  INTERVAL HISTORY:   Mr. Nonie Hoyer returns as scheduled.  He completed cycle 3 FOLFIRI/Panitumumab 09/26/2022.  Treatment was held 10/09/2022 due to a rash.  The groin rash is much better.  He notes a rash at the right lower leg.  No nausea or vomiting.  No mouth sores.  No diarrhea.  Recent dysuria.  Objective:  Vital signs in last 24 hours:  Blood pressure (!) 145/72, pulse 72, temperature 98.1 F (36.7 C), temperature source Oral, resp. rate 18, height 5\' 8"  (1.727 m), weight 161 lb 11.2 oz (73.3 kg), SpO2 99 %.    HEENT: No thrush or ulcers. Resp: Lungs clear bilaterally. Cardio: Regular rate and rhythm. GI: Abdomen soft and nontender.  No hepatosplenomegaly.  No mass. Vascular: No leg edema. Skin: Hyperpigmentation bilateral groin with small area of linear ulceration on both sides at the lower groin.  Dry appearing skin rash at the right lower posterior leg. Port-A-Cath without erythema.  Lab Results:  Lab Results  Component Value Date   WBC 9.5 10/23/2022   HGB 13.1 10/23/2022   HCT 40.7 10/23/2022   MCV 91.1 10/23/2022   PLT 180 10/23/2022   NEUTROABS 6.6 10/23/2022    Imaging:  No results found.  Medications: I have reviewed the patient's current medications.  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), transverse colon cancer stage IIB (T4aN0) status post extended right hemicolectomy 05/07/2019, MSS Tumor invasive superficial portion of the muscle ("internal third "), no tumor perforation, no vascular invasion or perineural invasion, negative resection margins, 0/4 lymph nodes Elevated CEA 02/10/2019 CTs 03/05/2019, compared to outside CTs from 11/22/2018-worsened wall thickening in the cecum stable apple core lesion in the mid transverse colon, no evidence of recurrent  tumor at the distal colonic anastomosis, no evidence of metastatic disease Colonoscopy 03/24/2019 20-2 malignant appearing masses noted in the colon, 1 filled the cecum measuring approximately 4 cm across and friable.  The other malignant appearing mass was circumferential creating a stricture with a 1.2 cm lumen located in the transverse segment approximately 4 to 5 cm in length.  There were 12-18 neoplastic appearing polyps in between the 2 obvious malignant masses ranging in size from 3 mm to 2 cm.  5 polyps distal to the transverse colon mass.  2 sigmoid colon polyps.  Cecum mass positive for adenocarcinoma.  Transverse colon mass positive for adenocarcinoma. Foundation 1 transverse colon-MSS, tumor mutation burden 7, KRAS wild-type, BRAF fusion 05/07/2019 laparoscopic extended right hemicolectomy, lysis of adhesions-5 cm invasive moderately differentiated adenocarcinoma involving cecum and proximal ascending colon, carcinoma invades into the pericolonic soft tissue; 7 cm invasive moderately differentiated carcinoma involving the transverse colon, carcinoma invades into the serosal surface (visceral peritoneum); all resection margins negative for carcinoma.  Lymphovascular invasion present.  In the proximal colon metastatic carcinoma involves 3 of 17 lymph nodes with 1 tumor deposit.  In the transverse colon 12 lymph nodes negative for metastatic carcinoma.  3 separate polyps: Tubular adenoma, inflammatory polyp and hyperplastic polyp Cycle 1 capecitabine 06/02/2019 Cycle 2 capecitabine 06/23/2019 Cycle 3 capecitabine 07/14/2019 Cycle 4 capecitabine 08/04/2019 Cycle 5 capecitabine 08/25/2019 Cycle 6 capecitabine 09/15/2019 Cycle 7 capecitabine 10/06/2019 Cycle 8 capecitabine 10/27/2019 Upper endoscopy 07/23/2020-negative Colonoscopy 07/23/2020-normal-appearing anastomoses, polyp removed from the descending colon-tubular adenoma CTs 01/18/2021-right abdominal mesenteric mass, mass in the low anatomic pelvis  consistent with metastases Cycle  1 FOLFOX 02/21/2021 Cycle 2 FOLFOX 03/07/2021, dose reductions made due to mild neutropenia Cycle 3 FOLFOX 03/21/2021 Cycle 4 FOLFOX 04/04/2021 Cycle 5 FOLFOX 04/19/2021 Cycle 6 FOLFOX 05/02/2021 CTs 05/12/2021-decrease size of nodular right mesenteric mass, complete resolution of soft tissue enhancing nodule in the right inguinal canal, resolution of nodular soft tissue mass posterior to the bladder, enlargement of a left upper lobe nodule and new 4 mm left upper lobe nodule-indeterminate Cycle 7 FOLFOX 05/16/2021 Cycle 8 FOLFOX 05/30/2021, oxaliplatin and 5-FU bolus held due to neuropathy and thrombocytopenia Cycle 9 FOLFOX 06/13/2021, oxaliplatin dose reduced Cycle 10 FOLFOX 06/27/2021 CT 07/07/2021-decrease in right mesenteric mass, decreased size of lobulated left upper lobe nodule, resolution of previous "new "left upper lobe nodule, new area of wall thickening at the distal transverse colon Treatment changed to Xeloda 2 weeks on/1 week off beginning 07/18/2021 Cycle 2 Xeloda beginning 08/09/2021 Cycle 3 Xeloda beginning 08/30/2021 Cycle 4 Xeloda beginning 09/21/2021 Cycle 5 Xeloda beginning 10/12/2021 Cycle 6 Xeloda beginning 11/02/2021 CTs 11/14/2021-right lower quadrant spiculated small bowel mesenteric mass measured 2.8 x 3.7 cm, previously 1.0 x 2.3 cm; adjacent mesenteric haziness and nodularity measuring up to 8 mm superiorly.  New 4 mm nodular lesion posterior segment right upper lobe along the major fissure. Treatment break beginning 11/17/2021 CTs 03/21/2022-multiple new liver metastases, enlarging implant in the right ileocolic mesentery, enlarging peritoneal implants in the low pelvis Cycle 1 FOLFIRI/Avastin 03/29/2022 Cycle 2 FOLFIRI/Avastin 04/11/2022 Cycle 3 FOLFIRI/Avastin 04/26/2022 Cycle 4 FOLFIRI/Avastin 05/10/2022 Cycle 5 FOLFIRI/Avastin 05/24/2022 CT abdomen/pelvis 06/02/2022-liver metastases are stable and smaller, smaller peritoneal/mesenteric  nodules Cycle 6 FOLFIRI/Avastin 06/06/2022 Cycle 7 FOLFIRI/Avastin 06/20/2022 Cycle 8 FOLFIRI/Avastin 07/04/2022 Treatment held 07/18/2022 due to neutropenia Cycle 9 FOLFIRI/Avastin 07/24/2022, Udenyca Cycle 10 FOLFIRI/Avastin 08/08/2022, Udenyca CTs 08/18/2022-slight enlargement of several liver lesions, right mesenteric mass and low pelvic soft tissue nodule are stable Cycle 1 FOLFIRI/Panitumumab 08/29/2022 Cycle 2 FOLFIRI/Panitumumab 09/12/2022 Cycle 3 FOLFIRI/panitumumab 09/26/2022 Cycle 4 FOLFIRI/Panitumumab 10/23/2022, Panitumumab dose reduced 2.   Microcytic anemia secondary to #1.    Resolved 3.   Family history of pancreas and colon cancer, negative genetic testing per the [facility] panel, ATM variant of unknown significance 4.   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 Nonie Hoyer appears stable.  He has completed 3 cycles of FOLFIRI/Panitumumab.  Cycle 4 was held last week due to a groin rash.  The rash is better.  He understands the rash is likely related to Panitumumab.  Plan to proceed with treatment today as scheduled with a dose reduction of the Panitumumab.  He agrees with this plan.  We reviewed the labs from today.  Labs are okay for treatment.  Magnesium continues to be low.  He will increase oral magnesium to twice daily.  Plan for IV magnesium today.  The CEA is higher.  Results reviewed with him and his son.  We will obtain a repeat CEA on 11/06/2022 so result will be available for the scheduled appointment on 11/07/2022.  They are in agreement with this plan.  We will see him in follow-up on 11/07/2022 with a CEA the day prior.    Lonna Cobb ANP/GNP-BC   10/23/2022  9:01 AM

## 2022-10-23 NOTE — Progress Notes (Signed)
U/A unremarkable. Called lab to add urine culture.

## 2022-10-23 NOTE — Progress Notes (Signed)
Visit today assisted by spanish interpreter, [place] w/[facility] 

## 2022-10-23 NOTE — Progress Notes (Signed)
John Vance from [place] at chairside for lab/flush and infusion until 10:30 translating for patient.

## 2022-10-23 NOTE — Progress Notes (Signed)
Patient seen by Lonna Cobb NP today  Vitals are within treatment parameters.No intervention for BP 145/72  Labs reviewed by Lonna Cobb NP and are not all within treatment parameters. Mg+ 1.0  Per physician team, patient is ready for treatment. Please note that modifications are being made to the treatment plan including Will receive 4 grams Mg+ today

## 2022-10-24 ENCOUNTER — Telehealth: Payer: Self-pay

## 2022-10-24 ENCOUNTER — Other Ambulatory Visit: Payer: Self-pay

## 2022-10-24 LAB — URINE CULTURE: Culture: NO GROWTH

## 2022-10-24 NOTE — Telephone Encounter (Signed)
-----   Message from Rana Snare, NP sent at 10/24/2022  1:21 PM EDT ----- Please let his son know the urine culture returned negative.

## 2022-10-24 NOTE — Telephone Encounter (Signed)
Spoke with son, message relayed per Harlen [facility], NP

## 2022-10-25 ENCOUNTER — Inpatient Hospital Stay: Payer: Commercial Managed Care - HMO

## 2022-10-25 VITALS — BP 145/74 | HR 73 | Temp 97.6°F | Resp 18

## 2022-10-25 DIAGNOSIS — Z5111 Encounter for antineoplastic chemotherapy: Secondary | ICD-10-CM | POA: Diagnosis not present

## 2022-10-25 DIAGNOSIS — C189 Malignant neoplasm of colon, unspecified: Secondary | ICD-10-CM

## 2022-10-25 MED ORDER — SODIUM CHLORIDE 0.9% FLUSH
10.0000 mL | INTRAVENOUS | Status: DC | PRN
  Administered 2022-10-25: 10 mL

## 2022-10-25 MED ORDER — HEPARIN SOD (PORK) LOCK FLUSH 100 UNIT/ML IV SOLN
500.0000 [IU] | Freq: Once | INTRAVENOUS | Status: AC | PRN
  Administered 2022-10-25: 500 [IU]

## 2022-10-25 NOTE — Patient Instructions (Signed)

## 2022-10-25 NOTE — Progress Notes (Signed)
[employer] present during appointment.

## 2022-10-31 ENCOUNTER — Encounter: Payer: Self-pay | Admitting: Nurse Practitioner

## 2022-10-31 ENCOUNTER — Telehealth: Payer: Self-pay | Admitting: *Deleted

## 2022-10-31 NOTE — Telephone Encounter (Addendum)
[name] is pursuing citizenship and went to immigration center today. When he was fingerprinted it did not provide a clear print.  Needs letter from MD that his chemotherapy treatment could have affected this. Letter dictated by Lonna Cobb, NP.  Emailed to son and he will take to immigration center.

## 2022-11-01 ENCOUNTER — Encounter (HOSPITAL_COMMUNITY): Payer: Self-pay | Admitting: Emergency Medicine

## 2022-11-01 ENCOUNTER — Other Ambulatory Visit: Payer: Self-pay

## 2022-11-01 ENCOUNTER — Telehealth: Payer: Self-pay | Admitting: *Deleted

## 2022-11-01 ENCOUNTER — Emergency Department (HOSPITAL_COMMUNITY)
Admission: EM | Admit: 2022-11-01 | Discharge: 2022-11-01 | Disposition: A | Discharge status: Home / Self Care | Payer: Commercial Managed Care - HMO | Attending: Emergency Medicine | Admitting: Emergency Medicine

## 2022-11-01 DIAGNOSIS — R300 Dysuria: Secondary | ICD-10-CM | POA: Diagnosis present

## 2022-11-01 DIAGNOSIS — R339 Retention of urine, unspecified: Secondary | ICD-10-CM

## 2022-11-01 DIAGNOSIS — N3000 Acute cystitis without hematuria: Secondary | ICD-10-CM | POA: Diagnosis not present

## 2022-11-01 DIAGNOSIS — C189 Malignant neoplasm of colon, unspecified: Secondary | ICD-10-CM | POA: Insufficient documentation

## 2022-11-01 LAB — CBC WITH DIFFERENTIAL/PLATELET
Abs Immature Granulocytes: 0.14 10*3/uL — ABNORMAL HIGH (ref 0.00–0.07)
Basophils Absolute: 0 10*3/uL (ref 0.0–0.1)
Basophils Relative: 0 %
Eosinophils Absolute: 0.3 10*3/uL (ref 0.0–0.5)
Eosinophils Relative: 3 %
HCT: 38 % — ABNORMAL LOW (ref 39.0–52.0)
Hemoglobin: 11.9 g/dL — ABNORMAL LOW (ref 13.0–17.0)
Immature Granulocytes: 1 %
Lymphocytes Relative: 12 %
Lymphs Abs: 1.3 10*3/uL (ref 0.7–4.0)
MCH: 29.5 pg (ref 26.0–34.0)
MCHC: 31.3 g/dL (ref 30.0–36.0)
MCV: 94.3 fL (ref 80.0–100.0)
Monocytes Absolute: 1.2 10*3/uL — ABNORMAL HIGH (ref 0.1–1.0)
Monocytes Relative: 11 %
Neutro Abs: 7.8 10*3/uL — ABNORMAL HIGH (ref 1.7–7.7)
Neutrophils Relative %: 73 %
Platelets: 139 10*3/uL — ABNORMAL LOW (ref 150–400)
RBC: 4.03 MIL/uL — ABNORMAL LOW (ref 4.22–5.81)
RDW: 16.5 % — ABNORMAL HIGH (ref 11.5–15.5)
WBC: 10.8 10*3/uL — ABNORMAL HIGH (ref 4.0–10.5)
nRBC: 0 % (ref 0.0–0.2)

## 2022-11-01 LAB — URINALYSIS, ROUTINE W REFLEX MICROSCOPIC
Bilirubin Urine: NEGATIVE
Glucose, UA: NEGATIVE mg/dL
Hgb urine dipstick: NEGATIVE
Ketones, ur: NEGATIVE mg/dL
Leukocytes,Ua: NEGATIVE
Nitrite: NEGATIVE
Protein, ur: 30 mg/dL — AB
Specific Gravity, Urine: 1.021 (ref 1.005–1.030)
pH: 5 (ref 5.0–8.0)

## 2022-11-01 LAB — BASIC METABOLIC PANEL
Anion gap: 7 (ref 5–15)
BUN: 18 mg/dL (ref 8–23)
CO2: 24 mmol/L (ref 22–32)
Calcium: 8.5 mg/dL — ABNORMAL LOW (ref 8.9–10.3)
Chloride: 106 mmol/L (ref 98–111)
Creatinine, Ser: 0.86 mg/dL (ref 0.61–1.24)
GFR, Estimated: >60 mL/min (ref >60)
Glucose, Bld: 98 mg/dL (ref 70–99)
Potassium: 4.2 mmol/L (ref 3.5–5.1)
Sodium: 137 mmol/L (ref 135–145)

## 2022-11-01 MED ORDER — CEPHALEXIN 500 MG PO CAPS: 500.0000 mg | ORAL_CAPSULE | Freq: Four times a day (QID) | ORAL | 0 refills | Status: DC

## 2022-11-01 NOTE — ED Provider Notes (Signed)
[place] EMERGENCY DEPARTMENT AT [facility] Provider Note   CSN: [id number] Arrival date & time: 11/01/22  0865     History  Chief Complaint  Patient presents with   Dysuria    [facility] John Vance is a 82 y.o. male.   Dysuria Presenting symptoms: dysuria   Patient with history of colon cancer.  On chemotherapy.  Last chemo was 9 days ago.  Now with dysuria.  Has had difficulty urinating.  Urinary burning and feeling as if he may not be emptying.  No fevers or chills.  No flank pain.  No history of urinary retention prior to recently.  Does have colon cancer.  Reportedly cancer markers are going up and will be rechecked soon if increasing may need new CT scan.    Past Medical History:  Diagnosis Date   Anemia    Blood transfusion without reported diagnosis    Colon cancer (HCC)    History of kidney stones     Home Medications Prior to Admission medications   Medication Sig Start Date End Date Taking? Authorizing Provider  cephALEXin (KEFLEX) 500 MG capsule Take 1 capsule (500 mg total) by mouth 4 (four) times daily. 11/01/22  Yes Benjiman Core, MD  Ascorbic Acid (VITAMIN C) 1000 MG tablet Take 1,000 mg by mouth daily.    [provider]  b complex vitamins capsule Take 1 capsule by mouth daily.    [provider]  bismuth subsalicylate (PEPTO BISMOL) 262 MG chewable tablet Chew 262 mg by mouth daily as needed for indigestion or diarrhea or loose stools.    [provider]  clotrimazole-betamethasone (LOTRISONE) cream Apply to affected area 2 times daily prn 10/05/22   Smoot, Sarah A, PA-C  doxycycline (VIBRA-TABS) 100 MG tablet Take 1 tablet (100 mg total) by mouth 2 (two) times daily. 10/09/22   Ladene Artist, MD  esomeprazole (NEXIUM) 20 MG capsule Take 20 mg by mouth daily at 12 noon.    [provider]  lidocaine-prilocaine (EMLA) cream Apply 1 Application topically as needed. 04/11/22   Rana Snare, NP   magic mouthwash SOLN Take 5 mLs by mouth 4 (four) times daily as needed. 07/24/22   [provider]  magnesium oxide (MAG-OX) 400 (240 Mg) MG tablet Take 1 tablet (400 mg total) by mouth daily. 10/09/22   Ladene Artist, MD  menthol-zinc oxide (GOLD BOND) powder Apply topically 2 (two) times daily. 10/05/22   Smoot, Shawn Route, PA-C  pegfilgrastim-cbqv (UDENYCA) 6 MG/0.6ML injection Administer 6 mg SQ on day 3 of each 14 day chemotherapy cycle. 1st dose needed on 09/28/22 09/19/22   Ladene Artist, MD  PREVIDENT 5000 BOOSTER PLUS 1.1 % PSTE Take by mouth daily. 04/08/21   [provider]  prochlorperazine (COMPAZINE) 10 MG tablet Take 1 tablet (10 mg total) by mouth every 6 (six) hours as needed for nausea or vomiting. Patient not taking: Reported on 06/06/2022 04/11/22   Rana Snare, NP      Allergies    Patient has no known allergies.    Review of Systems   Review of Systems  Genitourinary:  Positive for dysuria.    Physical Exam Updated Vital Signs BP 138/60 (BP Location: Right Arm)   Pulse 69   Temp 98.1 F (36.7 C) (Oral)   Resp 17   Ht 5\' 8"  (1.727 m)   Wt 73 kg   SpO2 99%   BMI 24.47 kg/m  Physical Exam Vitals  and nursing note reviewed.  Cardiovascular:     Rate and Rhythm: Regular rhythm.  Abdominal:     Tenderness: There is abdominal tenderness.  Genitourinary:    Comments: Suprapubic tenderness. Skin:    Capillary Refill: Capillary refill takes less than 2 seconds.  Neurological:     Mental Status: He is alert and oriented to person, place, and time.     ED Results / Procedures / Treatments   Labs (all labs ordered are listed, but only abnormal results are displayed) Labs Reviewed  URINALYSIS, ROUTINE W REFLEX MICROSCOPIC - Abnormal; Notable for the following components:      Result Value   APPearance HAZY (*)    Protein, ur 30 (*)    Bacteria, UA RARE (*)    All other components within normal limits  CBC WITH DIFFERENTIAL/PLATELET -  Abnormal; Notable for the following components:   WBC 10.8 (*)    RBC 4.03 (*)    Hemoglobin 11.9 (*)    HCT 38.0 (*)    RDW 16.5 (*)    Platelets 139 (*)    Neutro Abs 7.8 (*)    Monocytes Absolute 1.2 (*)    Abs Immature Granulocytes 0.14 (*)    All other components within normal limits  BASIC METABOLIC PANEL - Abnormal; Notable for the following components:   Calcium 8.5 (*)    All other components within normal limits  URINE CULTURE    EKG None  Radiology No results found.  Procedures Procedures    Medications Ordered in ED Medications - No data to display  ED Course/ Medical Decision Making/ A&P                             Medical Decision Making Amount and/or Complexity of Data Reviewed Labs: ordered.  Risk Prescription drug management.   Patient with near attention/dysuria.  Urinalysis shows likely infection.  Postvoid residual of almost 300.  I think with UTI and retention would benefit from Foley catheter.  Will send culture also.  Blood work reassuring.  Good kidney function.  Potentially could have worsening of his cancer causing obstruction however is being followed by oncology.  Will follow with urology and discharged home.  Does not appear septic at this time.        Final Clinical Impression(s) / ED Diagnoses Final diagnoses:  Acute cystitis without hematuria  Urinary retention    Rx / DC Orders ED Discharge Orders          Ordered    cephALEXin (KEFLEX) 500 MG capsule  4 times daily        11/01/22 1304              Benjiman Core, MD 11/01/22 1306

## 2022-11-01 NOTE — Telephone Encounter (Signed)
Son left VM reporting dysuria and issues with stream initiation. VM retrieved at 0920-in attempt to return call, noted patient was at [facility] emergency room. Will f/u.

## 2022-11-01 NOTE — ED Triage Notes (Signed)
Pt endorses discomfort when urinating since Sunday but worsening each day.  Hx of colon cancer and Last chemo 7/1. No pain at rest. Denies fevers or flank pain.

## 2022-11-02 LAB — URINE CULTURE: Culture: NO GROWTH

## 2022-11-03 ENCOUNTER — Encounter (HOSPITAL_COMMUNITY): Payer: Self-pay

## 2022-11-03 ENCOUNTER — Emergency Department (HOSPITAL_COMMUNITY)
Admission: EM | Admit: 2022-11-03 | Discharge: 2022-11-03 | Disposition: A | Discharge status: Home / Self Care | Payer: Commercial Managed Care - HMO | Attending: Emergency Medicine | Admitting: Emergency Medicine

## 2022-11-03 ENCOUNTER — Other Ambulatory Visit: Payer: Self-pay

## 2022-11-03 DIAGNOSIS — T83091A Other mechanical complication of indwelling urethral catheter, initial encounter: Secondary | ICD-10-CM | POA: Insufficient documentation

## 2022-11-03 DIAGNOSIS — Y732 Prosthetic and other implants, materials and accessory gastroenterology and urology devices associated with adverse incidents: Secondary | ICD-10-CM | POA: Insufficient documentation

## 2022-11-03 DIAGNOSIS — Z85038 Personal history of other malignant neoplasm of large intestine: Secondary | ICD-10-CM | POA: Insufficient documentation

## 2022-11-03 DIAGNOSIS — T839XXA Unspecified complication of genitourinary prosthetic device, implant and graft, initial encounter: Secondary | ICD-10-CM

## 2022-11-03 LAB — URINALYSIS, ROUTINE W REFLEX MICROSCOPIC
Bacteria, UA: NONE SEEN
Bilirubin Urine: NEGATIVE
Glucose, UA: NEGATIVE mg/dL
Ketones, ur: NEGATIVE mg/dL
Nitrite: NEGATIVE
Protein, ur: 100 mg/dL — AB
RBC / HPF: >50 RBC/hpf (ref 0–5)
Specific Gravity, Urine: 1.015 (ref 1.005–1.030)
WBC, UA: >50 WBC/hpf (ref 0–5)
pH: 6 (ref 5.0–8.0)

## 2022-11-03 NOTE — ED Provider Notes (Signed)
[place] EMERGENCY DEPARTMENT AT [facility] Provider Note   CSN: [id number] Arrival date & time: 11/03/22  4098     History  Chief Complaint  Patient presents with   catheter issue    [facility] Nonie Hoyer is a 82 y.o. male history of sigmoid colon cancer, status post right hemicolectomy presented with Foley catheter issues.  Patient was seen 2 days ago and had a Foley catheter placed as he could not urinate.  Patient has been taking his Keflex as prescribed and was not having issues until last night when the son walked into his room and saw that the patient had urinated everywhere on the bed and the urine was leaking out of the catheter.  Since then son states patient does not been able to urinate.  Patient states that he is having lower abdominal pain however this abdominal pain is the same abdominal pain he had 2 days ago.  Son states that they have urology appointment on the 26th of this month.  Son and patient deny fevers, nausea/vomiting, chest pain, shortness of breath, lethargy  Home Medications Prior to Admission medications   Medication Sig Start Date End Date Taking? Authorizing Provider  Ascorbic Acid (VITAMIN C) 1000 MG tablet Take 1,000 mg by mouth daily.    [provider]  b complex vitamins capsule Take 1 capsule by mouth daily.    [provider]  bismuth subsalicylate (PEPTO BISMOL) 262 MG chewable tablet Chew 262 mg by mouth daily as needed for indigestion or diarrhea or loose stools.    [provider]  cephALEXin (KEFLEX) 500 MG capsule Take 1 capsule (500 mg total) by mouth 4 (four) times daily. 11/01/22   Benjiman Core, MD  clotrimazole-betamethasone (LOTRISONE) cream Apply to affected area 2 times daily prn 10/05/22   Smoot, Shawn Route, PA-C  doxycycline (VIBRA-TABS) 100 MG tablet Take 1 tablet (100 mg total) by mouth 2 (two) times daily. 10/09/22   Ladene Artist, MD  esomeprazole (NEXIUM) 20 MG capsule Take 20 mg by  mouth daily at 12 noon.    [provider]  lidocaine-prilocaine (EMLA) cream Apply 1 Application topically as needed. 04/11/22   Rana Snare, NP  magic mouthwash SOLN Take 5 mLs by mouth 4 (four) times daily as needed. 07/24/22   [provider]  magnesium oxide (MAG-OX) 400 (240 Mg) MG tablet Take 1 tablet (400 mg total) by mouth daily. 10/09/22   Ladene Artist, MD  menthol-zinc oxide (GOLD BOND) powder Apply topically 2 (two) times daily. 10/05/22   Smoot, Shawn Route, PA-C  pegfilgrastim-cbqv (UDENYCA) 6 MG/0.6ML injection Administer 6 mg SQ on day 3 of each 14 day chemotherapy cycle. 1st dose needed on 09/28/22 09/19/22   Ladene Artist, MD  PREVIDENT 5000 BOOSTER PLUS 1.1 % PSTE Take by mouth daily. 04/08/21   [provider]  prochlorperazine (COMPAZINE) 10 MG tablet Take 1 tablet (10 mg total) by mouth every 6 (six) hours as needed for nausea or vomiting. Patient not taking: Reported on 06/06/2022 04/11/22   Rana Snare, NP      Allergies    Patient has no known allergies.    Review of Systems   Review of Systems  Physical Exam Updated Vital Signs BP (!) 146/70   Pulse 78   Temp 98.1 F (36.7 C) (Oral)   Resp 16   SpO2 97%  Physical Exam Constitutional:      General: He is not in acute  distress.    Appearance: He is not ill-appearing.     Comments: Pacing around Patient would not let me perform a physical exam as he needs to defecate so physical exam was extremely limited  Genitourinary:    Comments: No signs of skin color changes, testicular swelling, urinary leakage Musculoskeletal:     Comments: Pacing around the room  Skin:    General: Skin is warm and dry.  Neurological:     Mental Status: He is alert and oriented to person, place, and time.  Psychiatric:        Mood and Affect: Mood normal.     ED Results / Procedures / Treatments   Labs (all labs ordered are listed, but only abnormal results are displayed) Labs Reviewed   URINALYSIS, ROUTINE W REFLEX MICROSCOPIC - Abnormal; Notable for the following components:      Result Value   APPearance CLOUDY (*)    Hgb urine dipstick MODERATE (*)    Protein, ur 100 (*)    Leukocytes,Ua MODERATE (*)    All other components within normal limits  BASIC METABOLIC PANEL  CBC WITH DIFFERENTIAL/PLATELET    EKG None  Radiology No results found.  Procedures Procedures    Medications Ordered in ED Medications - No data to display  ED Course/ Medical Decision Making/ A&P                             Medical Decision Making Amount and/or Complexity of Data Reviewed Labs: ordered.   [address] Nonie Hoyer 82 y.o. presented today for Foley catheter problem. Working DDx that I considered at this time includes, but not limited to, clogged Foley catheter, catheter associated UTI, obstruction, sepsis, dehydration.  R/o DDx: catheter associated UTI, obstruction, sepsis, dehydration: These are considered less likely due to history of present illness and physical exam findings  Review of prior external notes: 11/01/2022 ED  Unique Tests and My Interpretation:  UA: Unremarkable  Discussion with Independent Historian:  Son  Discussion of Management of Tests: None  Risk: Low: based on diagnostic testing/clinical impression and treatment plan  Risk Stratification Score: None  Staffed with Particia Nearing, MD  Plan: On exam patient was in no acute distress stable vitals.  Patient would not let me perform a physical exam as he was walking around due to the need to defecate.  Patient stated that he was having lower abdominal pain from the catheter and was not urinating.  Son states that last night he found patient's bed soaked in urine and since then has not urinated.  Son and patient deny endorsing any infectious symptoms at this time and UA from triage is reassuring.  During the initial encounter patient was able to ambulate and I did not note any leakage around his  Foley catheter site or any areas of irritation before patient went to the bathroom.  Patient will be bladder scanned.  Patient stable this time.  Bladder scan shows 372 mL urine.  Catheter will be changed out to see if the catheter is clogged.  If patient is still unable to urinate then basic labs will be drawn and patient be scanned to rule out obstruction.  Foley catheter was changed out and patient was able to urinate 375 mL +.  Patient states that he feels much better now.  At this time the husband patient had a clogged catheter as the nurse stated when she was changing out the catheter she  noted that the holes in the catheter appeared clogged.  Patient is on endorsing any septic symptoms and is not septic here.  Encouraged patient to continue using his Keflex and to follow-up with his urologist.  Patient stable at this time for discharge.  Patient was given return precautions. Patient stable for discharge at this time.  Patient verbalized understanding of plan.         Final Clinical Impression(s) / ED Diagnoses Final diagnoses:  Problem with Foley catheter, initial encounter [facility])    Rx / DC Orders ED Discharge Orders     None         Remi Deter 11/03/22 1478    Jacalyn Lefevre, MD 11/03/22 [phone]

## 2022-11-03 NOTE — ED Triage Notes (Signed)
Pt is leaking urine around his foley catheter site.

## 2022-11-03 NOTE — Discharge Instructions (Addendum)
Please follow-up with your urologist regarding recent symptoms and ER visit.  Today your Foley catheter appeared clogged and we changed out and you are able to urinate afterwards.  Please continue to use your Keflex as prescribed and if symptoms change or worsen please return to ER.

## 2022-11-03 NOTE — ED Notes (Signed)
Patient given leg bag and instructions on how to use. Changed out foley drainage bag to leg bag for patient in the room. Patient tolerated well, had no further questions on use.

## 2022-11-04 ENCOUNTER — Encounter (HOSPITAL_COMMUNITY): Payer: Self-pay

## 2022-11-04 ENCOUNTER — Emergency Department (HOSPITAL_COMMUNITY)
Admission: EM | Admit: 2022-11-04 | Discharge: 2022-11-04 | Disposition: A | Discharge status: Home / Self Care | Payer: Commercial Managed Care - HMO | Attending: Emergency Medicine | Admitting: Emergency Medicine

## 2022-11-04 ENCOUNTER — Other Ambulatory Visit: Payer: Self-pay

## 2022-11-04 DIAGNOSIS — R339 Retention of urine, unspecified: Secondary | ICD-10-CM | POA: Insufficient documentation

## 2022-11-04 DIAGNOSIS — R1030 Lower abdominal pain, unspecified: Secondary | ICD-10-CM | POA: Insufficient documentation

## 2022-11-04 DIAGNOSIS — Z85038 Personal history of other malignant neoplasm of large intestine: Secondary | ICD-10-CM | POA: Diagnosis not present

## 2022-11-04 DIAGNOSIS — T83091A Other mechanical complication of indwelling urethral catheter, initial encounter: Secondary | ICD-10-CM | POA: Diagnosis not present

## 2022-11-04 NOTE — ED Provider Notes (Signed)
[place] EMERGENCY DEPARTMENT AT [facility] Provider Note   CSN: [id number] Arrival date & time: 11/04/22  0345     History  Chief Complaint  Patient presents with   Urinary Retention    [facility] Nonie Hoyer is a 82 y.o. male.  Patient presents to the emergency department complaining of possible urinary retention.  Patient with past medical history significant for sigmoid colon cancer, status post right hemicolectomy.  Patient states that there has been no urine flow through the catheter since 6 PM last night.  He has been seen twice earlier this week for Foley catheter issues with 2 catheter exchanges.  He complains of mild suprapubic pain which increases when there is the urge to urinate.  Patient denies fevers, nausea, vomiting, chest pain, shortness of breath.  Patient does have a scheduled urology appointment for later in July.  HPI     Home Medications Prior to Admission medications   Medication Sig Start Date End Date Taking? Authorizing Provider  Ascorbic Acid (VITAMIN C) 1000 MG tablet Take 1,000 mg by mouth daily.    [provider]  b complex vitamins capsule Take 1 capsule by mouth daily.    [provider]  bismuth subsalicylate (PEPTO BISMOL) 262 MG chewable tablet Chew 262 mg by mouth daily as needed for indigestion or diarrhea or loose stools.    [provider]  cephALEXin (KEFLEX) 500 MG capsule Take 1 capsule (500 mg total) by mouth 4 (four) times daily. 11/01/22   Benjiman Core, MD  clotrimazole-betamethasone (LOTRISONE) cream Apply to affected area 2 times daily prn 10/05/22   Smoot, Shawn Route, PA-C  doxycycline (VIBRA-TABS) 100 MG tablet Take 1 tablet (100 mg total) by mouth 2 (two) times daily. 10/09/22   Ladene Artist, MD  esomeprazole (NEXIUM) 20 MG capsule Take 20 mg by mouth daily at 12 noon.    [provider]  lidocaine-prilocaine (EMLA) cream Apply 1 Application topically as needed. 04/11/22    Rana Snare, NP  magic mouthwash SOLN Take 5 mLs by mouth 4 (four) times daily as needed. 07/24/22   [provider]  magnesium oxide (MAG-OX) 400 (240 Mg) MG tablet Take 1 tablet (400 mg total) by mouth daily. 10/09/22   Ladene Artist, MD  menthol-zinc oxide (GOLD BOND) powder Apply topically 2 (two) times daily. 10/05/22   Smoot, Shawn Route, PA-C  pegfilgrastim-cbqv (UDENYCA) 6 MG/0.6ML injection Administer 6 mg SQ on day 3 of each 14 day chemotherapy cycle. 1st dose needed on 09/28/22 09/19/22   Ladene Artist, MD  PREVIDENT 5000 BOOSTER PLUS 1.1 % PSTE Take by mouth daily. 04/08/21   [provider]  prochlorperazine (COMPAZINE) 10 MG tablet Take 1 tablet (10 mg total) by mouth every 6 (six) hours as needed for nausea or vomiting. Patient not taking: Reported on 06/06/2022 04/11/22   Rana Snare, NP      Allergies    Patient has no known allergies.    Review of Systems   Review of Systems  Physical Exam Updated Vital Signs BP (!) 146/66 (BP Location: Right Arm)   Pulse 94   Temp 97.9 F (36.6 C) (Oral)   Resp 19   Ht 5\' 8"  (1.727 m)   Wt 72.6 kg   SpO2 98%   BMI 24.33 kg/m  Physical Exam Vitals and nursing note reviewed.  HENT:     Head: Normocephalic and atraumatic.  Eyes:     Pupils: Pupils are  equal, round, and reactive to light.  Pulmonary:     Effort: Pulmonary effort is normal. No respiratory distress.  Abdominal:     Palpations: Abdomen is soft.     Tenderness: There is no abdominal tenderness.     Comments: No suprapubic tenderness on exam  Musculoskeletal:        General: No signs of injury.     Cervical back: Normal range of motion.  Skin:    General: Skin is dry.  Neurological:     Mental Status: He is alert.  Psychiatric:        Speech: Speech normal.        Behavior: Behavior normal.     ED Results / Procedures / Treatments   Labs (all labs ordered are listed, but only abnormal results are displayed) Labs Reviewed - No data to  display  EKG None  Radiology No results found.  Procedures Procedures    Medications Ordered in ED Medications - No data to display  ED Course/ Medical Decision Making/ A&P                             Medical Decision Making  Patient presents with a chief complaint of no urination.  Differential includes Foley catheter problem, blood clots, and others  I reviewed notes from oncology and emergency medicine  The patient had 2 urinalysis performed this week along with a urine culture which was negative for growth.  Urine return after flushing catheter today did not grossly appear contaminated.  I see no indication for repeat UA today.  Bladder scan was completed showing 361 mL urine.  Catheter was flushed with successful urine return.  Unclear as to what is causing patient's catheter issues.  No blood clots returned after flush.  Patient is not on blood thinners.  Urine did not appear infected his most recent UA yesterday.  Plan to continue with plan to have patient follow-up with urology.  No indication for admission at this time       Final Clinical Impression(s) / ED Diagnoses Final diagnoses:  Obstruction of Foley catheter, initial encounter [facility])    Rx / DC Orders ED Discharge Orders     None         Pamala Duffel 11/04/22 0458    Nira Conn, MD 11/04/22 [phone]

## 2022-11-04 NOTE — Discharge Instructions (Signed)
You were seen tonight for an obstructed foley catheter which was treated by flushing the catheter. Please keep your scheduled follow up with urology.

## 2022-11-04 NOTE — ED Triage Notes (Signed)
Pt states that he has not had urine flow into his catheter bag since 6 pm yesterday evening.

## 2022-11-05 ENCOUNTER — Encounter (HOSPITAL_COMMUNITY): Payer: Self-pay

## 2022-11-05 ENCOUNTER — Other Ambulatory Visit: Payer: Self-pay | Admitting: Oncology

## 2022-11-05 ENCOUNTER — Emergency Department (HOSPITAL_COMMUNITY)
Admission: EM | Admit: 2022-11-05 | Discharge: 2022-11-05 | Disposition: A | Discharge status: Home / Self Care | Payer: Commercial Managed Care - HMO | Attending: Emergency Medicine | Admitting: Emergency Medicine

## 2022-11-05 DIAGNOSIS — Z85038 Personal history of other malignant neoplasm of large intestine: Secondary | ICD-10-CM | POA: Diagnosis not present

## 2022-11-05 DIAGNOSIS — Y732 Prosthetic and other implants, materials and accessory gastroenterology and urology devices associated with adverse incidents: Secondary | ICD-10-CM | POA: Insufficient documentation

## 2022-11-05 DIAGNOSIS — T83091A Other mechanical complication of indwelling urethral catheter, initial encounter: Secondary | ICD-10-CM | POA: Diagnosis not present

## 2022-11-05 NOTE — ED Provider Notes (Signed)
[place] EMERGENCY DEPARTMENT AT [facility] Provider Note  CSN: [id number] Arrival date & time: 11/05/22 0118  Chief Complaint(s) Clogged Catheter  HPI John Vance John Vance is a 82 y.o. male with a past medical history listed below recently seen for urinary retention requiring John Vance placement.  He has had recurrent obstruction of the John Vance.  2 days ago the John Vance catheter was replaced.  UA without infection at that time.  He was seen last night for the same and unclogged by irrigation.  He returns again tonight for the same.  States that he did stop putting urine out around 7 PM.  He feels suprapubic discomfort.  Other than that he has no other physical complaints.  HPI  Past Medical History Past Medical History:  Diagnosis Date   Anemia    Blood transfusion without reported diagnosis    Colon cancer (HCC)    History of kidney stones    Patient Active Problem List   Diagnosis Date Noted   Goals of care, counseling/discussion 02/09/2021   Heartburn 06/08/2020   Colon cancer (HCC) 05/07/2019   Genetic testing 02/27/2019   Family history of leukemia    Family history of breast cancer    Family history of colon cancer    Family history of cancer    Cancer of sigmoid colon (HCC) 02/10/2019   Home Medication(s) Prior to Admission medications   Medication Sig Start Date End Date Taking? Authorizing Provider  Ascorbic Acid (VITAMIN C) 1000 MG tablet Take 1,000 mg by mouth daily.    [provider]  b complex vitamins capsule Take 1 capsule by mouth daily.    [provider]  bismuth subsalicylate (PEPTO BISMOL) 262 MG chewable tablet Chew 262 mg by mouth daily as needed for indigestion or diarrhea or loose stools.    [provider]  cephALEXin (KEFLEX) 500 MG capsule Take 1 capsule (500 mg total) by mouth 4 (four) times daily. 11/01/22   Benjiman Core, MD  clotrimazole-betamethasone (LOTRISONE) cream Apply to affected area 2 times  daily prn 10/05/22   Smoot, Shawn Route, PA-C  doxycycline (VIBRA-TABS) 100 MG tablet Take 1 tablet (100 mg total) by mouth 2 (two) times daily. 10/09/22   Ladene Artist, MD  esomeprazole (NEXIUM) 20 MG capsule Take 20 mg by mouth daily at 12 noon.    [provider]  lidocaine-prilocaine (EMLA) cream Apply 1 Application topically as needed. 04/11/22   Rana Snare, NP  magic mouthwash SOLN Take 5 mLs by mouth 4 (four) times daily as needed. 07/24/22   [provider]  magnesium oxide (MAG-OX) 400 (240 Mg) MG tablet Take 1 tablet (400 mg total) by mouth daily. 10/09/22   Ladene Artist, MD  menthol-zinc oxide (GOLD BOND) powder Apply topically 2 (two) times daily. 10/05/22   Smoot, Shawn Route, PA-C  pegfilgrastim-cbqv (UDENYCA) 6 MG/0.6ML injection Administer 6 mg SQ on day 3 of each 14 day chemotherapy cycle. 1st dose needed on 09/28/22 09/19/22   Ladene Artist, MD  PREVIDENT 5000 BOOSTER PLUS 1.1 % PSTE Take by mouth daily. 04/08/21   [provider]  prochlorperazine (COMPAZINE) 10 MG tablet Take 1 tablet (10 mg total) by mouth every 6 (six) hours as needed for nausea or vomiting. Patient not taking: Reported on 06/06/2022 04/11/22   Rana Snare, NP  Allergies Patient has no known allergies.  Review of Systems Review of Systems As noted in HPI  Physical Exam Vital Signs  I have reviewed the triage vital signs BP (!) 141/73   Pulse (!) 108   Temp 98.4 F (36.9 C) (Oral)   Resp 17   SpO2 98%   Physical Exam Vitals reviewed.  Constitutional:      General: He is not in acute distress.    Appearance: He is well-developed. He is not diaphoretic.  HENT:     Head: Normocephalic and atraumatic.     Right Ear: External ear normal.     Left Ear: External ear normal.     Nose: Nose normal.     Mouth/Throat:     Mouth: Mucous membranes  are moist.  Eyes:     General: No scleral icterus.    Conjunctiva/sclera: Conjunctivae normal.  Neck:     Trachea: Phonation normal.  Cardiovascular:     Rate and Rhythm: Normal rate and regular rhythm.  Pulmonary:     Effort: Pulmonary effort is normal. No respiratory distress.     Breath sounds: No stridor.  Abdominal:     General: There is distension (suprapubic).     Tenderness: There is no abdominal tenderness.  Genitourinary:    Comments: John Vance in place Musculoskeletal:        General: Normal range of motion.     Cervical back: Normal range of motion.  Neurological:     Mental Status: He is alert and oriented to person, place, and time.  Psychiatric:        Behavior: Behavior normal.     ED Results and Treatments Labs (all labs ordered are listed, but only abnormal results are displayed) Labs Reviewed - No data to display                                                                                                                       EKG  EKG Interpretation Date/Time:    Ventricular Rate:    PR Interval:    QRS Duration:    QT Interval:    QTC Calculation:   R Axis:      Text Interpretation:         Radiology No results found.  Medications Ordered in ED Medications - No data to display Procedures Procedures  (including critical care time) Medical Decision Making / ED Course   Medical Decision Making   John Vance easily flushed and now flowing. No need for replacement. Already had appt with urology coming up.    Final Clinical Impression(s) / ED Diagnoses Final diagnoses:  Obstructed John Vance catheter, initial encounter [facility])   The patient appears reasonably screened and/or stabilized for discharge and I doubt any other medical condition or other John Vance requiring further screening, evaluation, or treatment in the ED at this time. I have discussed the findings, Dx and Tx plan with the patient/family who expressed understanding and agree(s) with the plan.  Discharge instructions discussed  at length. The patient/family was given strict return precautions who verbalized understanding of the instructions. No further questions at time of discharge.  Disposition: Discharge  Condition: Good  ED Discharge Orders     None         Follow Up: Ladene Artist, MD [address] [place] [address] [phone]  Call  as needed    This chart was dictated using voice recognition software.  Despite best efforts to proofread,  errors can occur which can change the documentation meaning.    Nira Conn, MD 11/05/22 [phone]

## 2022-11-05 NOTE — ED Notes (Signed)
Irrigated foley catheter. Began trickling after first flush, flushed a second time and flow increased. Drained ~300cc. Family verbalized understanding of irrigation techniques. Replaced leg bag. Provided with extra supplies. Pt tolerated well.

## 2022-11-05 NOTE — ED Triage Notes (Signed)
Pt. Arrives POV for a clogged catheter. Pt. Visitor states that it stopped working at about 7pm. Pt. Seen several times in the past for the same.

## 2022-11-06 ENCOUNTER — Other Ambulatory Visit: Payer: Self-pay | Admitting: *Deleted

## 2022-11-06 ENCOUNTER — Inpatient Hospital Stay: Payer: Managed Care, Other (non HMO)

## 2022-11-06 ENCOUNTER — Inpatient Hospital Stay: Payer: Commercial Managed Care - HMO

## 2022-11-06 DIAGNOSIS — C189 Malignant neoplasm of colon, unspecified: Secondary | ICD-10-CM

## 2022-11-06 DIAGNOSIS — Z5111 Encounter for antineoplastic chemotherapy: Secondary | ICD-10-CM | POA: Diagnosis not present

## 2022-11-06 DIAGNOSIS — Z95828 Presence of other vascular implants and grafts: Secondary | ICD-10-CM

## 2022-11-06 LAB — CBC WITH DIFFERENTIAL (CANCER CENTER ONLY)
Abs Immature Granulocytes: 0.05 10*3/uL (ref 0.00–0.07)
Basophils Absolute: 0 10*3/uL (ref 0.0–0.1)
Basophils Relative: 0 %
Eosinophils Absolute: 0.3 10*3/uL (ref 0.0–0.5)
Eosinophils Relative: 4 %
HCT: 36.8 % — ABNORMAL LOW (ref 39.0–52.0)
Hemoglobin: 12 g/dL — ABNORMAL LOW (ref 13.0–17.0)
Immature Granulocytes: 1 %
Lymphocytes Relative: 16 %
Lymphs Abs: 1.3 10*3/uL (ref 0.7–4.0)
MCH: 29.9 pg (ref 26.0–34.0)
MCHC: 32.6 g/dL (ref 30.0–36.0)
MCV: 91.5 fL (ref 80.0–100.0)
Monocytes Absolute: 0.7 10*3/uL (ref 0.1–1.0)
Monocytes Relative: 8 %
Neutro Abs: 6.1 10*3/uL (ref 1.7–7.7)
Neutrophils Relative %: 71 %
Platelet Count: 148 10*3/uL — ABNORMAL LOW (ref 150–400)
RBC: 4.02 MIL/uL — ABNORMAL LOW (ref 4.22–5.81)
RDW: 16.1 % — ABNORMAL HIGH (ref 11.5–15.5)
WBC Count: 8.6 10*3/uL (ref 4.0–10.5)
nRBC: 0 % (ref 0.0–0.2)

## 2022-11-06 LAB — CMP (CANCER CENTER ONLY)
ALT: 21 U/L (ref 0–44)
AST: 28 U/L (ref 15–41)
Albumin: 3.6 g/dL (ref 3.5–5.0)
Alkaline Phosphatase: 123 U/L (ref 38–126)
Anion gap: 6 (ref 5–15)
BUN: 13 mg/dL (ref 8–23)
CO2: 24 mmol/L (ref 22–32)
Calcium: 8.9 mg/dL (ref 8.9–10.3)
Chloride: 108 mmol/L (ref 98–111)
Creatinine: 0.71 mg/dL (ref 0.61–1.24)
GFR, Estimated: >60 mL/min (ref >60)
Glucose, Bld: 120 mg/dL — ABNORMAL HIGH (ref 70–99)
Potassium: 4.1 mmol/L (ref 3.5–5.1)
Sodium: 138 mmol/L (ref 135–145)
Total Bilirubin: 0.4 mg/dL (ref 0.3–1.2)
Total Protein: 6 g/dL — ABNORMAL LOW (ref 6.5–8.1)

## 2022-11-06 LAB — MAGNESIUM: Magnesium: 1.4 mg/dL — ABNORMAL LOW (ref 1.7–2.4)

## 2022-11-06 MED ORDER — SODIUM CHLORIDE 0.9% FLUSH
10.0000 mL | INTRAVENOUS | Status: DC | PRN
  Administered 2022-11-06: 10 mL via INTRAVENOUS

## 2022-11-06 MED ORDER — HEPARIN SOD (PORK) LOCK FLUSH 100 UNIT/ML IV SOLN
500.0000 [IU] | Freq: Once | INTRAVENOUS | Status: AC
  Administered 2022-11-06: 500 [IU] via INTRAVENOUS

## 2022-11-06 NOTE — Progress Notes (Signed)
Son, Mr. Nonie Hoyer made aware that the CEA was not drawn today. He agrees to come tomorrow at 0800 for RN to draw from port and run stat.

## 2022-11-07 ENCOUNTER — Inpatient Hospital Stay: Payer: Commercial Managed Care - HMO

## 2022-11-07 ENCOUNTER — Inpatient Hospital Stay: Payer: Managed Care, Other (non HMO) | Admitting: Oncology

## 2022-11-07 ENCOUNTER — Encounter: Payer: Self-pay | Admitting: *Deleted

## 2022-11-07 DIAGNOSIS — Z5111 Encounter for antineoplastic chemotherapy: Secondary | ICD-10-CM | POA: Diagnosis not present

## 2022-11-07 DIAGNOSIS — C189 Malignant neoplasm of colon, unspecified: Secondary | ICD-10-CM | POA: Diagnosis not present

## 2022-11-07 LAB — CEA (ACCESS): CEA (CHCC): 74.51 ng/mL — ABNORMAL HIGH (ref 0.00–5.00)

## 2022-11-07 MED ORDER — FLUOROURACIL CHEMO INJECTION 500 MG/10ML
200.0000 mg/m2 | Freq: Once | INTRAVENOUS | Status: AC
  Administered 2022-11-07: 350 mg via INTRAVENOUS
  Filled 2022-11-07: qty 7

## 2022-11-07 MED ORDER — SODIUM CHLORIDE 0.9 % IV SOLN
200.0000 mg/m2 | Freq: Once | INTRAVENOUS | Status: AC
  Administered 2022-11-07: 374 mg via INTRAVENOUS
  Filled 2022-11-07: qty 18.7

## 2022-11-07 MED ORDER — SODIUM CHLORIDE 0.9 % IV SOLN
10.0000 mg | Freq: Once | INTRAVENOUS | Status: AC
  Administered 2022-11-07: 10 mg via INTRAVENOUS
  Filled 2022-11-07: qty 10

## 2022-11-07 MED ORDER — PALONOSETRON HCL INJECTION 0.25 MG/5ML
0.2500 mg | Freq: Once | INTRAVENOUS | Status: AC
  Administered 2022-11-07: 0.25 mg via INTRAVENOUS
  Filled 2022-11-07: qty 5

## 2022-11-07 MED ORDER — SODIUM CHLORIDE 0.9 % IV SOLN: Freq: Once | INTRAVENOUS | Status: AC

## 2022-11-07 MED ORDER — SODIUM CHLORIDE 0.9 % IV SOLN
180.0000 mg/m2 | Freq: Once | INTRAVENOUS | Status: AC
  Administered 2022-11-07: 340 mg via INTRAVENOUS
  Filled 2022-11-07: qty 15

## 2022-11-07 MED ORDER — SODIUM CHLORIDE 0.9 % IV SOLN
3.0000 mg/kg | Freq: Once | INTRAVENOUS | Status: AC
  Administered 2022-11-07: 200 mg via INTRAVENOUS
  Filled 2022-11-07: qty 10

## 2022-11-07 MED ORDER — SODIUM CHLORIDE 0.9 % IV SOLN
1600.0000 mg/m2 | INTRAVENOUS | Status: DC
  Administered 2022-11-07: 3000 mg via INTRAVENOUS
  Filled 2022-11-07: qty 60

## 2022-11-07 MED ORDER — MAGNESIUM SULFATE 2 GM/50ML IV SOLN
2.0000 g | Freq: Once | INTRAVENOUS | Status: AC
  Administered 2022-11-07: 2 g via INTRAVENOUS
  Filled 2022-11-07: qty 50

## 2022-11-07 MED ORDER — ATROPINE SULFATE 1 MG/ML IV SOLN
0.5000 mg | Freq: Once | INTRAVENOUS | Status: AC | PRN
  Administered 2022-11-07: 0.5 mg via INTRAVENOUS
  Filled 2022-11-07: qty 1

## 2022-11-07 NOTE — Patient Instructions (Signed)
Instrucciones al darle de alta: Discharge Instructions Gracias por elegir al [facility] de Cncer de [place] para brindarle atencin mdica de oncologa y Teacher, English as a foreign language.   Si usted tiene una cita de laboratorio con [employer] de [place], por favor vaya directamente al [employer]uss de Cncer y regstrese en el rea de Engineer, maintenance (IT).   Use ropa cmoda y [place] para tener fcil acceso a las vas del Portacath (acceso venoso de Set designer duracin) o la lnea PICC (catter central colocado por va perifrica).   Nos esforzamos por ofrecerle tiempo de calidad con su proveedor. Es posible que tenga que volver a programar su cita si llega tarde (15 minutos o ms).  El llegar tarde le afecta a usted y a otros pacientes cuyas citas son posteriores a Armed forces operational officer.  Adems, si usted falta a tres o ms citas sin avisar a la oficina, puede ser retirado(a) de la clnica a discrecin del proveedor.      Para las solicitudes de renovacin de recetas, pida a su farmacia que se ponga en contacto con nuestra oficina y deje que transcurran 72 horas para que se complete el proceso de las renovaciones.    Hoy usted recibi los siguientes agentes de quimioterapia e/o inmunoterapia Panitumumab (VECTIBIX), Irinotecan (CAMPTOSAR), Leucovorin & Flourouracil (ADRUCIL).      Para ayudar a prevenir las nuseas y los vmitos despus de su tratamiento, le recomendamos que tome su medicamento para las nuseas segn las indicaciones.  LOS SNTOMAS QUE DEBEN COMUNICARSE INMEDIATAMENTE SE INDICAN A CONTINUACIN: *FIEBRE SUPERIOR A 100.4 F (38 C) O MS *ESCALOFROS O SUDORACIN *NUSEAS Y VMITOS QUE NO SE CONTROLAN CON EL MEDICAMENTO PARA LAS NUSEAS *DIFICULTAD INUSUAL PARA RESPIRAR  *MORETONES O HEMORRAGIAS NO HABITUALES *PROBLEMAS URINARIOS (dolor o ardor al Geographical information systems officer o frecuencia para Geographical information systems officer) *PROBLEMAS INTESTINALES (diarrea inusual, estreimiento, dolor cerca del ano) SENSIBILIDAD EN LA BOCA Y EN LA GARGANTA CON O SIN LA PRESENCIA DE LCERAS  (dolor de garganta, llagas en la boca o dolor de muelas/dientes) ERUPCIN, HINCHAZN O DOLORES INUSUALES FLUJO VAGINAL INUSUAL O PICAZN/RASQUIA    Los puntos marcados con un asterisco ( *) indican una posible emergencia y debe hacer un seguimiento tan pronto como le sea posible o vaya al Departamento de Emergencias si se le presenta algn problema.  Por favor, muestre la [place] DE ADVERTENCIA DE Marc Morgans DE ADVERTENCIA DE Gardiner Fanti al registrarse en [address] de Emergencias y a la enfermera de triaje.  Si tiene preguntas despus de su visita o necesita cancelar o volver a programar su cita, por favor pngase en contacto con [facility]  Dept: [phone]  y siga las instrucciones. Las horas de oficina son de 8:00 a.m. a 4:30 p.m. de lunes a viernes. Por favor, tenga en cuenta que los mensajes de voz que se dejan despus de las 4:00 p.m. posiblemente no se devolvern hasta el siguiente da de [place].  Cerramos los fines de semana y [employer]. En todo momento tiene acceso a una enfermera para preguntas urgentes. Por favor, llame al nmero principal de la clnica Dept: [phone] y siga las instrucciones.   Para cualquier pregunta que no sea de carcter urgente, tambin puede ponerse en contacto con su proveedor [employer] and [employer]. Ahora ofrecemos visitas electrnicas para cualquier persona mayor de 18 aos que solicite atencin mdica en lnea para los sntomas que no sean urgentes. Para ms detalles vaya a [email or web address].   Tambin puede bajar la aplicacin de MyChart! Vaya a la  tienda de aplicaciones, busque "MyChart", abra la aplicacin, seleccione Banks, e ingrese con su nombre de usuario y la contrasea de Clinical cytogeneticist.  Panitumumab Injection Qu es este medicamento? El PANITUMUMAB trata el cncer colorrectal. Acta bloqueando una protena que hace que las clulas cancerosas crezcan y se  multipliquen. Esto ayuda a Neurosurgeon propagacin de las clulas cancerosas. Es un anticuerpo monoclonal. Cleda Clarks medicamento puede ser utilizado para otros usos; si tiene alguna pregunta consulte con su proveedor de atencin mdica o con su farmacutico. MARCAS COMUNES: Vectibix Qu le debo informar a mi profesional de la salud antes de tomar este medicamento? Necesitan saber si usted presenta alguno de los siguientes problemas o situaciones: Enfermedad ocular Niveles bajos de magnesio en la sangre Enfermedad pulmonar Una reaccin alrgica o inusual al panitumumab, a otros medicamentos, alimentos, colorantes o conservantes Si est embarazada o buscando quedar embarazada Si est amamantando a un beb Cmo debo [employer]? Este medicamento se inyecta en una vena. Su equipo de atencin lo [employer] en un hospital o en un entorno clnico. Hable con su equipo de atencin sobre el uso de este medicamento en nios. Puede requerir atencin especial. Sobredosis: Pngase en contacto inmediatamente con un centro toxicolgico o una sala de urgencia si usted cree que haya tomado demasiado medicamento. ATENCIN: [employer] es solo para usted. No comparta este medicamento con nadie. Qu sucede si me olvido de una dosis? Cumpla con las citas para dosis de seguimiento. Es importante no olvidar ninguna dosis. Llame a su equipo de atencin si no puede asistir a una cita. Qu puede interactuar con este medicamento? Bevacizumab Puede ser que esta lista no menciona todas las posibles interacciones. Informe a su profesional de [employer] de [employer] Inc productos a base de hierbas, medicamentos de [place] o suplementos nutritivos que est tomando. Si usted fuma, consume bebidas alcohlicas o si utiliza drogas ilegales, indqueselo tambin a su profesional de [employer]. Algunas sustancias pueden interactuar con su medicamento. A qu debo estar atento al usar [employer]? Se  supervisar su estado de salud atentamente mientras reciba este medicamento. Este medicamento podra hacerle sentir un Risk analyst. Esto no es inusual, ya que la quimioterapia puede afectar tanto a las clulas sanas como a las clulas cancerosas. Si presenta algn efecto secundario, infrmelo. Contine con el tratamiento incluso si se siente enfermo, a menos que su equipo de [employer] lo suspenda. Este medicamento puede aumentar su sensibilidad al sol. Evite exponerse a la Environmental manager recibe [employer] y [place] 2 meses despus de Customer service manager. Si no la Network engineer, utilice ropa protectora y crema de Orthoptist. No utilice lmparas solares, camas solares ni cabinas solares. Consulte con su equipo de atencin si tiene diarrea grave, nuseas y vmitos, o sudoracin intensa. La prdida de demasiado lquido corporal podra hacer que sea peligroso usar [employer]. Este medicamento podra causar reacciones graves en la piel. Pueden presentarse semanas a meses despus de comenzar a Astronomer. Contacte a su equipo de atencin de inmediato si nota que tiene fiebre o sntomas gripales con una erupcin. La erupcin puede ser roja o Clarisa Fling, y luego puede convertirse en ampollas o descamacin de la piel. Tambin podra observar una erupcin roja con hinchazn en la cara, los labios o los ganglios linfticos en el cuello o debajo de los brazos. Hable con su equipo de atencin si podra estar embarazada. Este medicamento puede causar defectos congnitos graves si se [place] durante  el embarazo y por 2 meses despus de la ltima dosis. Se recomienda utilizar un mtodo anticonceptivo mientras est usando este medicamento y por 2 meses despus de la ltima dosis. Su equipo de atencin mdica puede ayudarle a Clinical research associate la opcin que mejor se adapte a sus necesidades. No debe amamantar a un beb mientras [place] este medicamento y por 2 meses despus de la ltima  dosis. Este medicamento podra causar infertilidad. Hable con su equipo de atencin si le preocupa su fertilidad. Qu efectos secundarios puedo tener al [employer] este medicamento? Efectos secundarios que debe informar a su equipo de atencin tan pronto como sea posible: Reacciones alrgicas: erupcin cutnea, comezn/picazn, urticaria, hinchazn de la cara, los labios, la lengua o la garganta Tos seca, falta de aire o problemas para Dietitian, enrojecimiento, irritacin o secrecin de los ojos con visin borrosa o disminuida Reacciones a la infusin: Journalist, newspaper, falta de aire o dificultad para respirar, sensacin de desmayo o aturdimiento Nivel bajo de magnesio: dolor o calambres musculares, debilidad o fatiga inusuales, frecuencia cardiaca rpida o irregular, temblores Nivel bajo de potasio: dolor o calambres musculares, debilidad o fatiga inusuales, frecuencia cardiaca rpida o irregular, estreimiento Enrojecimiento, formacin de ampollas, descamacin o distensin de la piel, incluso dentro de la boca Reacciones cutneas en las zonas expuestas al sol Efectos secundarios que generalmente no requieren atencin mdica (debe informarlos a su equipo de atencin si persisten o si son molestos): Cambio en la forma, grosor o color de las uas Diarrea Piel seca Fatiga Nuseas Vmito Puede ser que esta lista no menciona todos los posibles efectos secundarios. Comunquese a su mdico por asesoramiento mdico [employer]lett[employer]ard. Usted puede informar los efectos secundarios a la FDA por telfono al 1-800-FDA-1088. Dnde debo guardar mi medicina? Este medicamento se administra en hospitales o clnicas. No se guarda en su casa. ATENCIN: Este folleto es un resumen. Puede ser que no cubra toda la posible informacin. Si usted tiene preguntas acerca de esta medicina, consulte con su mdico, su farmacutico o su profesional de Radiographer, therapeutic.  2024 Elsevier/Gold Standard (2022-03-01  00:00:00)  Irinotecan Injection Qu es este medicamento? El IRINOTECN trata algunos tipos de cncer. Acta desacelerando el crecimiento de clulas cancerosas. Este medicamento puede ser utilizado para otros usos; si tiene alguna pregunta consulte con su proveedor de atencin mdica o con su farmacutico. MARCAS COMUNES: Camptosar Qu le debo informar a mi profesional de la salud antes de tomar este medicamento? Necesitan saber si usted presenta alguno de los [facility] o situaciones: Deshidratacin Diarrea Infeccin, especialmente una infeccin viral, tales como varicela, fuegos labiales, herpes Enfermedad heptica Niveles bajos de clulas sanguneas (glbulos blancos, glbulos rojos y plaquetas) Niveles bajos de Customer service manager, tales como calcio, magnesio o potasio en la sangre Radiacin en curso o reciente Una reaccin alrgica o inusual al irinotecn, a otros medicamentos, alimentos, colorantes o conservantes Si usted o su pareja est embarazada o intentando quedar embarazada Si est amamantando a un beb Cmo debo Visual merchandiser medicamento? Este medicamento se inyecta en una vena. Su equipo de atencin lo [employer] en un hospital o en un entorno clnico. Hable con su equipo de atencin sobre el uso de este medicamento en nios. Puede requerir atencin especial. Sobredosis: Pngase en contacto inmediatamente con un centro toxicolgico o una sala de urgencia si usted cree que haya tomado demasiado medicamento. ATENCIN: [employer] American es solo para usted. No comparta este medicamento con nadie. Qu sucede si me olvido de una dosis? Cumpla con las  citas para dosis de seguimiento. Es importante no olvidar ninguna dosis. Llame a su equipo de atencin si no puede asistir a una cita. Qu puede interactuar con este medicamento? No use este medicamento con ninguno de los siguientes productos: Cobicistat Itraconazol Este medicamento tambin podra interactuar con los  siguientes productos: Ciertos antibiticos, tales como claritromicina, rifampicina, rifabutina Ciertos medicamentos antivirales para el VIH o SIDA Ciertos medicamentos para las infecciones micticas, tales como ketoconazol, posaconazol, voriconazol Ciertos medicamentos para convulsiones, tales como [place], fenobarbital, fenitona Gemfibrozil Nefazodona Hierba de [address] ser que esta lista no menciona todas las posibles interacciones. Informe a su profesional de [employer] de [employer] Inc productos a base de hierbas, medicamentos de [place] o suplementos nutritivos que est tomando. Si usted fuma, consume bebidas alcohlicas o si utiliza drogas ilegales, indqueselo tambin a su profesional de [employer]. Algunas sustancias pueden interactuar con su medicamento. A qu debo estar atento al usar [employer]? Se supervisar su estado de salud atentamente mientras reciba este medicamento. Usted podra necesitar realizarse [employer] de sangre mientras est usando [place]. Este medicamento podra hacerle sentir un Risk analyst. Esto no es inusual, ya que la quimioterapia puede afectar tanto a las clulas sanas como a las clulas cancerosas. Si presenta algn efecto secundario, infrmelo. Contine con el tratamiento incluso si se siente enfermo, a menos que su equipo de [employer] lo suspenda. Este medicamento puede causar efectos secundarios graves. Para reducir Nurse, adult, su equipo de atencin puede darle otros medicamentos que deber usar antes de Regulatory affairs officer. Asegrese de seguir las instrucciones de su equipo de atencin. Este medicamento podra afectar su coordinacin, tiempo de reaccin o juicio. No conduzca ni opere maquinaria pesada hasta que sepa cmo le afecta este medicamento. Pngase de pie o levntese lentamente para reducir el riesgo de mareos o [place]. Beber alcohol con [employer] puede aumentar el riesgo de estos efectos secundarios. Este  medicamento puede aumentar su riesgo de contraer una infeccin. Llame para pedir consejo a su equipo de atencin si tiene fiebre, escalofros, dolor de garganta o cualquier otro sntoma de resfriado o gripe. No se trate usted mismo. Trate de no acercarse a personas que estn enfermas. Evite usar medicamentos que contengan aspirina, acetaminofeno, ibuprofeno, naproxeno o ketoprofeno, a menos que as lo indique su equipo de atencin. Estos medicamentos pueden ocultar la fiebre. Este medicamento podra aumentar el riesgo de moretones o sangrado. Llame a su equipo de atencin si observa sangrados inusuales. Proceda con cuidado al cepillar sus dientes, usar hilo dental o Chemical engineer palillos para los dientes, ya que podra contraer una infeccin o Geophysicist/field seismologist con mayor facilidad. Si recibe algn tratamiento dental, informe a su dentista que est [employer]. Informe a su equipo de atencin si usted o su pareja est embarazada o si cree que alguna de las [place] podra [address]. Este medicamento puede causar defectos congnitos graves si se [place] durante el embarazo y por 6 meses despus de la ltima dosis. Deber realizarse una prueba de embarazo y obtener resultado negativo antes de Games developer a Producer, television/film/video. Se recomienda utilizar un mtodo anticonceptivo mientras est usando este medicamento y por 6 meses despus de la ltima dosis. Su equipo de atencin mdica puede ayudarle a Clinical research associate la opcin que mejor se adapte a sus necesidades. No embarace a Careers information officer est usando este medicamento y durante 3 meses despus de la ltima dosis. Use un condn como anticonceptivo [employer]. No debe amamantar a un  beb mientras [place] este medicamento y por 7 das despus de la ltima dosis. Este medicamento podra causar infertilidad. Hable con su equipo de atencin si le preocupa su fertilidad. Qu efectos secundarios puedo tener al [employer] este medicamento? Efectos secundarios que debe  informar a su equipo de atencin tan pronto como sea posible: Reacciones alrgicas: erupcin cutnea, comezn/picazn, urticaria, hinchazn de la cara, los labios, la lengua o la garganta Tos seca, falta de aire o problemas para respirar Aumento de saliva o lgrimas, aumento de la sudoracin, calambres estomacales, diarrea, pupilas pequeas, debilidad o fatiga inusuales, frecuencia cardiaca lenta Infeccin: fiebre, escalofros, tos, dolor de garganta, heridas que no sanan, dolor o problemas para Geographical information systems officer, sensacin general de molestia o malestar [employer] riones: disminucin en la cantidad de orina, hinchazn de los tobillos, las manos o los pies Recuento bajo de glbulos rojos: debilidad o fatiga inusuales, Psychiatrist, Engineer, mining de cabeza, dificultad para respirar Diarrea grave o prolongada Sangrado o moretones inusuales Efectos secundarios que generalmente no requieren atencin mdica (debe informarlos a su equipo de atencin si persisten o si son molestos): Estreimiento Diarrea Cada del cabello Prdida del apetito Nuseas Dolor estomacal Puede ser que esta lista no menciona todos los posibles efectos secundarios. Comunquese a su mdico por asesoramiento mdico [employer]lett[employer]ard. Usted puede informar los efectos secundarios a la FDA por telfono al 1-800-FDA-1088. Dnde debo guardar mi medicina? Este medicamento se administra en hospitales o clnicas. No se guarda en su casa. ATENCIN: Este folleto es un resumen. Puede ser que no cubra toda la posible informacin. Si usted tiene preguntas acerca de esta medicina, consulte con su mdico, su farmacutico o su profesional de Radiographer, therapeutic.  2024 Elsevier/Gold Standard (2022-03-01 00:00:00)  Leucovorin Injection Qu es este medicamento? La LEUCOVORINA previene los efectos secundarios de ciertos medicamentos, Consulting civil engineer. Acta [employer] niveles de folato. Esto ayuda a proteger las clulas sanas del cuerpo. Tambin podra  usarse para tratar la anemia causada por niveles bajos de folato. Tambin se puede Chemical engineer con fluorouracilo, un tipo de quimioterapia, para Tax adviser. Acta [employer] del fluorouracilo en el cuerpo. Este medicamento puede ser utilizado para otros usos; si tiene alguna pregunta consulte con su proveedor de atencin mdica o con su farmacutico. Qu le debo informar a mi profesional de la salud antes de tomar este medicamento? Necesitan saber si usted presenta alguno de los [facility] o situaciones: Anemia por niveles bajos de vitamina B12 en la sangre Una reaccin alrgica o inusual a la leucovorina, al cido flico, a otros medicamentos, alimentos, colorantes o conservantes Si est embarazada o buscando quedar embarazada Si est amamantando a un beb Cmo debo [employer]? Este medicamento se inyecta en una vena o un msculo. Su equipo de atencin lo [employer] en un hospital o en un entorno clnico. Hable con su equipo de atencin sobre el uso de este medicamento en nios. Puede requerir atencin especial. Sobredosis: Pngase en contacto inmediatamente con un centro toxicolgico o una sala de urgencia si usted cree que haya tomado demasiado medicamento. ATENCIN: [employer] es solo para usted. No comparta este medicamento con nadie. Qu sucede si me olvido de una dosis? Cumpla con las citas para dosis de seguimiento. Es importante no olvidar ninguna dosis. Llame a su equipo de atencin si no puede asistir a una cita. Qu puede interactuar con este medicamento? Capecitabina Fluorouracilo Fenobarbital Fenitona Primidona Trimetoprima;sulfametoxasol Puede ser que esta lista no menciona todas las posibles interacciones. Informe a  su profesional de la salud de todos los productos a base de hierbas, medicamentos de venta libre o suplementos nutritivos que est tomando. Si usted fuma, consume bebidas alcohlicas o si utiliza drogas  ilegales, indqueselo tambin a su profesional de [employer]. Algunas sustancias pueden interactuar con su medicamento. A qu debo estar atento al usar [employer]? Se supervisar su estado de salud atentamente mientras reciba este medicamento. Este medicamento podra aumentar los efectos secundarios del 5-fluorouracilo. Informe a su equipo de atencin si tiene diarrea o llagas en la boca que no mejoran o empeoran. Qu efectos secundarios puedo tener al [employer] este medicamento? Efectos secundarios que debe informar a su equipo de atencin tan pronto como sea posible: Reacciones alrgicas: erupcin cutnea, comezn/picazn, urticaria, hinchazn de la cara, los labios, la lengua o la garganta Puede ser que esta lista no menciona todos los posibles efectos secundarios. Comunquese a su mdico por asesoramiento mdico [employer]ard. Usted puede informar los efectos secundarios a la FDA por telfono al 1-800-FDA-1088. Dnde debo guardar mi medicina? Este medicamento se administra en hospitales o clnicas. No se guarda en su casa. ATENCIN: Este folleto es un resumen. Puede ser que no cubra toda la posible informacin. Si usted tiene preguntas acerca de esta medicina, consulte con su mdico, su farmacutico o su profesional de Radiographer, therapeutic.  2024 Elsevier/Gold Standard (2022-03-01 00:00:00)  Fluorouracil Injection Qu es este medicamento? El FLUOROURACILO trata algunos tipos de cncer. Acta desacelerando el crecimiento de clulas cancerosas. Este medicamento puede ser utilizado para otros usos; si tiene alguna pregunta consulte con su proveedor de atencin mdica o con su farmacutico. MARCAS COMUNES: Adrucil Qu le debo informar a mi profesional de la salud antes de tomar este medicamento? Necesitan saber si usted presenta alguno de los siguientes problemas o situaciones: Trastornos sanguneos Deficiencia de dihidropirimidina deshidrogenasa (DPD) Infecciones, tales como  varicela, fuegos labiales, herpes Enfermedad renal Enfermedad heptica Desnutricin Terapia de radiacin en curso o reciente [place] reaccin alrgica o inusual al fluorouracilo, a otros medicamentos, alimentos, colorantes o conservantes Si usted o su pareja est embarazada o intentando quedar embarazada Si est amamantando a un beb Cmo debo Visual merchandiser medicamento? Este medicamento se inyecta en una vena. Su equipo de atencin lo [employer] en un hospital o en un entorno clnico. Hable con su equipo de atencin sobre el uso de este medicamento en nios. Puede requerir atencin especial. Sobredosis: Pngase en contacto inmediatamente con un centro toxicolgico o una sala de urgencia si usted cree que haya tomado demasiado medicamento. ATENCIN: [employer] es solo para usted. No comparta este medicamento con nadie. Qu sucede si me olvido de una dosis? Cumpla con las citas para dosis de seguimiento. Es importante no olvidar ninguna dosis. Llame a su equipo de atencin si no puede asistir a una cita. Qu puede interactuar con este medicamento? No use este medicamento con ninguno de los siguientes productos: Administrator, arts de virus vivos Este medicamento tambin podra Product/process development scientist con los siguientes productos: Medicamentos que tratan o previenen cogulos sanguneos, tales como warfarina, enoxaparina, dalteparina Puede ser que esta lista no menciona todas las posibles interacciones. Informe a su profesional de [employer] de [employer] Inc productos a base de hierbas, medicamentos de [place] o suplementos nutritivos que est tomando. Si usted fuma, consume bebidas alcohlicas o si utiliza drogas ilegales, indqueselo tambin a su profesional de [employer]. Algunas sustancias pueden interactuar con su medicamento. A qu debo estar atento al usar [employer]? Se supervisar su estado de salud atentamente  mientras reciba este medicamento. Este medicamento podra hacerle sentir un Risk analyst.  Esto no es inusual, ya que la quimioterapia puede afectar tanto a las clulas sanas como a las clulas cancerosas. Si presenta algn efecto secundario, infrmelo. Contine con el tratamiento incluso si se siente enfermo, a menos que su equipo de [employer] lo suspenda. En algunos casos, podra recibir [employer] para ayudarle con los efectos secundarios. Siga todas las instrucciones para usarlos. Este medicamento puede aumentar su riesgo de contraer una infeccin. Llame para pedir consejo a su equipo de atencin si tiene fiebre, escalofros, dolor de garganta o cualquier otro sntoma de resfriado o gripe. No se trate usted mismo. Trate de no acercarse a personas que estn enfermas. Este medicamento podra aumentar el riesgo de moretones o sangrado. Llame a su equipo de atencin si observa sangrados inusuales. Proceda con cuidado al cepillar sus dientes, usar hilo dental o Chemical engineer palillos para los dientes, ya que podra contraer una infeccin o Geophysicist/field seismologist con mayor facilidad. Si recibe algn tratamiento dental, informe a su dentista que est [employer]. Evite usar medicamentos que contengan aspirina, acetaminofeno, ibuprofeno, naproxeno o ketoprofeno, a menos que as lo indique su equipo de atencin. Estos medicamentos pueden ocultar la fiebre. No trate la diarrea con productos de [employer]. Contacte a su equipo de atencin si tiene diarrea por ms de [address], o si es grave y [place]. Este medicamento puede aumentar su sensibilidad al sol. Evite la [employer]. Si no la Network engineer, utilice ropa protectora y crema de Orthoptist. No utilice lmparas solares, camas solares ni cabinas solares. Informe a su equipo de atencin si usted o su pareja est buscando un embarazo o si cree que podra estar embarazada. Este medicamento puede causar defectos congnitos graves si se [place] durante el embarazo y por 3 meses despus de la ltima dosis. Se recomienda Chemical engineer un mtodo  anticonceptivo confiable mientras est usando este medicamento y durante 3 meses despus de la ltima dosis. Hable con su equipo de atencin sobre mtodos anticonceptivos eficaces. No embarace a Careers information officer est usando este medicamento y durante 3 meses despus de la ltima dosis. Use un condn durante las relaciones sexuales que mantenga en [place]. No debe amamantar a un beb mientras est [employer]. Este medicamento podra causar infertilidad. Hable con su equipo de atencin si le preocupa su fertilidad. Qu efectos secundarios puedo tener al [employer] este medicamento? Efectos secundarios que debe informar a su equipo de atencin tan pronto como sea posible: Reacciones alrgicas: erupcin cutnea, comezn/picazn, urticaria, hinchazn de la cara, los labios, la lengua o la garganta Ataque cardiaco: Engineer, mining u opresin en el pecho, los hombros, los brazos o la [place], nuseas, falta de [place], piel fra o sudorosa, sensacin de [place] o aturdimiento Insuficiencia cardiaca: falta de aire, hinchazn de los tobillos, los pies o las manos, aumento de peso repentino, debilidad o fatiga inusuales Cambios en el ritmo cardiaco: frecuencia cardiaca rpida o irregular, mareos, sensacin de desmayo o aturdimiento, dolor en el pecho, dificultad para respirar Niveles elevados de amonaco: debilidad o fatiga inusuales, confusin, prdida de apetito, nuseas, vmitos, convulsiones Infeccin: fiebre, escalofros, tos, dolor de garganta, heridas que no sanan, dolor o problemas para Geographical information systems officer, sensacin general de molestia o malestar Recuento bajo de glbulos rojos: debilidad o fatiga inusuales, mareo, dolor de cabeza, dificultad para Dietitian, hormigueo o entumecimiento en las manos o los pies, debilidad muscular, cambios en la visin, confusin o dificultad para hablar,  prdida del equilibrio o la coordinacin, dificultad para caminar, convulsiones Enrojecimiento, hinchazn y Chartered loss adjuster en  la piel de las manos y los pies Diarrea grave o prolongada Sangrado o moretones inusuales Efectos secundarios que generalmente no requieren atencin mdica (debe informarlos a su equipo de atencin si persisten o si son molestos): Piel seca Dolor de cabeza Aumento de Armed forces technical officer, enrojecimiento o hinchazn con llagas dentro de la boca o la garganta Sensibilidad a la luz [place] Puede ser que esta lista no menciona todos los posibles efectos secundarios. Comunquese a su mdico por asesoramiento mdico [employer]. Usted puede informar los efectos secundarios a la FDA por telfono al 1-800-FDA-1088. Dnde debo guardar mi medicina? Este medicamento se administra en hospitales o clnicas. No se guarda en su casa. ATENCIN: Este folleto es un resumen. Puede ser que no cubra toda la posible informacin. Si usted tiene preguntas acerca de esta medicina, consulte con su mdico, su farmacutico o su profesional de Radiographer, therapeutic.  2024 Elsevier/Gold Standard (2022-03-01 00:00:00) The chemotherapy medication bag should finish at 46 hours, 96 hours, or 7 days. For example, if your pump is scheduled for 46 hours and it was put on at 4:00 p.m., it should finish at 2:00 p.m. the day it is scheduled to come off regardless of your appointment time.     Estimated time to finish at 12:30 p.m. on Thursday 11/09/2022.   If the display on your pump reads "Low Volume" and it is beeping, take the batteries out of the pump and come to the cancer center for it to be taken off.   If the pump alarms go off prior to the pump reading "Low Volume" then call [phone] and someone can assist you.  If the plunger comes out and the chemotherapy medication is leaking out, please use your home chemo spill kit to clean up the spill. Do NOT use paper towels or other household products.  If you have problems or questions regarding your pump, please call either [phone] (24 hours a day) or the  cancer center Monday-Friday 8:00 a.m.- 4:30 p.m. at the clinic number and we will assist you. If you are unable to get assistance, then go to the nearest Emergency Department and ask the staff to contact the IV team for assistance.   Magnesium Sulfate Injection Qu es este medicamento? El SULFATO DE MAGNESIO previene y trata los niveles bajos de magnesio en el cuerpo. Tambin podra usarse para prevenir y tratar convulsiones durante Firefighter en personas con trastornos de presin arterial alta, tales como preeclampsia o eclampsia. El magnesio cumple una funcin importante para Pharmacologist la salud de los msculos y del [place]. Este medicamento puede ser utilizado para otros usos; si tiene alguna pregunta consulte con su proveedor de atencin mdica o con su farmacutico. Qu le debo informar a mi profesional de la salud antes de tomar este medicamento? Necesitan saber si usted presenta alguno de los siguientes problemas o situaciones: Enfermedad cardiaca Antecedentes de frecuencia cardaca irregular Enfermedad renal Una reaccin alrgica o inusual al sulfato de magnesio, a otros medicamentos, alimentos, colorantes o conservantes Si est embarazada o buscando quedar embarazada Si est amamantando a un beb Cmo debo Visual merchandiser medicamento? Este medicamento se administra mediante infusin en una vena. Se administra en un hospital o en un entorno clnico. Hable con su equipo de atencin sobre el uso de este medicamento en nios. Aunque este medicamento se puede recetar para ciertas afecciones, existen precauciones que deben tomarse. Sobredosis:  Pngase en contacto inmediatamente con un centro toxicolgico o una sala de urgencia si usted cree que haya tomado demasiado medicamento. ATENCIN: [employer] es solo para usted. No comparta este medicamento con nadie. Qu sucede si me olvido de una dosis? No se aplica en este caso. Qu puede interactuar con este medicamento? Ciertos  medicamentos para la ansiedad o para dormir Ciertos medicamentos para convulsiones, como fenobarbital [employer]ox[employer] Medicamentos para International aid/development worker los msculos antes de una ciruga Medicamentos narcticos para Chief Technology Officer Puede ser que esta lista no menciona todas las posibles interacciones. Informe a su profesional de [employer] de [employer] productos a base de hierbas, medicamentos de [place] o suplementos nutritivos que est tomando. Si usted fuma, consume bebidas alcohlicas o si utiliza drogas ilegales, indqueselo tambin a su profesional de [employer]. Algunas sustancias pueden interactuar con su medicamento. A qu debo estar atento al usar [employer]? Se supervisar su estado de salud atentamente mientras reciba este medicamento. Usted podra necesitar realizarse anlisis de sangre mientras est recibiendo [employer]. Qu efectos secundarios puedo tener al [employer] este medicamento? Efectos secundarios que debe informar a su equipo de atencin tan pronto como sea posible: Reacciones alrgicas: erupcin cutnea, comezn/picazn, urticaria, hinchazn de la cara, los labios, la lengua o la garganta Nivel elevado de magnesio: confusin, somnolencia, enrojecimiento facial, enrojecimiento, sudoracin, debilidad muscular, frecuencia cardiaca rpida o irregular, dificultad respiratoria Presin arterial baja: mareo, sensacin de desmayo o aturdimiento, visin borrosa Efectos secundarios que generalmente no requieren atencin mdica (debe informarlos a su equipo de atencin si persisten o si son molestos): Dolor de cabeza Nuseas Puede ser que esta lista no menciona todos los posibles efectos secundarios. Comunquese a su mdico por asesoramiento mdico [employer]. Usted puede informar los efectos secundarios a la FDA por telfono al 1-800-FDA-1088. Dnde debo guardar mi medicina? Este medicamento se administra en hospitales o clnicas y no es necesario guardarlo en su  domicilio. ATENCIN: Este folleto es un resumen. Puede ser que no cubra toda la posible informacin. Si usted tiene preguntas acerca de esta medicina, consulte con su mdico, su farmacutico o su profesional de Radiographer, therapeutic.  2024 Elsevier/Gold Standard (2022-03-01 00:00:00)

## 2022-11-07 NOTE — Progress Notes (Signed)
John Vance   Diagnosis: Colon cancer  INTERVAL HISTORY:   [name] completed another cycle of FOLFIRI/panitumumab on 10/23/2022.  No nausea/vomiting or mouth sores.  He has increased stool frequency.  The skin rash in the groin has improved. He was seen in the emergency room 11/01/2022 with urinary retention.  A Foley catheter was placed.  He was treated for urinary infection.  He subsequently presented to emergency room on several occasions with leaking from the Foley catheter and a "clogged "catheter.  The catheter is now functioning well.   Objective:  Vital signs in last 24 hours:  Blood pressure (!) 140/70, pulse 76, temperature 98.1 F (36.7 C), temperature source Oral, resp. rate 18, height 5\' 8"  (1.727 m), weight 163 lb 8 oz (74.2 kg), SpO2 100%.    HEENT: Erythema at the tongue and buccal mucosa, healing ulcers at the tongue, no thrush Resp: Lungs clear bilaterally Cardio: Regular rate and rhythm GI: No hepatosplenomegaly Vascular: No leg edema  Skin: Acne type rash over the trunk and head, several areas of paronychia at the fingertips  Portacath/PICC-without erythema  Lab Results:  Lab Results  Component Value Date   WBC 8.6 11/06/2022   HGB 12.0 (L) 11/06/2022   HCT 36.8 (L) 11/06/2022   MCV 91.5 11/06/2022   PLT 148 (L) 11/06/2022   NEUTROABS 6.1 11/06/2022    CMP  Lab Results  Component Value Date   NA 138 11/06/2022   K 4.1 11/06/2022   CL 108 11/06/2022   CO2 24 11/06/2022   GLUCOSE 120 (H) 11/06/2022   BUN 13 11/06/2022   CREATININE 0.71 11/06/2022   CALCIUM 8.9 11/06/2022   PROT 6.0 (L) 11/06/2022   ALBUMIN 3.6 11/06/2022   AST 28 11/06/2022   ALT 21 11/06/2022   ALKPHOS 123 11/06/2022   BILITOT 0.4 11/06/2022   GFRNONAA >60 11/06/2022   GFRAA >60 12/01/2019    Lab Results  Component Value Date   CEA1 12.10 (H) 01/03/2021   CEA 74.51 (H) 11/07/2022     Medications: I have reviewed the  patient's current medications.   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), transverse colon cancer stage IIB (T4aN0) status post extended right hemicolectomy 05/07/2019, MSS Tumor invasive superficial portion of the muscle ("internal third "), no tumor perforation, no vascular invasion or perineural invasion, negative resection margins, 0/4 lymph nodes Elevated CEA 02/10/2019 CTs 03/05/2019, compared to outside CTs from 11/22/2018-worsened wall thickening in the cecum stable apple core lesion in the mid transverse colon, no evidence of recurrent tumor at the distal colonic anastomosis, no evidence of metastatic disease Colonoscopy 03/24/2019 20-2 malignant appearing masses noted in the colon, 1 filled the cecum measuring approximately 4 cm across and friable.  The other malignant appearing mass was circumferential creating a stricture with a 1.2 cm lumen located in the transverse segment approximately 4 to 5 cm in length.  There were 12-18 neoplastic appearing polyps in between the 2 obvious malignant masses ranging in size from 3 mm to 2 cm.  5 polyps distal to the transverse colon mass.  2 sigmoid colon polyps.  Cecum mass positive for adenocarcinoma.  Transverse colon mass positive for adenocarcinoma. Foundation 1 transverse colon-MSS, tumor mutation burden 7, KRAS wild-type, BRAF fusion 05/07/2019 laparoscopic extended right hemicolectomy, lysis of adhesions-5 cm invasive moderately differentiated adenocarcinoma involving cecum and proximal ascending colon, carcinoma invades into the  pericolonic soft tissue; 7 cm invasive moderately differentiated carcinoma involving the transverse colon, carcinoma invades into the serosal surface (visceral peritoneum); all resection margins negative for carcinoma.  Lymphovascular invasion present.  In the proximal colon metastatic carcinoma involves 3 of  17 lymph nodes with 1 tumor deposit.  In the transverse colon 12 lymph nodes negative for metastatic carcinoma.  3 separate polyps: Tubular adenoma, inflammatory polyp and hyperplastic polyp Cycle 1 capecitabine 06/02/2019 Cycle 2 capecitabine 06/23/2019 Cycle 3 capecitabine 07/14/2019 Cycle 4 capecitabine 08/04/2019 Cycle 5 capecitabine 08/25/2019 Cycle 6 capecitabine 09/15/2019 Cycle 7 capecitabine 10/06/2019 Cycle 8 capecitabine 10/27/2019 Upper endoscopy 07/23/2020-negative Colonoscopy 07/23/2020-normal-appearing anastomoses, polyp removed from the descending colon-tubular adenoma CTs 01/18/2021-right abdominal mesenteric mass, mass in the low anatomic pelvis consistent with metastases Cycle 1 FOLFOX 02/21/2021 Cycle 2 FOLFOX 03/07/2021, dose reductions made due to mild neutropenia Cycle 3 FOLFOX 03/21/2021 Cycle 4 FOLFOX 04/04/2021 Cycle 5 FOLFOX 04/19/2021 Cycle 6 FOLFOX 05/02/2021 CTs 05/12/2021-decrease size of nodular right mesenteric mass, complete resolution of soft tissue enhancing nodule in the right inguinal canal, resolution of nodular soft tissue mass posterior to the bladder, enlargement of a left upper lobe nodule and new 4 mm left upper lobe nodule-indeterminate Cycle 7 FOLFOX 05/16/2021 Cycle 8 FOLFOX 05/30/2021, oxaliplatin and 5-FU bolus held due to neuropathy and thrombocytopenia Cycle 9 FOLFOX 06/13/2021, oxaliplatin dose reduced Cycle 10 FOLFOX 06/27/2021 CT 07/07/2021-decrease in right mesenteric mass, decreased size of lobulated left upper lobe nodule, resolution of previous "new "left upper lobe nodule, new area of wall thickening at the distal transverse colon Treatment changed to Xeloda 2 weeks on/1 week off beginning 07/18/2021 Cycle 2 Xeloda beginning 08/09/2021 Cycle 3 Xeloda beginning 08/30/2021 Cycle 4 Xeloda beginning 09/21/2021 Cycle 5 Xeloda beginning 10/12/2021 Cycle 6 Xeloda beginning 11/02/2021 CTs 11/14/2021-right lower quadrant spiculated small bowel mesenteric mass measured 2.8  x 3.7 cm, previously 1.0 x 2.3 cm; adjacent mesenteric haziness and nodularity measuring up to 8 mm superiorly.  New 4 mm nodular lesion posterior segment right upper lobe along the major fissure. Treatment break beginning 11/17/2021 CTs 03/21/2022-multiple new liver metastases, enlarging implant in the right ileocolic mesentery, enlarging peritoneal implants in the low pelvis Cycle 1 FOLFIRI/Avastin 03/29/2022 Cycle 2 FOLFIRI/Avastin 04/11/2022 Cycle 3 FOLFIRI/Avastin 04/26/2022 Cycle 4 FOLFIRI/Avastin 05/10/2022 Cycle 5 FOLFIRI/Avastin 05/24/2022 CT abdomen/pelvis 06/02/2022-liver metastases are stable and smaller, smaller peritoneal/mesenteric nodules Cycle 6 FOLFIRI/Avastin 06/06/2022 Cycle 7 FOLFIRI/Avastin 06/20/2022 Cycle 8 FOLFIRI/Avastin 07/04/2022 Treatment held 07/18/2022 due to neutropenia Cycle 9 FOLFIRI/Avastin 07/24/2022, Udenyca Cycle 10 FOLFIRI/Avastin 08/08/2022, Udenyca CTs 08/18/2022-slight enlargement of several liver lesions, right mesenteric mass and low pelvic soft tissue nodule are stable Cycle 1 FOLFIRI/Panitumumab 08/29/2022 Cycle 2 FOLFIRI/Panitumumab 09/12/2022 Cycle 3 FOLFIRI/panitumumab 09/26/2022 Cycle 4 FOLFIRI/Panitumumab 10/23/2022, Panitumumab dose reduced Cycle 5 FOLFIRI/panitumumab 11/07/2022 2.   Microcytic anemia secondary to #1.    Resolved 3.   Family history of pancreas and colon cancer, negative genetic testing per the [facility] panel, ATM variant of unknown significance 4.   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  Urinary retention 17 2024-Foley catheter placed      Disposition: [name] has metastatic colon cancer.  Has completed 4 cycles of FOLFIRI/panitumumab.  The CEA is slightly higher today.  He has tolerated the treatment well.  The plan is to complete 1 additional cycle of FOLFIRI/panitumumab prior to a  restaging CT evaluation.  A Foley catheter  remains in place.  He has urinary retention, likely secondary to prostatic hypertrophy.  He has been referred to urology.  He has hypomagnesemia secondary to panitumumab.  He will receive intravenous magnesium supplementation today.  Thornton Papas, MD  11/07/2022  9:17 AM

## 2022-11-07 NOTE — Progress Notes (Signed)
Informed patient that increase in loose stools could be related to the increase in magnesium oxide. Suggested that he take Imodium prn to slow this down

## 2022-11-07 NOTE — Progress Notes (Signed)
Patient seen by Dr. Truett Perna today  Vitals are within treatment parameters.  Labs reviewed by Dr. Truett Perna and are not all within treatment parameters. Mg+ low at 1.4--may proceed  Per physician team, patient is ready for treatment. Please note that modifications are being made to the treatment plan including Please administer Mg+ 2 grams today.

## 2022-11-08 ENCOUNTER — Other Ambulatory Visit: Payer: Self-pay | Admitting: *Deleted

## 2022-11-08 DIAGNOSIS — C189 Malignant neoplasm of colon, unspecified: Secondary | ICD-10-CM

## 2022-11-09 ENCOUNTER — Inpatient Hospital Stay: Payer: Commercial Managed Care - HMO

## 2022-11-09 VITALS — BP 133/64 | HR 94 | Temp 97.7°F | Resp 18

## 2022-11-09 DIAGNOSIS — C189 Malignant neoplasm of colon, unspecified: Secondary | ICD-10-CM

## 2022-11-09 DIAGNOSIS — Z5111 Encounter for antineoplastic chemotherapy: Secondary | ICD-10-CM | POA: Diagnosis not present

## 2022-11-09 MED ORDER — SODIUM CHLORIDE 0.9% FLUSH
10.0000 mL | INTRAVENOUS | Status: DC | PRN
  Administered 2022-11-09: 10 mL

## 2022-11-09 MED ORDER — HEPARIN SOD (PORK) LOCK FLUSH 100 UNIT/ML IV SOLN
500.0000 [IU] | Freq: Once | INTRAVENOUS | Status: AC | PRN
  Administered 2022-11-09: 500 [IU]

## 2022-11-09 NOTE — Progress Notes (Signed)
Marta from [facility] at chairside interpreting for patient. Udenyca injection given in right upper arm.

## 2022-11-09 NOTE — Patient Instructions (Signed)

## 2022-11-15 ENCOUNTER — Ambulatory Visit: Payer: Commercial Managed Care - HMO | Admitting: Urology

## 2022-11-15 ENCOUNTER — Encounter: Payer: Self-pay | Admitting: Urology

## 2022-11-15 VITALS — BP 173/100 | HR 105 | Ht 68.0 in | Wt 163.0 lb

## 2022-11-15 DIAGNOSIS — N138 Other obstructive and reflux uropathy: Secondary | ICD-10-CM

## 2022-11-15 DIAGNOSIS — R339 Retention of urine, unspecified: Secondary | ICD-10-CM | POA: Diagnosis not present

## 2022-11-15 DIAGNOSIS — N401 Enlarged prostate with lower urinary tract symptoms: Secondary | ICD-10-CM | POA: Diagnosis not present

## 2022-11-15 LAB — BLADDER SCAN AMB NON-IMAGING

## 2022-11-15 MED ORDER — CIPROFLOXACIN HCL 500 MG PO TABS
500.0000 mg | ORAL_TABLET | Freq: Once | ORAL | Status: AC
Start: 2022-11-15 — End: 2022-11-15
  Administered 2022-11-15: 500 mg via ORAL

## 2022-11-15 MED ORDER — SILODOSIN 8 MG PO CAPS: 8.0000 mg | ORAL_CAPSULE | Freq: Every day | ORAL | 11 refills | Status: DC

## 2022-11-15 NOTE — Progress Notes (Signed)
Fill and Pull Catheter Removal  Patient is present today for a catheter removal.  Patient was cleaned and prepped in a sterile fashion of sterile water/ saline was instilled into the bladder when the patient felt the urge to urinate, 9mL of water was then drained from the balloon.  A 16FR foley cath was removed from the bladder no complications were noted .  Patient was then given some time to void on their own.  Patient can void  on their own after some time.  Patient tolerated well.  Performed by: Jimmye Norman., CMA

## 2022-11-15 NOTE — Addendum Note (Signed)
Addended by: Lizbeth Bark on: 11/15/2022 04:17 PM   Modules accepted: Orders

## 2022-11-15 NOTE — Progress Notes (Signed)
Pt returned to clinic this afternoon unable to urinate. PVR=  Simple Catheter Placement  Due to urinary retention patient is present today for a foley cath placement.  Patient was cleaned and prepped in a sterile fashion with betadine and 2% lidocaine jelly was instilled into the urethra. A 16 FR foley catheter was inserted, urine return was noted  , urine was dark yellowq in color.  The balloon was filled with 10cc of sterile water.  A leg bag was attached for drainage. Patient was also given a night bag to take home and was given instruction on how to change from one bag to another.  Patient was given instruction on proper catheter care.  Patient tolerated well, no complications were noted   Performed by: Ferdinand Revoir A., CMA  Additional notes/ Follow up: RTC in one week for repeat voiding trial.

## 2022-11-15 NOTE — Progress Notes (Signed)
Assessment: 1. Urinary retention   2. BPH with obstruction/lower urinary tract symptoms     Plan: I personally reviewed the patient's chart including provider notes, imaging and lab results. Cipro x 1 following foley removal Begin silodosin 8 mg daily.  Rx sent. Return to office in 7-10 days for bladder scan  Chief Complaint:  Chief Complaint  Patient presents with   Urinary Retention    History of Present Illness:  John Vance John Vance is a 82 y.o. male who is seen in consultation from John Artist, MD for evaluation of urinary retention. He has a history of colon cancer and is status post a right hemicolectomy.  He is currently undergoing chemotherapy. He presented to the emergency room on 11/01/2022 with dysuria and difficulty voiding.  Postvoid residual was 300 mL.  A Foley catheter was placed with return of 600 ml.  He was started on Keflex although his urine culture eventually showed no growth.  He has returned to the emergency room on several occasions for problems with drainage from the Foley catheter.  He has had the Foley catheter exchanged twice.  CT abdomen and pelvis with contrast from 4/24 showed multiple liver metastases, right Hemi abdominal mesenteric mass, small soft tissue nodule in the lower pelvis stable in size, bilateral renal cyst, slight bladder wall thickening with trabeculation, enlarged prostate with mass effect along the base of the bladder.  His Foley catheter has been draining well.  He has not required any irrigation of the catheter for approximately 10 days.  No gross hematuria. No prior history of urinary retention.  He has not been on any medication for BPH symptoms.  Past Medical History:  Past Medical History:  Diagnosis Date   Anemia    Blood transfusion without reported diagnosis    Colon cancer (HCC)    History of kidney stones     Past Surgical History:  Past Surgical History:  Procedure Laterality Date   APPENDECTOMY      COLON SURGERY     in [place], Aug 2020   CYST REMOVAL HAND Left    shoulder   LAPAROSCOPIC RIGHT HEMI COLECTOMY Right 05/07/2019   Procedure: LAPAROSCOPIC RIGHT HEMI COLECTOMY;  Surgeon: Andria Meuse, MD;  Location: [facility];  Service: General;  Laterality: Right;   PORTACATH PLACEMENT N/A 02/07/2021   Procedure: PORT-A-CATH PLACEMENT RIGHT IJ WITH ULTRASOUND GUIDANCE AND X-RAY;  Surgeon: Andria Meuse, MD;  Location: [facility];  Service: General;  Laterality: N/A;   TONSILLECTOMY      Allergies:  No Known Allergies  Family History:  Family History  Problem Relation Age of Onset   Breast cancer Mother 67       spread to pancreas   Pancreatic cancer Mother    Colon cancer Paternal Uncle 53   Leukemia Sister 81       acute - died 2 months after diagnosis   Cancer Niece 63       unknown cancer of the nose   Stomach cancer Neg Hx    Esophageal cancer Neg Hx    Rectal cancer Neg Hx     Social History:  Social History   Tobacco Use   Smoking status: Never    Passive exposure: Current   Smokeless tobacco: Never  Vaping Use   Vaping status: Never Used  Substance Use Topics   Alcohol use: Yes    Alcohol/week: 3.0 - 4.0 standard drinks of alcohol    Types: 3 - 4  Shots of liquor per week    Comment: occ   Drug use: Never    Review of symptoms:  Constitutional:  Negative for unexplained weight loss, night sweats, fever, chills ENT:  Negative for nose bleeds, sinus pain, painful swallowing CV:  Negative for chest pain, shortness of breath, exercise intolerance, palpitations, loss of consciousness Resp:  Negative for cough, wheezing, shortness of breath GI:  Negative for nausea, vomiting, diarrhea, bloody stools GU:  Positives noted in HPI; otherwise negative for gross hematuria, dysuria, urinary incontinence Neuro:  Negative for seizures, poor balance, limb weakness, slurred speech Psych:  Negative for lack of energy, depression, anxiety Endocrine:  Negative for  polydipsia, polyuria, symptoms of hypoglycemia (dizziness, hunger, sweating) Hematologic:  Negative for anemia, purpura, petechia, prolonged or excessive bleeding, use of anticoagulants  Allergic:  Negative for difficulty breathing or choking as a result of exposure to anything; no shellfish allergy; no allergic response (rash/itch) to materials, foods  Physical exam: BP (!) 173/100   Pulse (!) 105   Ht 5\' 8"  (1.727 m)   Wt 163 lb (73.9 kg)   BMI 24.78 kg/m  GENERAL APPEARANCE:  Well appearing, well developed, well nourished, NAD HEENT: Atraumatic, Normocephalic, oropharynx clear. NECK: Supple without lymphadenopathy or thyromegaly. LUNGS: Clear to auscultation bilaterally. HEART: Regular Rate and Rhythm without murmurs, gallops, or rubs. ABDOMEN: Soft, non-tender, No Masses. EXTREMITIES: Moves all extremities well.  Without clubbing, cyanosis, or edema. NEUROLOGIC:  Alert and oriented x 3, normal gait, CN II-XII grossly intact.  MENTAL STATUS:  Appropriate. BACK:  Non-tender to palpation.  No CVAT SKIN:  Warm, dry and intact.   GU:  foley in place draining yellow urine  Results: None  Procedure:  VOIDING TRIAL  A voiding trial was performed in the office today.   Volume of sterile water instilled: 240 mL Foley catheter removed intact. Volume voided by patient: 180 mL Instructed to return to office if has not voided by 4 PM

## 2022-11-19 ENCOUNTER — Other Ambulatory Visit: Payer: Self-pay | Admitting: Oncology

## 2022-11-19 DIAGNOSIS — C189 Malignant neoplasm of colon, unspecified: Secondary | ICD-10-CM

## 2022-11-20 ENCOUNTER — Encounter: Payer: Self-pay | Admitting: Oncology

## 2022-11-20 ENCOUNTER — Ambulatory Visit (HOSPITAL_BASED_OUTPATIENT_CLINIC_OR_DEPARTMENT_OTHER)
Admission: RE | Admit: 2022-11-20 | Discharge: 2022-11-20 | Disposition: A | Discharge status: Home / Self Care | Payer: Commercial Managed Care - HMO | Source: Ambulatory Visit | Attending: Oncology | Admitting: Oncology

## 2022-11-20 ENCOUNTER — Inpatient Hospital Stay: Payer: Commercial Managed Care - HMO

## 2022-11-20 DIAGNOSIS — C189 Malignant neoplasm of colon, unspecified: Secondary | ICD-10-CM | POA: Insufficient documentation

## 2022-11-20 DIAGNOSIS — Z5111 Encounter for antineoplastic chemotherapy: Secondary | ICD-10-CM | POA: Diagnosis not present

## 2022-11-20 MED ORDER — IOHEXOL 300 MG/ML  SOLN
100.0000 mL | Freq: Once | INTRAMUSCULAR | Status: AC | PRN
  Administered 2022-11-20: 85 mL via INTRAVENOUS

## 2022-11-20 NOTE — Progress Notes (Signed)
Lily from [facility] at chairside interpreting for patient.

## 2022-11-21 ENCOUNTER — Inpatient Hospital Stay: Payer: Commercial Managed Care - HMO

## 2022-11-21 ENCOUNTER — Other Ambulatory Visit: Payer: Self-pay

## 2022-11-21 ENCOUNTER — Encounter: Payer: Self-pay | Admitting: Nurse Practitioner

## 2022-11-21 ENCOUNTER — Encounter: Payer: Self-pay | Admitting: Oncology

## 2022-11-21 ENCOUNTER — Inpatient Hospital Stay: Payer: Managed Care, Other (non HMO)

## 2022-11-21 ENCOUNTER — Inpatient Hospital Stay: Payer: Commercial Managed Care - HMO | Admitting: Nurse Practitioner

## 2022-11-21 VITALS — BP 130/58 | HR 82 | Temp 98.1°F | Resp 18 | Ht 68.0 in | Wt 160.0 lb

## 2022-11-21 DIAGNOSIS — K1230 Oral mucositis (ulcerative), unspecified: Secondary | ICD-10-CM

## 2022-11-21 DIAGNOSIS — C787 Secondary malignant neoplasm of liver and intrahepatic bile duct: Secondary | ICD-10-CM | POA: Diagnosis not present

## 2022-11-21 DIAGNOSIS — C187 Malignant neoplasm of sigmoid colon: Secondary | ICD-10-CM | POA: Diagnosis not present

## 2022-11-21 DIAGNOSIS — R339 Retention of urine, unspecified: Secondary | ICD-10-CM

## 2022-11-21 DIAGNOSIS — G620 Drug-induced polyneuropathy: Secondary | ICD-10-CM | POA: Diagnosis not present

## 2022-11-21 DIAGNOSIS — Z800 Family history of malignant neoplasm of digestive organs: Secondary | ICD-10-CM

## 2022-11-21 DIAGNOSIS — C189 Malignant neoplasm of colon, unspecified: Secondary | ICD-10-CM

## 2022-11-21 DIAGNOSIS — Z5111 Encounter for antineoplastic chemotherapy: Secondary | ICD-10-CM | POA: Diagnosis not present

## 2022-11-21 LAB — CMP (CANCER CENTER ONLY)
ALT: 20 U/L (ref 0–44)
AST: 28 U/L (ref 15–41)
Albumin: 3.6 g/dL (ref 3.5–5.0)
Alkaline Phosphatase: 142 U/L — ABNORMAL HIGH (ref 38–126)
Anion gap: 7 (ref 5–15)
BUN: 11 mg/dL (ref 8–23)
CO2: 22 mmol/L (ref 22–32)
Calcium: 8.9 mg/dL (ref 8.9–10.3)
Chloride: 111 mmol/L (ref 98–111)
Creatinine: 0.82 mg/dL (ref 0.61–1.24)
GFR, Estimated: >60 mL/min (ref >60)
Glucose, Bld: 122 mg/dL — ABNORMAL HIGH (ref 70–99)
Potassium: 4.2 mmol/L (ref 3.5–5.1)
Sodium: 140 mmol/L (ref 135–145)
Total Bilirubin: 0.4 mg/dL (ref 0.3–1.2)
Total Protein: 6 g/dL — ABNORMAL LOW (ref 6.5–8.1)

## 2022-11-21 LAB — CBC WITH DIFFERENTIAL (CANCER CENTER ONLY)
Abs Immature Granulocytes: 0.1 10*3/uL — ABNORMAL HIGH (ref 0.00–0.07)
Basophils Absolute: 0.1 10*3/uL (ref 0.0–0.1)
Basophils Relative: 1 %
Eosinophils Absolute: 0.3 10*3/uL (ref 0.0–0.5)
Eosinophils Relative: 3 %
HCT: 38.4 % — ABNORMAL LOW (ref 39.0–52.0)
Hemoglobin: 12.2 g/dL — ABNORMAL LOW (ref 13.0–17.0)
Immature Granulocytes: 1 %
Lymphocytes Relative: 12 %
Lymphs Abs: 1.1 10*3/uL (ref 0.7–4.0)
MCH: 29 pg (ref 26.0–34.0)
MCHC: 31.8 g/dL (ref 30.0–36.0)
MCV: 91.4 fL (ref 80.0–100.0)
Monocytes Absolute: 1 10*3/uL (ref 0.1–1.0)
Monocytes Relative: 10 %
Neutro Abs: 6.8 10*3/uL (ref 1.7–7.7)
Neutrophils Relative %: 73 %
Platelet Count: 254 10*3/uL (ref 150–400)
RBC: 4.2 MIL/uL — ABNORMAL LOW (ref 4.22–5.81)
RDW: 16.7 % — ABNORMAL HIGH (ref 11.5–15.5)
WBC Count: 9.3 10*3/uL (ref 4.0–10.5)
nRBC: 0 % (ref 0.0–0.2)

## 2022-11-21 LAB — MAGNESIUM: Magnesium: 1.4 mg/dL — ABNORMAL LOW (ref 1.7–2.4)

## 2022-11-21 MED ORDER — SODIUM CHLORIDE 0.9 % IV SOLN: INTRAVENOUS | Status: DC

## 2022-11-21 MED ORDER — MAGNESIUM SULFATE 2 GM/50ML IV SOLN: 2.0000 g | Freq: Once | INTRAVENOUS | Status: DC

## 2022-11-21 MED ORDER — MAGNESIUM SULFATE 2 GM/50ML IV SOLN
2.0000 g | Freq: Once | INTRAVENOUS | Status: AC
  Administered 2022-11-21: 2 g via INTRAVENOUS
  Filled 2022-11-21: qty 50

## 2022-11-21 MED ORDER — HEPARIN SOD (PORK) LOCK FLUSH 100 UNIT/ML IV SOLN
500.0000 [IU] | Freq: Once | INTRAVENOUS | Status: AC
  Administered 2022-11-21: 500 [IU] via INTRAVENOUS

## 2022-11-21 MED ORDER — SODIUM CHLORIDE 0.9% FLUSH
10.0000 mL | Freq: Once | INTRAVENOUS | Status: AC
  Administered 2022-11-21: 10 mL via INTRAVENOUS

## 2022-11-21 NOTE — Progress Notes (Signed)
Alondra from [facility] at chairside interpreting for patient.

## 2022-11-21 NOTE — Progress Notes (Signed)
[facility] OFFICE PROGRESS NOTE   Diagnosis: Colon cancer  INTERVAL HISTORY:   [name] returns as scheduled.  He completed cycle 5 FOLFIRI/Panitumumab 11/07/2022.  He denies/vomiting.  He developed mouth sores.  Diarrhea/loose stools for the past several days.  He felt weak for multiple days after treatment.  Rash is worse.  He has persistent mild cold sensitivity.  No neuropathy symptoms in the absence of cold exposure.  Foley catheter was removed recently.  He was unable to urinate.  The catheter was replaced.  He is now taking Rapaflo.  He has follow-up with urology tomorrow.  Objective:  Vital signs in last 24 hours:  Blood pressure (!) 130/58, pulse 82, temperature 98.1 F (36.7 C), resp. rate 18, height 5\' 8"  (1.727 m), weight 160 lb (72.6 kg), SpO2 100%.    HEENT: Scattered patches of erythema within the mouth. Resp: Lungs clear bilaterally. Cardio: Regular rate and rhythm. GI: No hepatosplenomegaly. Vascular: No leg edema. Skin: Acne type rash scattered over the face, chest and extremities.  Palms without erythema. Portacath without erythema.  Lab Results:  Lab Results  Component Value Date   WBC 9.3 11/21/2022   HGB 12.2 (L) 11/21/2022   HCT 38.4 (L) 11/21/2022   MCV 91.4 11/21/2022   PLT 254 11/21/2022   NEUTROABS 6.8 11/21/2022    Imaging:  CT ABDOMEN PELVIS W CONTRAST  Result Date: 11/21/2022 CLINICAL DATA:  Colon cancer.  Restaging.  * Tracking Code: BO * EXAM: CT ABDOMEN AND PELVIS WITH CONTRAST TECHNIQUE: Multidetector CT imaging of the abdomen and pelvis was performed using the standard protocol following bolus administration of intravenous contrast. RADIATION DOSE REDUCTION: This exam was performed according to the departmental dose-optimization program which includes automated exposure control, adjustment of the mA and/or kV according to patient size and/or use of iterative reconstruction technique. CONTRAST:  85mL OMNIPAQUE IOHEXOL 300  MG/ML  SOLN COMPARISON:  08/18/2022 FINDINGS: Lower chest: No acute abnormality. Hepatobiliary: Multifocal liver metastases are again noted. These are increased in multiplicity and size compared with the previous exam. Index lesions include: Right dome of liver metastasis measures 5.5 x 4.8 cm, image 10/2. Formally 2.9 x 2.3 cm. Lateral segment left hepatic lobe lesion anterior to the IVC measures 3.8 x 3.9 cm, image 15/2. Formally 2.4 x 2.2 cm. Index lesion within segment 4 B measures 2.7 x 2.3 cm, image 27/2. Formally 1.9 x 1.7 cm. Index lesion within segment 5/6 measures 3.4 x 3.3 cm, image 33/2. Formally 1.2 x 1.1 cm. Partially decompressed gallbladder with several small stones. No gallbladder wall thickening or inflammation. No bile duct dilatation. Pancreas: Unremarkable. No pancreatic ductal dilatation or surrounding inflammatory changes. Spleen: Normal in size without focal abnormality. Adrenals/Urinary Tract: Normal adrenal glands. Multiple bilateral kidney cysts are noted. The largest arises off the posterior cortex of the upper pole of left kidney measuring 9.8 cm. No follow-up imaging recommended. No nephrolithiasis. Thick walled urinary bladder is identified which appears partially decompressed around a Foley catheter. Small calcification identified at the right UVJ measures 1-2 mm. There is also a small calcification within the left posterior bladder wall near the left UVJ measuring 5 mm, image 70/2. Adjacent stone measures 3 mm, image 72/2. Stomach/Bowel: Stomach appears within normal limits. No pathologic dilatation of the large or small bowel loops. Short segment of circumferential wall thickening at the junction between the sigmoid colon and rectum is noted which measures 2 cm in thickness, similar to previous exam. Vascular/Lymphatic: Aortic atherosclerosis. Patent upper  abdominal vascularity. Right juxta-caval lymph node posterior to the uncinate process of pancreas is new from the previous exam  measuring 1.4 cm, image 39/2. Nodal mass within the right ileocolic mesentery measures 5.9 x 4.2 cm, image 47/2. Formally 5.1 x 4.0 cm. Reproductive: Prostate gland enlargement with mass effect upon the base of bladder. Other: No ascites. Index subserosal nodule along the anterior aspect of the proximal rectum measures 3.2 x 2.2 cm, image 66/2. Formally 2.6 x 1.5 cm. Musculoskeletal: No acute or significant osseous findings. IMPRESSION: 1. Interval progression of disease with increased size and multiplicity of liver metastases. 2. New enlarged right juxta-caval lymph node. 3. Slight increase in size of nodal mass within the right ileocolic mesentery. 4. Slight increase in size of subserosal nodule along the anterior aspect of the proximal rectum. 5. Short segment of circumferential wall thickening at the junction between the sigmoid colon and rectum, similar to previous exam. 6. Thick walled urinary bladder which appears partially decompressed around a Foley catheter. 7. Small calcifications at the right UVJ and left posterior bladder wall near the left UVJ. 8. Prostate gland enlargement with mass effect upon the base of bladder. 9. Cholelithiasis without evidence of acute cholecystitis. 10. Aortic Atherosclerosis (ICD10-I70.0). Electronically Signed   By: Signa Kell M.D.   On: 11/21/2022 08:09    Medications: I have reviewed the patient's current medications.  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), transverse colon cancer stage IIB (T4aN0) status post extended right hemicolectomy 05/07/2019, MSS Tumor invasive superficial portion of the muscle ("internal third "), no tumor perforation, no vascular invasion or perineural invasion, negative resection margins, 0/4 lymph nodes Elevated CEA 02/10/2019 CTs 03/05/2019, compared to outside CTs from 11/22/2018-worsened wall thickening in the  cecum stable apple core lesion in the mid transverse colon, no evidence of recurrent tumor at the distal colonic anastomosis, no evidence of metastatic disease Colonoscopy 03/24/2019 20-2 malignant appearing masses noted in the colon, 1 filled the cecum measuring approximately 4 cm across and friable.  The other malignant appearing mass was circumferential creating a stricture with a 1.2 cm lumen located in the transverse segment approximately 4 to 5 cm in length.  There were 12-18 neoplastic appearing polyps in between the 2 obvious malignant masses ranging in size from 3 mm to 2 cm.  5 polyps distal to the transverse colon mass.  2 sigmoid colon polyps.  Cecum mass positive for adenocarcinoma.  Transverse colon mass positive for adenocarcinoma. Foundation 1 transverse colon-MSS, tumor mutation burden 7, KRAS wild-type, BRAF fusion 05/07/2019 laparoscopic extended right hemicolectomy, lysis of adhesions-5 cm invasive moderately differentiated adenocarcinoma involving cecum and proximal ascending colon, carcinoma invades into the pericolonic soft tissue; 7 cm invasive moderately differentiated carcinoma involving the transverse colon, carcinoma invades into the serosal surface (visceral peritoneum); all resection margins negative for carcinoma.  Lymphovascular invasion present.  In the proximal colon metastatic carcinoma involves 3 of 17 lymph nodes with 1 tumor deposit.  In the transverse colon 12 lymph nodes negative for metastatic carcinoma.  3 separate polyps: Tubular adenoma, inflammatory polyp and hyperplastic polyp Cycle 1 capecitabine 06/02/2019 Cycle 2 capecitabine 06/23/2019 Cycle 3 capecitabine 07/14/2019 Cycle 4 capecitabine 08/04/2019 Cycle 5 capecitabine 08/25/2019 Cycle 6 capecitabine 09/15/2019 Cycle 7 capecitabine 10/06/2019 Cycle 8 capecitabine 10/27/2019 Upper endoscopy 07/23/2020-negative Colonoscopy 07/23/2020-normal-appearing anastomoses, polyp removed from the descending colon-tubular adenoma CTs  01/18/2021-right abdominal mesenteric mass, mass in the low anatomic pelvis consistent  with metastases Cycle 1 FOLFOX 02/21/2021 Cycle 2 FOLFOX 03/07/2021, dose reductions made due to mild neutropenia Cycle 3 FOLFOX 03/21/2021 Cycle 4 FOLFOX 04/04/2021 Cycle 5 FOLFOX 04/19/2021 Cycle 6 FOLFOX 05/02/2021 CTs 05/12/2021-decrease size of nodular right mesenteric mass, complete resolution of soft tissue enhancing nodule in the right inguinal canal, resolution of nodular soft tissue mass posterior to the bladder, enlargement of a left upper lobe nodule and new 4 mm left upper lobe nodule-indeterminate Cycle 7 FOLFOX 05/16/2021 Cycle 8 FOLFOX 05/30/2021, oxaliplatin and 5-FU bolus held due to neuropathy and thrombocytopenia Cycle 9 FOLFOX 06/13/2021, oxaliplatin dose reduced Cycle 10 FOLFOX 06/27/2021 CT 07/07/2021-decrease in right mesenteric mass, decreased size of lobulated left upper lobe nodule, resolution of previous "new "left upper lobe nodule, new area of wall thickening at the distal transverse colon Treatment changed to Xeloda 2 weeks on/1 week off beginning 07/18/2021 Cycle 2 Xeloda beginning 08/09/2021 Cycle 3 Xeloda beginning 08/30/2021 Cycle 4 Xeloda beginning 09/21/2021 Cycle 5 Xeloda beginning 10/12/2021 Cycle 6 Xeloda beginning 11/02/2021 CTs 11/14/2021-right lower quadrant spiculated small bowel mesenteric mass measured 2.8 x 3.7 cm, previously 1.0 x 2.3 cm; adjacent mesenteric haziness and nodularity measuring up to 8 mm superiorly.  New 4 mm nodular lesion posterior segment right upper lobe along the major fissure. Treatment break beginning 11/17/2021 CTs 03/21/2022-multiple new liver metastases, enlarging implant in the right ileocolic mesentery, enlarging peritoneal implants in the low pelvis Cycle 1 FOLFIRI/Avastin 03/29/2022 Cycle 2 FOLFIRI/Avastin 04/11/2022 Cycle 3 FOLFIRI/Avastin 04/26/2022 Cycle 4 FOLFIRI/Avastin 05/10/2022 Cycle 5 FOLFIRI/Avastin 05/24/2022 CT abdomen/pelvis 06/02/2022-liver  metastases are stable and smaller, smaller peritoneal/mesenteric nodules Cycle 6 FOLFIRI/Avastin 06/06/2022 Cycle 7 FOLFIRI/Avastin 06/20/2022 Cycle 8 FOLFIRI/Avastin 07/04/2022 Treatment held 07/18/2022 due to neutropenia Cycle 9 FOLFIRI/Avastin 07/24/2022, Udenyca Cycle 10 FOLFIRI/Avastin 08/08/2022, Udenyca CTs 08/18/2022-slight enlargement of several liver lesions, right mesenteric mass and low pelvic soft tissue nodule are stable Cycle 1 FOLFIRI/Panitumumab 08/29/2022 Cycle 2 FOLFIRI/Panitumumab 09/12/2022 Cycle 3 FOLFIRI/panitumumab 09/26/2022 Cycle 4 FOLFIRI/Panitumumab 10/23/2022, Panitumumab dose reduced Cycle 5 FOLFIRI/panitumumab 11/07/2022 CT 11/20/2022-interval progression with increased size and number of liver metastases; new enlarged right juxta caval lymph node; slight increase in size of nodal mass within the right ileocolic mesentery; slight increase in size of subserosal nodule along the anterior aspect of the proximal rectum 2.   Microcytic anemia secondary to #1.    Resolved 3.   Family history of pancreas and colon cancer, negative genetic testing per the [employer] panel, ATM variant of unknown significance 4.   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  Urinary retention 17 2024-Foley catheter placed  Disposition: [name] has metastatic colon cancer.  He has completed 5 cycles of FOLFIRI/Panitumumab.  Recent restaging CTs show evidence of progression.  Results/images reviewed with him and his son at today's visit.  They understand the recommendation to discontinue the current treatment with FOLFIRI/Panitumumab.  We discussed retreatment with FOLFOX.  They understand the neuropathy symptoms could worsen and there is an increased chance of an allergic type reaction.  He agrees to proceed.  He will return for cycle 1 FOLFOX in 1 week.  He has  mucositis likely related to 5-fluorouracil.  5-FU will be dose reduced with treatment next week.  The skin rash could be related to 5-FU, Panitumumab, both.  Plan to continue supportive care.  He will receive IV fluids plus IV magnesium today.  We will see him in  follow-up prior to cycle 1 FOLFOX next week.  He will contact the office in the interim with any problems.  Patient seen with Dr. Truett Perna.    Lonna Cobb ANP/GNP-BC   11/21/2022  8:49 AM  This was a shared visit with Lonna Cobb.  We reviewed the restaging CT findings and images with [name].  The CEA is higher and there is radiologic evidence of disease progression.  The plan is to discontinue FOLFIRI/panitumumab.  The skin rash is likely in large part related to panitumumab, but the mucositis may be secondary to 5-FU.  We discussed systemic treatment options.  I recommend repeat treatment with FOLFOX.  We reviewed potential toxicities associated with the FOLFOX regimen including chance of progressive neuropathy and an allergic reaction.  He agrees to proceed.  A chemotherapy plan was entered today.  We will follow the CEA and his clinical status while on treatment with FOLFOX.  He will be scheduled for restaging CT evaluation after 3-4 cycles.  He will continue follow-up with urology for management of urinary retention.  This is not clearly related to the metastatic colon cancer.  I was present for greater than 50% of today's visit.  I performed medical decision making.  Mancel Bale, MD

## 2022-11-21 NOTE — Progress Notes (Signed)
Alondra from [facility] at chairside interpreting for patient until 10:35.

## 2022-11-21 NOTE — Patient Instructions (Signed)
Rehydration, Older Adult  Rehydration is the replacement of fluids, salts, and minerals in the body (electrolytes) that are lost during dehydration. Dehydration is when there is not enough water or other fluids in the body. This happens when you lose more fluids than you take in. People who are age 84 or older have a higher risk of dehydration than younger adults. This is because in older age, the body: Is less able to maintain the right amount of water. Does not respond to temperature changes as well. Does not get a sense of thirst as easily or quickly. Other causes include: Not drinking enough fluids. This can occur when you are ill, when you forget to drink, or when you are doing activities that require a lot of energy, especially in hot weather. Conditions that cause loss of water or other fluids. These include diarrhea, vomiting, sweating, or urinating a lot. Other illnesses, such as fever or infection. Certain medicines, such as those that remove excess fluid from the body (diuretics). Symptoms of mild or moderate dehydration may include thirst, dry lips and mouth, and dizziness. Symptoms of severe dehydration may include increased heart rate, confusion, fainting, and not urinating. In severe cases, you may need to get fluids through an IV at the hospital. For mild or moderate cases, you can usually rehydrate at home by drinking certain fluids as told by your health care provider. What are the risks? Rehydration is usually safe. Taking in too much fluid (overhydration) can be a problem but is rare. Overhydration can cause an imbalance of electrolytes in the body, kidney failure, fluid in the lungs, or a decrease in salt (sodium) levels in the body. Supplies needed: You will need an oral rehydration solution (ORS) if your health care provider tells you to use one. This is a drink to treat dehydration. It can be found in pharmacies and retail stores. How to rehydrate Fluids Follow  instructions from your health care provider about what to drink. The kind of fluid and the amount you should drink depend on your condition. In general, you should choose drinks that you prefer. If told by your health care provider, drink an ORS. Make an ORS by following instructions on the package. Start by drinking small amounts, about  cup (120 mL) every 5-10 minutes. Slowly increase how much you drink until you have taken in the amount recommended by your health care provider. Drink enough clear fluids to keep your urine pale yellow. If you were told to drink an ORS, finish it first, then start slowly drinking other clear fluids. Drink fluids such as: Water. This includes sparkling and flavored water. Drinking only water can lead to having too little sodium in your body (hyponatremia). Follow the advice of your health care provider. Water from ice chips you suck on. Fruit juice with water added to it(diluted). Sports drinks. Hot or cold herbal teas. Broth-based soups. Coffee. Milk or milk products. Food Follow instructions from your health care provider about what to eat while you rehydrate. Your health care provider may recommend that you slowly begin eating regular foods in small amounts. Eat foods that contain a healthy balance of electrolytes, such as bananas, oranges, potatoes, tomatoes, and spinach. Avoid foods that are greasy or contain a lot of sugar. In some cases, you may get nutrition through a feeding tube that is passed through your nose and into your stomach (nasogastric tube, or NG tube). This may be done if you have uncontrolled vomiting or diarrhea. Drinks to avoid  Certain drinks may make dehydration worse. While you rehydrate, avoid drinking alcohol. How to tell if you are recovering from dehydration You may be getting better if: You are urinating more often than before you started rehydrating. Your urine is pale yellow. Your energy level improves. You vomit less  often. You have diarrhea less often. Your appetite improves or returns to normal. You feel less dizzy or light-headed. Your skin tone and color start to look more normal. Follow these instructions at home: Take over-the-counter and prescription medicines only as told by your health care provider. Do not take sodium tablets. Doing this can lead to having too much sodium in your body (hypernatremia). Contact a health care provider if: You continue to have symptoms of mild or moderate dehydration, such as: Thirst. Dry lips. Slightly dry mouth. Dizziness. Dark urine or less urine than usual. Muscle cramps. You continue to vomit or have diarrhea. Get help right away if: You have symptoms of dehydration that get worse. You have a fever. You have a severe headache. You have been vomiting and have problems, such as: Your vomiting gets worse. Your vomit includes blood or green matter (bile). You cannot eat or drink without vomiting. You have problems with urination or bowel movements, such as: Diarrhea that gets worse. Blood in your stool (feces). This may cause stool to look black and tarry. Not urinating, or urinating only a small amount of very dark urine, within 6-8 hours. You have trouble breathing. You have symptoms that get worse with treatment. These symptoms may be an emergency. Get help right away. Call 911. Do not wait to see if the symptoms will go away. Do not drive yourself to the hospital. This information is not intended to replace advice given to you by your health care provider. Make sure you discuss any questions you have with your health care provider. Document Revised: 08/24/2021 Document Reviewed: 08/22/2021 Elsevier Patient Education  2024 [employer]  Hypomagnesemia Hypomagnesemia is a condition in which the level of magnesium in the blood is too low. Magnesium is a mineral that is found in many foods. It is used in many different processes in the body.  Hypomagnesemia can affect every organ in the body. In severe cases, it can cause life-threatening problems. What are the causes? This condition may be caused by: Not getting enough magnesium in your diet or not having enough healthy foods to eat (malnutrition). Problems with magnesium absorption in the intestines. Dehydration. Excessive use of alcohol. Vomiting. Severe or long-term (chronic) diarrhea. Some medicines, including medicines that make you urinate more often (diuretics). Certain diseases, such as kidney disease, diabetes, celiac disease, and overactive thyroid. What are the signs or symptoms? Symptoms of this condition include: Loss of appetite, nausea, and vomiting. Involuntary shaking or trembling of a body part (tremor). Muscle weakness or tingling in the arms and legs. Sudden tightening of muscles (muscle spasms). Confusion. Psychiatric issues, such as: Depression and irritability. Psychosis. A feeling of fluttering of the heart (palpitations). Seizures. These symptoms are more severe if magnesium levels drop suddenly. How is this diagnosed? This condition may be diagnosed based on: Your symptoms and medical history. A physical exam. Blood and urine tests. How is this treated? Treatment depends on the cause and the severity of the condition. It may be treated by: Taking a magnesium supplement. This can be taken in pill form. If the condition is severe, magnesium is usually given through an IV. Making changes to your diet. You may be directed to  eat foods that have a lot of magnesium, such as green leafy vegetables, peas, beans, and nuts. Not drinking alcohol. If you are struggling not to drink, ask your health care provider for help. Follow these instructions at home: Eating and drinking     Make sure that your diet includes foods with magnesium. Foods that have a lot of magnesium in them include: Green leafy vegetables, such as spinach and broccoli. Beans and  peas. Nuts and seeds, such as almonds and sunflower seeds. Whole grains, such as whole grain bread and fortified cereals. Drink fluids that contain salts and minerals (electrolytes), such as sports drinks, when you are active. Do not drink alcohol. General instructions Take over-the-counter and prescription medicines only as told by your health care provider. Take magnesium supplements as directed if your health care provider tells you to take them. Have your magnesium levels monitored as told by your health care provider. Keep all follow-up visits. This is important. Contact a health care provider if: You get worse instead of better. Your symptoms return. Get help right away if: You develop severe muscle weakness. You have trouble breathing. You feel that your heart is racing. These symptoms may represent a serious problem that is an emergency. Do not wait to see if the symptoms will go away. Get medical help right away. Call your local emergency services (911 in the [place] Do not drive yourself to the hospital. Summary Hypomagnesemia is a condition in which the level of magnesium in the blood is too low. Hypomagnesemia can affect every organ in the body. Treatment may include eating more foods that contain magnesium, taking magnesium supplements, and not drinking alcohol. Have your magnesium levels monitored as told by your health care provider. This information is not intended to replace advice given to you by your health care provider. Make sure you discuss any questions you have with your health care provider. Document Revised: 09/07/2020 Document Reviewed: 09/07/2020 Elsevier Patient Education  2024 [employer].

## 2022-11-21 NOTE — Progress Notes (Signed)
Patient seen by Lonna Cobb NP today  Vitals are within treatment parameters.  Labs reviewed by Lonna Cobb NP and are not all within treatment parameters. Mag 1.4. Patient will receive 2 g of IV mag  Per physician team, patient will not be receiving treatment today.  Patient will be receiving IVF and 2g of Iv mag.

## 2022-11-21 NOTE — Progress Notes (Signed)
DISCONTINUE ON PATHWAY REGIMEN - Colorectal     A cycle is every 14 days:     Bevacizumab-xxxx      Irinotecan      Leucovorin      Fluorouracil      Fluorouracil   **Always confirm dose/schedule in your pharmacy ordering system**  REASON: Disease Progression PRIOR TREATMENT: [id number]: FOLFIRI + Bevacizumab q14 Days TREATMENT RESPONSE: Progressive Disease (PD)  START ON PATHWAY REGIMEN - Colorectal     A cycle is every 14 days:     Oxaliplatin      Leucovorin      Fluorouracil      Fluorouracil   **Always confirm dose/schedule in your pharmacy ordering system**  Patient Characteristics: Distant Metastases, Nonsurgical Candidate, KRAS/NRAS Wild-Type (BRAF V600 Wild-Type/Unknown), Standard Cytotoxic Therapy, Third Line Standard Cytotoxic Therapy, Prior Anti-EGFR Therapy Tumor Location: Colon Therapeutic Status: Distant Metastases Microsatellite/Mismatch Repair Status: MSS/pMMR BRAF Mutation Status: Wild-Type (no mutation) KRAS/NRAS Mutation Status: Wild-Type (no mutation) Preferred Therapy Approach: Standard Cytotoxic Therapy Standard Cytotoxic Line of Therapy: Third Careers adviser Cytotoxic Therapy Intent of Therapy: Non-Curative / Palliative Intent, Discussed with Patient

## 2022-11-22 ENCOUNTER — Ambulatory Visit: Payer: Commercial Managed Care - HMO | Admitting: Urology

## 2022-11-22 ENCOUNTER — Other Ambulatory Visit: Payer: Self-pay

## 2022-11-22 ENCOUNTER — Encounter: Payer: Self-pay | Admitting: Urology

## 2022-11-22 ENCOUNTER — Other Ambulatory Visit: Payer: Self-pay | Admitting: Nurse Practitioner

## 2022-11-22 VITALS — BP 125/88 | HR 91 | Ht 68.0 in | Wt 163.0 lb

## 2022-11-22 DIAGNOSIS — R339 Retention of urine, unspecified: Secondary | ICD-10-CM | POA: Diagnosis not present

## 2022-11-22 DIAGNOSIS — N401 Enlarged prostate with lower urinary tract symptoms: Secondary | ICD-10-CM | POA: Diagnosis not present

## 2022-11-22 DIAGNOSIS — N138 Other obstructive and reflux uropathy: Secondary | ICD-10-CM | POA: Diagnosis not present

## 2022-11-22 DIAGNOSIS — C187 Malignant neoplasm of sigmoid colon: Secondary | ICD-10-CM

## 2022-11-22 MED ORDER — CIPROFLOXACIN HCL 500 MG PO TABS
500.0000 mg | ORAL_TABLET | Freq: Once | ORAL | Status: AC
Start: 2022-11-22 — End: 2022-11-22
  Administered 2022-11-22: 500 mg via ORAL

## 2022-11-22 MED ORDER — TRAMADOL HCL 50 MG PO TABS
50.0000 mg | ORAL_TABLET | Freq: Three times a day (TID) | ORAL | 0 refills | Status: DC | PRN
Start: 2022-11-22 — End: 2022-12-26

## 2022-11-22 NOTE — Progress Notes (Signed)
Assessment: 1. Urinary retention   2. BPH with obstruction/lower urinary tract symptoms     Plan: Foley removed today after successful voiding trial Cipro x 1 following foley removal Continue silodosin 8 mg daily.  Return to office in 7-10 days for bladder scan  Chief Complaint:  Chief Complaint  Patient presents with   Urinary Retention    History of Present Illness:  John Vance is a 82 y.o. male who is seen for continued evaluation of urinary retention. He has a history of colon cancer and is status post a right hemicolectomy.  He is currently undergoing chemotherapy. He presented to the emergency room on 11/01/2022 with dysuria and difficulty voiding.  Postvoid residual was 300 mL.  A Foley catheter was placed with return of 600 ml.  He was started on Keflex although his urine culture eventually showed no growth.  He has returned to the emergency room on several occasions for problems with drainage from the Foley catheter.  He has had the Foley catheter exchanged twice.  CT abdomen and pelvis with contrast from 4/24 showed multiple liver metastases, right Hemi abdominal mesenteric mass, small soft tissue nodule in the lower pelvis stable in size, bilateral renal cyst, slight bladder wall thickening with trabeculation, enlarged prostate with mass effect along the base of the bladder.  At his visit on 11/15/22, his Foley catheter was been draining well.  He had not required any irrigation of the catheter for approximately 10 days.  No gross hematuria. No prior history of urinary retention.  He had not been on any medication for BPH symptoms. His Foley was removed the office and the patient was able to void almost to completion.  He was started on silodosin 8 mg daily. He returned later that day unable to void.  A Foley catheter was reinserted with return of 280 mL.  Portions of the above documentation were copied from a prior visit for review purposes only.   Past  Medical History:  Past Medical History:  Diagnosis Date   Anemia    Blood transfusion without reported diagnosis    Colon cancer (HCC)    History of kidney stones     Past Surgical History:  Past Surgical History:  Procedure Laterality Date   APPENDECTOMY     COLON SURGERY     in [place], Aug 2020   CYST REMOVAL HAND Left    shoulder   LAPAROSCOPIC RIGHT HEMI COLECTOMY Right 05/07/2019   Procedure: LAPAROSCOPIC RIGHT HEMI COLECTOMY;  Surgeon: Andria Meuse, MD;  Location: [facility];  Service: General;  Laterality: Right;   PORTACATH PLACEMENT N/A 02/07/2021   Procedure: PORT-A-CATH PLACEMENT RIGHT IJ WITH ULTRASOUND GUIDANCE AND X-RAY;  Surgeon: Andria Meuse, MD;  Location: [facility];  Service: General;  Laterality: N/A;   TONSILLECTOMY      Allergies:  No Known Allergies  Family History:  Family History  Problem Relation Age of Onset   Breast cancer Mother 88       spread to pancreas   Pancreatic cancer Mother    Colon cancer Paternal Uncle 21   Leukemia Sister 49       acute - died 2 months after diagnosis   Cancer Niece 6       unknown cancer of the nose   Stomach cancer Neg Hx    Esophageal cancer Neg Hx    Rectal cancer Neg Hx     Social History:  Social History   Tobacco Use  Smoking status: Never    Passive exposure: Current   Smokeless tobacco: Never  Vaping Use   Vaping status: Never Used  Substance Use Topics   Alcohol use: Yes    Alcohol/week: 3.0 - 4.0 standard drinks of alcohol    Types: 3 - 4 Shots of liquor per week    Comment: occ   Drug use: Never    ROS: Constitutional:  Negative for fever, chills, weight loss CV: Negative for chest pain, previous MI, hypertension Respiratory:  Negative for shortness of breath, wheezing, sleep apnea, frequent cough GI:  Negative for nausea, vomiting, bloody stool, GERD  Physical exam: BP 125/88   Pulse 84   Ht 5\' 8"  (1.727 m)   Wt 163 lb (73.9 kg)   BMI 24.78 kg/m  GENERAL  APPEARANCE:  Well appearing, well developed, well nourished, NAD HEENT:  Atraumatic, normocephalic, oropharynx clear NECK:  Supple without lymphadenopathy or thyromegaly ABDOMEN:  Soft, non-tender, no masses EXTREMITIES:  Moves all extremities well, without clubbing, cyanosis, or edema NEUROLOGIC:  Alert and oriented x 3, normal gait, CN II-XII grossly intact MENTAL STATUS:  appropriate BACK:  Non-tender to palpation, No CVAT SKIN:  Warm, dry, and intact GU:  foley in place draining clear urine  Results: None  Procedure:  VOIDING TRIAL  A voiding trial was performed in the office today.   Volume of sterile water instilled: 240 mL Foley catheter removed intact. Volume voided by patient: 200 mL Instructed to return to office if has not voided by 4 PM

## 2022-11-22 NOTE — Addendum Note (Signed)
Addended by: Lizbeth Bark on: 11/22/2022 09:32 AM   Modules accepted: Orders

## 2022-11-22 NOTE — Progress Notes (Signed)
Fill and Pull Catheter Removal  Patient is present today for a catheter removal.  Patient was cleaned and prepped in a sterile fashion of sterile water/ saline was instilled into the bladder when the patient felt the urge to urinate, 9ml of water was then drained from the balloon.  A 16FR foley cath was removed from the bladder no complications were noted .  Patient as then given some time to void on their own.  Patient can void  on their own after some time.  Patient tolerated well.  Performed by: Arville Go CMA

## 2022-11-23 ENCOUNTER — Inpatient Hospital Stay: Payer: Commercial Managed Care - HMO

## 2022-11-24 MED FILL — Dexamethasone Sodium Phosphate Inj 100 MG/10ML: INTRAMUSCULAR | Qty: 1 | Status: AC

## 2022-11-28 ENCOUNTER — Inpatient Hospital Stay: Payer: Commercial Managed Care - HMO

## 2022-11-28 ENCOUNTER — Inpatient Hospital Stay: Payer: Commercial Managed Care - HMO | Admitting: Nurse Practitioner

## 2022-11-28 ENCOUNTER — Inpatient Hospital Stay: Payer: Commercial Managed Care - HMO | Attending: Oncology

## 2022-11-28 ENCOUNTER — Encounter: Payer: Self-pay | Admitting: Oncology

## 2022-11-28 ENCOUNTER — Encounter: Payer: Self-pay | Admitting: Nurse Practitioner

## 2022-11-28 VITALS — BP 140/58 | HR 76 | Temp 98.1°F | Resp 18 | Ht 68.0 in | Wt 161.1 lb

## 2022-11-28 VITALS — BP 141/50 | HR 71 | Resp 18

## 2022-11-28 DIAGNOSIS — R197 Diarrhea, unspecified: Secondary | ICD-10-CM | POA: Diagnosis not present

## 2022-11-28 DIAGNOSIS — Z5111 Encounter for antineoplastic chemotherapy: Secondary | ICD-10-CM | POA: Diagnosis not present

## 2022-11-28 DIAGNOSIS — R21 Rash and other nonspecific skin eruption: Secondary | ICD-10-CM | POA: Insufficient documentation

## 2022-11-28 DIAGNOSIS — G620 Drug-induced polyneuropathy: Secondary | ICD-10-CM | POA: Insufficient documentation

## 2022-11-28 DIAGNOSIS — Z800 Family history of malignant neoplasm of digestive organs: Secondary | ICD-10-CM | POA: Insufficient documentation

## 2022-11-28 DIAGNOSIS — R339 Retention of urine, unspecified: Secondary | ICD-10-CM | POA: Insufficient documentation

## 2022-11-28 DIAGNOSIS — C187 Malignant neoplasm of sigmoid colon: Secondary | ICD-10-CM | POA: Diagnosis not present

## 2022-11-28 DIAGNOSIS — C787 Secondary malignant neoplasm of liver and intrahepatic bile duct: Secondary | ICD-10-CM | POA: Insufficient documentation

## 2022-11-28 DIAGNOSIS — C184 Malignant neoplasm of transverse colon: Secondary | ICD-10-CM | POA: Diagnosis not present

## 2022-11-28 DIAGNOSIS — C189 Malignant neoplasm of colon, unspecified: Secondary | ICD-10-CM

## 2022-11-28 DIAGNOSIS — K5900 Constipation, unspecified: Secondary | ICD-10-CM | POA: Insufficient documentation

## 2022-11-28 DIAGNOSIS — C180 Malignant neoplasm of cecum: Secondary | ICD-10-CM | POA: Diagnosis not present

## 2022-11-28 LAB — CBC WITH DIFFERENTIAL (CANCER CENTER ONLY)
Abs Immature Granulocytes: 0.04 10*3/uL (ref 0.00–0.07)
Basophils Absolute: 0.1 10*3/uL (ref 0.0–0.1)
Basophils Relative: 1 %
Eosinophils Absolute: 0.3 10*3/uL (ref 0.0–0.5)
Eosinophils Relative: 3 %
HCT: 34.9 % — ABNORMAL LOW (ref 39.0–52.0)
Hemoglobin: 11.3 g/dL — ABNORMAL LOW (ref 13.0–17.0)
Immature Granulocytes: 0 %
Lymphocytes Relative: 8 %
Lymphs Abs: 0.8 10*3/uL (ref 0.7–4.0)
MCH: 29.4 pg (ref 26.0–34.0)
MCHC: 32.4 g/dL (ref 30.0–36.0)
MCV: 90.9 fL (ref 80.0–100.0)
Monocytes Absolute: 1.6 10*3/uL — ABNORMAL HIGH (ref 0.1–1.0)
Monocytes Relative: 15 %
Neutro Abs: 7.2 10*3/uL (ref 1.7–7.7)
Neutrophils Relative %: 73 %
Platelet Count: 182 10*3/uL (ref 150–400)
RBC: 3.84 MIL/uL — ABNORMAL LOW (ref 4.22–5.81)
RDW: 16.3 % — ABNORMAL HIGH (ref 11.5–15.5)
WBC Count: 10.1 10*3/uL (ref 4.0–10.5)
nRBC: 0 % (ref 0.0–0.2)

## 2022-11-28 LAB — CMP (CANCER CENTER ONLY)
ALT: 34 U/L (ref 0–44)
AST: 62 U/L — ABNORMAL HIGH (ref 15–41)
Albumin: 3.3 g/dL — ABNORMAL LOW (ref 3.5–5.0)
Alkaline Phosphatase: 138 U/L — ABNORMAL HIGH (ref 38–126)
Anion gap: 6 (ref 5–15)
BUN: 14 mg/dL (ref 8–23)
CO2: 24 mmol/L (ref 22–32)
Calcium: 8.7 mg/dL — ABNORMAL LOW (ref 8.9–10.3)
Chloride: 106 mmol/L (ref 98–111)
Creatinine: 0.75 mg/dL (ref 0.61–1.24)
GFR, Estimated: >60 mL/min (ref >60)
Glucose, Bld: 126 mg/dL — ABNORMAL HIGH (ref 70–99)
Potassium: 4.2 mmol/L (ref 3.5–5.1)
Sodium: 136 mmol/L (ref 135–145)
Total Bilirubin: 0.7 mg/dL (ref 0.3–1.2)
Total Protein: 5.9 g/dL — ABNORMAL LOW (ref 6.5–8.1)

## 2022-11-28 LAB — MAGNESIUM: Magnesium: 1.6 mg/dL — ABNORMAL LOW (ref 1.7–2.4)

## 2022-11-28 LAB — CEA (ACCESS): CEA (CHCC): 212.21 ng/mL — ABNORMAL HIGH (ref 0.00–5.00)

## 2022-11-28 MED ORDER — DEXTROSE 5 % IV SOLN: Freq: Once | INTRAVENOUS | Status: AC

## 2022-11-28 MED ORDER — FAMOTIDINE IN NACL 20-0.9 MG/50ML-% IV SOLN
20.0000 mg | Freq: Once | INTRAVENOUS | Status: AC
  Administered 2022-11-28: 20 mg via INTRAVENOUS
  Filled 2022-11-28: qty 50

## 2022-11-28 MED ORDER — LEUCOVORIN CALCIUM INJECTION 350 MG
200.0000 mg/m2 | Freq: Once | INTRAVENOUS | Status: AC
  Administered 2022-11-28: 374 mg via INTRAVENOUS
  Filled 2022-11-28: qty 18.7

## 2022-11-28 MED ORDER — PALONOSETRON HCL INJECTION 0.25 MG/5ML
0.2500 mg | Freq: Once | INTRAVENOUS | Status: AC
  Administered 2022-11-28: 0.25 mg via INTRAVENOUS
  Filled 2022-11-28: qty 5

## 2022-11-28 MED ORDER — SODIUM CHLORIDE 0.9 % IV SOLN
10.0000 mg | Freq: Once | INTRAVENOUS | Status: AC
  Administered 2022-11-28: 10 mg via INTRAVENOUS
  Filled 2022-11-28: qty 10

## 2022-11-28 MED ORDER — DIPHENHYDRAMINE HCL 25 MG PO CAPS
25.0000 mg | ORAL_CAPSULE | Freq: Once | ORAL | Status: AC
  Administered 2022-11-28: 25 mg via ORAL
  Filled 2022-11-28: qty 1

## 2022-11-28 MED ORDER — OXALIPLATIN CHEMO INJECTION 100 MG/20ML
50.0000 mg/m2 | Freq: Once | INTRAVENOUS | Status: AC
  Administered 2022-11-28: 100 mg via INTRAVENOUS
  Filled 2022-11-28: qty 20

## 2022-11-28 MED ORDER — SODIUM CHLORIDE 0.9 % IV SOLN
1000.0000 mg/m2 | INTRAVENOUS | Status: DC
  Administered 2022-11-28: 2000 mg via INTRAVENOUS
  Filled 2022-11-28: qty 40

## 2022-11-28 NOTE — Patient Instructions (Signed)
Instrucciones al darle de alta: Discharge Instructions Gracias por elegir al [facility] de Cncer de [place] para brindarle atencin mdica de oncologa y Teacher, English as a foreign language.   Si usted tiene una cita de laboratorio con [employer] de [place], por favor vaya directamente al [employer] de Cncer y regstrese en el rea de Engineer, maintenance (IT).   Use ropa cmoda y [place] para tener fcil acceso a las vas del Portacath (acceso venoso de Set designer duracin) o la lnea PICC (catter central colocado por va perifrica).   Nos esforzamos por ofrecerle tiempo de calidad con su proveedor. Es posible que tenga que volver a programar su cita si llega tarde (15 minutos o ms).  El llegar tarde le afecta a usted y a otros pacientes cuyas citas son posteriores a Armed forces operational officer.  Adems, si usted falta a tres o ms citas sin avisar a la oficina, puede ser retirado(a) de la clnica a discrecin del proveedor.      Para las solicitudes de renovacin de recetas, pida a su farmacia que se ponga en contacto con nuestra oficina y deje que transcurran 72 horas para que se complete el proceso de las renovaciones.    Hoy usted recibi los siguientes agentes de quimioterapia e/o inmunoterapia Oxaliplatin (ELOXATIN), Leucovorin & Flourouracil (ADRUCIL).      Para ayudar a prevenir las nuseas y los vmitos despus de su tratamiento, le recomendamos que tome su medicamento para las nuseas segn las indicaciones.  LOS SNTOMAS QUE DEBEN COMUNICARSE INMEDIATAMENTE SE INDICAN A CONTINUACIN: *FIEBRE SUPERIOR A 100.4 F (38 C) O MS *ESCALOFROS O SUDORACIN *NUSEAS Y VMITOS QUE NO SE CONTROLAN CON EL MEDICAMENTO PARA LAS NUSEAS *DIFICULTAD INUSUAL PARA RESPIRAR  *MORETONES O HEMORRAGIAS NO HABITUALES *PROBLEMAS URINARIOS (dolor o ardor al Geographical information systems officer o frecuencia para Geographical information systems officer) *PROBLEMAS INTESTINALES (diarrea inusual, estreimiento, dolor cerca del ano) SENSIBILIDAD EN LA BOCA Y EN LA GARGANTA CON O SIN LA PRESENCIA DE LCERAS (dolor de garganta, llagas en  la boca o dolor de muelas/dientes) ERUPCIN, HINCHAZN O DOLORES INUSUALES FLUJO VAGINAL INUSUAL O PICAZN/RASQUIA    Los puntos marcados con un asterisco ( *) indican una posible emergencia y debe hacer un seguimiento tan pronto como le sea posible o vaya al Departamento de Emergencias si se le presenta algn problema.  Por favor, muestre la [place] DE ADVERTENCIA DE Marc Morgans DE ADVERTENCIA DE Gardiner Fanti al registrarse en [address] de Emergencias y a la enfermera de triaje.  Si tiene preguntas despus de su visita o necesita cancelar o volver a programar su cita, por favor pngase en contacto con [facility]  Dept: [phone]  y siga las instrucciones. Las horas de oficina son de 8:00 a.m. a 4:30 p.m. de lunes a viernes. Por favor, tenga en cuenta que los mensajes de voz que se dejan despus de las 4:00 p.m. posiblemente no se devolvern hasta el siguiente da de [place].  Cerramos los fines de semana y [employer]. En todo momento tiene acceso a una enfermera para preguntas urgentes. Por favor, llame al nmero principal de la clnica Dept: [phone] y siga las instrucciones.   Para cualquier pregunta que no sea de carcter urgente, tambin puede ponerse en contacto con su proveedor [employer] and [employer]. Ahora ofrecemos visitas electrnicas para cualquier persona mayor de 18 aos que solicite atencin mdica en lnea para los sntomas que no sean urgentes. Para ms detalles vaya a [email or web address].   Tambin puede bajar la aplicacin de MyChart! Vaya a la tienda de  aplicaciones, busque "MyChart", abra la aplicacin, seleccione , e ingrese con su nombre de usuario y la contrasea de Clinical cytogeneticist.  Oxaliplatin Injection Qu es este medicamento? El OXALIPLATINO trata el cncer colorrectal. Acta desacelerando el crecimiento de clulas cancerosas. Este medicamento puede ser utilizado para otros usos; si  tiene alguna pregunta consulte con su proveedor de atencin mdica o con su farmacutico. MARCAS COMUNES: Eloxatin Qu le debo informar a mi profesional de la salud antes de tomar este medicamento? Necesitan saber si usted presenta alguno de los siguientes problemas o situaciones: Enfermedad cardiaca Antecedentes de frecuencia o ritmo cardiaco irregular Enfermedad heptica Niveles bajos de clulas sanguneas (glbulos blancos, glbulos rojos y plaquetas) Enfermedad pulmonar o respiratoria, como asma Si [place] medicamentos que tratan o previenen cogulos sanguneos Hormigueo en los dedos de las manos o los pies, u otro trastorno del sistema nervioso Una reaccin alrgica o inusual al oxaliplatino, a otros medicamentos, alimentos, colorantes o conservantes Si usted o su pareja est embarazada o intentando quedar embarazada Si est amamantando a un beb Cmo debo Visual merchandiser medicamento? Este medicamento se inyecta en una vena. Su equipo de atencin lo [employer] en un hospital o en un entorno clnico. Hable con su equipo de atencin sobre el uso de este medicamento en nios. Puede requerir atencin especial. Sobredosis: Pngase en contacto inmediatamente con un centro toxicolgico o una sala de urgencia si usted cree que haya tomado demasiado medicamento.<br>ATENCIN: [employer] es solo para usted. No comparta este medicamento con nadie. Qu sucede si me olvido de una dosis? Cumpla con las citas para dosis de seguimiento. Es importante no olvidar ninguna dosis. Llame a su equipo de atencin si no puede asistir a una cita. Qu puede interactuar con este medicamento? No use este medicamento con ninguno de los siguientes productos: Cisaprida Dronedarona Pimozida Tioridazina Este medicamento tambin podra interactuar con los siguientes productos: Aspirina y otros medicamentos tipo aspirina Ciertos medicamentos que tratan o previenen cogulos sanguneos, tales como warfarina, apixabn,  dabigatrn y rivaroxabn Cisplatino Ciclosporina Diurticos Medicamentos para las infecciones tales como aciclovir, adefovir, anfotericina B, bacitracina, cidofovir, foscarnet, ganciclovir, gentamicina, pentamidina, vancomicina AINE, medicamentos para el dolor y la inflamacin, tales como ibuprofeno o naproxeno Otros medicamentos que causan cambios en el ritmo cardiaco Pamidronato cido zoledrnico Puede ser que esta lista no menciona todas las posibles interacciones. Informe a su profesional de [employer] de [employer] Inc productos a base de hierbas, medicamentos de [place] o suplementos nutritivos que est tomando. Si usted fuma, consume bebidas alcohlicas o si utiliza drogas ilegales, indqueselo tambin a su profesional de [employer]. Algunas sustancias pueden interactuar con su medicamento. A qu debo estar atento al usar [employer] Corporation? Se supervisar su estado de salud atentamente mientras reciba este medicamento. Usted podra necesitar realizarse [employer] de sangre mientras est usando [place]. Este medicamento podra hacerle sentir un Risk analyst. Esto no es inusual, ya que la quimioterapia puede afectar tanto a las clulas sanas como a las clulas cancerosas. Si presenta algn efecto secundario, infrmelo. Contine con el tratamiento incluso si se siente enfermo, a menos que su equipo de [employer] lo suspenda. Este medicamento puede aumentar su riesgo de contraer una infeccin. Llame para pedir consejo a su equipo de atencin si tiene fiebre, escalofros, dolor de garganta o cualquier otro sntoma de resfriado o gripe. No se trate usted mismo. Trate de no acercarse a personas que estn enfermas. Evite usar medicamentos que contengan aspirina, acetaminofeno, ibuprofeno, naproxeno o ketoprofeno, a menos que as  lo indique su equipo de atencin. Estos medicamentos pueden ocultar la fiebre. Proceda con cuidado al cepillar sus dientes, usar hilo dental o Chemical engineer  palillos para los dientes, ya que podra contraer una infeccin o Geophysicist/field seismologist con mayor facilidad. Si recibe algn tratamiento dental, informe a su dentista que est [employer]. Este medicamento puede aumentar su sensibilidad al fro. No beba bebidas fras ni use hielo. Cubra la piel expuesta antes de entrar en contacto con temperaturas bajas u objetos fros. Cuando est a la [employer], use ropa [place] y cbrase la boca y la nariz para calentar el aire que ingresa a los pulmones. Informe a su equipo de atencin si se vuelve sensible al fro. Informe a su equipo de atencin si usted o su pareja est embarazada o si cree que alguna de las [place] podra [address]. Este medicamento puede causar defectos congnitos graves si se [place] durante el embarazo y por 9 meses despus de la ltima dosis. Se requiere una prueba de embarazo con resultado negativo antes de Games developer a Producer, television/film/video. Se recomienda Chemical engineer un mtodo anticonceptivo confiable mientras est usando este medicamento y durante 9 meses despus de la ltima dosis. Hable con su equipo de atencin sobre mtodos anticonceptivos eficaces. No embarace a Careers information officer est usando este medicamento y durante 6 meses despus de la ltima dosis. Use un condn durante las relaciones sexuales que mantenga en [place]. No debe amamantar a un beb mientras [place] este medicamento y por 3 meses despus de la ltima dosis. Este medicamento podra causar infertilidad. Hable con su equipo de atencin si le preocupa su fertilidad. Qu efectos secundarios puedo tener al [employer] este medicamento? Efectos secundarios que debe informar a su equipo de atencin tan pronto como sea posible: Reacciones alrgicas: erupcin cutnea, comezn/picazn, urticaria, hinchazn de la cara, los labios, la lengua o la garganta Sangrado: heces con [place], o de color negro y Water engineer alquitranado, vomitar sangre o material marrn que tiene el aspecto  de posos (residuos) de caf, [place] de color rojo o marrn oscuro, pequeas manchas rojas o moradas en la piel, sangrado o moretones inusuales Tos seca, falta de aire o problemas para Probation officer en el ritmo cardiaco: frecuencia cardiaca rpida o irregular, mareos, sensacin de desmayo o aturdimiento, Journalist, newspaper, dificultad para respirar Infeccin: fiebre, escalofros, tos, dolor de garganta, heridas que no sanan, dolor o problemas para Geographical information systems officer, sensacin general de molestia o Media planner en el hgado: dolor en la regin abdominal superior derecha, prdida de apetito, nuseas, heces de color claro, orina amarilla oscura o marrn, color amarillento de los ojos o la piel, debilidad o fatiga inusuales Recuento bajo de glbulos rojos: debilidad o fatiga inusuales, mareo, Engineer, mining de Training and development officer, dificultad para respirar Lesin muscular: debilidad o fatiga inusuales, dolor muscular, orina amarilla oscura o marrn, disminucin de la cantidad de [place] Dolor, hormigueo o entumecimiento de las manos o los pies Dolor de cabeza repentino e intenso, confusin, cambio en la visin, convulsiones, que podran ser signos de sndrome de encefalopata posterior reversible Sangrado o moretones inusuales Efectos secundarios que generalmente no requieren atencin mdica (debe informarlos a su equipo de atencin si persisten o si son molestos): Diarrea Therapist, sports, enrojecimiento o hinchazn con llagas dentro de la boca o la garganta Debilidad o fatiga inusuales Vmito Puede ser que esta lista no menciona todos los posibles efectos secundarios. Comunquese a su mdico por asesoramiento mdico Hewlett[employer]ard. Usted puede [employer] secundarios a  la FDA por telfono al 1-800-FDA-1088. Dnde debo guardar mi medicina? Este medicamento se administra en hospitales o clnicas. No se guarda en su casa. <b>ATENCIN: Este folleto es un resumen. Puede ser que no cubra toda la posible informacin.  Si usted tiene preguntas acerca de esta medicina, consulte con su mdico, su farmacutico o su profesional de Radiographer, therapeutic.</b>  2024 Elsevier/Gold Standard (2022-05-16 00:00:00)  Leucovorin Injection Qu es este medicamento? La LEUC[place]ORINA previene los efectos secundarios de ciertos medicamentos, Consulting civil engineer. Acta [employer] niveles de folato. Esto ayuda a proteger las clulas sanas del cuerpo. Tambin podra usarse para tratar la anemia causada por niveles bajos de folato. Tambin se puede Chemical engineer con fluorouracilo, un tipo de quimioterapia, para Tax adviser. Acta [employer] del fluorouracilo en el cuerpo. Este medicamento puede ser utilizado para otros usos; si tiene alguna pregunta consulte con su proveedor de atencin mdica o con su farmacutico. Qu le debo informar a mi profesional de la salud antes de tomar este medicamento? Necesitan saber si usted presenta alguno de los [employer] o situaciones: Anemia por niveles bajos de vitamina B12 en la sangre Una reaccin alrgica o inusual a la leucovorina, al cido flico, a otros medicamentos, alimentos, colorantes o conservantes Si est embarazada o buscando quedar embarazada Si est amamantando a un beb Cmo debo [employer]? Este medicamento se inyecta en una vena o un msculo. Su equipo de atencin lo [employer] en un hospital o en un entorno clnico. Hable con su equipo de atencin sobre el uso de este medicamento en nios. Puede requerir atencin especial. Sobredosis: Pngase en contacto inmediatamente con un centro toxicolgico o una sala de urgencia si usted cree que haya tomado demasiado medicamento.<br>ATENCIN: [employer] es solo para usted. No comparta este medicamento con nadie. Qu sucede si me olvido de una dosis? Cumpla con las citas para dosis de seguimiento. Es importante no olvidar ninguna dosis. Llame a su equipo de atencin si no puede asistir a una  cita. Qu puede interactuar con este medicamento? Capecitabina Fluorouracilo Fenobarbital Fenitona Primidona Trimetoprima;sulfametoxasol Puede ser que esta lista no menciona todas las posibles interacciones. Informe a su profesional de [employer] de In[employer] Inc productos a base de hierbas, medicamentos de [place] o suplementos nutritivos que est tomando. Si usted fuma, consume bebidas alcohlicas o si utiliza drogas ilegales, indqueselo tambin a su profesional de [employer]. Algunas sustancias pueden interactuar con su medicamento. A qu debo estar atento al usar [employer]? Se supervisar su estado de salud atentamente mientras reciba este medicamento. Este medicamento podra aumentar los efectos secundarios del 5-fluorouracilo. Informe a su equipo de atencin si tiene diarrea o llagas en la boca que no mejoran o empeoran. Qu efectos secundarios puedo tener al [employer] este medicamento? Efectos secundarios que debe informar a su equipo de atencin tan pronto como sea posible: Reacciones alrgicas: erupcin cutnea, comezn/picazn, urticaria, hinchazn de la cara, los labios, la lengua o la garganta Puede ser que esta lista no menciona todos los posibles efectos secundarios. Comunquese a su mdico por asesoramiento mdico Hewlett[employer]ard. Usted puede informar los efectos secundarios a la FDA por telfono al 1-800-FDA-1088. Dnde debo guardar mi medicina? Este medicamento se administra en hospitales o clnicas. No se guarda en su casa. <b>ATENCIN: Este folleto es un resumen. Puede ser que no cubra toda la posible informacin. Si usted tiene preguntas acerca de esta medicina, consulte con su mdico, su farmacutico o su profesional de Radiographer, therapeutic.</b>  2024 Elsevier/Gold Standard (2022-03-01 00:00:00)  Fluorouracil Injection Qu es este medicamento? El FLUOROURACILO trata algunos tipos de cncer. Acta desacelerando el crecimiento de clulas cancerosas. Este  medicamento puede ser utilizado para otros usos; si tiene alguna pregunta consulte con su proveedor de atencin mdica o con su farmacutico. MARCAS COMUNES: Adrucil Qu le debo informar a mi profesional de la salud antes de tomar este medicamento? Necesitan saber si usted presenta alguno de los siguientes problemas o situaciones: Trastornos sanguneos Deficiencia de dihidropirimidina deshidrogenasa (DPD) Infecciones, tales como varicela, fuegos labiales, herpes Enfermedad renal Enfermedad heptica Desnutricin [place] de radiacin en curso o reciente [place] reaccin alrgica o inusual al fluorouracilo, a otros medicamentos, alimentos, colorantes o conservantes Si usted o su pareja est embarazada o intentando quedar embarazada Si est amamantando a un beb Cmo debo Visual merchandiser medicamento? Este medicamento se inyecta en una vena. Su equipo de atencin lo [employer] en un hospital o en un entorno clnico. Hable con su equipo de atencin sobre el uso de este medicamento en nios. Puede requerir atencin especial. Sobredosis: Pngase en contacto inmediatamente con un centro toxicolgico o una sala de urgencia si usted cree que haya tomado demasiado medicamento.<br>ATENCIN: [employer] es solo para usted. No comparta este medicamento con nadie. Qu sucede si me olvido de una dosis? Cumpla con las citas para dosis de seguimiento. Es importante no olvidar ninguna dosis. Llame a su equipo de atencin si no puede asistir a una cita. Qu puede interactuar con este medicamento? No use este medicamento con ninguno de los siguientes productos: Administrator, arts de virus vivos Este medicamento tambin podra Product/process development scientist con los siguientes productos: Medicamentos que tratan o previenen cogulos sanguneos, tales como warfarina, enoxaparina, dalteparina Puede ser que esta lista no menciona todas las posibles interacciones. Informe a su profesional de [employer] de In[employer] Inc productos a base de hierbas,  medicamentos de [place] o suplementos nutritivos que est tomando. Si usted fuma, consume bebidas alcohlicas o si utiliza drogas ilegales, indqueselo tambin a su profesional de [employer]. Algunas sustancias pueden interactuar con su medicamento. A qu debo estar atento al usar [employer] Corporation? Se supervisar su estado de salud atentamente mientras reciba este medicamento. Este medicamento podra hacerle sentir un Risk analyst. Esto no es inusual, ya que la quimioterapia puede afectar tanto a las clulas sanas como a las clulas cancerosas. Si presenta algn efecto secundario, infrmelo. Contine con el tratamiento incluso si se siente enfermo, a menos que su equipo de [employer] lo suspenda. En algunos casos, podra recibir [employer] para ayudarle con los efectos secundarios. Siga todas las instrucciones para usarlos. Este medicamento puede aumentar su riesgo de contraer una infeccin. Llame para pedir consejo a su equipo de atencin si tiene fiebre, escalofros, dolor de garganta o cualquier otro sntoma de resfriado o gripe. No se trate usted mismo. Trate de no acercarse a personas que estn enfermas. Este medicamento podra aumentar el riesgo de moretones o sangrado. Llame a su equipo de atencin si observa sangrados inusuales. Proceda con cuidado al cepillar sus dientes, usar hilo dental o Chemical engineer palillos para los dientes, ya que podra contraer una infeccin o Geophysicist/field seismologist con mayor facilidad. Si recibe algn tratamiento dental, informe a su dentista que est [employer]. Evite usar medicamentos que contengan aspirina, acetaminofeno, ibuprofeno, naproxeno o ketoprofeno, a menos que as lo indique su equipo de atencin. Estos medicamentos pueden ocultar la fiebre. No trate la diarrea con productos de [employer]. Contacte a su equipo de atencin si  tiene diarrea por ms de [address]  [address], o si es grave y [place]. Este medicamento puede aumentar su sensibilidad al  sol. Evite la [employer]. Si no la Network engineer, utilice ropa protectora y crema de Orthoptist. No utilice lmparas solares, camas solares ni cabinas solares. Informe a su equipo de atencin si usted o su pareja est buscando un embarazo o si cree que podra estar embarazada. Este medicamento puede causar defectos congnitos graves si se [place] durante el embarazo y por 3 meses despus de la ltima dosis. Se recomienda Chemical engineer un mtodo anticonceptivo confiable mientras est usando este medicamento y durante 3 meses despus de la ltima dosis. Hable con su equipo de atencin sobre mtodos anticonceptivos eficaces. No embarace a Careers information officer est usando este medicamento y durante 3 meses despus de la ltima dosis. Use un condn durante las relaciones sexuales que mantenga en [place]. No debe amamantar a un beb mientras est [employer]. Este medicamento podra causar infertilidad. Hable con su equipo de atencin si le preocupa su fertilidad. Qu efectos secundarios puedo tener al [employer] este medicamento? Efectos secundarios que debe informar a su equipo de atencin tan pronto como sea posible: Reacciones alrgicas: erupcin cutnea, comezn/picazn, urticaria, hinchazn de la cara, los labios, la lengua o la garganta Ataque cardiaco: Engineer, mining u opresin en el pecho, los hombros, los brazos o la [place], nuseas, falta de [place], piel fra o sudorosa, sensacin de Secretary o aturdimiento Insuficiencia cardiaca: falta de aire, hinchazn de los tobillos, los pies o las manos, aumento de peso repentino, debilidad o fatiga inusuales Cambios en el ritmo cardiaco: frecuencia cardiaca rpida o irregular, mareos, sensacin de desmayo o aturdimiento, dolor en el pecho, dificultad para respirar Niveles elevados de amonaco: debilidad o fatiga inusuales, confusin, prdida de apetito, nuseas, vmitos, convulsiones Infeccin: fiebre, escalofros, tos, dolor de garganta, heridas que no sanan,  dolor o problemas para Geographical information systems officer, sensacin general de molestia o malestar Recuento bajo de glbulos rojos: debilidad o fatiga inusuales, mareo, dolor de cabeza, dificultad para Dietitian, hormigueo o entumecimiento en las manos o los pies, debilidad muscular, cambios en la visin, confusin o dificultad para hablar, prdida del equilibrio o la coordinacin, dificultad para caminar, convulsiones Enrojecimiento, hinchazn y [employer] en la piel de las manos y los pies Diarrea grave o prolongada Sangrado o moretones inusuales Efectos secundarios que generalmente no requieren atencin mdica (debe informarlos a su equipo de atencin si persisten o si son molestos): Piel seca Dolor de cabeza Aumento de Armed forces technical officer, enrojecimiento o hinchazn con llagas dentro de la boca o la garganta Sensibilidad a la luz Vmito Puede ser que esta lista no menciona todos los posibles efectos secundarios. Comunquese a su mdico por asesoramiento mdico [employer]lett[employer]ard. Usted puede informar los efectos secundarios a la FDA por telfono al 1-800-FDA-1088. Dnde debo guardar mi medicina? Este medicamento se administra en hospitales o clnicas. No se guarda en su casa. <b>ATENCIN: Este folleto es un resumen. Puede ser que no cubra toda la posible informacin. Si usted tiene preguntas acerca de esta medicina, consulte con su mdico, su farmacutico o su profesional de Radiographer, therapeutic.</b>  2024 Elsevier/Gold Standard (2022-03-01 00:00:00)  The chemotherapy medication bag should finish at 46 hours, 96 hours, or 7 days. For example, if your pump is scheduled for 46 hours and it was put on at 4:00 p.m., it should finish at 2:00 p.m. the day it is scheduled to come off regardless of your appointment time.  Estimated time to finish at 1:00 p.m. on Thursday 11/30/2022.   If the display on your pump reads "Low Volume" and it is beeping, take the batteries out of the pump and come to the cancer center  for it to be taken off.   If the pump alarms go off prior to the pump reading "Low Volume" then call [phone] and someone can assist you.  If the plunger comes out and the chemotherapy medication is leaking out, please use your home chemo spill kit to clean up the spill. Do NOT use paper towels or other household products.  If you have problems or questions regarding your pump, please call either [phone] (24 hours a day) or the cancer center Monday-Friday 8:00 a.m.- 4:30 p.m. at the clinic number and we will assist you. If you are unable to get assistance, then go to the nearest Emergency Department and ask the staff to contact the IV team for assistance.

## 2022-11-28 NOTE — Progress Notes (Signed)
Pharmacist Chemotherapy Monitoring - Initial Assessment    Anticipated start date: 11/28/22   The following has been reviewed per standard work regarding the patient's treatment regimen: The patient's diagnosis, treatment plan and drug doses, and organ/hematologic function Lab orders and baseline tests specific to treatment regimen  The treatment plan start date, drug sequencing, and pre-medications Prior authorization status  Patient's documented medication list, including drug-drug interaction screen and prescriptions for anti-emetics and supportive care specific to the treatment regimen The drug concentrations, fluid compatibility, administration routes, and timing of the medications to be used The patient's access for treatment and lifetime cumulative dose history, if applicable  The patient's medication allergies and previous infusion related reactions, if applicable   Changes made to treatment plan:  N/A  Follow up needed:  N/A  Additional premedication added by MD. Previous Folfox in the past.   John Vance, [facility], 11/28/2022  11:19 AM

## 2022-11-28 NOTE — Progress Notes (Signed)
Patient's daughter verbalized to this nurse that they have his Udenyca Injection at home and the son will administer on pump stop day Thursday 11/30/2022.

## 2022-11-28 NOTE — Progress Notes (Addendum)
Patient seen by Lisa Thomas NP today  Vitals are within treatment parameters.  Labs reviewed by Lisa Thomas NP and are within treatment parameters.  Per physician team, patient is ready for treatment and there are NO modifications to the treatment plan.     

## 2022-11-28 NOTE — Progress Notes (Signed)
[facility] OFFICE PROGRESS NOTE   Diagnosis: Colon cancer  INTERVAL HISTORY:   John Vance returns as scheduled.  He is seen prior to proceeding with cycle 1 FOLFOX.  He is feeling better.  The skin rash is improving.  Mouth sores have resolved.  Overall good appetite.  Occasional mild pain at the right lower abdomen.  He intermittently notes cold sensitivity.  No numbness or tingling in the absence of cold exposure.  Foley catheter has been removed.  He is urinating without difficulty.  Objective:  Vital signs in last 24 hours:  Blood pressure (!) 140/58, pulse 76, temperature 98.1 F (36.7 C), temperature source Oral, resp. rate 18, height 5\' 8"  (1.727 m), weight 161 lb 1.6 oz (73.1 kg), SpO2 100%.    HEENT: No thrush or ulcers. Resp: Lungs clear bilaterally. Cardio: Regular rate and rhythm. GI: Abdomen soft, mild tenderness at the right abdomen.  No hepatomegaly.  No mass. Vascular: No leg edema. Skin: Acne type rash at the upper chest appears improved.  Erythematous rash at the forearms has improved as well. Port-A-Cath without erythema.  Lab Results:  Lab Results  Component Value Date   WBC 10.1 11/28/2022   HGB 11.3 (L) 11/28/2022   HCT 34.9 (L) 11/28/2022   MCV 90.9 11/28/2022   PLT 182 11/28/2022   NEUTROABS 7.2 11/28/2022    Imaging:  No results found.  Medications: I have reviewed the patient's current medications.  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), transverse colon cancer stage IIB (T4aN0) status post extended right hemicolectomy 05/07/2019, MSS Tumor invasive superficial portion of the muscle ("internal third "), no tumor perforation, no vascular invasion or perineural invasion, negative resection margins, 0/4 lymph nodes Elevated CEA 02/10/2019 CTs 03/05/2019, compared to outside CTs from 11/22/2018-worsened wall  thickening in the cecum stable apple core lesion in the mid transverse colon, no evidence of recurrent tumor at the distal colonic anastomosis, no evidence of metastatic disease Colonoscopy 03/24/2019 20-2 malignant appearing masses noted in the colon, 1 filled the cecum measuring approximately 4 cm across and friable.  The other malignant appearing mass was circumferential creating a stricture with a 1.2 cm lumen located in the transverse segment approximately 4 to 5 cm in length.  There were 12-18 neoplastic appearing polyps in between the 2 obvious malignant masses ranging in size from 3 mm to 2 cm.  5 polyps distal to the transverse colon mass.  2 sigmoid colon polyps.  Cecum mass positive for adenocarcinoma.  Transverse colon mass positive for adenocarcinoma. Foundation 1 transverse colon-MSS, tumor mutation burden 7, KRAS wild-type, BRAF fusion 05/07/2019 laparoscopic extended right hemicolectomy, lysis of adhesions-5 cm invasive moderately differentiated adenocarcinoma involving cecum and proximal ascending colon, carcinoma invades into the pericolonic soft tissue; 7 cm invasive moderately differentiated carcinoma involving the transverse colon, carcinoma invades into the serosal surface (visceral peritoneum); all resection margins negative for carcinoma.  Lymphovascular invasion present.  In the proximal colon metastatic carcinoma involves 3 of 17 lymph nodes with 1 tumor deposit.  In the transverse colon 12 lymph nodes negative for metastatic carcinoma.  3 separate polyps: Tubular adenoma, inflammatory polyp and hyperplastic polyp Cycle 1 capecitabine 06/02/2019 Cycle 2 capecitabine 06/23/2019 Cycle 3 capecitabine 07/14/2019 Cycle 4 capecitabine 08/04/2019 Cycle 5 capecitabine 08/25/2019 Cycle 6 capecitabine 09/15/2019 Cycle 7 capecitabine 10/06/2019 Cycle 8 capecitabine 10/27/2019 Upper endoscopy 07/23/2020-negative Colonoscopy 07/23/2020-normal-appearing anastomoses, polyp removed from the descending  colon-tubular adenoma CTs 01/18/2021-right abdominal mesenteric mass, mass in the low anatomic pelvis consistent with metastases Cycle 1 FOLFOX 02/21/2021 Cycle 2 FOLFOX 03/07/2021, dose reductions made due to mild neutropenia Cycle 3 FOLFOX 03/21/2021 Cycle 4 FOLFOX 04/04/2021 Cycle 5 FOLFOX 04/19/2021 Cycle 6 FOLFOX 05/02/2021 CTs 05/12/2021-decrease size of nodular right mesenteric mass, complete resolution of soft tissue enhancing nodule in the right inguinal canal, resolution of nodular soft tissue mass posterior to the bladder, enlargement of a left upper lobe nodule and new 4 mm left upper lobe nodule-indeterminate Cycle 7 FOLFOX 05/16/2021 Cycle 8 FOLFOX 05/30/2021, oxaliplatin and 5-FU bolus held due to neuropathy and thrombocytopenia Cycle 9 FOLFOX 06/13/2021, oxaliplatin dose reduced Cycle 10 FOLFOX 06/27/2021 CT 07/07/2021-decrease in right mesenteric mass, decreased size of lobulated left upper lobe nodule, resolution of previous "new "left upper lobe nodule, new area of wall thickening at the distal transverse colon Treatment changed to Xeloda 2 weeks on/1 week off beginning 07/18/2021 Cycle 2 Xeloda beginning 08/09/2021 Cycle 3 Xeloda beginning 08/30/2021 Cycle 4 Xeloda beginning 09/21/2021 Cycle 5 Xeloda beginning 10/12/2021 Cycle 6 Xeloda beginning 11/02/2021 CTs 11/14/2021-right lower quadrant spiculated small bowel mesenteric mass measured 2.8 x 3.7 cm, previously 1.0 x 2.3 cm; adjacent mesenteric haziness and nodularity measuring up to 8 mm superiorly.  New 4 mm nodular lesion posterior segment right upper lobe along the major fissure. Treatment break beginning 11/17/2021 CTs 03/21/2022-multiple new liver metastases, enlarging implant in the right ileocolic mesentery, enlarging peritoneal implants in the low pelvis Cycle 1 FOLFIRI/Avastin 03/29/2022 Cycle 2 FOLFIRI/Avastin 04/11/2022 Cycle 3 FOLFIRI/Avastin 04/26/2022 Cycle 4 FOLFIRI/Avastin 05/10/2022 Cycle 5 FOLFIRI/Avastin 05/24/2022 CT  abdomen/pelvis 06/02/2022-liver metastases are stable and smaller, smaller peritoneal/mesenteric nodules Cycle 6 FOLFIRI/Avastin 06/06/2022 Cycle 7 FOLFIRI/Avastin 06/20/2022 Cycle 8 FOLFIRI/Avastin 07/04/2022 Treatment held 07/18/2022 due to neutropenia Cycle 9 FOLFIRI/Avastin 07/24/2022, Udenyca Cycle 10 FOLFIRI/Avastin 08/08/2022, Udenyca CTs 08/18/2022-slight enlargement of several liver lesions, right mesenteric mass and low pelvic soft tissue nodule are stable Cycle 1 FOLFIRI/Panitumumab 08/29/2022 Cycle 2 FOLFIRI/Panitumumab 09/12/2022 Cycle 3 FOLFIRI/panitumumab 09/26/2022 Cycle 4 FOLFIRI/Panitumumab 10/23/2022, Panitumumab dose reduced Cycle 5 FOLFIRI/panitumumab 11/07/2022 CT 11/20/2022-interval progression with increased size and number of liver metastases; new enlarged right juxta caval lymph node; slight increase in size of nodal mass within the right ileocolic mesentery; slight increase in size of subserosal nodule along the anterior aspect of the proximal rectum Cycle 1 FOLFOX 11/28/2022 2.   Microcytic anemia secondary to #1.    Resolved 3.   Family history of pancreas and colon cancer, negative genetic testing per the [facility] panel, ATM variant of unknown significance 4.   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  Urinary retention 11/01/2022-Foley catheter placed; catheter removed 11/22/2022, reports urinating without difficulty 11/28/2022  Disposition: [name] appears stable.  He is scheduled to begin retreatment with FOLFOX today.  We again reviewed potential side effects.  He understands the risk of an allergic reaction and worsening of neuropathy symptoms.  He agrees to proceed.  CBC reviewed.  Counts adequate to proceed with treatment.  Chemistry panel is pending.  He will return for follow-up and cycle 2 FOLFOX in 2 weeks.  We are available  to see him sooner if needed.    Lonna Cobb ANP/GNP-BC   11/28/2022  10:24 AM

## 2022-11-28 NOTE — Addendum Note (Signed)
Addended by: Rana Snare on: 11/28/2022 10:33 AM   Modules accepted: Orders

## 2022-11-29 ENCOUNTER — Ambulatory Visit: Payer: Commercial Managed Care - HMO | Admitting: Urology

## 2022-11-29 ENCOUNTER — Other Ambulatory Visit: Payer: Self-pay

## 2022-11-29 ENCOUNTER — Encounter: Payer: Self-pay | Admitting: Urology

## 2022-11-29 VITALS — BP 145/54 | HR 72 | Ht 68.0 in | Wt 163.0 lb

## 2022-11-29 DIAGNOSIS — N401 Enlarged prostate with lower urinary tract symptoms: Secondary | ICD-10-CM

## 2022-11-29 DIAGNOSIS — N138 Other obstructive and reflux uropathy: Secondary | ICD-10-CM

## 2022-11-29 DIAGNOSIS — R339 Retention of urine, unspecified: Secondary | ICD-10-CM | POA: Diagnosis not present

## 2022-11-29 DIAGNOSIS — Z87898 Personal history of other specified conditions: Secondary | ICD-10-CM

## 2022-11-29 LAB — BLADDER SCAN AMB NON-IMAGING

## 2022-11-29 NOTE — Progress Notes (Signed)
Assessment: 1. BPH with obstruction/lower urinary tract symptoms   2. History of urinary retention     Plan: Continue silodosin 8 mg daily.  Return to office in 6-8 weeks  Chief Complaint:  Chief Complaint  Patient presents with   Benign Prostatic Hypertrophy    History of Present Illness:  [name] is a 82 y.o. male who is seen for continued evaluation of urinary retention. He has a history of colon cancer and is status post a right hemicolectomy.  He is currently undergoing chemotherapy. He presented to the emergency room on 11/01/2022 with dysuria and difficulty voiding.  Postvoid residual was 300 mL.  A Foley catheter was placed with return of 600 ml.  He was started on Keflex although his urine culture eventually showed no growth.  He has returned to the emergency room on several occasions for problems with drainage from the Foley catheter.  He has had the Foley catheter exchanged twice.  CT abdomen and pelvis with contrast from 4/24 showed multiple liver metastases, right Hemi abdominal mesenteric mass, small soft tissue nodule in the lower pelvis stable in size, bilateral renal cyst, slight bladder wall thickening with trabeculation, enlarged prostate with mass effect along the base of the bladder.  At his visit on 11/15/22, his Foley catheter was been draining well.  He had not required any irrigation of the catheter for approximately 10 days.  No gross hematuria. No prior history of urinary retention.  He had not been on any medication for BPH symptoms. His Foley was removed the office and the patient was able to void almost to completion.  He was started on silodosin 8 mg daily. He returned later that day unable to void.  A Foley catheter was reinserted with return of 280 mL. His Foley was removed on 11/22/2022 following a successful voiding trial.  He returns today for follow-up.  He continues on silodosin.  He reports that he is doing well from a urinary  standpoint.  He reports some intermittent stream and straining to void.  No dysuria or gross hematuria. IPSS = 18 today.  Portions of the above documentation were copied from a prior visit for review purposes only.   Past Medical History:  Past Medical History:  Diagnosis Date   Anemia    Blood transfusion without reported diagnosis    Colon cancer (HCC)    History of kidney stones     Past Surgical History:  Past Surgical History:  Procedure Laterality Date   APPENDECTOMY     COLON SURGERY     in [place], Aug 2020   CYST REMOVAL HAND Left    shoulder   LAPAROSCOPIC RIGHT HEMI COLECTOMY Right 05/07/2019   Procedure: LAPAROSCOPIC RIGHT HEMI COLECTOMY;  Surgeon: Andria Meuse, MD;  Location: [facility];  Service: General;  Laterality: Right;   PORTACATH PLACEMENT N/A 02/07/2021   Procedure: PORT-A-CATH PLACEMENT RIGHT IJ WITH ULTRASOUND GUIDANCE AND X-RAY;  Surgeon: Andria Meuse, MD;  Location: [facility];  Service: General;  Laterality: N/A;   TONSILLECTOMY      Allergies:  No Known Allergies  Family History:  Family History  Problem Relation Age of Onset   Breast cancer Mother 14       spread to pancreas   Pancreatic cancer Mother    Colon cancer Paternal Uncle 42   Leukemia Sister 73       acute - died 2 months after diagnosis   Cancer Niece 21  unknown cancer of the nose   Stomach cancer Neg Hx    Esophageal cancer Neg Hx    Rectal cancer Neg Hx     Social History:  Social History   Tobacco Use   Smoking status: Never    Passive exposure: Current   Smokeless tobacco: Never  Vaping Use   Vaping status: Never Used  Substance Use Topics   Alcohol use: Yes    Alcohol/week: 3.0 - 4.0 standard drinks of alcohol    Types: 3 - 4 Shots of liquor per week    Comment: occ   Drug use: Never    ROS: Constitutional:  Negative for fever, chills, weight loss CV: Negative for chest pain, previous MI, hypertension Respiratory:  Negative for shortness  of breath, wheezing, sleep apnea, frequent cough GI:  Negative for nausea, vomiting, bloody stool, GERD  Physical exam: BP (!) 145/54   Pulse 72   Ht 5\' 8"  (1.727 m)   Wt 163 lb (73.9 kg)   BMI 24.78 kg/m  GENERAL APPEARANCE:  Well appearing, well developed, well nourished, NAD HEENT:  Atraumatic, normocephalic, oropharynx clear NECK:  Supple without lymphadenopathy or thyromegaly ABDOMEN:  Soft, non-tender, no masses EXTREMITIES:  Moves all extremities well, without clubbing, cyanosis, or edema NEUROLOGIC:  Alert and oriented x 3, normal gait, CN II-XII grossly intact MENTAL STATUS:  appropriate BACK:  Non-tender to palpation, No CVAT SKIN:  Warm, dry, and intact  Results: U/A: negative   PVR: 156 ml

## 2022-11-30 ENCOUNTER — Inpatient Hospital Stay: Payer: Commercial Managed Care - HMO

## 2022-11-30 VITALS — BP 131/51 | HR 61 | Temp 97.4°F | Resp 18

## 2022-11-30 DIAGNOSIS — Z5111 Encounter for antineoplastic chemotherapy: Secondary | ICD-10-CM | POA: Diagnosis not present

## 2022-11-30 DIAGNOSIS — C189 Malignant neoplasm of colon, unspecified: Secondary | ICD-10-CM

## 2022-11-30 MED ORDER — HEPARIN SOD (PORK) LOCK FLUSH 100 UNIT/ML IV SOLN
500.0000 [IU] | Freq: Once | INTRAVENOUS | Status: AC | PRN
  Administered 2022-11-30: 500 [IU]

## 2022-11-30 MED ORDER — SODIUM CHLORIDE 0.9% FLUSH
10.0000 mL | INTRAVENOUS | Status: DC | PRN
  Administered 2022-11-30: 10 mL

## 2022-11-30 NOTE — Patient Instructions (Signed)

## 2022-11-30 NOTE — Progress Notes (Signed)
Pt presented to pump stop appt with unused udenyca injection. Re-education given regarding pt getting this injection at home. Pt verbalizes understanding and confirms that his son will give him the injection this evening. Interpreter present.

## 2022-12-04 ENCOUNTER — Telehealth: Payer: Self-pay | Admitting: *Deleted

## 2022-12-04 NOTE — Telephone Encounter (Signed)
Son called to report that patient was having pain in lower back down his leg on 8/10 and 8/11. No swelling or redness. Took some does of Tramadol and not in pain today. Suggested continued observation and call if it becomes persistent.

## 2022-12-10 ENCOUNTER — Other Ambulatory Visit: Payer: Self-pay | Admitting: Oncology

## 2022-12-10 DIAGNOSIS — C189 Malignant neoplasm of colon, unspecified: Secondary | ICD-10-CM

## 2022-12-12 ENCOUNTER — Inpatient Hospital Stay: Payer: Commercial Managed Care - HMO

## 2022-12-12 ENCOUNTER — Inpatient Hospital Stay: Payer: Commercial Managed Care - HMO | Admitting: Nurse Practitioner

## 2022-12-12 ENCOUNTER — Encounter: Payer: Self-pay | Admitting: Nurse Practitioner

## 2022-12-12 VITALS — BP 131/58 | HR 74 | Temp 98.1°F | Resp 18 | Ht 68.0 in | Wt 154.5 lb

## 2022-12-12 VITALS — BP 135/60 | HR 70

## 2022-12-12 DIAGNOSIS — C189 Malignant neoplasm of colon, unspecified: Secondary | ICD-10-CM

## 2022-12-12 DIAGNOSIS — Z5111 Encounter for antineoplastic chemotherapy: Secondary | ICD-10-CM | POA: Diagnosis not present

## 2022-12-12 LAB — CBC WITH DIFFERENTIAL (CANCER CENTER ONLY)
Abs Immature Granulocytes: 0.1 10*3/uL — ABNORMAL HIGH (ref 0.00–0.07)
Basophils Absolute: 0.1 10*3/uL (ref 0.0–0.1)
Basophils Relative: 0 %
Eosinophils Absolute: 0.2 10*3/uL (ref 0.0–0.5)
Eosinophils Relative: 1 %
HCT: 35.1 % — ABNORMAL LOW (ref 39.0–52.0)
Hemoglobin: 11.4 g/dL — ABNORMAL LOW (ref 13.0–17.0)
Immature Granulocytes: 1 %
Lymphocytes Relative: 8 %
Lymphs Abs: 1.1 10*3/uL (ref 0.7–4.0)
MCH: 29.6 pg (ref 26.0–34.0)
MCHC: 32.5 g/dL (ref 30.0–36.0)
MCV: 91.2 fL (ref 80.0–100.0)
Monocytes Absolute: 1.8 10*3/uL — ABNORMAL HIGH (ref 0.1–1.0)
Monocytes Relative: 13 %
Neutro Abs: 11.2 10*3/uL — ABNORMAL HIGH (ref 1.7–7.7)
Neutrophils Relative %: 77 %
Platelet Count: 152 10*3/uL (ref 150–400)
RBC: 3.85 MIL/uL — ABNORMAL LOW (ref 4.22–5.81)
RDW: 17.2 % — ABNORMAL HIGH (ref 11.5–15.5)
WBC Count: 14.5 10*3/uL — ABNORMAL HIGH (ref 4.0–10.5)
nRBC: 0 % (ref 0.0–0.2)

## 2022-12-12 LAB — CEA (ACCESS): CEA (CHCC): 295.13 ng/mL — ABNORMAL HIGH (ref 0.00–5.00)

## 2022-12-12 LAB — CMP (CANCER CENTER ONLY)
ALT: 34 U/L (ref 0–44)
AST: 58 U/L — ABNORMAL HIGH (ref 15–41)
Albumin: 3.6 g/dL (ref 3.5–5.0)
Alkaline Phosphatase: 202 U/L — ABNORMAL HIGH (ref 38–126)
Anion gap: 6 (ref 5–15)
BUN: 16 mg/dL (ref 8–23)
CO2: 25 mmol/L (ref 22–32)
Calcium: 9.3 mg/dL (ref 8.9–10.3)
Chloride: 103 mmol/L (ref 98–111)
Creatinine: 0.75 mg/dL (ref 0.61–1.24)
GFR, Estimated: >60 mL/min (ref >60)
Glucose, Bld: 99 mg/dL (ref 70–99)
Potassium: 4.4 mmol/L (ref 3.5–5.1)
Sodium: 134 mmol/L — ABNORMAL LOW (ref 135–145)
Total Bilirubin: 1.1 mg/dL (ref 0.3–1.2)
Total Protein: 6.4 g/dL — ABNORMAL LOW (ref 6.5–8.1)

## 2022-12-12 LAB — MAGNESIUM: Magnesium: 1.9 mg/dL (ref 1.7–2.4)

## 2022-12-12 MED ORDER — DIPHENHYDRAMINE HCL 25 MG PO CAPS
25.0000 mg | ORAL_CAPSULE | Freq: Once | ORAL | Status: AC
  Administered 2022-12-12: 25 mg via ORAL
  Filled 2022-12-12: qty 1

## 2022-12-12 MED ORDER — LEUCOVORIN CALCIUM INJECTION 350 MG
200.0000 mg/m2 | Freq: Once | INTRAVENOUS | Status: AC
  Administered 2022-12-12: 374 mg via INTRAVENOUS
  Filled 2022-12-12: qty 18.7

## 2022-12-12 MED ORDER — SODIUM CHLORIDE 0.9 % IV SOLN
10.0000 mg | Freq: Once | INTRAVENOUS | Status: AC
  Administered 2022-12-12: 10 mg via INTRAVENOUS
  Filled 2022-12-12: qty 10

## 2022-12-12 MED ORDER — SODIUM CHLORIDE 0.9 % IV SOLN
1000.0000 mg/m2 | INTRAVENOUS | Status: DC
  Administered 2022-12-12: 2000 mg via INTRAVENOUS
  Filled 2022-12-12: qty 40

## 2022-12-12 MED ORDER — OXALIPLATIN CHEMO INJECTION 100 MG/20ML
50.0000 mg/m2 | Freq: Once | INTRAVENOUS | Status: AC
  Administered 2022-12-12: 100 mg via INTRAVENOUS
  Filled 2022-12-12: qty 20

## 2022-12-12 MED ORDER — FAMOTIDINE IN NACL 20-0.9 MG/50ML-% IV SOLN
20.0000 mg | Freq: Once | INTRAVENOUS | Status: AC
  Administered 2022-12-12: 20 mg via INTRAVENOUS
  Filled 2022-12-12: qty 50

## 2022-12-12 MED ORDER — PALONOSETRON HCL INJECTION 0.25 MG/5ML
0.2500 mg | Freq: Once | INTRAVENOUS | Status: AC
  Administered 2022-12-12: 0.25 mg via INTRAVENOUS
  Filled 2022-12-12: qty 5

## 2022-12-12 MED ORDER — DEXTROSE 5 % IV SOLN: Freq: Once | INTRAVENOUS | Status: AC

## 2022-12-12 NOTE — Progress Notes (Signed)
Luis Interpreter at chairside during lab/flush visit.

## 2022-12-12 NOTE — Progress Notes (Signed)
Patient seen by Lisa Thomas NP today  Vitals are within treatment parameters.  Labs reviewed by Lisa Thomas NP and are within treatment parameters.  Per physician team, patient is ready for treatment and there are NO modifications to the treatment plan.     

## 2022-12-12 NOTE — Patient Instructions (Addendum)
Instrucciones al darle de alta: The chemotherapy medication bag should finish at 46 hours, 96 hours, or 7 days. For example, if your pump is scheduled for 46 hours and it was put on at 4:00 p.m., it should finish at 2:00 p.m. the day it is scheduled to come off regardless of your appointment time.     Estimated time to finish at 1:30 Thursday, December 14, 2022.   If the display on your pump reads "Low Volume" and it is beeping, take the batteries out of the pump and come to the cancer center for it to be taken off.   If the pump alarms go off prior to the pump reading "Low Volume" then call [phone] and someone can assist you.  If the plunger comes out and the chemotherapy medication is leaking out, please use your home chemo spill kit to clean up the spill. Do NOT use paper towels or other household products.  If you have problems or questions regarding your pump, please call either [phone] (24 hours a day) or the cancer center Monday-Friday 8:00 a.m.- 4:30 p.m. at the clinic number and we will assist you. If you are unable to get assistance, then go to the nearest Emergency Department and ask the staff to contact the IV team for assistance.  Discharge Instructions Gracias por elegir al Avala de Cncer de [place] para brindarle atencin mdica de oncologa y Teacher, English as a foreign language.   Si usted tiene una cita de laboratorio con [employer] de [place], por favor vaya directamente al [employer] de Cncer y regstrese en el rea de Engineer, maintenance (IT).   Use ropa cmoda y [place] para tener fcil acceso a las vas del Portacath (acceso venoso de Set designer duracin) o la lnea PICC (catter central colocado por va perifrica).   Nos esforzamos por ofrecerle tiempo de calidad con su proveedor. Es posible que tenga que volver a programar su cita si llega tarde (15 minutos o ms).  El llegar tarde le afecta a usted y a otros pacientes cuyas citas son posteriores a Armed forces operational officer.  Adems, si usted falta a tres o ms citas sin  avisar a la oficina, puede ser retirado(a) de la clnica a discrecin del proveedor.      Para las solicitudes de renovacin de recetas, pida a su farmacia que se ponga en contacto con nuestra oficina y deje que transcurran 72 horas para que se complete el proceso de las renovaciones.    Hoy usted recibi los siguientes agentes de quimioterapia e/o inmunoterapia Oxaliplatin, Leucovorin, Fluorouracil.      Para ayudar a prevenir las nuseas y los vmitos despus de su tratamiento, le recomendamos que tome su medicamento para las nuseas segn las indicaciones.  LOS SNTOMAS QUE DEBEN COMUNICARSE INMEDIATAMENTE SE INDICAN A CONTINUACIN: *FIEBRE SUPERIOR A 100.4 F (38 C) O MS *ESCALOFROS O SUDORACIN *NUSEAS Y VMITOS QUE NO SE CONTROLAN CON EL MEDICAMENTO PARA LAS NUSEAS *DIFICULTAD INUSUAL PARA RESPIRAR  *MORETONES O HEMORRAGIAS NO HABITUALES *PROBLEMAS URINARIOS (dolor o ardor al Geographical information systems officer o frecuencia para Geographical information systems officer) *PROBLEMAS INTESTINALES (diarrea inusual, estreimiento, dolor cerca del ano) SENSIBILIDAD EN LA BOCA Y EN LA GARGANTA CON O SIN LA PRESENCIA DE LCERAS (dolor de garganta, llagas en la boca o dolor de muelas/dientes) ERUPCIN, HINCHAZN O DOLORES INUSUALES FLUJO VAGINAL INUSUAL O PICAZN/RASQUIA    Los puntos marcados con un asterisco ( *) indican una posible emergencia y debe hacer un seguimiento tan pronto como le sea posible o vaya al Departamento de Emergencias si se le presenta  algn problema.  Por favor, muestre la [place] DE ADVERTENCIA DE Marc Morgans DE ADVERTENCIA DE Gardiner Fanti al registrarse en [address] de Emergencias y a la enfermera de triaje.  Si tiene preguntas despus de su visita o necesita cancelar o volver a programar su cita, por favor pngase en contacto con [facility]  Dept: [phone]  y siga las instrucciones. Las horas de oficina son de 8:00 a.m. a 4:30 p.m. de lunes a viernes. Por favor,  tenga en cuenta que los mensajes de voz que se dejan despus de las 4:00 p.m. posiblemente no se devolvern hasta el siguiente da de [place].  Cerramos los fines de semana y [employer]. En todo momento tiene acceso a una enfermera para preguntas urgentes. Por favor, llame al nmero principal de la clnica Dept: [phone] y siga las instrucciones.   Para cualquier pregunta que no sea de carcter urgente, tambin puede ponerse en contacto con su proveedor [employer]. Ahora ofrecemos visitas electrnicas para cualquier persona mayor de 18 aos que solicite atencin mdica en lnea para los sntomas que no sean urgentes. Para ms detalles vaya a [email or web address].   Tambin puede bajar la aplicacin de MyChart! Vaya a la tienda de aplicaciones, busque "MyChart", abra la aplicacin, seleccione [place], e ingrese con su nombre de usuario y la contrasea de Clinical cytogeneticist.  Oxaliplatin Injection Qu es este medicamento? El OXALIPLATINO trata el cncer colorrectal. Acta desacelerando el crecimiento de clulas cancerosas. Este medicamento puede ser utilizado para otros usos; si tiene alguna pregunta consulte con su proveedor de atencin mdica o con su farmacutico. MARCAS COMUNES: Eloxatin Qu le debo informar a mi profesional de la salud antes de tomar este medicamento? Necesitan saber si usted presenta alguno de los siguientes problemas o situaciones: Enfermedad cardiaca Antecedentes de frecuencia o ritmo cardiaco irregular Enfermedad heptica Niveles bajos de clulas sanguneas (glbulos blancos, glbulos rojos y plaquetas) Enfermedad pulmonar o respiratoria, como asma Si [place] medicamentos que tratan o previenen cogulos sanguneos Hormigueo en los dedos de las manos o los pies, u otro trastorno del sistema nervioso Una reaccin alrgica o inusual al oxaliplatino, a otros medicamentos, alimentos, colorantes o conservantes Si usted o su pareja est embarazada o  intentando quedar embarazada Si est amamantando a un beb Cmo debo Visual merchandiser medicamento? Este medicamento se inyecta en una vena. Su equipo de atencin lo [employer] en un hospital o en un entorno clnico. Hable con su equipo de atencin sobre el uso de este medicamento en nios. Puede requerir atencin especial. Sobredosis: Pngase en contacto inmediatamente con un centro toxicolgico o una sala de urgencia si usted cree que haya tomado demasiado medicamento.<br>ATENCIN: [employer] es solo para usted. No comparta este medicamento con nadie. Qu sucede si me olvido de una dosis? Cumpla con las citas para dosis de seguimiento. Es importante no olvidar ninguna dosis. Llame a su equipo de atencin si no puede asistir a una cita. Qu puede interactuar con este medicamento? No use este medicamento con ninguno de los siguientes productos: Cisaprida Dronedarona Pimozida Tioridazina Este medicamento tambin podra interactuar con los siguientes productos: Aspirina y otros medicamentos tipo aspirina Ciertos medicamentos que tratan o previenen cogulos sanguneos, tales como warfarina, apixabn, dabigatrn y rivaroxabn Cisplatino Ciclosporina Diurticos Medicamentos para las infecciones tales como aciclovir, adefovir, anfotericina B, bacitracina, cidofovir, foscarnet, ganciclovir, gentamicina, pentamidina, vancomicina AINE, medicamentos para el dolor y la inflamacin, tales como ibuprofeno o naproxeno Otros medicamentos que causan cambios en el ritmo cardiaco  Pamidronato cido zoledrnico Puede ser que esta lista no menciona todas las posibles interacciones. Informe a su profesional de [employer] de [employer] productos a base de hierbas, medicamentos de [place] o suplementos nutritivos que est tomando. Si usted fuma, consume bebidas alcohlicas o si utiliza drogas ilegales, indqueselo tambin a su profesional de [employer]. Algunas sustancias pueden interactuar con su  medicamento. A qu debo estar atento al usar P[employer] Corporation? Se supervisar su estado de salud atentamente mientras reciba este medicamento. Usted podra necesitar realizarse [employer] de sangre mientras est usando [employer]. Este medicamento podra hacerle sentir un Risk analyst. Esto no es inusual, ya que la quimioterapia puede afectar tanto a las clulas sanas como a las clulas cancerosas. Si presenta algn efecto secundario, infrmelo. Contine con el tratamiento incluso si se siente enfermo, a menos que su equipo de [employer] lo suspenda. Este medicamento puede aumentar su riesgo de contraer una infeccin. Llame para pedir consejo a su equipo de atencin si tiene fiebre, escalofros, dolor de garganta o cualquier otro sntoma de resfriado o gripe. No se trate usted mismo. Trate de no acercarse a personas que estn enfermas. Evite usar medicamentos que contengan aspirina, acetaminofeno, ibuprofeno, naproxeno o ketoprofeno, a menos que as lo indique su equipo de atencin. Estos medicamentos pueden ocultar la fiebre. Proceda con cuidado al cepillar sus dientes, usar hilo dental o Chemical engineer palillos para los dientes, ya que podra contraer una infeccin o Geophysicist/field seismologist con mayor facilidad. Si recibe algn tratamiento dental, informe a su dentista que est [employer]. Este medicamento puede aumentar su sensibilidad al fro. No beba bebidas fras ni use hielo. Cubra la piel expuesta antes de entrar en contacto con temperaturas bajas u objetos fros. Cuando est a la [employer], use ropa [place] y cbrase la boca y la nariz para calentar el aire que ingresa a los pulmones. Informe a su equipo de atencin si se vuelve sensible al fro. Informe a su equipo de atencin si usted o su pareja est embarazada o si cree que alguna de las [place] podra [address]. Este medicamento puede causar defectos congnitos graves si se [place] durante el embarazo y por 9 meses  despus de la ltima dosis. Se requiere una prueba de embarazo con resultado negativo antes de Games developer a Producer, television/film/video. Se recomienda Chemical engineer un mtodo anticonceptivo confiable mientras est usando este medicamento y durante 9 meses despus de la ltima dosis. Hable con su equipo de atencin sobre mtodos anticonceptivos eficaces. No embarace a Careers information officer est usando este medicamento y durante 6 meses despus de la ltima dosis. Use un condn durante las relaciones sexuales que mantenga en [place]. No debe amamantar a un beb mientras [place] este medicamento y por 3 meses despus de la ltima dosis. Este medicamento podra causar infertilidad. Hable con su equipo de atencin si le preocupa su fertilidad. Qu efectos secundarios puedo tener al [employer] este medicamento? Efectos secundarios que debe informar a su equipo de atencin tan pronto como sea posible: Reacciones alrgicas: erupcin cutnea, comezn/picazn, urticaria, hinchazn de la cara, los labios, la lengua o la garganta Sangrado: heces con [place], o de color negro y Geophysicist/field seismologist, vomitar sangre o material marrn que tiene el aspecto de posos (residuos) de caf, [place] de color rojo o marrn oscuro, pequeas manchas rojas o moradas en la piel, sangrado o moretones inusuales Tos seca, falta de aire o problemas para respirar Cambios en el ritmo cardiaco: frecuencia cardiaca rpida  o irregular, mareos, sensacin de [employer] International o aturdimiento, Journalist, newspaper, dificultad para respirar Infeccin: fiebre, escalofros, tos, dolor de garganta, heridas que no sanan, dolor o problemas para Geographical information systems officer, sensacin general de molestia o Media planner en el hgado: dolor en la regin abdominal superior derecha, prdida de apetito, nuseas, heces de color claro, orina amarilla oscura o marrn, color amarillento de los ojos o la piel, debilidad o fatiga inusuales Recuento bajo de glbulos rojos: debilidad o fatiga inusuales, mareo, Engineer, mining  de Training and development officer, dificultad para respirar Lesin muscular: debilidad o fatiga inusuales, dolor muscular, orina amarilla oscura o marrn, disminucin de la cantidad de [place] Dolor, hormigueo o entumecimiento de las manos o los pies Dolor de cabeza repentino e intenso, confusin, cambio en la visin, convulsiones, que podran ser signos de sndrome de encefalopata posterior reversible Sangrado o moretones inusuales Efectos secundarios que generalmente no requieren atencin mdica (debe informarlos a su equipo de atencin si persisten o si son molestos): Diarrea Therapist, sports, enrojecimiento o hinchazn con llagas dentro de la boca o la garganta Debilidad o fatiga inusuales Vmito Puede ser que esta lista no menciona todos los posibles efectos secundarios. Comunquese a su mdico por asesoramiento mdico Hewlett[employer]ard. Usted puede informar los efectos secundarios a la FDA por telfono al 1-800-FDA-1088. Dnde debo guardar mi medicina? Este medicamento se administra en hospitales o clnicas. No se guarda en su casa. <b>ATENCIN: Este folleto es un resumen. Puede ser que no cubra toda la posible informacin. Si usted tiene preguntas acerca de esta medicina, consulte con su mdico, su farmacutico o su profesional de Radiographer, therapeutic.</b>  2024 Elsevier/Gold Standard (2022-05-16 00:00:00) Leucovorin Injection Qu es este medicamento? La [place] previene los efectos secundarios de ciertos medicamentos, Consulting civil engineer. Acta [employer] niveles de folato. Esto ayuda a proteger las clulas sanas del cuerpo. Tambin podra usarse para tratar la anemia causada por niveles bajos de folato. Tambin se puede Chemical engineer con fluorouracilo, un tipo de quimioterapia, para Tax adviser. Acta [employer] del fluorouracilo en el cuerpo. Este medicamento puede ser utilizado para otros usos; si tiene alguna pregunta consulte con su proveedor de atencin mdica o con su  farmacutico. Qu le debo informar a mi profesional de la salud antes de tomar este medicamento? Necesitan saber si usted presenta alguno de los [employer] o situaciones: Anemia por niveles bajos de vitamina B12 en la sangre Una reaccin alrgica o inusual a la leucovorina, al cido flico, a otros medicamentos, alimentos, colorantes o conservantes Si est embarazada o buscando quedar embarazada Si est amamantando a un beb Cmo debo [employer]? Este medicamento se inyecta en una vena o un msculo. Su equipo de atencin lo [employer] en un hospital o en un entorno clnico. Hable con su equipo de atencin sobre el uso de este medicamento en nios. Puede requerir atencin especial. Sobredosis: Pngase en contacto inmediatamente con un centro toxicolgico o una sala de urgencia si usted cree que haya tomado demasiado medicamento.<br>ATENCIN: [employer] American es solo para usted. No comparta este medicamento con nadie. Qu sucede si me olvido de una dosis? Cumpla con las citas para dosis de seguimiento. Es importante no olvidar ninguna dosis. Llame a su equipo de atencin si no puede asistir a una cita. Qu puede interactuar con este medicamento? Capecitabina Fluorouracilo Fenobarbital Fenitona Primidona Trimetoprima;sulfametoxasol Puede ser que esta lista no menciona todas las posibles interacciones. Informe a su profesional de [address] de In[employer] Inc productos a base de hierbas, medicamentos de  venta libre o suplementos nutritivos que est tomando. Si usted fuma, consume bebidas alcohlicas o si utiliza drogas ilegales, indqueselo tambin a su profesional de [employer]. Algunas sustancias pueden interactuar con su medicamento. A qu debo estar atento al usar [employer] Corporation? Se supervisar su estado de salud atentamente mientras reciba este medicamento. Este medicamento podra aumentar los efectos secundarios del 5-fluorouracilo. Informe a su equipo de atencin si  tiene diarrea o llagas en la boca que no mejoran o empeoran. Qu efectos secundarios puedo tener al [employer] este medicamento? Efectos secundarios que debe informar a su equipo de atencin tan pronto como sea posible: Reacciones alrgicas: erupcin cutnea, comezn/picazn, urticaria, hinchazn de la cara, los labios, la lengua o la garganta Puede ser que esta lista no menciona todos los posibles efectos secundarios. Comunquese a su mdico por asesoramiento mdico Hewlett[employer]ard. Usted puede informar los efectos secundarios a la FDA por telfono al 1-800-FDA-1088. Dnde debo guardar mi medicina? Este medicamento se administra en hospitales o clnicas. No se guarda en su casa. <b>ATENCIN: Este folleto es un resumen. Puede ser que no cubra toda la posible informacin. Si usted tiene preguntas acerca de esta medicina, consulte con su mdico, su farmacutico o su profesional de Radiographer, therapeutic.</b>  2024 Elsevier/Gold Standard (2022-03-01 00:00:00)  Fluorouracil Injection What is this medication? FLUOROURACIL (flure oh YOOR a sil) treats some types of cancer. It works by slowing down the growth of cancer cells. This medicine may be used for other purposes; ask your health care provider or pharmacist if you have questions. COMMON BRAND NAME(S): Adrucil What should I tell my care team before I take this medication? They need to know if you have any of these conditions: Blood disorders Dihydropyrimidine dehydrogenase (DPD) deficiency Infection, such as chickenpox, cold sores, herpes Kidney disease Liver disease Poor nutrition Recent or ongoing radiation therapy An unusual or allergic reaction to fluorouracil, other medications, foods, dyes, or preservatives If you or your partner are pregnant or trying to get pregnant Breast-feeding How should I use this medication? This medication is injected into a vein. It is administered by your care team in a hospital or clinic setting. Talk  to your care team about the use of this medication in children. Special care may be needed. Overdosage: If you think you have taken too much of this medicine contact a poison control center or emergency room at once. NOTE: This medicine is only for you. Do not share this medicine with others. What if I miss a dose? Keep appointments for follow-up doses. It is important not to miss your dose. Call your care team if you are unable to keep an appointment. What may interact with this medication? Do not take this medication with any of the following: Live virus vaccines This medication may also interact with the following: Medications that treat or prevent blood clots, such as warfarin, enoxaparin, dalteparin This list may not describe all possible interactions. Give your health care provider a list of all the medicines, herbs, non-prescription drugs, or dietary supplements you use. Also tell them if you smoke, drink alcohol, or use illegal drugs. Some items may interact with your medicine. What should I watch for while using this medication? Your condition will be monitored carefully while you are receiving this medication. This medication may make you feel generally unwell. This is not uncommon as chemotherapy can affect healthy cells as well as cancer cells. Report any side effects. Continue your course of treatment even though you feel ill unless  your care team tells you to stop. In some cases, you may be given additional medications to help with side effects. Follow all directions for their use. This medication may increase your risk of getting an infection. Call your care team for advice if you get a fever, chills, sore throat, or other symptoms of a cold or flu. Do not treat yourself. Try to avoid being around people who are sick. This medication may increase your risk to bruise or bleed. Call your care team if you notice any unusual bleeding. Be careful brushing or flossing your teeth or using a  toothpick because you may get an infection or bleed more easily. If you have any dental work done, tell your dentist you are receiving this medication. Avoid taking medications that contain aspirin, acetaminophen, ibuprofen, naproxen, or ketoprofen unless instructed by your care team. These medications may hide a fever. Do not treat diarrhea with over the counter products. Contact your care team if you have diarrhea that lasts more than 2 days or if it is severe and watery. This medication can make you more sensitive to the sun. Keep out of the sun. If you cannot avoid being in the sun, wear protective clothing and sunscreen. Do not use sun lamps, tanning beds, or tanning booths. Talk to your care team if you or your partner wish to become pregnant or think you might be pregnant. This medication can cause serious birth defects if taken during pregnancy and for 3 months after the last dose. A reliable form of contraception is recommended while taking this medication and for 3 months after the last dose. Talk to your care team about effective forms of contraception. Do not father a child while taking this medication and for 3 months after the last dose. Use a condom while having sex during this time period. Do not breastfeed while taking this medication. This medication may cause infertility. Talk to your care team if you are concerned about your fertility. What side effects may I notice from receiving this medication? Side effects that you should report to your care team as soon as possible: Allergic reactions--skin rash, itching, hives, swelling of the face, lips, tongue, or throat Heart attack--pain or tightness in the chest, shoulders, arms, or jaw, nausea, shortness of breath, cold or clammy skin, feeling faint or lightheaded Heart failure--shortness of breath, swelling of the ankles, feet, or hands, sudden weight gain, unusual weakness or fatigue Heart rhythm changes--fast or irregular heartbeat,  dizziness, feeling faint or lightheaded, chest pain, trouble breathing High ammonia level--unusual weakness or fatigue, confusion, loss of appetite, nausea, vomiting, seizures Infection--fever, chills, cough, sore throat, wounds that don't heal, pain or trouble when passing urine, general feeling of discomfort or being unwell Low red blood cell level--unusual weakness or fatigue, dizziness, headache, trouble breathing Pain, tingling, or numbness in the hands or feet, muscle weakness, change in vision, confusion or trouble speaking, loss of balance or coordination, trouble walking, seizures Redness, swelling, and blistering of the skin over hands and feet Severe or prolonged diarrhea Unusual bruising or bleeding Side effects that usually do not require medical attention (report to your care team if they continue or are bothersome): Dry skin Headache Increased tears Nausea Pain, redness, or swelling with sores inside the mouth or throat Sensitivity to light Vomiting This list may not describe all possible side effects. Call your doctor for medical advice about side effects. You may report side effects to FDA at 1-800-FDA-1088. Where should I keep my medication? This  medication is given in a hospital or clinic. It will not be stored at home. NOTE: This sheet is a summary. It may not cover all possible information. If you have questions about this medicine, talk to your doctor, pharmacist, or health care provider.  2024 Elsevier/Gold Standard (2021-08-16 00:00:00)

## 2022-12-12 NOTE — Progress Notes (Signed)
[facility] OFFICE PROGRESS NOTE   Diagnosis: Colon cancer  INTERVAL HISTORY:   John Vance returns as scheduled.  He completed cycle 1 FOLFOX 11/28/2022.  He had mild nausea around day 10.  No vomiting.  No mouth sores.  He has intermittent loose stools alternating with constipation.  He denies significant cold sensitivity.  Skin rash is better, persistent itchiness.  He has intermittent pain at the right lower abdomen and low back.  He has taken about 5 tramadol tablets over the past 2 weeks with relief.  Appetite decreased following chemotherapy.  He has lost weight.  Objective:  Vital signs in last 24 hours:  Blood pressure (!) 131/58, pulse 74, temperature 98.1 F (36.7 C), temperature source Oral, resp. rate 18, height 5\' 8"  (1.727 m), weight 154 lb 8 oz (70.1 kg), SpO2 100%.    HEENT: No thrush or ulcers. Resp: Lungs clear bilaterally. Cardio: Regular rate and rhythm. GI: No hepatosplenomegaly.  No mass. Vascular: No leg edema. Skin: Resolving rash at the upper chest and forearms. Port-A-Cath without erythema.  Lab Results:  Lab Results  Component Value Date   WBC 14.5 (H) 12/12/2022   HGB 11.4 (L) 12/12/2022   HCT 35.1 (L) 12/12/2022   MCV 91.2 12/12/2022   PLT 152 12/12/2022   NEUTROABS 11.2 (H) 12/12/2022    Imaging:  No results found.  Medications: I have reviewed the patient's current medications.  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), transverse colon cancer stage IIB (T4aN0) status post extended right hemicolectomy 05/07/2019, MSS Tumor invasive superficial portion of the muscle ("internal third "), no tumor perforation, no vascular invasion or perineural invasion, negative resection margins, 0/4 lymph nodes Elevated CEA 02/10/2019 CTs 03/05/2019, compared to outside CTs from 11/22/2018-worsened wall thickening in the cecum  stable apple core lesion in the mid transverse colon, no evidence of recurrent tumor at the distal colonic anastomosis, no evidence of metastatic disease Colonoscopy 03/24/2019 20-2 malignant appearing masses noted in the colon, 1 filled the cecum measuring approximately 4 cm across and friable.  The other malignant appearing mass was circumferential creating a stricture with a 1.2 cm lumen located in the transverse segment approximately 4 to 5 cm in length.  There were 12-18 neoplastic appearing polyps in between the 2 obvious malignant masses ranging in size from 3 mm to 2 cm.  5 polyps distal to the transverse colon mass.  2 sigmoid colon polyps.  Cecum mass positive for adenocarcinoma.  Transverse colon mass positive for adenocarcinoma. Foundation 1 transverse colon-MSS, tumor mutation burden 7, KRAS wild-type, BRAF fusion 05/07/2019 laparoscopic extended right hemicolectomy, lysis of adhesions-5 cm invasive moderately differentiated adenocarcinoma involving cecum and proximal ascending colon, carcinoma invades into the pericolonic soft tissue; 7 cm invasive moderately differentiated carcinoma involving the transverse colon, carcinoma invades into the serosal surface (visceral peritoneum); all resection margins negative for carcinoma.  Lymphovascular invasion present.  In the proximal colon metastatic carcinoma involves 3 of 17 lymph nodes with 1 tumor deposit.  In the transverse colon 12 lymph nodes negative for metastatic carcinoma.  3 separate polyps: Tubular adenoma, inflammatory polyp and hyperplastic polyp Cycle 1 capecitabine 06/02/2019 Cycle 2 capecitabine 06/23/2019 Cycle 3 capecitabine 07/14/2019 Cycle 4 capecitabine 08/04/2019 Cycle 5 capecitabine 08/25/2019 Cycle 6 capecitabine 09/15/2019 Cycle 7 capecitabine 10/06/2019 Cycle 8 capecitabine 10/27/2019 Upper endoscopy 07/23/2020-negative Colonoscopy 07/23/2020-normal-appearing anastomoses, polyp removed from the descending colon-tubular adenoma CTs  01/18/2021-right abdominal  mesenteric mass, mass in the low anatomic pelvis consistent with metastases Cycle 1 FOLFOX 02/21/2021 Cycle 2 FOLFOX 03/07/2021, dose reductions made due to mild neutropenia Cycle 3 FOLFOX 03/21/2021 Cycle 4 FOLFOX 04/04/2021 Cycle 5 FOLFOX 04/19/2021 Cycle 6 FOLFOX 05/02/2021 CTs 05/12/2021-decrease size of nodular right mesenteric mass, complete resolution of soft tissue enhancing nodule in the right inguinal canal, resolution of nodular soft tissue mass posterior to the bladder, enlargement of a left upper lobe nodule and new 4 mm left upper lobe nodule-indeterminate Cycle 7 FOLFOX 05/16/2021 Cycle 8 FOLFOX 05/30/2021, oxaliplatin and 5-FU bolus held due to neuropathy and thrombocytopenia Cycle 9 FOLFOX 06/13/2021, oxaliplatin dose reduced Cycle 10 FOLFOX 06/27/2021 CT 07/07/2021-decrease in right mesenteric mass, decreased size of lobulated left upper lobe nodule, resolution of previous "new "left upper lobe nodule, new area of wall thickening at the distal transverse colon Treatment changed to Xeloda 2 weeks on/1 week off beginning 07/18/2021 Cycle 2 Xeloda beginning 08/09/2021 Cycle 3 Xeloda beginning 08/30/2021 Cycle 4 Xeloda beginning 09/21/2021 Cycle 5 Xeloda beginning 10/12/2021 Cycle 6 Xeloda beginning 11/02/2021 CTs 11/14/2021-right lower quadrant spiculated small bowel mesenteric mass measured 2.8 x 3.7 cm, previously 1.0 x 2.3 cm; adjacent mesenteric haziness and nodularity measuring up to 8 mm superiorly.  New 4 mm nodular lesion posterior segment right upper lobe along the major fissure. Treatment break beginning 11/17/2021 CTs 03/21/2022-multiple new liver metastases, enlarging implant in the right ileocolic mesentery, enlarging peritoneal implants in the low pelvis Cycle 1 FOLFIRI/Avastin 03/29/2022 Cycle 2 FOLFIRI/Avastin 04/11/2022 Cycle 3 FOLFIRI/Avastin 04/26/2022 Cycle 4 FOLFIRI/Avastin 05/10/2022 Cycle 5 FOLFIRI/Avastin 05/24/2022 CT abdomen/pelvis 06/02/2022-liver  metastases are stable and smaller, smaller peritoneal/mesenteric nodules Cycle 6 FOLFIRI/Avastin 06/06/2022 Cycle 7 FOLFIRI/Avastin 06/20/2022 Cycle 8 FOLFIRI/Avastin 07/04/2022 Treatment held 07/18/2022 due to neutropenia Cycle 9 FOLFIRI/Avastin 07/24/2022, Udenyca Cycle 10 FOLFIRI/Avastin 08/08/2022, Udenyca CTs 08/18/2022-slight enlargement of several liver lesions, right mesenteric mass and low pelvic soft tissue nodule are stable Cycle 1 FOLFIRI/Panitumumab 08/29/2022 Cycle 2 FOLFIRI/Panitumumab 09/12/2022 Cycle 3 FOLFIRI/panitumumab 09/26/2022 Cycle 4 FOLFIRI/Panitumumab 10/23/2022, Panitumumab dose reduced Cycle 5 FOLFIRI/panitumumab 11/07/2022 CT 11/20/2022-interval progression with increased size and number of liver metastases; new enlarged right juxta caval lymph node; slight increase in size of nodal mass within the right ileocolic mesentery; slight increase in size of subserosal nodule along the anterior aspect of the proximal rectum Cycle 1 FOLFOX 11/28/2022 Cycle 2 FOLFOX 12/12/2022 2.   Microcytic anemia secondary to #1.    Resolved 3.   Family history of pancreas and colon cancer, negative genetic testing per the [employer] panel, ATM variant of unknown significance 4.   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  Urinary retention 11/01/2022-Foley catheter placed; catheter removed 11/22/2022, reports urinating without difficulty 11/28/2022    Disposition: John Vance appears stable.  He has completed 1 cycle of FOLFOX.  Overall he seems to have tolerated well.  He has developed some anorexia with weight loss.  He will begin nutritional supplements.  I discussed with the Cancer Center pharmacist.  No chemotherapy dose reduction recommended based on current weight loss.  Plan to proceed with cycle 2 FOLFOX today as scheduled.  CBC and chemistry panel reviewed.   Labs adequate to proceed as above.  Magnesium level is in low normal range.  He will decrease oral magnesium from twice daily to once daily.  He will return for follow-up and treatment in 2 weeks.  We  are available to see him sooner if needed.  Lonna Cobb ANP/GNP-BC   12/12/2022  10:59 AM

## 2022-12-13 ENCOUNTER — Inpatient Hospital Stay: Payer: Commercial Managed Care - HMO

## 2022-12-13 DIAGNOSIS — Z95828 Presence of other vascular implants and grafts: Secondary | ICD-10-CM

## 2022-12-13 MED ORDER — SODIUM CHLORIDE 0.9% FLUSH
10.0000 mL | Freq: Once | INTRAVENOUS | Status: AC
  Administered 2022-12-13: 10 mL via INTRAVENOUS

## 2022-12-13 NOTE — Progress Notes (Signed)
Patient reported to cancer center due to chemo pump not working properly.  Patient stated that multiple times throughout the night, his chemo pump started beeping and said "High Pressure" on the screen.  Patient's son called the infusystem number and they walked them through different steps to see if the line was kinked.  The pump continued to beep and show the high pressure error.  Patient was then advised to come to the cancer center to have his pump inspected.  Patient's port was flushed and blood return was noted. All connections were checked and were open.  Pump was restarted and still showed high pressure error.  Pump was exchanged for a new pump and there were no longer any high pressure alarms.  Patient was monitored for a few minutes to ensure pump issue had been resolved.  Patient's pump stop time was adjusted to allow for the time the pump was not infusing, and patient was informed of the updated time.  Patient was instructed to contact office if he had any additional issues with his chemo pump. Patient verbalized understanding.

## 2022-12-14 ENCOUNTER — Inpatient Hospital Stay: Payer: Commercial Managed Care - HMO

## 2022-12-14 VITALS — BP 127/59 | HR 87 | Temp 98.1°F | Resp 18

## 2022-12-14 DIAGNOSIS — Z5111 Encounter for antineoplastic chemotherapy: Secondary | ICD-10-CM | POA: Diagnosis not present

## 2022-12-14 DIAGNOSIS — C189 Malignant neoplasm of colon, unspecified: Secondary | ICD-10-CM

## 2022-12-14 MED ORDER — SODIUM CHLORIDE 0.9% FLUSH
10.0000 mL | INTRAVENOUS | Status: DC | PRN
  Administered 2022-12-14: 10 mL

## 2022-12-14 MED ORDER — HEPARIN SOD (PORK) LOCK FLUSH 100 UNIT/ML IV SOLN
500.0000 [IU] | Freq: Once | INTRAVENOUS | Status: AC | PRN
  Administered 2022-12-14: 500 [IU]

## 2022-12-14 NOTE — Progress Notes (Signed)
Verdie Shire, Interpreter from [facility] at chairside with patient for visit.

## 2022-12-14 NOTE — Patient Instructions (Signed)

## 2022-12-24 ENCOUNTER — Other Ambulatory Visit: Payer: Self-pay | Admitting: Oncology

## 2022-12-24 DIAGNOSIS — C189 Malignant neoplasm of colon, unspecified: Secondary | ICD-10-CM

## 2022-12-26 ENCOUNTER — Inpatient Hospital Stay: Payer: Commercial Managed Care - HMO

## 2022-12-26 ENCOUNTER — Encounter: Payer: Self-pay | Admitting: *Deleted

## 2022-12-26 ENCOUNTER — Inpatient Hospital Stay: Payer: Commercial Managed Care - HMO | Admitting: Licensed Clinical Social Worker

## 2022-12-26 ENCOUNTER — Encounter: Payer: Self-pay | Admitting: Oncology

## 2022-12-26 ENCOUNTER — Inpatient Hospital Stay (HOSPITAL_BASED_OUTPATIENT_CLINIC_OR_DEPARTMENT_OTHER): Payer: Commercial Managed Care - HMO | Admitting: Oncology

## 2022-12-26 ENCOUNTER — Inpatient Hospital Stay: Payer: Commercial Managed Care - HMO | Attending: Oncology

## 2022-12-26 DIAGNOSIS — C187 Malignant neoplasm of sigmoid colon: Secondary | ICD-10-CM

## 2022-12-26 DIAGNOSIS — Z5111 Encounter for antineoplastic chemotherapy: Secondary | ICD-10-CM | POA: Insufficient documentation

## 2022-12-26 DIAGNOSIS — G620 Drug-induced polyneuropathy: Secondary | ICD-10-CM | POA: Diagnosis not present

## 2022-12-26 DIAGNOSIS — C180 Malignant neoplasm of cecum: Secondary | ICD-10-CM | POA: Insufficient documentation

## 2022-12-26 DIAGNOSIS — C189 Malignant neoplasm of colon, unspecified: Secondary | ICD-10-CM

## 2022-12-26 DIAGNOSIS — D509 Iron deficiency anemia, unspecified: Secondary | ICD-10-CM | POA: Insufficient documentation

## 2022-12-26 DIAGNOSIS — C787 Secondary malignant neoplasm of liver and intrahepatic bile duct: Secondary | ICD-10-CM | POA: Diagnosis not present

## 2022-12-26 DIAGNOSIS — Z800 Family history of malignant neoplasm of digestive organs: Secondary | ICD-10-CM | POA: Diagnosis not present

## 2022-12-26 DIAGNOSIS — C182 Malignant neoplasm of ascending colon: Secondary | ICD-10-CM | POA: Insufficient documentation

## 2022-12-26 LAB — CBC WITH DIFFERENTIAL (CANCER CENTER ONLY)
Abs Immature Granulocytes: 0.1 10*3/uL — ABNORMAL HIGH (ref 0.00–0.07)
Basophils Absolute: 0 10*3/uL (ref 0.0–0.1)
Basophils Relative: 0 %
Eosinophils Absolute: 0.2 10*3/uL (ref 0.0–0.5)
Eosinophils Relative: 2 %
HCT: 36.6 % — ABNORMAL LOW (ref 39.0–52.0)
Hemoglobin: 11.7 g/dL — ABNORMAL LOW (ref 13.0–17.0)
Immature Granulocytes: 1 %
Lymphocytes Relative: 7 %
Lymphs Abs: 0.9 10*3/uL (ref 0.7–4.0)
MCH: 28.8 pg (ref 26.0–34.0)
MCHC: 32 g/dL (ref 30.0–36.0)
MCV: 90.1 fL (ref 80.0–100.0)
Monocytes Absolute: 1.2 10*3/uL — ABNORMAL HIGH (ref 0.1–1.0)
Monocytes Relative: 10 %
Neutro Abs: 9.9 10*3/uL — ABNORMAL HIGH (ref 1.7–7.7)
Neutrophils Relative %: 80 %
Platelet Count: 159 10*3/uL (ref 150–400)
RBC: 4.06 MIL/uL — ABNORMAL LOW (ref 4.22–5.81)
RDW: 16.8 % — ABNORMAL HIGH (ref 11.5–15.5)
WBC Count: 12.4 10*3/uL — ABNORMAL HIGH (ref 4.0–10.5)
nRBC: 0 % (ref 0.0–0.2)

## 2022-12-26 LAB — CEA (ACCESS): CEA (CHCC): 542.5 ng/mL — ABNORMAL HIGH (ref 0.00–5.00)

## 2022-12-26 LAB — CMP (CANCER CENTER ONLY)
ALT: 37 U/L (ref 0–44)
AST: 72 U/L — ABNORMAL HIGH (ref 15–41)
Albumin: 3.5 g/dL (ref 3.5–5.0)
Alkaline Phosphatase: 222 U/L — ABNORMAL HIGH (ref 38–126)
Anion gap: 6 (ref 5–15)
BUN: 17 mg/dL (ref 8–23)
CO2: 24 mmol/L (ref 22–32)
Calcium: 9 mg/dL (ref 8.9–10.3)
Chloride: 104 mmol/L (ref 98–111)
Creatinine: 0.79 mg/dL (ref 0.61–1.24)
GFR, Estimated: >60 mL/min (ref >60)
Glucose, Bld: 103 mg/dL — ABNORMAL HIGH (ref 70–99)
Potassium: 3.9 mmol/L (ref 3.5–5.1)
Sodium: 134 mmol/L — ABNORMAL LOW (ref 135–145)
Total Bilirubin: 1.6 mg/dL — ABNORMAL HIGH (ref 0.3–1.2)
Total Protein: 6.9 g/dL (ref 6.5–8.1)

## 2022-12-26 LAB — MAGNESIUM: Magnesium: 1.8 mg/dL (ref 1.7–2.4)

## 2022-12-26 MED ORDER — DEXTROSE 5 % IV SOLN: Freq: Once | INTRAVENOUS | Status: AC

## 2022-12-26 MED ORDER — OXALIPLATIN CHEMO INJECTION 100 MG/20ML
50.0000 mg/m2 | Freq: Once | INTRAVENOUS | Status: AC
  Administered 2022-12-26: 100 mg via INTRAVENOUS
  Filled 2022-12-26: qty 20

## 2022-12-26 MED ORDER — FAMOTIDINE IN NACL 20-0.9 MG/50ML-% IV SOLN
20.0000 mg | Freq: Once | INTRAVENOUS | Status: AC
  Administered 2022-12-26: 20 mg via INTRAVENOUS
  Filled 2022-12-26: qty 50

## 2022-12-26 MED ORDER — DIPHENHYDRAMINE HCL 25 MG PO CAPS
25.0000 mg | ORAL_CAPSULE | Freq: Once | ORAL | Status: AC
  Administered 2022-12-26: 25 mg via ORAL
  Filled 2022-12-26: qty 1

## 2022-12-26 MED ORDER — SODIUM CHLORIDE 0.9 % IV SOLN
10.0000 mg | Freq: Once | INTRAVENOUS | Status: AC
  Administered 2022-12-26: 10 mg via INTRAVENOUS
  Filled 2022-12-26: qty 10

## 2022-12-26 MED ORDER — PALONOSETRON HCL INJECTION 0.25 MG/5ML
0.2500 mg | Freq: Once | INTRAVENOUS | Status: AC
  Administered 2022-12-26: 0.25 mg via INTRAVENOUS
  Filled 2022-12-26: qty 5

## 2022-12-26 MED ORDER — LEUCOVORIN CALCIUM INJECTION 350 MG
200.0000 mg/m2 | Freq: Once | INTRAVENOUS | Status: AC
  Administered 2022-12-26: 362 mg via INTRAVENOUS
  Filled 2022-12-26: qty 18.1

## 2022-12-26 MED ORDER — SODIUM CHLORIDE 0.9 % IV SOLN
1000.0000 mg/m2 | INTRAVENOUS | Status: DC
  Administered 2022-12-26: 2000 mg via INTRAVENOUS
  Filled 2022-12-26: qty 40

## 2022-12-26 MED ORDER — TRAMADOL HCL 50 MG PO TABS: 25.0000 mg | ORAL_TABLET | Freq: Four times a day (QID) | ORAL | 0 refills | Status: DC | PRN

## 2022-12-26 NOTE — Progress Notes (Signed)
[facility] OFFICE PROGRESS NOTE   Diagnosis: Colon cancer  INTERVAL HISTORY:   [name] completed a cycle of FOLFOX on 12/12/2022.  He reports developing malaise on approximately day 10.  No mouth sores or diarrhea.  He has increased numbness in the fingers.  This makes it difficult to button his shirt.  No numbness in the feet.  He has pain in the right upper abdomen and right flank areas.  The pain is relieved with tramadol.  He feels drowsy this morning after taking tramadol last night.  Objective:  Vital signs in last 24 hours:  Blood pressure (!) 110/51, pulse 82, temperature (!) 96.9 F (36.1 C), temperature source Tympanic, resp. rate 18, weight 150 lb 11.2 oz (68.4 kg), SpO2 99%.    HEENT: No thrush or ulcers Resp: Lungs clear bilaterally Cardio: Regular rate and rhythm GI: The liver edge is palpable in the right upper abdomen, the right upper abdomen is distended.  Mild tenderness at the right abdomen and right flank. Vascular: No leg edema Neuro: Moderate loss of vibratory sense at the fingertips bilaterally Skin: Dryness of the hands  Portacath/PICC-without erythema  Lab Results:  Lab Results  Component Value Date   WBC 12.4 (H) 12/26/2022   HGB 11.7 (L) 12/26/2022   HCT 36.6 (L) 12/26/2022   MCV 90.1 12/26/2022   PLT 159 12/26/2022   NEUTROABS 9.9 (H) 12/26/2022    CMP  Lab Results  Component Value Date   NA 134 (L) 12/26/2022   K 3.9 12/26/2022   CL 104 12/26/2022   CO2 24 12/26/2022   GLUCOSE 103 (H) 12/26/2022   BUN 17 12/26/2022   CREATININE 0.79 12/26/2022   CALCIUM 9.0 12/26/2022   PROT 6.9 12/26/2022   ALBUMIN 3.5 12/26/2022   AST 72 (H) 12/26/2022   ALT 37 12/26/2022   ALKPHOS 222 (H) 12/26/2022   BILITOT 1.6 (H) 12/26/2022   GFRNONAA >60 12/26/2022   GFRAA >60 12/01/2019    Lab Results  Component Value Date   CEA1 12.10 (H) 01/03/2021   CEA 295.13 (H) 12/12/2022     Medications: I have reviewed the  patient's current medications.   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), transverse colon cancer stage IIB (T4aN0) status post extended right hemicolectomy 05/07/2019, MSS Tumor invasive superficial portion of the muscle ("internal third "), no tumor perforation, no vascular invasion or perineural invasion, negative resection margins, 0/4 lymph nodes Elevated CEA 02/10/2019 CTs 03/05/2019, compared to outside CTs from 11/22/2018-worsened wall thickening in the cecum stable apple core lesion in the mid transverse colon, no evidence of recurrent tumor at the distal colonic anastomosis, no evidence of metastatic disease Colonoscopy 03/24/2019 20-2 malignant appearing masses noted in the colon, 1 filled the cecum measuring approximately 4 cm across and friable.  The other malignant appearing mass was circumferential creating a stricture with a 1.2 cm lumen located in the transverse segment approximately 4 to 5 cm in length.  There were 12-18 neoplastic appearing polyps in between the 2 obvious malignant masses ranging in size from 3 mm to 2 cm.  5 polyps distal to the transverse colon mass.  2 sigmoid colon polyps.  Cecum mass positive for adenocarcinoma.  Transverse colon mass positive for adenocarcinoma. Foundation 1 transverse colon-MSS, tumor mutation burden 7, KRAS wild-type, BRAF fusion 05/07/2019 laparoscopic extended right hemicolectomy, lysis of adhesions-5 cm invasive moderately differentiated adenocarcinoma involving cecum  and proximal ascending colon, carcinoma invades into the pericolonic soft tissue; 7 cm invasive moderately differentiated carcinoma involving the transverse colon, carcinoma invades into the serosal surface (visceral peritoneum); all resection margins negative for carcinoma.  Lymphovascular invasion present.  In the proximal colon metastatic carcinoma involves 3 of  17 lymph nodes with 1 tumor deposit.  In the transverse colon 12 lymph nodes negative for metastatic carcinoma.  3 separate polyps: Tubular adenoma, inflammatory polyp and hyperplastic polyp Cycle 1 capecitabine 06/02/2019 Cycle 2 capecitabine 06/23/2019 Cycle 3 capecitabine 07/14/2019 Cycle 4 capecitabine 08/04/2019 Cycle 5 capecitabine 08/25/2019 Cycle 6 capecitabine 09/15/2019 Cycle 7 capecitabine 10/06/2019 Cycle 8 capecitabine 10/27/2019 Upper endoscopy 07/23/2020-negative Colonoscopy 07/23/2020-normal-appearing anastomoses, polyp removed from the descending colon-tubular adenoma CTs 01/18/2021-right abdominal mesenteric mass, mass in the low anatomic pelvis consistent with metastases Cycle 1 FOLFOX 02/21/2021 Cycle 2 FOLFOX 03/07/2021, dose reductions made due to mild neutropenia Cycle 3 FOLFOX 03/21/2021 Cycle 4 FOLFOX 04/04/2021 Cycle 5 FOLFOX 04/19/2021 Cycle 6 FOLFOX 05/02/2021 CTs 05/12/2021-decrease size of nodular right mesenteric mass, complete resolution of soft tissue enhancing nodule in the right inguinal canal, resolution of nodular soft tissue mass posterior to the bladder, enlargement of a left upper lobe nodule and new 4 mm left upper lobe nodule-indeterminate Cycle 7 FOLFOX 05/16/2021 Cycle 8 FOLFOX 05/30/2021, oxaliplatin and 5-FU bolus held due to neuropathy and thrombocytopenia Cycle 9 FOLFOX 06/13/2021, oxaliplatin dose reduced Cycle 10 FOLFOX 06/27/2021 CT 07/07/2021-decrease in right mesenteric mass, decreased size of lobulated left upper lobe nodule, resolution of previous "new "left upper lobe nodule, new area of wall thickening at the distal transverse colon Treatment changed to Xeloda 2 weeks on/1 week off beginning 07/18/2021 Cycle 2 Xeloda beginning 08/09/2021 Cycle 3 Xeloda beginning 08/30/2021 Cycle 4 Xeloda beginning 09/21/2021 Cycle 5 Xeloda beginning 10/12/2021 Cycle 6 Xeloda beginning 11/02/2021 CTs 11/14/2021-right lower quadrant spiculated small bowel mesenteric mass measured 2.8  x 3.7 cm, previously 1.0 x 2.3 cm; adjacent mesenteric haziness and nodularity measuring up to 8 mm superiorly.  New 4 mm nodular lesion posterior segment right upper lobe along the major fissure. Treatment break beginning 11/17/2021 CTs 03/21/2022-multiple new liver metastases, enlarging implant in the right ileocolic mesentery, enlarging peritoneal implants in the low pelvis Cycle 1 FOLFIRI/Avastin 03/29/2022 Cycle 2 FOLFIRI/Avastin 04/11/2022 Cycle 3 FOLFIRI/Avastin 04/26/2022 Cycle 4 FOLFIRI/Avastin 05/10/2022 Cycle 5 FOLFIRI/Avastin 05/24/2022 CT abdomen/pelvis 06/02/2022-liver metastases are stable and smaller, smaller peritoneal/mesenteric nodules Cycle 6 FOLFIRI/Avastin 06/06/2022 Cycle 7 FOLFIRI/Avastin 06/20/2022 Cycle 8 FOLFIRI/Avastin 07/04/2022 Treatment held 07/18/2022 due to neutropenia Cycle 9 FOLFIRI/Avastin 07/24/2022, Udenyca Cycle 10 FOLFIRI/Avastin 08/08/2022, Udenyca CTs 08/18/2022-slight enlargement of several liver lesions, right mesenteric mass and low pelvic soft tissue nodule are stable Cycle 1 FOLFIRI/Panitumumab 08/29/2022 Cycle 2 FOLFIRI/Panitumumab 09/12/2022 Cycle 3 FOLFIRI/panitumumab 09/26/2022 Cycle 4 FOLFIRI/Panitumumab 10/23/2022, Panitumumab dose reduced Cycle 5 FOLFIRI/panitumumab 11/07/2022 CT 11/20/2022-interval progression with increased size and number of liver metastases; new enlarged right juxta caval lymph node; slight increase in size of nodal mass within the right ileocolic mesentery; slight increase in size of subserosal nodule along the anterior aspect of the proximal rectum Cycle 1 FOLFOX 11/28/2022 Cycle 2 FOLFOX 12/12/2022 3 FOLFOX 12/26/2022 2.   Microcytic anemia secondary to #1.    Resolved 3.   Family history of pancreas and colon cancer, negative genetic testing per the [facility] panel, ATM variant of unknown significance 4.   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  Urinary retention 11/01/2022-Foley catheter placed; catheter removed 11/22/2022, reports urinating without difficulty 11/28/2022     Disposition: [name] has metastatic colon cancer.  He has completed 2 cycles of salvage chemotherapy with FOLFOX.  He has developed mild symptoms oxaliplatin neuropathy.  Will monitor this closely.  He will complete cycle 4 FOLFOX today.  He will continue tramadol as needed for pain.  He will use 1/2-1 tablet of tramadol as needed.  He will call if this does not relieve the pain.  He will return for an office visit and chemotherapy in 2 weeks we will follow-up on the CEA from today.  We will plan for a restaging CT after 4 cycles.  Thornton Papas, MD  12/26/2022  9:00 AM

## 2022-12-26 NOTE — Progress Notes (Signed)
Patient seen by Dr. Truett Perna today  Vitals are within treatment parameters.  Labs reviewed by Dr. Truett Perna and are within treatment parameters.  Per physician team, patient is ready for treatment. Please note that modifications are being made to the treatment plan including Dose reduced according to weight loss--appears that only drug that decreased in dose is leucovorin (others rounded to prior dose).

## 2022-12-26 NOTE — Progress Notes (Signed)
[facility] Clinical Social Work  Clinical Social Work was referred by self for assessment of psychosocial needs.  Clinical Social Worker met with patient to offer support and assess for needs.    [place], the [facility] interpreter, was also present.  Patient displayed a bright affect.  He reported that his family is very supportive and provides him with everything he needs.     Dorothey Baseman, LCSW  Clinical Social Worker [facility]

## 2022-12-26 NOTE — Patient Instructions (Addendum)
Instrucciones al darle de alta: The chemotherapy medication bag should finish at 46 hours, 96 hours, or 7 days. For example, if your pump is scheduled for 46 hours and it was put on at 4:00 p.m., it should finish at 2:00 p.m. the day it is scheduled to come off regardless of your appointment time.     Estimated time to finish at 11:00 Thursday, December 28 2022.   If the display on your pump reads "Low Volume" and it is beeping, take the batteries out of the pump and come to the cancer center for it to be taken off.   If the pump alarms go off prior to the pump reading "Low Volume" then call [phone] and someone can assist you.  If the plunger comes out and the chemotherapy medication is leaking out, please use your home chemo spill kit to clean up the spill. Do NOT use paper towels or other household products.  If you have problems or questions regarding your pump, please call either [phone] (24 hours a day) or the cancer center Monday-Friday 8:00 a.m.- 4:30 p.m. at the clinic number and we will assist you. If you are unable to get assistance, then go to the nearest Emergency Department and ask the staff to contact the IV team for assistance.  Discharge Instructions Gracias por elegir al [facility] de Cncer de [place] para brindarle atencin mdica de oncologa y Teacher, English as a foreign language.   Si usted tiene una cita de laboratorio con [employer] de [place], por favor vaya directamente al [employer] de Cncer y regstrese en el rea de Engineer, maintenance (IT).   Use ropa cmoda y [place] para tener fcil acceso a las vas del Portacath (acceso venoso de Set designer duracin) o la lnea PICC (catter central colocado por va perifrica).   Nos esforzamos por ofrecerle tiempo de calidad con su proveedor. Es posible que tenga que volver a programar su cita si llega tarde (15 minutos o ms).  El llegar tarde le afecta a usted y a otros pacientes cuyas citas son posteriores a Armed forces operational officer.  Adems, si usted falta a tres o ms citas  sin avisar a la oficina, puede ser retirado(a) de la clnica a discrecin del proveedor.      Para las solicitudes de renovacin de recetas, pida a su farmacia que se ponga en contacto con nuestra oficina y deje que transcurran 72 horas para que se complete el proceso de las renovaciones.    Hoy usted recibi los siguientes agentes de quimioterapia e/o inmunoterapia Oxaliplatin, Leucovorin, Fluorouracil.      Para ayudar a prevenir las nuseas y los vmitos despus de su tratamiento, le recomendamos que tome su medicamento para las nuseas segn las indicaciones.  LOS SNTOMAS QUE DEBEN COMUNICARSE INMEDIATAMENTE SE INDICAN A CONTINUACIN: *FIEBRE SUPERIOR A 100.4 F (38 C) O MS *ESCALOFROS O SUDORACIN *NUSEAS Y VMITOS QUE NO SE CONTROLAN CON EL MEDICAMENTO PARA LAS NUSEAS *DIFICULTAD INUSUAL PARA RESPIRAR  *MORETONES O HEMORRAGIAS NO HABITUALES *PROBLEMAS URINARIOS (dolor o ardor al Geographical information systems officer o frecuencia para Geographical information systems officer) *PROBLEMAS INTESTINALES (diarrea inusual, estreimiento, dolor cerca del ano) SENSIBILIDAD EN LA BOCA Y EN LA GARGANTA CON O SIN LA PRESENCIA DE LCERAS (dolor de garganta, llagas en la boca o dolor de muelas/dientes) ERUPCIN, HINCHAZN O DOLORES INUSUALES FLUJO VAGINAL INUSUAL O PICAZN/RASQUIA    Los puntos marcados con un asterisco ( *) indican una posible emergencia y debe hacer un seguimiento tan pronto como le sea posible o vaya al Departamento de Emergencias si se le presenta  algn problema.  Por favor, muestre la [place] DE ADVERTENCIA DE Marc Morgans DE ADVERTENCIA DE Gardiner Fanti al registrarse en [address] de Emergencias y a la enfermera de triaje.  Si tiene preguntas despus de su visita o necesita cancelar o volver a programar su cita, por favor pngase en contacto con [facility]  Dept: [phone]  y siga las instrucciones. Las horas de oficina son de 8:00 a.m. a 4:30 p.m. de lunes a viernes. Por  favor, tenga en cuenta que los mensajes de voz que se dejan despus de las 4:00 p.m. posiblemente no se devolvern hasta el siguiente da de [place].  Cerramos los fines de semana y [employer]. En todo momento tiene acceso a una enfermera para preguntas urgentes. Por favor, llame al nmero principal de la clnica Dept: [phone] y siga las instrucciones.   Para cualquier pregunta que no sea de carcter urgente, tambin puede ponerse en contacto con su proveedor [employer] and [employer]. Ahora ofrecemos visitas electrnicas para cualquier persona mayor de 18 aos que solicite atencin mdica en lnea para los sntomas que no sean urgentes. Para ms detalles vaya a [email or web address].   Tambin puede bajar la aplicacin de MyChart! Vaya a la tienda de aplicaciones, busque "MyChart", abra la aplicacin, seleccione [place], e ingrese con su nombre de usuario y la contrasea de Clinical cytogeneticist.  Oxaliplatin Injection Qu es este medicamento? El OXALIPLATINO trata el cncer colorrectal. Acta desacelerando el crecimiento de clulas cancerosas. Este medicamento puede ser utilizado para otros usos; si tiene alguna pregunta consulte con su proveedor de atencin mdica o con su farmacutico. MARCAS COMUNES: Eloxatin Qu le debo informar a mi profesional de la salud antes de tomar este medicamento? Necesitan saber si usted presenta alguno de los siguientes problemas o situaciones: Enfermedad cardiaca Antecedentes de frecuencia o ritmo cardiaco irregular Enfermedad heptica Niveles bajos de clulas sanguneas (glbulos blancos, glbulos rojos y plaquetas) Enfermedad pulmonar o respiratoria, como asma Si [place] medicamentos que tratan o previenen cogulos sanguneos Hormigueo en los dedos de las manos o los pies, u otro trastorno del sistema nervioso Una reaccin alrgica o inusual al oxaliplatino, a otros medicamentos, alimentos, colorantes o conservantes Si usted o su pareja est  embarazada o intentando quedar embarazada Si est amamantando a un beb Cmo debo Visual merchandiser medicamento? Este medicamento se inyecta en una vena. Su equipo de atencin lo [employer] en un hospital o en un entorno clnico. Hable con su equipo de atencin sobre el uso de este medicamento en nios. Puede requerir atencin especial. Sobredosis: Pngase en contacto inmediatamente con un centro toxicolgico o una sala de urgencia si usted cree que haya tomado demasiado medicamento.<br>ATENCIN: [employer] es solo para usted. No comparta este medicamento con nadie. Qu sucede si me olvido de una dosis? Cumpla con las citas para dosis de seguimiento. Es importante no olvidar ninguna dosis. Llame a su equipo de atencin si no puede asistir a una cita. Qu puede interactuar con este medicamento? No use este medicamento con ninguno de los siguientes productos: Cisaprida Dronedarona Pimozida Tioridazina Este medicamento tambin podra interactuar con los siguientes productos: Aspirina y otros medicamentos tipo aspirina Ciertos medicamentos que tratan o previenen cogulos sanguneos, tales como warfarina, apixabn, dabigatrn y rivaroxabn Cisplatino Ciclosporina Diurticos Medicamentos para las infecciones tales como aciclovir, adefovir, anfotericina B, bacitracina, cidofovir, foscarnet, ganciclovir, gentamicina, pentamidina, vancomicina AINE, medicamentos para el dolor y la inflamacin, tales como ibuprofeno o naproxeno Otros medicamentos que causan cambios en el ritmo cardiaco  Pamidronato cido zoledrnico Puede ser que esta lista no menciona todas las posibles interacciones. Informe a su profesional de [employer] de [employer] productos a base de hierbas, medicamentos de [place] o suplementos nutritivos que est tomando. Si usted fuma, consume bebidas alcohlicas o si utiliza drogas ilegales, indqueselo tambin a su profesional de [employer]. Algunas sustancias pueden interactuar con su  medicamento. A qu debo estar atento al usar [employer]? Se supervisar su estado de salud atentamente mientras reciba este medicamento. Usted podra necesitar realizarse [employer] de sangre mientras est usando [place]. Este medicamento podra hacerle sentir un Risk analyst. Esto no es inusual, ya que la quimioterapia puede afectar tanto a las clulas sanas como a las clulas cancerosas. Si presenta algn efecto secundario, infrmelo. Contine con el tratamiento incluso si se siente enfermo, a menos que su equipo de [employer] lo suspenda. Este medicamento puede aumentar su riesgo de contraer una infeccin. Llame para pedir consejo a su equipo de atencin si tiene fiebre, escalofros, dolor de garganta o cualquier otro sntoma de resfriado o gripe. No se trate usted mismo. Trate de no acercarse a personas que estn enfermas. Evite usar medicamentos que contengan aspirina, acetaminofeno, ibuprofeno, naproxeno o ketoprofeno, a menos que as lo indique su equipo de atencin. Estos medicamentos pueden ocultar la fiebre. Proceda con cuidado al cepillar sus dientes, usar hilo dental o Chemical engineer palillos para los dientes, ya que podra contraer una infeccin o Geophysicist/field seismologist con mayor facilidad. Si recibe algn tratamiento dental, informe a su dentista que est [employer]. Este medicamento puede aumentar su sensibilidad al fro. No beba bebidas fras ni use hielo. Cubra la piel expuesta antes de entrar en contacto con temperaturas bajas u objetos fros. Cuando est a la [employer], use ropa [place] y cbrase la boca y la nariz para calentar el aire que ingresa a los pulmones. Informe a su equipo de atencin si se vuelve sensible al fro. Informe a su equipo de atencin si usted o su pareja est embarazada o si cree que alguna de las [place] podra [address]. Este medicamento puede causar defectos congnitos graves si se [place] durante el embarazo y por 9 meses  despus de la ltima dosis. Se requiere una prueba de embarazo con resultado negativo antes de Games developer a Producer, television/film/video. Se recomienda Chemical engineer un mtodo anticonceptivo confiable mientras est usando este medicamento y durante 9 meses despus de la ltima dosis. Hable con su equipo de atencin sobre mtodos anticonceptivos eficaces. No embarace a Careers information officer est usando este medicamento y durante 6 meses despus de la ltima dosis. Use un condn durante las relaciones sexuales que mantenga en [place]. No debe amamantar a un beb mientras [place] este medicamento y por 3 meses despus de la ltima dosis. Este medicamento podra causar infertilidad. Hable con su equipo de atencin si le preocupa su fertilidad. Qu efectos secundarios puedo tener al [employer] este medicamento? Efectos secundarios que debe informar a su equipo de atencin tan pronto como sea posible: Reacciones alrgicas: erupcin cutnea, comezn/picazn, urticaria, hinchazn de la cara, los labios, la lengua o la garganta Sangrado: heces con [place], o de color negro y Geophysicist/field seismologist, vomitar sangre o material marrn que tiene el aspecto de posos (residuos) de caf, [place] de color rojo o marrn oscuro, pequeas manchas rojas o moradas en la piel, sangrado o moretones inusuales Tos seca, falta de aire o problemas para respirar Cambios en el ritmo cardiaco: frecuencia cardiaca rpida  o irregular, mareos, sensacin de [employer] o aturdimiento, Journalist, newspaper, dificultad para respirar Infeccin: fiebre, escalofros, tos, dolor de garganta, heridas que no sanan, dolor o problemas para Geographical information systems officer, sensacin general de molestia o Media planner en el hgado: dolor en la regin abdominal superior derecha, prdida de apetito, nuseas, heces de color claro, orina amarilla oscura o marrn, color amarillento de los ojos o la piel, debilidad o fatiga inusuales Recuento bajo de glbulos rojos: debilidad o fatiga inusuales, mareo, Engineer, mining  de Training and development officer, dificultad para respirar Lesin muscular: debilidad o fatiga inusuales, dolor muscular, orina amarilla oscura o marrn, disminucin de la cantidad de [place] Dolor, hormigueo o entumecimiento de las manos o los pies Dolor de cabeza repentino e intenso, confusin, cambio en la visin, convulsiones, que podran ser signos de sndrome de encefalopata posterior reversible Sangrado o moretones inusuales Efectos secundarios que generalmente no requieren atencin mdica (debe informarlos a su equipo de atencin si persisten o si son molestos): Diarrea Therapist, sports, enrojecimiento o hinchazn con llagas dentro de la boca o la garganta Debilidad o fatiga inusuales Vmito Puede ser que esta lista no menciona todos los posibles efectos secundarios. Comunquese a su mdico por asesoramiento mdico [employer]lett[employer]ard. Usted puede informar los efectos secundarios a la FDA por telfono al 1-800-FDA-1088. Dnde debo guardar mi medicina? Este medicamento se administra en hospitales o clnicas. No se guarda en su casa. <b>ATENCIN: Este folleto es un resumen. Puede ser que no cubra toda la posible informacin. Si usted tiene preguntas acerca de esta medicina, consulte con su mdico, su farmacutico o su profesional de Radiographer, therapeutic.</b>  2024 Elsevier/Gold Standard (2022-05-16 00:00:00)  Leucovorin Injection Qu es este medicamento? La [place] previene los efectos secundarios de ciertos medicamentos, Consulting civil engineer. Acta [employer] niveles de folato. Esto ayuda a proteger las clulas sanas del cuerpo. Tambin podra usarse para tratar la anemia causada por niveles bajos de folato. Tambin se puede Chemical engineer con fluorouracilo, un tipo de quimioterapia, para Tax adviser. Acta [employer] del fluorouracilo en el cuerpo. Este medicamento puede ser utilizado para otros usos; si tiene alguna pregunta consulte con su proveedor de atencin mdica o con su  farmacutico. Qu le debo informar a mi profesional de la salud antes de tomar este medicamento? Necesitan saber si usted presenta alguno de los [employer] o situaciones: Anemia por niveles bajos de vitamina B12 en la sangre Una reaccin alrgica o inusual a la leucovorina, al cido flico, a otros medicamentos, alimentos, colorantes o conservantes Si est embarazada o buscando quedar embarazada Si est amamantando a un beb Cmo debo [employer]? Este medicamento se inyecta en una vena o un msculo. Su equipo de atencin lo [employer] en un hospital o en un entorno clnico. Hable con su equipo de atencin sobre el uso de este medicamento en nios. Puede requerir atencin especial. Sobredosis: Pngase en contacto inmediatamente con un centro toxicolgico o una sala de urgencia si usted cree que haya tomado demasiado medicamento.<br>ATENCIN: [employer] American es solo para usted. No comparta este medicamento con nadie. Qu sucede si me olvido de una dosis? Cumpla con las citas para dosis de seguimiento. Es importante no olvidar ninguna dosis. Llame a su equipo de atencin si no puede asistir a una cita. Qu puede interactuar con este medicamento? Capecitabina Fluorouracilo Fenobarbital Fenitona Primidona Trimetoprima;sulfametoxasol Puede ser que esta lista no menciona todas las posibles interacciones. Informe a su profesional de [employer] de In[employer] Inc productos a base de hierbas, [employer]  de venta libre o suplementos nutritivos que est tomando. Si usted fuma, consume bebidas alcohlicas o si utiliza drogas ilegales, indqueselo tambin a su profesional de [employer]. Algunas sustancias pueden interactuar con su medicamento. A qu debo estar atento al usar [employer]? Se supervisar su estado de salud atentamente mientras reciba este medicamento. Este medicamento podra aumentar los efectos secundarios del 5-fluorouracilo. Informe a su equipo de atencin si  tiene diarrea o llagas en la boca que no mejoran o empeoran. Qu efectos secundarios puedo tener al [employer] este medicamento? Efectos secundarios que debe informar a su equipo de atencin tan pronto como sea posible: Reacciones alrgicas: erupcin cutnea, comezn/picazn, urticaria, hinchazn de la cara, los labios, la lengua o la garganta Puede ser que esta lista no menciona todos los posibles efectos secundarios. Comunquese a su mdico por asesoramiento mdico [employer]lett[employer]ard. Usted puede informar los efectos secundarios a la FDA por telfono al 1-800-FDA-1088. Dnde debo guardar mi medicina? Este medicamento se administra en hospitales o clnicas. No se guarda en su casa. <b>ATENCIN: Este folleto es un resumen. Puede ser que no cubra toda la posible informacin. Si usted tiene preguntas acerca de esta medicina, consulte con su mdico, su farmacutico o su profesional de Radiographer, therapeutic.</b>  2024 Elsevier/Gold Standard (2022-03-01 00:00:00)  Fluorouracil Injection Qu es este medicamento? El FLUOROURACILO trata algunos tipos de cncer. Acta desacelerando el crecimiento de clulas cancerosas. Este medicamento puede ser utilizado para otros usos; si tiene alguna pregunta consulte con su proveedor de atencin mdica o con su farmacutico. MARCAS COMUNES: Adrucil Qu le debo informar a mi profesional de la salud antes de tomar este medicamento? Necesitan saber si usted presenta alguno de los siguientes problemas o situaciones: Trastornos sanguneos Deficiencia de dihidropirimidina deshidrogenasa (DPD) Infecciones, tales como varicela, fuegos labiales, herpes Enfermedad renal Enfermedad heptica Desnutricin [place] de radiacin en curso o reciente [place] reaccin alrgica o inusual al fluorouracilo, a otros medicamentos, alimentos, colorantes o conservantes Si usted o su pareja est embarazada o intentando quedar embarazada Si est amamantando a un beb Cmo debo Hydrographic surveyor medicamento? Este medicamento se inyecta en una vena. Su equipo de atencin lo [employer] en un hospital o en un entorno clnico. Hable con su equipo de atencin sobre el uso de este medicamento en nios. Puede requerir atencin especial. Sobredosis: Pngase en contacto inmediatamente con un centro toxicolgico o una sala de urgencia si usted cree que haya tomado demasiado medicamento.<br>ATENCIN: [employer] es solo para usted. No comparta este medicamento con nadie. Qu sucede si me olvido de una dosis? Cumpla con las citas para dosis de seguimiento. Es importante no olvidar ninguna dosis. Llame a su equipo de atencin si no puede asistir a una cita. Qu puede interactuar con este medicamento? No use este medicamento con ninguno de los siguientes productos: Administrator, arts de virus vivos Este medicamento tambin podra Product/process development scientist con los siguientes productos: Medicamentos que tratan o previenen cogulos sanguneos, tales como warfarina, enoxaparina, dalteparina Puede ser que esta lista no menciona todas las posibles interacciones. Informe a su profesional de [employer] de In[employer] Inc productos a base de hierbas, medicamentos de [place] o suplementos nutritivos que est tomando. Si usted fuma, consume bebidas alcohlicas o si utiliza drogas ilegales, indqueselo tambin a su profesional de [employer]. Algunas sustancias pueden interactuar con su medicamento. A qu debo estar atento al usar [employer]? Se supervisar su estado de salud atentamente mientras reciba este medicamento. Este medicamento podra hacerle sentir un Risk analyst. Esto no es  inusual, ya que la quimioterapia puede afectar tanto a las clulas sanas como a las clulas cancerosas. Si presenta algn efecto secundario, infrmelo. Contine con el tratamiento incluso si se siente enfermo, a menos que su equipo de [employer] lo suspenda. En algunos casos, podra recibir [employer] para ayudarle  con los efectos secundarios. Siga todas las instrucciones para usarlos. Este medicamento puede aumentar su riesgo de contraer una infeccin. Llame para pedir consejo a su equipo de atencin si tiene fiebre, escalofros, dolor de garganta o cualquier otro sntoma de resfriado o gripe. No se trate usted mismo. Trate de no acercarse a personas que estn enfermas. Este medicamento podra aumentar el riesgo de moretones o sangrado. Llame a su equipo de atencin si observa sangrados inusuales. Proceda con cuidado al cepillar sus dientes, usar hilo dental o Chemical engineer palillos para los dientes, ya que podra contraer una infeccin o Geophysicist/field seismologist con mayor facilidad. Si recibe algn tratamiento dental, informe a su dentista que est [employer]. Evite usar medicamentos que contengan aspirina, acetaminofeno, ibuprofeno, naproxeno o ketoprofeno, a menos que as lo indique su equipo de atencin. Estos medicamentos pueden ocultar la fiebre. No trate la diarrea con productos de [employer]. Contacte a su equipo de atencin si tiene diarrea por ms de [address], o si es grave y [place]. Este medicamento puede aumentar su sensibilidad al sol. Evite la [employer]. Si no la Network engineer, utilice ropa protectora y crema de Orthoptist. No utilice lmparas solares, camas solares ni cabinas solares. Informe a su equipo de atencin si usted o su pareja est buscando un embarazo o si cree que podra estar embarazada. Este medicamento puede causar defectos congnitos graves si se [place] durante el embarazo y por 3 meses despus de la ltima dosis. Se recomienda Chemical engineer un mtodo anticonceptivo confiable mientras est usando este medicamento y durante 3 meses despus de la ltima dosis. Hable con su equipo de atencin sobre mtodos anticonceptivos eficaces. No embarace a Careers information officer est usando este medicamento y durante 3 meses despus de la ltima dosis. Use un condn durante las relaciones sexuales que mantenga en [place]. No debe amamantar a un beb mientras est [employer]. Este medicamento podra causar infertilidad. Hable con su equipo de atencin si le preocupa su fertilidad. Qu efectos secundarios puedo tener al [employer] este medicamento? Efectos secundarios que debe informar a su equipo de atencin tan pronto como sea posible: Reacciones alrgicas: erupcin cutnea, comezn/picazn, urticaria, hinchazn de la cara, los labios, la lengua o la garganta Ataque cardiaco: Engineer, mining u opresin en el pecho, los hombros, los brazos o la [place], nuseas, falta de [place], piel fra o sudorosa, sensacin de [place] o aturdimiento Insuficiencia cardiaca: falta de aire, hinchazn de los tobillos, los pies o las manos, aumento de peso repentino, debilidad o fatiga inusuales Cambios en el ritmo cardiaco: frecuencia cardiaca rpida o irregular, mareos, sensacin de desmayo o aturdimiento, dolor en el pecho, dificultad para respirar Niveles elevados de amonaco: debilidad o fatiga inusuales, confusin, prdida de apetito, nuseas, vmitos, convulsiones Infeccin: fiebre, escalofros, tos, dolor de garganta, heridas que no sanan, dolor o problemas para Geographical information systems officer, sensacin general de [place] o malestar Recuento bajo de glbulos rojos: debilidad o fatiga inusuales, mareo, dolor de cabeza, dificultad para Dietitian, hormigueo o entumecimiento en las manos o los pies, debilidad muscular, cambios en la visin, confusin o dificultad para hablar, prdida del equilibrio o la coordinacin, dificultad para caminar, convulsiones Enrojecimiento, hinchazn y ampollas en  la piel de las manos y los pies Diarrea grave o prolongada Sangrado o moretones inusuales Efectos secundarios que generalmente no requieren atencin mdica (debe informarlos a su equipo de atencin si persisten o si son molestos): Piel seca Dolor de cabeza Aumento de Armed forces technical officer, enrojecimiento o hinchazn con llagas dentro de la boca  o la garganta Sensibilidad a la luz Vmito Puede ser que esta lista no menciona todos los posibles efectos secundarios. Comunquese a su mdico por asesoramiento mdico [employer]ard. Usted puede informar los efectos secundarios a la FDA por telfono al 1-800-FDA-1088. Dnde debo guardar mi medicina? Este medicamento se administra en hospitales o clnicas. No se guarda en su casa. <b>ATENCIN: Este folleto es un resumen. Puede ser que no cubra toda la posible informacin. Si usted tiene preguntas acerca de esta medicina, consulte con su mdico, su farmacutico o su profesional de Radiographer, therapeutic.</b>  2024 Elsevier/Gold Standard (2022-03-01 00:00:00)

## 2022-12-27 ENCOUNTER — Inpatient Hospital Stay: Payer: Commercial Managed Care - HMO | Admitting: Nutrition

## 2022-12-27 NOTE — Progress Notes (Signed)
82 yo male diagnosed with Colon cancer in 2020. He developed metastases in 2022 and progression on CT in July 2024. He will have a staging CT after 4 cycles of FOLFOX. He is followed by Dr. Truett Perna.  PMH includes Anemia.  Medications include Vitamin C, B complex, Nexium, Imodium AD, MMW, and Compazine.  Labs include Na 134 and Glucose 103.  Height: 5'8". Weight: 150 pounds 11.2 oz. UBW: ~160 pounds. BMI: 22.91.  Patient and son present to nutrition consult. We were joined by an interpreter later in the visit. Patient reports malaise 10 days after treatment. He denies mouth sores. He has poor appetite and taste alterations which result in poor oral intake and weight loss. He reports cold sensitivity and nausea after treatment. Compazine improves nausea. Diarrhea after treatment improved with Imodium AD. Pain improved with Tramadol. He drinks 1-1.5 bottles of Ensure Clear daily. He does not like "milky" Ensure. No leg edema noted. NFPE deferred.  Nutrition Diagnosis: Unintended wt loss related to cancer and associated treatments as evidenced by 8% loss in ~1 month; severe for timeframe  Intervention: Educated to consume small, frequent meals and snacks throughout the day. Drink ONS 3 times daily between meals. Provided coupons. Educated about [employer] as an alternative clear liquid ONS with more calories and protein. Begin baking soda and salt water gargles before eating and every morning and night to improve taste alterations. Reviewed high calorie and high protein foods. Provided nutrition fact sheets. Gave contact information. Questions answered.  Monitoring, Evaluation, Goals: Patient will increase oral intake to minimize further weight loss.  No follow up scheduled at this time. Encouraged patient/family to contact RD with questions/concerns.

## 2022-12-28 ENCOUNTER — Inpatient Hospital Stay: Payer: Commercial Managed Care - HMO

## 2022-12-28 VITALS — BP 122/50 | HR 89 | Temp 98.0°F | Resp 18

## 2022-12-28 DIAGNOSIS — C189 Malignant neoplasm of colon, unspecified: Secondary | ICD-10-CM

## 2022-12-28 DIAGNOSIS — Z5111 Encounter for antineoplastic chemotherapy: Secondary | ICD-10-CM | POA: Diagnosis not present

## 2022-12-28 MED ORDER — HEPARIN SOD (PORK) LOCK FLUSH 100 UNIT/ML IV SOLN
500.0000 [IU] | Freq: Once | INTRAVENOUS | Status: AC | PRN
  Administered 2022-12-28: 500 [IU]

## 2022-12-28 MED ORDER — SODIUM CHLORIDE 0.9% FLUSH
10.0000 mL | INTRAVENOUS | Status: DC | PRN
  Administered 2022-12-28: 10 mL

## 2022-12-28 NOTE — Patient Instructions (Signed)

## 2023-01-01 ENCOUNTER — Other Ambulatory Visit: Payer: Self-pay | Admitting: Oncology

## 2023-01-02 ENCOUNTER — Encounter: Payer: Self-pay | Admitting: Oncology

## 2023-01-05 ENCOUNTER — Encounter: Payer: Self-pay | Admitting: *Deleted

## 2023-01-05 NOTE — Progress Notes (Signed)
Completed PA form for renewal of Udenyca and faxed to [facility] [phone].

## 2023-01-06 ENCOUNTER — Other Ambulatory Visit: Payer: Self-pay | Admitting: Oncology

## 2023-01-06 DIAGNOSIS — C189 Malignant neoplasm of colon, unspecified: Secondary | ICD-10-CM

## 2023-01-09 ENCOUNTER — Inpatient Hospital Stay: Payer: Commercial Managed Care - HMO

## 2023-01-09 ENCOUNTER — Inpatient Hospital Stay: Payer: Commercial Managed Care - HMO | Admitting: Nurse Practitioner

## 2023-01-09 ENCOUNTER — Encounter: Payer: Self-pay | Admitting: Nurse Practitioner

## 2023-01-09 VITALS — BP 136/62 | HR 100 | Temp 98.1°F | Resp 18 | Ht 68.0 in | Wt 150.0 lb

## 2023-01-09 DIAGNOSIS — C189 Malignant neoplasm of colon, unspecified: Secondary | ICD-10-CM | POA: Diagnosis not present

## 2023-01-09 DIAGNOSIS — Z95828 Presence of other vascular implants and grafts: Secondary | ICD-10-CM

## 2023-01-09 DIAGNOSIS — Z5111 Encounter for antineoplastic chemotherapy: Secondary | ICD-10-CM | POA: Diagnosis not present

## 2023-01-09 LAB — CBC WITH DIFFERENTIAL (CANCER CENTER ONLY)
Abs Immature Granulocytes: 0.12 10*3/uL — ABNORMAL HIGH (ref 0.00–0.07)
Basophils Absolute: 0.1 10*3/uL (ref 0.0–0.1)
Basophils Relative: 0 %
Eosinophils Absolute: 0.1 10*3/uL (ref 0.0–0.5)
Eosinophils Relative: 1 %
HCT: 34.5 % — ABNORMAL LOW (ref 39.0–52.0)
Hemoglobin: 11.2 g/dL — ABNORMAL LOW (ref 13.0–17.0)
Immature Granulocytes: 1 %
Lymphocytes Relative: 5 %
Lymphs Abs: 0.8 10*3/uL (ref 0.7–4.0)
MCH: 29.2 pg (ref 26.0–34.0)
MCHC: 32.5 g/dL (ref 30.0–36.0)
MCV: 89.8 fL (ref 80.0–100.0)
Monocytes Absolute: 1.5 10*3/uL — ABNORMAL HIGH (ref 0.1–1.0)
Monocytes Relative: 10 %
Neutro Abs: 12.6 10*3/uL — ABNORMAL HIGH (ref 1.7–7.7)
Neutrophils Relative %: 83 %
Platelet Count: 159 10*3/uL (ref 150–400)
RBC: 3.84 MIL/uL — ABNORMAL LOW (ref 4.22–5.81)
RDW: 17.2 % — ABNORMAL HIGH (ref 11.5–15.5)
WBC Count: 15.2 10*3/uL — ABNORMAL HIGH (ref 4.0–10.5)
nRBC: 0 % (ref 0.0–0.2)

## 2023-01-09 LAB — CMP (CANCER CENTER ONLY)
ALT: 41 U/L (ref 0–44)
AST: 88 U/L — ABNORMAL HIGH (ref 15–41)
Albumin: 3.2 g/dL — ABNORMAL LOW (ref 3.5–5.0)
Alkaline Phosphatase: 314 U/L — ABNORMAL HIGH (ref 38–126)
Anion gap: 7 (ref 5–15)
BUN: 25 mg/dL — ABNORMAL HIGH (ref 8–23)
CO2: 24 mmol/L (ref 22–32)
Calcium: 9 mg/dL (ref 8.9–10.3)
Chloride: 103 mmol/L (ref 98–111)
Creatinine: 0.67 mg/dL (ref 0.61–1.24)
GFR, Estimated: >60 mL/min (ref >60)
Glucose, Bld: 95 mg/dL (ref 70–99)
Potassium: 4.1 mmol/L (ref 3.5–5.1)
Sodium: 134 mmol/L — ABNORMAL LOW (ref 135–145)
Total Bilirubin: 1.7 mg/dL — ABNORMAL HIGH (ref 0.3–1.2)
Total Protein: 6.8 g/dL (ref 6.5–8.1)

## 2023-01-09 LAB — CEA (ACCESS): CEA (CHCC): 852.27 ng/mL — ABNORMAL HIGH (ref 0.00–5.00)

## 2023-01-09 MED ORDER — HEPARIN SOD (PORK) LOCK FLUSH 100 UNIT/ML IV SOLN
500.0000 [IU] | Freq: Once | INTRAVENOUS | Status: AC
  Administered 2023-01-09: 500 [IU] via INTRAVENOUS

## 2023-01-09 MED ORDER — SODIUM CHLORIDE 0.9% FLUSH
10.0000 mL | INTRAVENOUS | Status: AC | PRN
  Administered 2023-01-09: 10 mL via INTRAVENOUS

## 2023-01-09 MED ORDER — PREDNISONE 10 MG PO TABS: 10.0000 mg | ORAL_TABLET | Freq: Every day | ORAL | 1 refills | Status: DC

## 2023-01-09 NOTE — Progress Notes (Signed)
Patient seen by Lonna Cobb NP today  Vitals are within treatment parameters.  Labs reviewed by Lonna Cobb NP and are not all within treatment parameters. CEA  852.27  Per physician team, patient will not be receiving treatment today.

## 2023-01-09 NOTE — Progress Notes (Signed)
[facility] OFFICE PROGRESS NOTE   Diagnosis: Colon cancer  INTERVAL HISTORY:   John Vance returns as scheduled.  He completed cycle 3 FOLFOX 12/26/2022.  He had some nausea.  No mouth sores.  No diarrhea.  He describes stool as "hard".  He has 2 bowel movements a day.  He is more fatigued.  Appetite is poor.  Mild pain at the right abdomen, relieved with one half of a tramadol tablet.  He has persistent cold sensitivity.  Continues to note difficulty buttoning clothes.  Symptoms are slightly worse.  No bone pain.  Objective:  Vital signs in last 24 hours:  Blood pressure 136/62, pulse 100, temperature 98.1 F (36.7 C), temperature source Oral, resp. rate 18, height 5\' 8"  (1.727 m), weight 150 lb (68 kg), SpO2 98%.    HEENT: No thrush or ulcers. Resp: Faint rales at both lung bases.  No respiratory distress. Cardio: Regular rate and rhythm. GI: Liver palpable right abdomen. Vascular: No leg edema. Neuro: Vibratory sense moderately decreased over the fingertips bilaterally per tuning fork exam. Skin: Palms without erythema.  Palms with skin thickening. Port-A-Cath without erythema.  Lab Results:  Lab Results  Component Value Date   WBC 15.2 (H) 01/09/2023   HGB 11.2 (L) 01/09/2023   HCT 34.5 (L) 01/09/2023   MCV 89.8 01/09/2023   PLT 159 01/09/2023   NEUTROABS 12.6 (H) 01/09/2023    Imaging:  No results found.  Medications: I have reviewed the patient's current medications.  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), transverse colon cancer stage IIB (T4aN0) status post extended right hemicolectomy 05/07/2019, MSS Tumor invasive superficial portion of the muscle ("internal third "), no tumor perforation, no vascular invasion or perineural invasion, negative resection margins, 0/4 lymph nodes Elevated CEA 02/10/2019 CTs 03/05/2019,  compared to outside CTs from 11/22/2018-worsened wall thickening in the cecum stable apple core lesion in the mid transverse colon, no evidence of recurrent tumor at the distal colonic anastomosis, no evidence of metastatic disease Colonoscopy 03/24/2019 20-2 malignant appearing masses noted in the colon, 1 filled the cecum measuring approximately 4 cm across and friable.  The other malignant appearing mass was circumferential creating a stricture with a 1.2 cm lumen located in the transverse segment approximately 4 to 5 cm in length.  There were 12-18 neoplastic appearing polyps in between the 2 obvious malignant masses ranging in size from 3 mm to 2 cm.  5 polyps distal to the transverse colon mass.  2 sigmoid colon polyps.  Cecum mass positive for adenocarcinoma.  Transverse colon mass positive for adenocarcinoma. Foundation 1 transverse colon-MSS, tumor mutation burden 7, KRAS wild-type, BRAF fusion 05/07/2019 laparoscopic extended right hemicolectomy, lysis of adhesions-5 cm invasive moderately differentiated adenocarcinoma involving cecum and proximal ascending colon, carcinoma invades into the pericolonic soft tissue; 7 cm invasive moderately differentiated carcinoma involving the transverse colon, carcinoma invades into the serosal surface (visceral peritoneum); all resection margins negative for carcinoma.  Lymphovascular invasion present.  In the proximal colon metastatic carcinoma involves 3 of 17 lymph nodes with 1 tumor deposit.  In the transverse colon 12 lymph nodes negative for metastatic carcinoma.  3 separate polyps: Tubular adenoma, inflammatory polyp and hyperplastic polyp Cycle 1 capecitabine 06/02/2019 Cycle 2 capecitabine 06/23/2019 Cycle 3 capecitabine 07/14/2019 Cycle 4 capecitabine 08/04/2019 Cycle 5 capecitabine 08/25/2019 Cycle 6 capecitabine 09/15/2019 Cycle 7 capecitabine 10/06/2019 Cycle 8 capecitabine 10/27/2019 Upper endoscopy 07/23/2020-negative  Colonoscopy 07/23/2020-normal-appearing  anastomoses, polyp removed from the descending colon-tubular adenoma CTs 01/18/2021-right abdominal mesenteric mass, mass in the low anatomic pelvis consistent with metastases Cycle 1 FOLFOX 02/21/2021 Cycle 2 FOLFOX 03/07/2021, dose reductions made due to mild neutropenia Cycle 3 FOLFOX 03/21/2021 Cycle 4 FOLFOX 04/04/2021 Cycle 5 FOLFOX 04/19/2021 Cycle 6 FOLFOX 05/02/2021 CTs 05/12/2021-decrease size of nodular right mesenteric mass, complete resolution of soft tissue enhancing nodule in the right inguinal canal, resolution of nodular soft tissue mass posterior to the bladder, enlargement of a left upper lobe nodule and new 4 mm left upper lobe nodule-indeterminate Cycle 7 FOLFOX 05/16/2021 Cycle 8 FOLFOX 05/30/2021, oxaliplatin and 5-FU bolus held due to neuropathy and thrombocytopenia Cycle 9 FOLFOX 06/13/2021, oxaliplatin dose reduced Cycle 10 FOLFOX 06/27/2021 CT 07/07/2021-decrease in right mesenteric mass, decreased size of lobulated left upper lobe nodule, resolution of previous "new "left upper lobe nodule, new area of wall thickening at the distal transverse colon Treatment changed to Xeloda 2 weeks on/1 week off beginning 07/18/2021 Cycle 2 Xeloda beginning 08/09/2021 Cycle 3 Xeloda beginning 08/30/2021 Cycle 4 Xeloda beginning 09/21/2021 Cycle 5 Xeloda beginning 10/12/2021 Cycle 6 Xeloda beginning 11/02/2021 CTs 11/14/2021-right lower quadrant spiculated small bowel mesenteric mass measured 2.8 x 3.7 cm, previously 1.0 x 2.3 cm; adjacent mesenteric haziness and nodularity measuring up to 8 mm superiorly.  New 4 mm nodular lesion posterior segment right upper lobe along the major fissure. Treatment break beginning 11/17/2021 CTs 03/21/2022-multiple new liver metastases, enlarging implant in the right ileocolic mesentery, enlarging peritoneal implants in the low pelvis Cycle 1 FOLFIRI/Avastin 03/29/2022 Cycle 2 FOLFIRI/Avastin 04/11/2022 Cycle 3 FOLFIRI/Avastin 04/26/2022 Cycle 4 FOLFIRI/Avastin  05/10/2022 Cycle 5 FOLFIRI/Avastin 05/24/2022 CT abdomen/pelvis 06/02/2022-liver metastases are stable and smaller, smaller peritoneal/mesenteric nodules Cycle 6 FOLFIRI/Avastin 06/06/2022 Cycle 7 FOLFIRI/Avastin 06/20/2022 Cycle 8 FOLFIRI/Avastin 07/04/2022 Treatment held 07/18/2022 due to neutropenia Cycle 9 FOLFIRI/Avastin 07/24/2022, Udenyca Cycle 10 FOLFIRI/Avastin 08/08/2022, Udenyca CTs 08/18/2022-slight enlargement of several liver lesions, right mesenteric mass and low pelvic soft tissue nodule are stable Cycle 1 FOLFIRI/Panitumumab 08/29/2022 Cycle 2 FOLFIRI/Panitumumab 09/12/2022 Cycle 3 FOLFIRI/panitumumab 09/26/2022 Cycle 4 FOLFIRI/Panitumumab 10/23/2022, Panitumumab dose reduced Cycle 5 FOLFIRI/panitumumab 11/07/2022 CT 11/20/2022-interval progression with increased size and number of liver metastases; new enlarged right juxta caval lymph node; slight increase in size of nodal mass within the right ileocolic mesentery; slight increase in size of subserosal nodule along the anterior aspect of the proximal rectum Cycle 1 FOLFOX 11/28/2022 Cycle 2 FOLFOX 12/12/2022 Cycle 3 FOLFOX 12/26/2022 01/09/2023 CEA higher, poor performance status, chemotherapy held, referred for CT scans 2.   Microcytic anemia secondary to #1.    Resolved 3.   Family history of pancreas and colon cancer, negative genetic testing per the [employer] panel, ATM variant of unknown significance 4.   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  Urinary retention 11/01/2022-Foley catheter placed; catheter removed 11/22/2022, reports urinating without difficulty 11/28/2022  Disposition: John Vance appears stable.  He has completed 3 cycles of FOLFOX.  CEA is higher.  Performance status declining.  We decided to hold today's treatment and refer for restaging scans.  He will begin prednisone 10 mg daily  to see if this will help with appetite/energy.  He will begin Colace twice daily.  He will return for follow-up as scheduled in 2 weeks.  Lonna Cobb ANP/GNP-BC   01/09/2023  9:19 AM

## 2023-01-09 NOTE — Addendum Note (Signed)
Addended by: Dimitri Ped on: 01/09/2023 09:36 AM   Modules accepted: Orders

## 2023-01-11 ENCOUNTER — Inpatient Hospital Stay: Payer: Commercial Managed Care - HMO

## 2023-01-17 ENCOUNTER — Ambulatory Visit (HOSPITAL_BASED_OUTPATIENT_CLINIC_OR_DEPARTMENT_OTHER)
Admission: RE | Admit: 2023-01-17 | Discharge: 2023-01-17 | Disposition: A | Discharge status: Home / Self Care | Payer: Commercial Managed Care - HMO | Source: Ambulatory Visit | Attending: Nurse Practitioner | Admitting: Nurse Practitioner

## 2023-01-17 DIAGNOSIS — C189 Malignant neoplasm of colon, unspecified: Secondary | ICD-10-CM | POA: Diagnosis present

## 2023-01-17 MED ORDER — IOHEXOL 300 MG/ML  SOLN
100.0000 mL | Freq: Once | INTRAMUSCULAR | Status: AC | PRN
  Administered 2023-01-17: 100 mL via INTRAVENOUS

## 2023-01-23 ENCOUNTER — Encounter: Payer: Self-pay | Admitting: *Deleted

## 2023-01-23 ENCOUNTER — Inpatient Hospital Stay: Payer: Managed Care, Other (non HMO) | Attending: Oncology

## 2023-01-23 ENCOUNTER — Inpatient Hospital Stay: Payer: Managed Care, Other (non HMO)

## 2023-01-23 ENCOUNTER — Encounter: Payer: Self-pay | Admitting: Oncology

## 2023-01-23 ENCOUNTER — Other Ambulatory Visit: Payer: Self-pay | Admitting: *Deleted

## 2023-01-23 ENCOUNTER — Inpatient Hospital Stay: Payer: Managed Care, Other (non HMO) | Admitting: Oncology

## 2023-01-23 DIAGNOSIS — C189 Malignant neoplasm of colon, unspecified: Secondary | ICD-10-CM

## 2023-01-23 DIAGNOSIS — B370 Candidal stomatitis: Secondary | ICD-10-CM | POA: Insufficient documentation

## 2023-01-23 DIAGNOSIS — C787 Secondary malignant neoplasm of liver and intrahepatic bile duct: Secondary | ICD-10-CM | POA: Diagnosis not present

## 2023-01-23 DIAGNOSIS — R630 Anorexia: Secondary | ICD-10-CM | POA: Insufficient documentation

## 2023-01-23 DIAGNOSIS — G620 Drug-induced polyneuropathy: Secondary | ICD-10-CM

## 2023-01-23 DIAGNOSIS — C187 Malignant neoplasm of sigmoid colon: Secondary | ICD-10-CM

## 2023-01-23 DIAGNOSIS — F419 Anxiety disorder, unspecified: Secondary | ICD-10-CM | POA: Diagnosis not present

## 2023-01-23 DIAGNOSIS — C180 Malignant neoplasm of cecum: Secondary | ICD-10-CM | POA: Insufficient documentation

## 2023-01-23 DIAGNOSIS — Z800 Family history of malignant neoplasm of digestive organs: Secondary | ICD-10-CM | POA: Insufficient documentation

## 2023-01-23 DIAGNOSIS — C779 Secondary and unspecified malignant neoplasm of lymph node, unspecified: Secondary | ICD-10-CM | POA: Diagnosis not present

## 2023-01-23 DIAGNOSIS — C184 Malignant neoplasm of transverse colon: Secondary | ICD-10-CM | POA: Diagnosis not present

## 2023-01-23 LAB — CBC WITH DIFFERENTIAL (CANCER CENTER ONLY)
Abs Immature Granulocytes: 0.09 10*3/uL — ABNORMAL HIGH (ref 0.00–0.07)
Basophils Absolute: 0 10*3/uL (ref 0.0–0.1)
Basophils Relative: 0 %
Eosinophils Absolute: 0.1 10*3/uL (ref 0.0–0.5)
Eosinophils Relative: 1 %
HCT: 36.4 % — ABNORMAL LOW (ref 39.0–52.0)
Hemoglobin: 11.7 g/dL — ABNORMAL LOW (ref 13.0–17.0)
Immature Granulocytes: 1 %
Lymphocytes Relative: 7 %
Lymphs Abs: 1.1 10*3/uL (ref 0.7–4.0)
MCH: 29 pg (ref 26.0–34.0)
MCHC: 32.1 g/dL (ref 30.0–36.0)
MCV: 90.1 fL (ref 80.0–100.0)
Monocytes Absolute: 1.3 10*3/uL — ABNORMAL HIGH (ref 0.1–1.0)
Monocytes Relative: 9 %
Neutro Abs: 12 10*3/uL — ABNORMAL HIGH (ref 1.7–7.7)
Neutrophils Relative %: 82 %
Platelet Count: 221 10*3/uL (ref 150–400)
RBC: 4.04 MIL/uL — ABNORMAL LOW (ref 4.22–5.81)
RDW: 16.2 % — ABNORMAL HIGH (ref 11.5–15.5)
WBC Count: 14.6 10*3/uL — ABNORMAL HIGH (ref 4.0–10.5)
nRBC: 0 % (ref 0.0–0.2)

## 2023-01-23 LAB — CMP (CANCER CENTER ONLY)
ALT: 42 U/L (ref 0–44)
AST: 70 U/L — ABNORMAL HIGH (ref 15–41)
Albumin: 3.2 g/dL — ABNORMAL LOW (ref 3.5–5.0)
Alkaline Phosphatase: 285 U/L — ABNORMAL HIGH (ref 38–126)
Anion gap: 8 (ref 5–15)
BUN: 25 mg/dL — ABNORMAL HIGH (ref 8–23)
CO2: 23 mmol/L (ref 22–32)
Calcium: 9.9 mg/dL (ref 8.9–10.3)
Chloride: 101 mmol/L (ref 98–111)
Creatinine: 0.56 mg/dL — ABNORMAL LOW (ref 0.61–1.24)
GFR, Estimated: >60 mL/min (ref >60)
Glucose, Bld: 100 mg/dL — ABNORMAL HIGH (ref 70–99)
Potassium: 4.2 mmol/L (ref 3.5–5.1)
Sodium: 132 mmol/L — ABNORMAL LOW (ref 135–145)
Total Bilirubin: 1.8 mg/dL — ABNORMAL HIGH (ref 0.3–1.2)
Total Protein: 6.9 g/dL (ref 6.5–8.1)

## 2023-01-23 LAB — CEA (ACCESS): CEA (CHCC): 957.8 ng/mL — ABNORMAL HIGH (ref 0.00–5.00)

## 2023-01-23 MED ORDER — FLUCONAZOLE 100 MG PO TABS: 100.0000 mg | ORAL_TABLET | Freq: Every day | ORAL | 0 refills | Status: DC

## 2023-01-23 MED ORDER — SODIUM CHLORIDE 0.9% FLUSH
10.0000 mL | INTRAVENOUS | Status: DC | PRN
  Administered 2023-01-23: 10 mL via INTRAVENOUS

## 2023-01-23 MED ORDER — HEPARIN SOD (PORK) LOCK FLUSH 100 UNIT/ML IV SOLN
500.0000 [IU] | Freq: Once | INTRAVENOUS | Status: AC
  Administered 2023-01-23: 500 [IU] via INTRAVENOUS

## 2023-01-23 MED ORDER — ALPRAZOLAM 0.25 MG PO TABS: 0.2500 mg | ORAL_TABLET | Freq: Three times a day (TID) | ORAL | 0 refills | Status: DC | PRN

## 2023-01-23 MED ORDER — OXYCODONE HCL 5 MG PO TABS: 2.5000 mg | ORAL_TABLET | ORAL | 0 refills | Status: DC | PRN

## 2023-01-23 NOTE — Progress Notes (Signed)
Placed Hospice referral per Dr. Truett Perna orders. He will be attending. Requesting to be seen this week. Code status not discussed at visit today since his son, Elita Quick that usually comes was not present.

## 2023-01-23 NOTE — Progress Notes (Signed)
[facility] OFFICE PROGRESS NOTE   Diagnosis: Colon cancer  INTERVAL HISTORY:   Mr. Nonie Hoyer returns as scheduled.  He is here with his son and a Spanish interpreter.  He reports anorexia.  He has increased pain in the right upper abdomen.  He is taking tramadol frequently.  This partially relieves the pain.  He stays in the bed or on the sofa most of the time  Objective:  Vital signs in last 24 hours:  Blood pressure (!) 118/57, pulse (!) 104, temperature 98.1 F (36.7 C), resp. rate 18, height 5\' 8"  (1.727 m), weight 143 lb (64.9 kg), SpO2 99%.    HEENT: Thick white coat over the tongue Resp: Lungs clear bilaterally Cardio: Regular rate and rhythm GI: Firm fullness in the right upper abdomen Vascular: No leg edema Neuro: Lethargic, arousable, follows commands    Portacath/PICC-without erythema  Lab Results:  Lab Results  Component Value Date   WBC 14.6 (H) 01/23/2023   HGB 11.7 (L) 01/23/2023   HCT 36.4 (L) 01/23/2023   MCV 90.1 01/23/2023   PLT 221 01/23/2023   NEUTROABS 12.0 (H) 01/23/2023    CMP  Lab Results  Component Value Date   NA 134 (L) 01/09/2023   K 4.1 01/09/2023   CL 103 01/09/2023   CO2 24 01/09/2023   GLUCOSE 95 01/09/2023   BUN 25 (H) 01/09/2023   CREATININE 0.67 01/09/2023   CALCIUM 9.0 01/09/2023   PROT 6.8 01/09/2023   ALBUMIN 3.2 (L) 01/09/2023   AST 88 (H) 01/09/2023   ALT 41 01/09/2023   ALKPHOS 314 (H) 01/09/2023   BILITOT 1.7 (H) 01/09/2023   GFRNONAA >60 01/09/2023   GFRAA >60 12/01/2019    Lab Results  Component Value Date   CEA1 12.10 (H) 01/03/2021   CEA 852.27 (H) 01/09/2023     Medications: I have reviewed the patient's current medications.   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), transverse colon cancer stage IIB (T4aN0) status post extended right hemicolectomy 05/07/2019,  MSS Tumor invasive superficial portion of the muscle ("internal third "), no tumor perforation, no vascular invasion or perineural invasion, negative resection margins, 0/4 lymph nodes Elevated CEA 02/10/2019 CTs 03/05/2019, compared to outside CTs from 11/22/2018-worsened wall thickening in the cecum stable apple core lesion in the mid transverse colon, no evidence of recurrent tumor at the distal colonic anastomosis, no evidence of metastatic disease Colonoscopy 03/24/2019 20-2 malignant appearing masses noted in the colon, 1 filled the cecum measuring approximately 4 cm across and friable.  The other malignant appearing mass was circumferential creating a stricture with a 1.2 cm lumen located in the transverse segment approximately 4 to 5 cm in length.  There were 12-18 neoplastic appearing polyps in between the 2 obvious malignant masses ranging in size from 3 mm to 2 cm.  5 polyps distal to the transverse colon mass.  2 sigmoid colon polyps.  Cecum mass positive for adenocarcinoma.  Transverse colon mass positive for adenocarcinoma. Foundation 1 transverse colon-MSS, tumor mutation burden 7, KRAS wild-type, BRAF fusion 05/07/2019 laparoscopic extended right hemicolectomy, lysis of adhesions-5 cm invasive moderately differentiated adenocarcinoma involving cecum and proximal ascending colon, carcinoma invades into the pericolonic soft tissue; 7 cm invasive moderately differentiated carcinoma involving the transverse colon, carcinoma invades into the serosal surface (visceral peritoneum); all resection margins negative for carcinoma.  Lymphovascular invasion present.  In the proximal colon metastatic carcinoma  involves 3 of 17 lymph nodes with 1 tumor deposit.  In the transverse colon 12 lymph nodes negative for metastatic carcinoma.  3 separate polyps: Tubular adenoma, inflammatory polyp and hyperplastic polyp Cycle 1 capecitabine 06/02/2019 Cycle 2 capecitabine 06/23/2019 Cycle 3 capecitabine 07/14/2019 Cycle  4 capecitabine 08/04/2019 Cycle 5 capecitabine 08/25/2019 Cycle 6 capecitabine 09/15/2019 Cycle 7 capecitabine 10/06/2019 Cycle 8 capecitabine 10/27/2019 Upper endoscopy 07/23/2020-negative Colonoscopy 07/23/2020-normal-appearing anastomoses, polyp removed from the descending colon-tubular adenoma CTs 01/18/2021-right abdominal mesenteric mass, mass in the low anatomic pelvis consistent with metastases Cycle 1 FOLFOX 02/21/2021 Cycle 2 FOLFOX 03/07/2021, dose reductions made due to mild neutropenia Cycle 3 FOLFOX 03/21/2021 Cycle 4 FOLFOX 04/04/2021 Cycle 5 FOLFOX 04/19/2021 Cycle 6 FOLFOX 05/02/2021 CTs 05/12/2021-decrease size of nodular right mesenteric mass, complete resolution of soft tissue enhancing nodule in the right inguinal canal, resolution of nodular soft tissue mass posterior to the bladder, enlargement of a left upper lobe nodule and new 4 mm left upper lobe nodule-indeterminate Cycle 7 FOLFOX 05/16/2021 Cycle 8 FOLFOX 05/30/2021, oxaliplatin and 5-FU bolus held due to neuropathy and thrombocytopenia Cycle 9 FOLFOX 06/13/2021, oxaliplatin dose reduced Cycle 10 FOLFOX 06/27/2021 CT 07/07/2021-decrease in right mesenteric mass, decreased size of lobulated left upper lobe nodule, resolution of previous "new "left upper lobe nodule, new area of wall thickening at the distal transverse colon Treatment changed to Xeloda 2 weeks on/1 week off beginning 07/18/2021 Cycle 2 Xeloda beginning 08/09/2021 Cycle 3 Xeloda beginning 08/30/2021 Cycle 4 Xeloda beginning 09/21/2021 Cycle 5 Xeloda beginning 10/12/2021 Cycle 6 Xeloda beginning 11/02/2021 CTs 11/14/2021-right lower quadrant spiculated small bowel mesenteric mass measured 2.8 x 3.7 cm, previously 1.0 x 2.3 cm; adjacent mesenteric haziness and nodularity measuring up to 8 mm superiorly.  New 4 mm nodular lesion posterior segment right upper lobe along the major fissure. Treatment break beginning 11/17/2021 CTs 03/21/2022-multiple new liver metastases, enlarging  implant in the right ileocolic mesentery, enlarging peritoneal implants in the low pelvis Cycle 1 FOLFIRI/Avastin 03/29/2022 Cycle 2 FOLFIRI/Avastin 04/11/2022 Cycle 3 FOLFIRI/Avastin 04/26/2022 Cycle 4 FOLFIRI/Avastin 05/10/2022 Cycle 5 FOLFIRI/Avastin 05/24/2022 CT abdomen/pelvis 06/02/2022-liver metastases are stable and smaller, smaller peritoneal/mesenteric nodules Cycle 6 FOLFIRI/Avastin 06/06/2022 Cycle 7 FOLFIRI/Avastin 06/20/2022 Cycle 8 FOLFIRI/Avastin 07/04/2022 Treatment held 07/18/2022 due to neutropenia Cycle 9 FOLFIRI/Avastin 07/24/2022, Udenyca Cycle 10 FOLFIRI/Avastin 08/08/2022, Udenyca CTs 08/18/2022-slight enlargement of several liver lesions, right mesenteric mass and low pelvic soft tissue nodule are stable Cycle 1 FOLFIRI/Panitumumab 08/29/2022 Cycle 2 FOLFIRI/Panitumumab 09/12/2022 Cycle 3 FOLFIRI/panitumumab 09/26/2022 Cycle 4 FOLFIRI/Panitumumab 10/23/2022, Panitumumab dose reduced Cycle 5 FOLFIRI/panitumumab 11/07/2022 CT 11/20/2022-interval progression with increased size and number of liver metastases; new enlarged right juxta caval lymph node; slight increase in size of nodal mass within the right ileocolic mesentery; slight increase in size of subserosal nodule along the anterior aspect of the proximal rectum Cycle 1 FOLFOX 11/28/2022 Cycle 2 FOLFOX 12/12/2022 Cycle 3 FOLFOX 12/26/2022 01/09/2023 CEA higher, poor performance status, chemotherapy held, referred for CT scans 2.   Microcytic anemia secondary to #1.    Resolved 3.   Family history of pancreas and colon cancer, negative genetic testing per the [facility] panel, ATM variant of unknown significance 4.   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  Urinary retention 11/01/2022-Foley catheter placed; catheter removed 11/22/2022, reports urinating without difficulty  11/28/2022    Disposition: Mr. Nonie Hoyer has metastatic colon cancer.  There is clinical and radiologic  evidence of disease progression.  The CEA is higher.  I reviewed the 01/17/2023 CT abdomen/pelvis images with Mr. Nonie Hoyer and his son.  The final report is pending.  There appear to be new and enlarging liver metastases.  We discussed treatment options.  We specifically discussed Lonsurf/bevacizumab versus comfort care.  Mr. Nonie Hoyer has a poor performance status.  I think it would be difficult for him to tolerate further systemic therapy.  I recommend hospice care.  He is in agreement.  We will make a referral to [facility] for home hospice care.  He will begin a trial of oxycodone for pain and alprazolam for anxiety.  He will complete a course of Diflucan for oral candidiasis.  Mr. Nonie Hoyer will return for an office visit in 2 weeks.  Thornton Papas, MD  01/23/2023  9:06 AM

## 2023-01-23 NOTE — Progress Notes (Unsigned)
Patient seen by Dr. Thornton Papas today  Vitals are within treatment parameters:{Blank single:19197::"Yes ","No (Please specify and give further instructions.)"}  Labs are within treatment parameters: {Blank single:19197::"Yes","No (Please specify and give further instructions.)"}   Treatment plan has been signed: Yes   Per physician team, {Blank single:19197::"Patient is ready for treatment and there are NO modifications to the treatment plan.","Patient is ready for treatment. Please note the following modifications:","Patient will not be receiving treatment today."}

## 2023-01-25 ENCOUNTER — Inpatient Hospital Stay: Payer: Managed Care, Other (non HMO)

## 2023-01-26 ENCOUNTER — Telehealth: Payer: Self-pay | Admitting: *Deleted

## 2023-01-26 NOTE — Telephone Encounter (Signed)
Son, Elita Quick called to confirm that MD stopped the Rapalfo on 10/01 and that he is to continue the Prednisone? Informed him that MD d/c'd the Rapaflo due to it lowering his BP. If he starts having trouble voiding can resume. Confirmed to continue the prednisone.

## 2023-01-29 ENCOUNTER — Ambulatory Visit: Payer: Commercial Managed Care - HMO | Admitting: Urology

## 2023-02-02 ENCOUNTER — Telehealth: Payer: Self-pay

## 2023-02-02 NOTE — Telephone Encounter (Signed)
Patients son called to cancel appointment on 02/02/23 @ 9:25AM  for 02/06/23. Son stated he did not wish to reschedule at this time.

## 2023-02-06 ENCOUNTER — Inpatient Hospital Stay: Payer: Managed Care, Other (non HMO) | Admitting: Nurse Practitioner

## 2023-02-06 ENCOUNTER — Ambulatory Visit: Payer: Commercial Managed Care - HMO

## 2023-02-06 ENCOUNTER — Other Ambulatory Visit: Payer: Commercial Managed Care - HMO

## 2023-02-23 DEATH — deceased

## 2023-07-31 IMAGING — CT NM PET TUM IMG RESTAG (PS) SKULL BASE T - THIGH
1 of 10 series · 2 of 25 positions shown · non-contrast
Comparison: CT 01/18/2021, 03/23/2020, 03/05/2019

CLINICAL DATA: Subsequent treatment strategy for colorectal
carcinoma.

EXAM:
NUCLEAR MEDICINE PET SKULL BASE TO THIGH
TECHNIQUE: 9.6 mCi F-18 FDG was injected intravenously. Full-ring PET imaging
was performed from the skull base to thigh after the radiotracer. CT
data was obtained and used for attenuation correction and anatomic
localization.
Fasting blood glucose: 84 mg/dl

[Series 3: ct wb 5.0 b30f · axial · 5.0mm · 0.98mm/px · z∈[-307,+677]mm · 2 of 329 slices shown]
[im 1/329  brain]
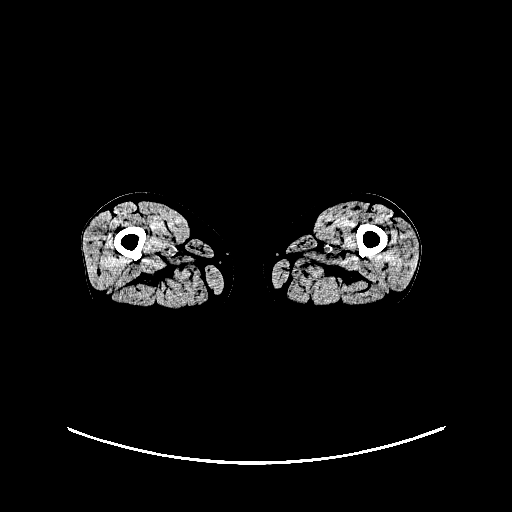
[im 329/329  brain]
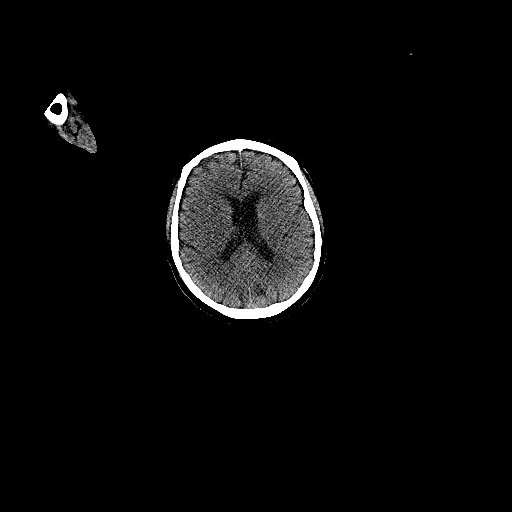

[2 of 25 positions shown; findings below may reference images not displayed]

FINDINGS: Mediastinal blood pool activity: SUV max

Liver activity: SUV max NA

NECK: No hypermetabolic lymph nodes in the neck.

Incidental CT findings: none

CHEST: Within the LEFT upper lobe small 4 mm pulmonary nodule (image
90/CT series 3) has faint metabolic activity SUV max equal 1.0. This
nodule is stable in size compared to CT 03/23/2000 however is new
from CT 03/05/2019.

Incidental CT findings: none

ABDOMEN/PELVIS: Irregular soft tissue mass and adjacent enlarged
lymph nodes in the ileocecal mesentery with intense metabolic
activity. The irregular mass lesion measures 4.3 by 2.4 cm (image
187/3). The largest lymph node in the ileocecal mesentery measures
4.0 x 2.7 cm. This mass and adenopathy ahve intense metabolic
activity SUV max equal 17.4.

Within the mid pelvis peritoneal space, there is as similar
irregular mass posterior to the bladder measuring 4.3 x 2.5 cm. This
also has intense hypermetabolic activity SUV max equal 14.8.

There are 2 hypermetabolic nodules within the RIGHT inguinal canal
measuring 9 mm (image 232/3) and 9 mm ( image 220).

No abnormal activity in the liver.  No upper abdominal adenopathy.

Incidental CT findings: Large benign cyst of the RIGHT kidney
prostate enlarged

SKELETON: No focal hypermetabolic activity to suggest skeletal
metastasis.

Incidental CT findings: none
IMPRESSION: 1. Irregular hypermetabolic mass and hypermetabolic enlarged lymph
nodes in the ileocecal mesentery consistent with local colorectal
carcinoma recurrence and metastatic adenopathy.
2. Intensely hypermetabolic mass in the a deep peritoneal space
posterior the bladder consistent with nodular peritoneal metastasis.
3. Two nodulular peritoneal metastasis within the RIGHT inguinal
canal.
4. Suspicious 4 mm nodule in the LEFT upper lobe. Stable over recent
CT exams but new from February 2019.

## 2024-01-08 IMAGING — CT CT CHEST-ABD-PELV W/ CM
2 of 5 series · 13 of 46 positions shown, 15 images · IV contrast (APPLIED)
Comparison: 05/12/2021

CLINICAL DATA: Metastatic colon cancer. Restaging. * Tracking Code:
BO *

EXAM:
CT CHEST, ABDOMEN, AND PELVIS WITH CONTRAST
TECHNIQUE: Multidetector CT imaging of the chest, abdomen and pelvis was
performed following the standard protocol during bolus
administration of intravenous contrast.

[Series 2: cap with · axial · 0.71mm/px · z∈[+1034,+1574]mm · 10 of 128 slices shown, 12 images]
[im 10/128  soft-tissue]
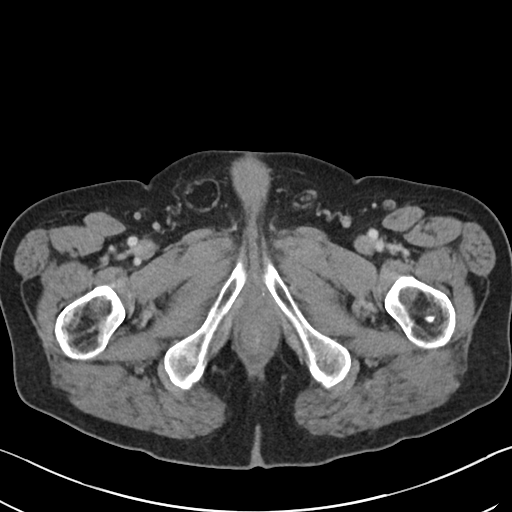
[im 10/128  bone]
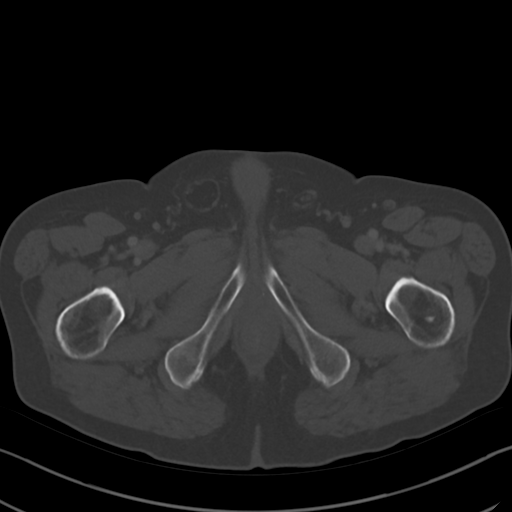
[im 20/128  soft-tissue]
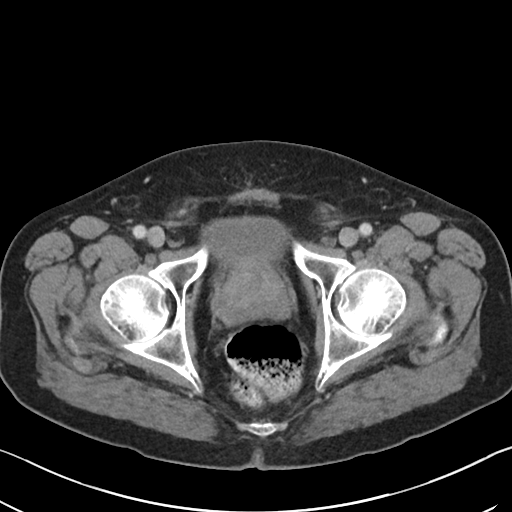
[im 40/128  soft-tissue]
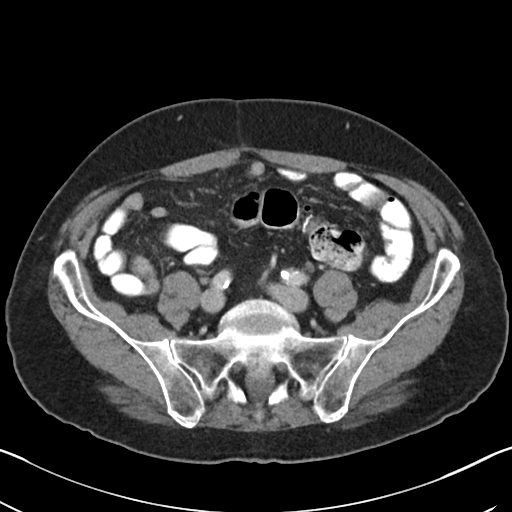
[im 49/128  soft-tissue]
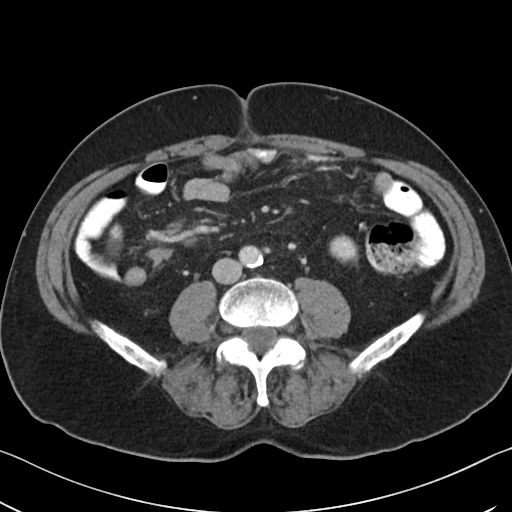
[im 59/128  soft-tissue]
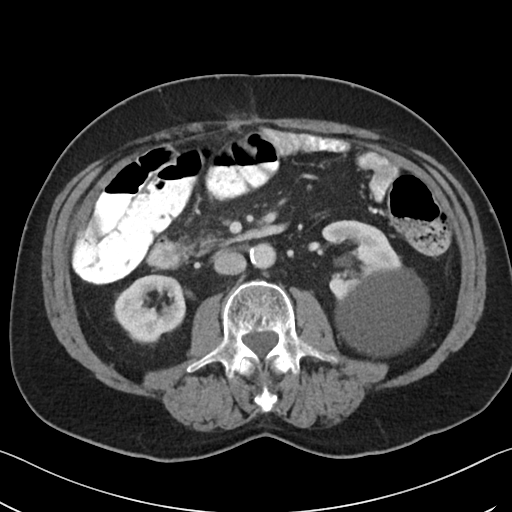
[im 69/128  soft-tissue]
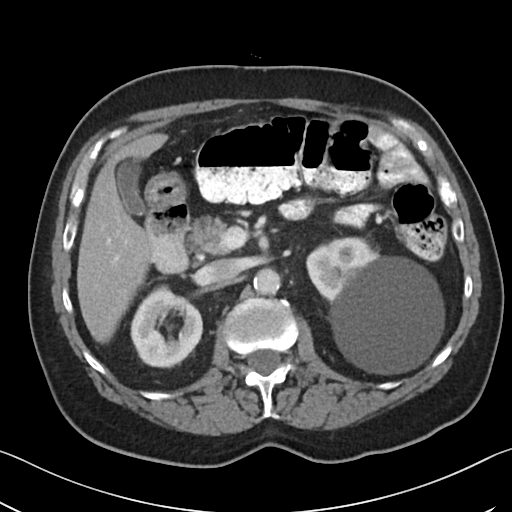
[im 79/128  soft-tissue]
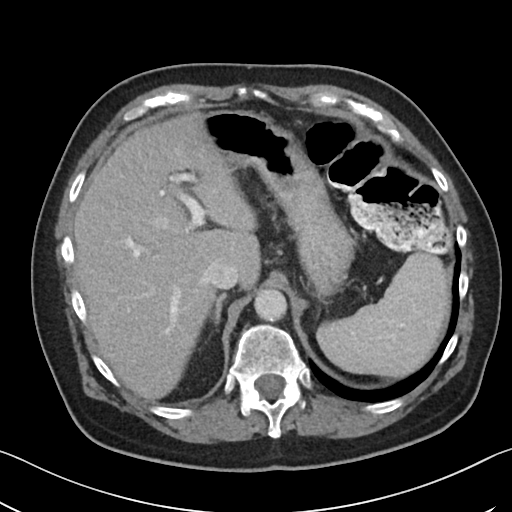
[im 98/128  soft-tissue]
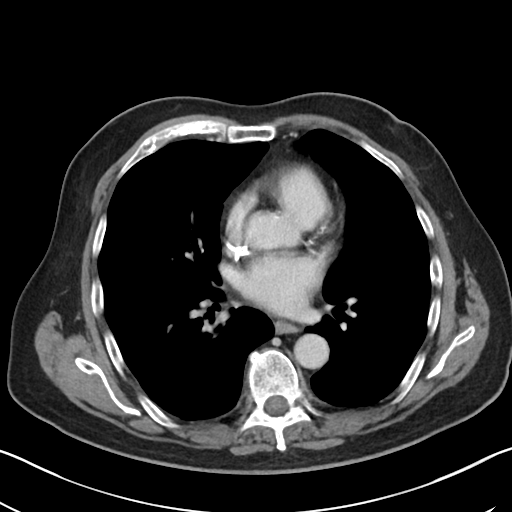
[im 108/128  soft-tissue]
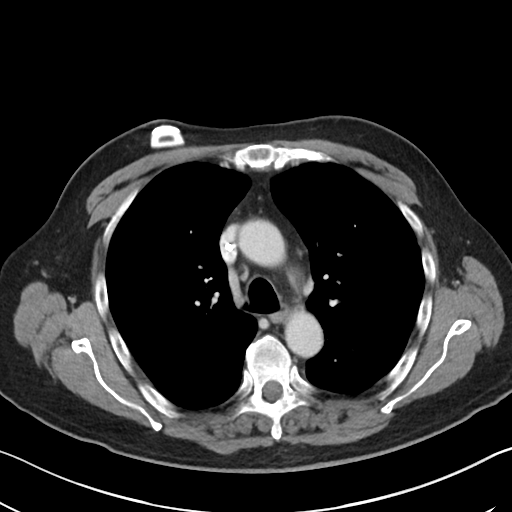
[im 108/128  bone]
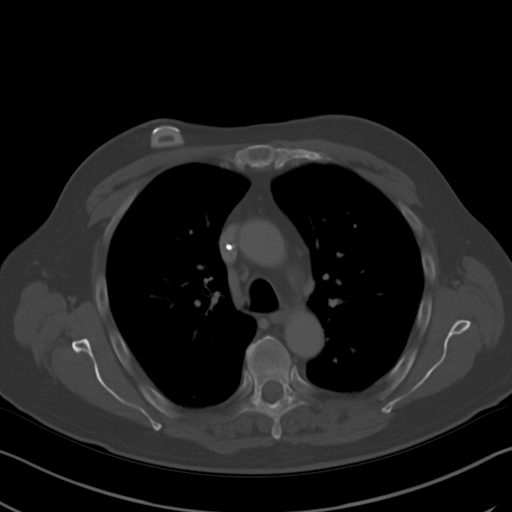
[im 118/128  soft-tissue]
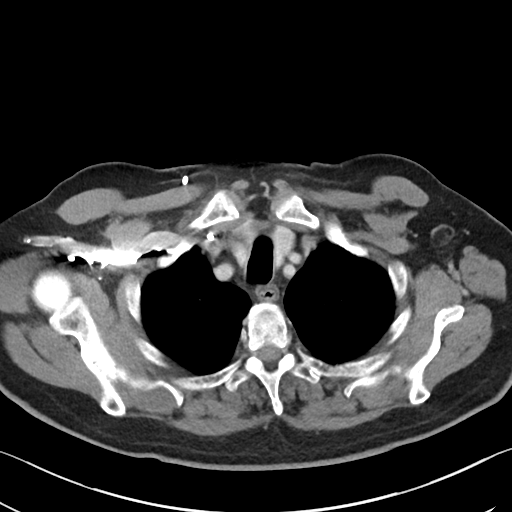

[Series 5: coronal · coronal · 0.82mm/px · 3 of 99 slices shown]
[im 33/99  soft-tissue]
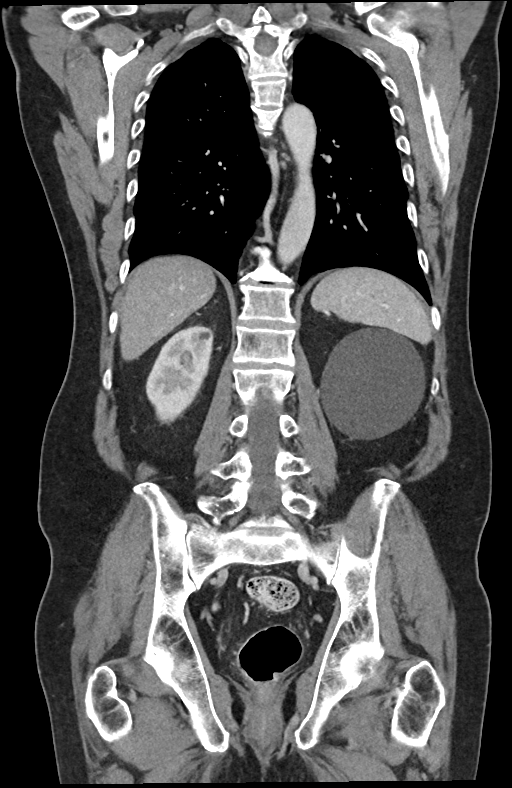
[im 44/99  soft-tissue]
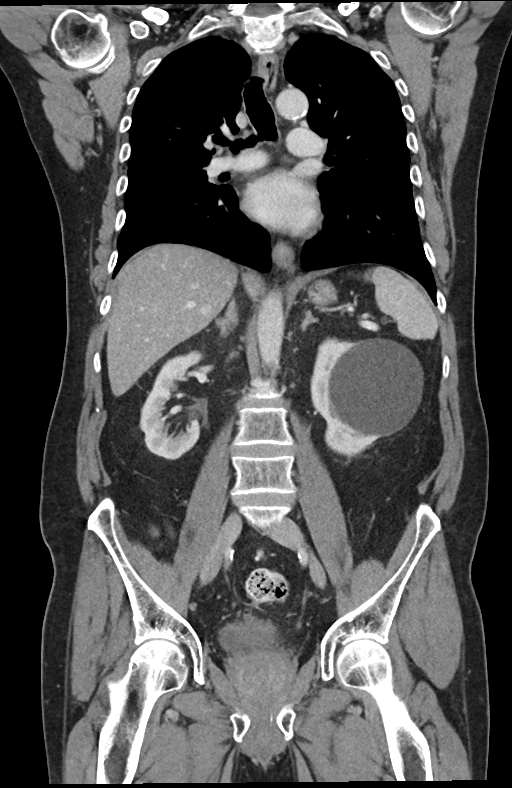
[im 55/99  soft-tissue]
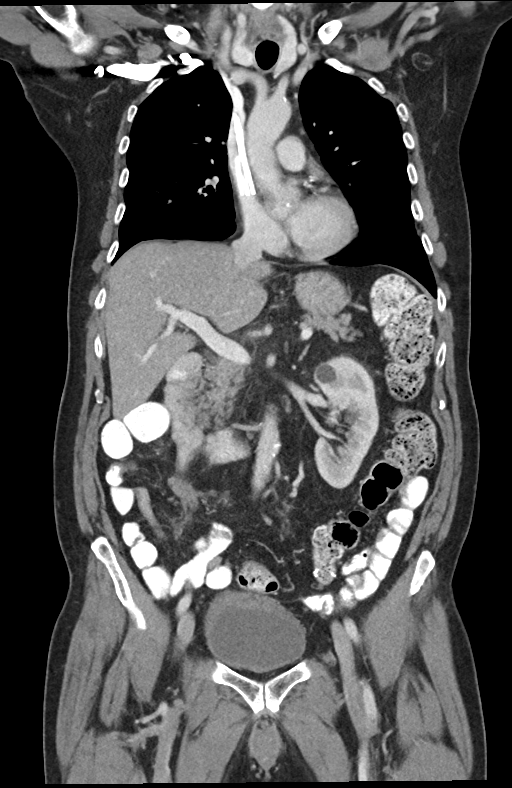

[13 of 46 positions shown; findings below may reference images not displayed]

RADIATION DOSE REDUCTION: This exam was performed according to the
departmental dose-optimization program which includes automated
exposure control, adjustment of the mA and/or kV according to
patient size and/or use of iterative reconstruction technique.

CONTRAST:  85mL OMNIPAQUE IOHEXOL 300 MG/ML  SOLN
FINDINGS: CT CHEST FINDINGS

Cardiovascular: The heart size is normal. No substantial pericardial
effusion. Coronary artery calcification is evident. Mild
atherosclerotic calcification is noted in the wall of the thoracic
aorta. Right Port-A-Cath tip is positioned in the distal SVC.

Mediastinum/Nodes: No mediastinal lymphadenopathy. There is no hilar
lymphadenopathy. The esophagus has normal imaging features. There is
no axillary lymphadenopathy.

Lungs/Pleura: Calcified granuloma noted right middle lobe. 8 mm left
upper lobe pulmonary nodule identified as enlarging previously has
decreased in the interval measuring 5 mm on image 57/4. The 4 mm
nodule identified as new in the posterior left upper lobe previously
has resolved in the interval. No new suspicious pulmonary nodule or
mass. No focal airspace consolidation. No pleural effusion.

Musculoskeletal: No worrisome lytic or sclerotic osseous
abnormality. Stable mild superior endplate compression deformity at
T5.

CT ABDOMEN PELVIS FINDINGS

Hepatobiliary: No suspicious focal abnormality within the liver
parenchyma. Tiny hypodensity in the dome of the liver (42/2 is
stable as is the tiny hypodensity in the posterior left hepatic lobe
on 47/2. Tiny calcified gallstones evident. No intrahepatic or
extrahepatic biliary dilation.

Pancreas: No focal mass lesion. No dilatation of the main duct. No
intraparenchymal cyst. No peripancreatic edema.

Spleen: No splenomegaly. No focal mass lesion.

Adrenals/Urinary Tract: No adrenal nodule or mass. 2 mm
nonobstructing stone identified interpolar right kidney. No
substantial change 8.8 cm cyst upper interpolar left kidney with
additional smaller hypoattenuating lesions in the left kidney also
stable. No evidence for hydroureter. The urinary bladder appears
normal for the degree of distention.

Stomach/Bowel: Stomach is nondistended. Duodenum is normally
positioned as is the ligament of Treitz. No small bowel wall
thickening. No small bowel dilatation. Status post right
hemicolectomy. A new area of focal wall thickening is seen in the
distal transverse colon (see axial image 62 of series 2 and sagittal
image 91 of series 6). This may reflect some redundant wall in the
colon, but warrants attention on follow-up imaging. Sigmoid
anastomosis evident.

Vascular/Lymphatic: There is mild atherosclerotic calcification of
the abdominal aorta without aneurysm. No gastrohepatic or
hepatoduodenal ligament lymphadenopathy. The 3.9 x 2.1 cm irregular
collection of soft tissue identified in the right mesentery
previously is less confluent today (image 82/2) with 2 relatively
discrete nodular areas visible today No pelvic sidewall
lymphadenopathy. measuring 2.2 x 1.3 cm and 1.8 x 0.9 cm,
respectively.

Reproductive: Prostate gland is enlarged.

Other: No intraperitoneal free fluid.

Musculoskeletal: Small right groin hernia contains only fat. Tiny
fat containing supraumbilical midline ventral hernia evident. No
worrisome lytic or sclerotic osseous abnormality.
IMPRESSION: 1. Continued further decrease in size of the right mesenteric
nodular soft tissue lesion, now less confluent in composed of 2
smaller nodular components.
2. The lobulated 8 mm left upper lobe pulmonary nodule described as
enlarging on the previous study has decreased to 5 mm in the
interval. A second 4 mm nodule identified is new in the left upper
lobe on the previous exam has resolved completely.
3. There is a new focal area of apparent wall thickening along the
distal transverse colon, potentially related to
underdistention/mucosal fold. This area warrants close attention on
follow-up imaging.
4.  Aortic Atherosclerois ([id number])
